# Patient Record
Sex: Female | Born: 1950 | Race: White | Hispanic: No | Smoking: Former smoker
Health system: Southern US, Community
[De-identification: ages and names within clinical notes are randomized; demographics above are authoritative.]

## PROBLEM LIST (undated history)

## (undated) DIAGNOSIS — C801 Malignant (primary) neoplasm, unspecified: Secondary | ICD-10-CM

## (undated) DIAGNOSIS — N2889 Other specified disorders of kidney and ureter: Secondary | ICD-10-CM

## (undated) DIAGNOSIS — I10 Essential (primary) hypertension: Secondary | ICD-10-CM

## (undated) DIAGNOSIS — E785 Hyperlipidemia, unspecified: Secondary | ICD-10-CM

## (undated) DIAGNOSIS — Z905 Acquired absence of kidney: Secondary | ICD-10-CM

## (undated) DIAGNOSIS — N289 Disorder of kidney and ureter, unspecified: Secondary | ICD-10-CM

## (undated) DIAGNOSIS — G6181 Chronic inflammatory demyelinating polyneuritis: Secondary | ICD-10-CM

## (undated) DIAGNOSIS — G610 Guillain-Barre syndrome: Secondary | ICD-10-CM

## (undated) HISTORY — DX: Malignant (primary) neoplasm, unspecified: C80.1

## (undated) HISTORY — DX: Hyperlipidemia, unspecified: E78.5

## (undated) HISTORY — DX: Essential (primary) hypertension: I10

## (undated) HISTORY — PX: NEPHRECTOMY: SHX65

## (undated) HISTORY — DX: Chronic inflammatory demyelinating polyneuritis: G61.81

## (undated) HISTORY — DX: Guillain-Barre syndrome: G61.0

## (undated) HISTORY — DX: Acquired absence of kidney: Z90.5

## (undated) HISTORY — DX: Other specified disorders of kidney and ureter: N28.89

## (undated) HISTORY — PX: CHOLECYSTECTOMY: SHX55

---

## 2002-10-20 ENCOUNTER — Encounter: Admission: RE | Admit: 2002-10-20 | Discharge: 2003-01-18 | Payer: Self-pay | Admitting: Emergency Medicine

## 2008-02-18 ENCOUNTER — Emergency Department (HOSPITAL_COMMUNITY): Admission: EM | Admit: 2008-02-18 | Discharge: 2008-02-18 | Payer: Self-pay | Admitting: Emergency Medicine

## 2008-08-14 ENCOUNTER — Emergency Department (HOSPITAL_BASED_OUTPATIENT_CLINIC_OR_DEPARTMENT_OTHER): Admission: EM | Admit: 2008-08-14 | Discharge: 2008-08-15 | Payer: Self-pay | Admitting: Emergency Medicine

## 2009-10-03 ENCOUNTER — Encounter: Admission: RE | Admit: 2009-10-03 | Discharge: 2009-10-03 | Payer: Self-pay | Admitting: Orthopedic Surgery

## 2009-10-07 ENCOUNTER — Encounter: Payer: Self-pay | Admitting: Family Medicine

## 2009-10-19 ENCOUNTER — Ambulatory Visit: Payer: Self-pay | Admitting: Family Medicine

## 2009-10-19 DIAGNOSIS — Z780 Asymptomatic menopausal state: Secondary | ICD-10-CM | POA: Insufficient documentation

## 2009-10-19 DIAGNOSIS — R011 Cardiac murmur, unspecified: Secondary | ICD-10-CM

## 2009-10-19 DIAGNOSIS — E785 Hyperlipidemia, unspecified: Secondary | ICD-10-CM | POA: Insufficient documentation

## 2009-10-19 DIAGNOSIS — I10 Essential (primary) hypertension: Secondary | ICD-10-CM | POA: Insufficient documentation

## 2009-10-20 ENCOUNTER — Telehealth: Payer: Self-pay | Admitting: Family Medicine

## 2009-10-21 ENCOUNTER — Telehealth (INDEPENDENT_AMBULATORY_CARE_PROVIDER_SITE_OTHER): Payer: Self-pay | Admitting: *Deleted

## 2009-10-24 ENCOUNTER — Ambulatory Visit: Payer: Self-pay | Admitting: Family Medicine

## 2009-10-24 ENCOUNTER — Telehealth (INDEPENDENT_AMBULATORY_CARE_PROVIDER_SITE_OTHER): Payer: Self-pay | Admitting: *Deleted

## 2009-10-27 ENCOUNTER — Telehealth (INDEPENDENT_AMBULATORY_CARE_PROVIDER_SITE_OTHER): Payer: Self-pay | Admitting: *Deleted

## 2009-10-28 LAB — CONVERTED CEMR LAB
AST: 19 units/L (ref 0–37)
Alkaline Phosphatase: 70 units/L (ref 39–117)
BUN: 20 mg/dL (ref 6–23)
Basophils Absolute: 0 10*3/uL (ref 0.0–0.1)
Basophils Relative: 0.6 % (ref 0.0–3.0)
Bilirubin, Direct: 0.2 mg/dL (ref 0.0–0.3)
CO2: 28 meq/L (ref 19–32)
Chloride: 96 meq/L (ref 96–112)
Cholesterol: 215 mg/dL — ABNORMAL HIGH (ref 0–200)
Creatinine, Ser: 0.6 mg/dL (ref 0.4–1.2)
Eosinophils Absolute: 0.1 10*3/uL (ref 0.0–0.7)
Glucose, Bld: 187 mg/dL — ABNORMAL HIGH (ref 70–99)
HCT: 38.8 % (ref 36.0–46.0)
Hemoglobin: 13.7 g/dL (ref 12.0–15.0)
Lymphs Abs: 2.4 10*3/uL (ref 0.7–4.0)
MCHC: 35.4 g/dL (ref 30.0–36.0)
MCV: 93.2 fL (ref 78.0–100.0)
Neutro Abs: 5.1 10*3/uL (ref 1.4–7.7)
RBC: 4.16 M/uL (ref 3.87–5.11)
RDW: 13.4 % (ref 11.5–14.6)
Total Bilirubin: 1 mg/dL (ref 0.3–1.2)
Total CHOL/HDL Ratio: 5

## 2009-11-07 ENCOUNTER — Encounter: Payer: Self-pay | Admitting: Family Medicine

## 2009-11-07 ENCOUNTER — Ambulatory Visit (HOSPITAL_COMMUNITY): Admission: RE | Admit: 2009-11-07 | Discharge: 2009-11-07 | Payer: Self-pay | Admitting: Family Medicine

## 2009-11-07 ENCOUNTER — Ambulatory Visit: Payer: Self-pay | Admitting: Internal Medicine

## 2009-11-07 ENCOUNTER — Ambulatory Visit: Payer: Self-pay

## 2009-11-16 ENCOUNTER — Encounter: Admission: RE | Admit: 2009-11-16 | Discharge: 2009-11-16 | Payer: Self-pay | Admitting: Urology

## 2009-11-16 ENCOUNTER — Encounter: Payer: Self-pay | Admitting: Family Medicine

## 2009-11-21 ENCOUNTER — Ambulatory Visit (HOSPITAL_COMMUNITY): Admission: RE | Admit: 2009-11-21 | Discharge: 2009-11-21 | Payer: Self-pay | Admitting: Interventional Radiology

## 2009-12-05 ENCOUNTER — Telehealth: Payer: Self-pay | Admitting: Family Medicine

## 2009-12-13 ENCOUNTER — Ambulatory Visit (HOSPITAL_COMMUNITY): Admission: RE | Admit: 2009-12-13 | Discharge: 2009-12-13 | Payer: Self-pay | Admitting: Urology

## 2009-12-13 ENCOUNTER — Encounter: Payer: Self-pay | Admitting: Family Medicine

## 2009-12-15 ENCOUNTER — Observation Stay (HOSPITAL_COMMUNITY): Admission: RE | Admit: 2009-12-15 | Discharge: 2009-12-16 | Payer: Self-pay | Admitting: Urology

## 2009-12-15 ENCOUNTER — Encounter (INDEPENDENT_AMBULATORY_CARE_PROVIDER_SITE_OTHER): Payer: Self-pay | Admitting: Urology

## 2010-01-02 ENCOUNTER — Encounter (INDEPENDENT_AMBULATORY_CARE_PROVIDER_SITE_OTHER): Payer: Self-pay | Admitting: Urology

## 2010-01-02 ENCOUNTER — Encounter: Payer: Self-pay | Admitting: Urology

## 2010-01-02 ENCOUNTER — Inpatient Hospital Stay (HOSPITAL_COMMUNITY): Admission: RE | Admit: 2010-01-02 | Discharge: 2010-01-14 | Payer: Self-pay | Admitting: Urology

## 2010-01-20 ENCOUNTER — Telehealth: Payer: Self-pay | Admitting: Family Medicine

## 2010-01-25 ENCOUNTER — Encounter: Payer: Self-pay | Admitting: Family Medicine

## 2010-01-25 ENCOUNTER — Telehealth: Payer: Self-pay | Admitting: Family Medicine

## 2010-01-26 ENCOUNTER — Ambulatory Visit: Payer: Self-pay | Admitting: Family Medicine

## 2010-01-26 DIAGNOSIS — R112 Nausea with vomiting, unspecified: Secondary | ICD-10-CM | POA: Insufficient documentation

## 2010-01-26 DIAGNOSIS — G589 Mononeuropathy, unspecified: Secondary | ICD-10-CM | POA: Insufficient documentation

## 2010-01-26 DIAGNOSIS — IMO0002 Reserved for concepts with insufficient information to code with codable children: Secondary | ICD-10-CM | POA: Insufficient documentation

## 2010-01-27 ENCOUNTER — Telehealth: Payer: Self-pay | Admitting: Family Medicine

## 2010-01-27 ENCOUNTER — Ambulatory Visit: Payer: Self-pay | Admitting: Family Medicine

## 2010-01-28 ENCOUNTER — Emergency Department (HOSPITAL_COMMUNITY)
Admission: EM | Admit: 2010-01-28 | Discharge: 2010-01-28 | Payer: Self-pay | Source: Home / Self Care | Admitting: Emergency Medicine

## 2010-01-28 ENCOUNTER — Inpatient Hospital Stay (HOSPITAL_COMMUNITY)
Admission: EM | Admit: 2010-01-28 | Discharge: 2010-02-09 | Payer: Self-pay | Attending: Pediatrics | Admitting: Pediatrics

## 2010-01-29 ENCOUNTER — Encounter (INDEPENDENT_AMBULATORY_CARE_PROVIDER_SITE_OTHER): Payer: Self-pay | Admitting: Neurology

## 2010-01-30 ENCOUNTER — Telehealth: Payer: Self-pay | Admitting: Family Medicine

## 2010-02-28 ENCOUNTER — Encounter: Payer: Self-pay | Admitting: Family Medicine

## 2010-03-05 ENCOUNTER — Encounter: Payer: Self-pay | Admitting: Family Medicine

## 2010-03-05 NOTE — Discharge Summary (Signed)
Nicole Strong, Nicole Strong                ACCOUNT NO.:  [id number]  MEDICAL RECORD NO.:  [id number]          PATIENT TYPE:  INP  LOCATION:  3001                         FACILITY:  [facility]  PHYSICIAN:  Princess Bruins. Hickling, M.D.DATE OF BIRTH:  08-11-1950  DATE OF ADMISSION:  01/28/2010 DATE OF DISCHARGE:  02/09/2010                              DISCHARGE SUMMARY   ADMITTING DIAGNOSES:  Bilateral upper and lower extremity weakness.  DISCHARGE DIAGNOSIS:  Guillain-Barre.  HISTORY OF PRESENT ILLNESS:  This is a 60 year old female who underwent a nephrectomy on January 02, 2010.  After the patient's nephrectomy, the patient reported that she was unable to keep down food and therefore was not eating.  After she returned having the ability to eat normally, the patient noted that she was experiencing numbness in the soles of her feet which over the next few days progressed up to her knees and bilateral hands.  At that time, there was no bowel or bladder dysfunction.  Initially, the patient was able to ambulate while holding on to objects at home, however, by the time the patient was admitted to the hospital, she was using a walker.  The patient was initially seen in the emergency department by Dr. Doy Mince; the on-call neurologist.  The patient was admitted to the hospital for further workup for possible Guillain-Barre.  PAST MEDICAL HISTORY:  Includes diabetes, renal cell carcinoma status post nephrectomy, resection was felt to be total, bladder lesion with the patient receiving chemotherapy within the bladder as a one-time treatment, anxiety, arthritis, depression, GERD, and hypertension.  ALLERGIES:  CODEINE, VICODIN, BIAXIN, REGLAN, and PREDNISONE.  HOSPITAL COURSE:  After the patient was admitted, the patient underwent MRI of brain which showed no acute infarct.  MRI of C-spine showing no focal course taking no abnormality or enhancement nor evidence of bony destructive lesions to  suggest presence of metastatic disease.  MRI of thoracic spine showed no focal cord signal abnormality or enhancement.  The patient then underwent a lumbar puncture to further diagnose the patient's Guillain-Barre.  Findings of lumbar puncture showed a glucose of 105, total protein of 106.  CSF culture was negative for any growth, negative for any fungal growth, and in addition a negative AFB with smear.  CSF color was clear with 4 white blood cells, 614 red blood cells in tube #1.  The patient at that time was started on plasma exchange for a total of 5 doses.  A plasmapheresis catheter was placed and the patient underwent 5 total doses of plasma exchange without any difficulty.  During hospital stay, the patient also noted that she was having some paresthesias and hypesthesias of her lower extremities including her ankle and soles of her feet and was started on Neurontin 100 mg t.i.d.  Today's date is February 09, 2010 and the patient has finished all 5 doses of plasma exchange.  The patient continues to feel as though she is weak in all four extremities and has decreased sensation from bilateral toes to bilateral knees, bilateral fingers up to bilateral wrists.  She also states she has decreased sensation from the T4 to approximately the  T10 region of her thoracic and abdomen region.  She feels as though this sensation has however improved.  PHYSICAL EXAMINATION:  At the time of discharge, the patient shows cranial nerves II through XII grossly intact.  Pupils are equal, round, and reactive to light.  Visual fields are grossly intact.  Speech is clear.  The patient shows 4/5 strength bilateral deltoid, bilateral biceps, and triceps.  Bilateral wrist extension and flexion, grip shows 4+ strength bilaterally.  Hip flexion bilaterally is at 3/5 and a 4/5 hip abduction and adduction.  Knee extension on the right is 4+/5, on the left it is approximately 4-/5.  Dorsiflexion and plantar  flexion on the right are 4/5, on the left 4-/5.  Finger-to-nose smooth, heel-to- shin was smooth, but was difficult to obtain due to the patient's weakness.  The patient states that she has paresthesias bilateral plantar surfaces of her feet, dorsum of her feet extending to her knees. She also states decreased sensation from the region of T4 to approximately T10.  In addition, decreased sensation in bilateral fingers to bilateral wrists.  The patient has 1+ reflexes at the bilateral biceps, triceps and brachioradialis.  She is areflexic at her patella tendon and Achilles.  DISCHARGE LABS:  Last CBC was done on December 25 showing white blood cell count of 9.2, hemoglobin 9.8, hematocrit 28.6, platelet of 252. Sodium 136, potassium 4.1, chloride of 104, CO2 of 25, glucose 146, BUN 21, creatinine 0.90.  IMAGING:  As stated above.  DISCHARGE MEDICATIONS: 1. Triamterene and hydrochlorothiazide 37.5/25 mg 1 tablet q.a.m. 2. Lexapro 10 mg 1 tablet p.o. q.a.m. 3. Evista 60 mg 1 tablet p.o. q.a.m. 4. Simvastatin 20 mg 1 tablet p.o. q.a.m. 5. Calcium over-the-counter 2 tablets p.o. q.a.m. 6. Neurontin 100 mg p.o. t.i.d. 7. Nu-Iron 150 mg daily.  FOLLOW UP:  The patient is to followup at [facility] in approximately 2-3 weeks for further evaluation.     Etta Quill, PA-C   ______________________________ Princess Bruins. Gaynell Face, M.D.    DS/MEDQ  D:  02/09/2010  T:  02/09/2010  Job:  [id number]  Electronically Signed by Etta Quill PA-C on 02/14/2010 02:55:01 PM Electronically Signed by Wyline Copas M.D. on 03/05/2010 11:47:31 PM

## 2010-03-13 DIAGNOSIS — G6181 Chronic inflammatory demyelinating polyneuritis: Secondary | ICD-10-CM | POA: Insufficient documentation

## 2010-03-14 NOTE — Letter (Signed)
Summary: [facility]  [facility]   Imported By: Edmonia James 10/18/2009 12:59:57  _____________________________________________________________________  External Attachment:    Type:   Image     Comment:   External Document

## 2010-03-14 NOTE — Progress Notes (Signed)
Summary: numbness in feet and hand  Phone Note Call from Patient Call back at Home Phone [phone]   Caller: Patient Summary of Call: Pt states that she was in [facility] for nephrectomy on left side. Pt now c/o constant numbness in feet and right index, 3rd, and 4th fingers. Pt states that she has only been home for a few days and has contacted surgeon about issue today. Pt is waiting on return call from them but was also advise to make PCP aware of the situation. Pt denies any other symptoms ..........................Marland KitchenFelecia Deloach CMA  January 20, 2010 8:43 AM   Follow-up for Phone Call        It could be from something that occurred during surgery---esp numbness in fingers but if surgeon says it is unrelated---she would need ov here. Follow-up by: Garnet Koyanagi DO,  January 20, 2010 8:53 AM  Additional Follow-up for Phone Call Additional follow up Details #1::        pt aware will call back when she hears from surgeon. Additional Follow-up by: Aron Baba CMA Deborra Medina),  January 20, 2010 8:57 AM

## 2010-03-14 NOTE — Assessment & Plan Note (Signed)
Summary: NEW TO ESTAB/CBS   Vital Signs:  Patient profile:   60 year old female Menstrual status:  postmenopausal Height:      61 inches Weight:      197.4 pounds BMI:     37.43 Temp:     98.9 degrees F oral BP sitting:   138 / 74  (left arm)  Vitals Entered By: Aron Baba CMA Deborra Medina) (October 19, 2009 10:11 AM) CC: CPX/Not Fasting, questions about DM management/ no pap needed     Menstrual Status postmenopausal   History of Present Illness: Pt here to establish care and have cpe. [facility] and is being worked up by urology for renal mass.    Type 1 diabetes mellitus follow-up      This is a 60 year old woman who presents with Type 2 diabetes mellitus follow-up.  The patient denies polyuria, polydipsia, blurred vision, self managed hypoglycemia, hypoglycemia requiring help, weight loss, weight gain, and numbness of extremities.  Other symptoms include neuropathic pain.  The patient denies the following symptoms: chest pain, vomiting, orthostatic symptoms, poor wound healing, intermittent claudication, vision loss, and foot ulcer.  Since the last visit the patient reports poor dietary compliance, noncompliance with medications, not exercising regularly, and not monitoring blood glucose.  Since the last visit, the patient reports having had eye care by an ophthalmologist and foot care by a podiatrist.  Complications from diabetes include peripheral neuropathy.    Preventive Screening-Counseling & Management  Alcohol-Tobacco     Alcohol drinks/day: 0     Smoking Status: quit     Packs/Day: 0.5     Year Started: 1968     Year Quit: 2006  Caffeine-Diet-Exercise     Caffeine use/day: 0     Does Patient Exercise: no  Hep-HIV-STD-Contraception     Dental Visit-last 6 months no     Dental Care Counseling: to seek dental care; no dental care within six months     SBE monthly: no      Sexual History:  divorced.        Drug Use:  never and no.    Current  Medications (verified): 1)  Lisinopril-Hydrochlorothiazide 20-12.5 Mg Tabs (Lisinopril-Hydrochlorothiazide) .Marland Kitchen.. 1 By Mouth At Night 2)  Diclofenac Sodium 50 Mg Tbec (Diclofenac Sodium) .Marland Kitchen.. 1 By Mouth Two Times A Day With Food 3)  Janumet 50-500 Mg Tabs (Sitagliptin-Metformin Hcl) .Marland Kitchen.. 1 By Mouth Two Times A Day 4)  Ambien 5 Mg Tabs (Zolpidem Tartrate) .Marland Kitchen.. 1 By Mouth At Bedtime As Needed  Allergies (verified): 1)  ! Codeine 2)  ! Vicodin  Past History:  Family History: Last updated: 10/19/2009 Sister had Breast CA Father had Prostate Ca Family History Breast cancer 1st degree relative <50 Family History of Prostate CA 1st degree relative <50  Social History: Last updated: 10/19/2009 Occupation:  time Estate agent service Divorced Former Smoker Alcohol use-no Drug use-no Regular exercise-no  Risk Factors: Alcohol Use: 0 (10/19/2009) Caffeine Use: 0 (10/19/2009) Exercise: no (10/19/2009)  Risk Factors: Smoking Status: quit (10/19/2009) Packs/Day: 0.5 (10/19/2009)  Past Medical History: Diabetes mellitus, type II Hyperlipidemia Hypertension renal mass--- being worked up by urology  Past Surgical History: 1983 C-section 1974 Gall bladder removal  Family History: Reviewed history and no changes required. Sister had Breast CA Father had Prostate Ca Family History Breast cancer 1st degree relative <50 Family History of Prostate CA 1st degree relative <50  Social History: Reviewed history and no changes required. Occupation:  time Estate agent service Divorced Former Smoker Alcohol use-no Drug use-no Regular exercise-no Occupation:  employed Smoking Status:  quit Drug Use:  never, no Does Patient Exercise:  no Packs/Day:  0.5 Caffeine use/day:  0 Dental Care w/in 6 mos.:  no Sexual History:  divorced  Review of Systems      See HPI General:  Denies chills, fatigue, fever, loss of appetite, malaise, sleep disorder, sweats, weakness, and weight  loss. Eyes:  Denies blurring, discharge, double vision, eye irritation, eye pain, halos, itching, light sensitivity, red eye, vision loss-1 eye, and vision loss-both eyes. ENT:  Denies decreased hearing, difficulty swallowing, ear discharge, earache, hoarseness, nasal congestion, nosebleeds, postnasal drainage, ringing in ears, sinus pressure, and sore throat. CV:  Denies bluish discoloration of lips or nails, chest pain or discomfort, difficulty breathing at night, difficulty breathing while lying down, fainting, fatigue, leg cramps with exertion, lightheadness, near fainting, palpitations, shortness of breath with exertion, swelling of feet, swelling of hands, and weight gain. Resp:  Denies chest discomfort, chest pain with inspiration, cough, coughing up blood, excessive snoring, hypersomnolence, morning headaches, pleuritic, shortness of breath, sputum productive, and wheezing. GI:  Denies abdominal pain, bloody stools, change in bowel habits, constipation, dark tarry stools, diarrhea, excessive appetite, gas, hemorrhoids, indigestion, loss of appetite, nausea, vomiting, vomiting blood, and yellowish skin color. GU:  Denies abnormal vaginal bleeding, decreased libido, discharge, dysuria, genital sores, hematuria, incontinence, nocturia, urinary frequency, and urinary hesitancy. MS:  Denies joint pain, joint redness, joint swelling, loss of strength, low back pain, mid back pain, muscle aches, muscle , cramps, muscle weakness, stiffness, and thoracic pain. Derm:  Denies changes in color of skin, changes in nail beds, dryness, excessive perspiration, flushing, hair loss, insect bite(s), itching, lesion(s), poor wound healing, and rash. Neuro:  Denies brief paralysis, difficulty with concentration, disturbances in coordination, falling down, headaches, inability to speak, memory loss, numbness, poor balance, seizures, sensation of room spinning, tingling, tremors, visual disturbances, and weakness. Psych:   Denies alternate hallucination ( auditory/visual), anxiety, depression, easily angered, easily tearful, irritability, mental problems, panic attacks, sense of great danger, suicidal thoughts/plans, thoughts of violence, unusual visions or sounds, and thoughts /plans of harming others. Endo:  Denies cold intolerance, excessive hunger, excessive thirst, excessive urination, heat intolerance, polyuria, and weight change. Heme:  Denies abnormal bruising, bleeding, enlarge lymph nodes, fevers, pallor, and skin discoloration. Allergy:  Denies hives or rash, itching eyes, persistent infections, seasonal allergies, and sneezing.  Physical Exam  General:  Well-developed,well-nourished,in no acute distress; alert,appropriate and cooperative throughout examinationoverweight-appearing.   Head:  Normocephalic and atraumatic without obvious abnormalities. No apparent alopecia or balding. Eyes:  vision grossly intact, pupils equal, pupils round, pupils reactive to light, and no injection.   Ears:  External ear exam shows no significant lesions or deformities.  Otoscopic examination reveals clear canals, tympanic membranes are intact bilaterally without bulging, retraction, inflammation or discharge. Hearing is grossly normal bilaterally. Nose:  External nasal examination shows no deformity or inflammation. Nasal mucosa are pink and moist without lesions or exudates. Mouth:  Oral mucosa and oropharynx without lesions or exudates.  Teeth in good repair. Neck:  No deformities, masses, or tenderness noted.no carotid bruits.   Chest Wall:  No deformities, masses, or tenderness noted. Breasts:  No mass, nodules, thickening, tenderness, bulging, retraction, inflamation, nipple discharge or skin changes noted.   Lungs:  Normal respiratory effort, chest expands symmetrically. Lungs are clear to auscultation, no crackles or wheezes. Heart:  normal rate and Grade  2 /6 systolic ejection murmur.   Abdomen:  Bowel sounds  positive,abdomen soft and non-tender without masses, organomegaly or hernias noted. Rectal:  gyn Genitalia:  gyn Msk:  normal ROM, no joint tenderness, no joint swelling, no joint warmth, no redness over joints, no joint deformities, no joint instability, and no crepitation.   Pulses:  R posterior tibial normal, R dorsalis pedis normal, R carotid normal, L posterior tibial normal, L dorsalis pedis normal, and L carotid normal.   Extremities:  No clubbing, cyanosis, edema, or deformity noted with normal full range of motion of all joints.   Neurologic:  No cranial nerve deficits noted. Station and gait are normal. Plantar reflexes are down-going bilaterally. DTRs are symmetrical throughout. Sensory, motor and coordinative functions appear intact. Skin:  Intact without suspicious lesions or rashes Cervical Nodes:  No lymphadenopathy noted Axillary Nodes:  No palpable lymphadenopathy Psych:  Cognition and judgment appear intact. Alert and cooperative with normal attention span and concentration. No apparent delusions, illusions, hallucinations  Diabetes Management Exam:    Foot Exam (with socks and/or shoes not present):       Sensory-Pinprick/Light touch:          Left medial foot (L-4): normal          Left dorsal foot (L-5): normal          Left lateral foot (S-1): normal          Right medial foot (L-4): normal          Right dorsal foot (L-5): normal          Right lateral foot (S-1): normal       Sensory-Monofilament:          Left foot: normal          Right foot: normal       Inspection:          Left foot: normal          Right foot: normal       Nails:          Left foot: normal          Right foot: normal    Eye Exam:       Eye Exam done elsewhere   Impression & Recommendations:  Problem # 1:  PREVENTIVE HEALTH CARE (ICD-V70.0)  Orders: Gastroenterology Referral (GI) Radiology Referral (Radiology) EKG w/ Interpretation (93000)  Problem # 2:  POSTMENOPAUSAL STATUS  (ICD-V49.81)  Orders: Radiology Referral (Radiology) EKG w/ Interpretation (93000)  Problem # 3:  CARDIAC MURMUR (ICD-785.2)  Orders: Echo Referral (Echo) EKG w/ Interpretation (93000)  Problem # 4:  HYPERTENSION (ICD-401.9)  Her updated medication list for this problem includes:    Lisinopril-hydrochlorothiazide 20-12.5 Mg Tabs (Lisinopril-hydrochlorothiazide) .Marland Kitchen... 1 by mouth at night  Orders: Echo Referral (Echo) EKG w/ Interpretation (93000)  Problem # 5:  HYPERLIPIDEMIA (ICD-272.4)  Orders: EKG w/ Interpretation (93000)  Problem # 6:  DIABETES MELLITUS, TYPE II (ICD-250.00)  Her updated medication list for this problem includes:    Lisinopril-hydrochlorothiazide 20-12.5 Mg Tabs (Lisinopril-hydrochlorothiazide) .Marland Kitchen... 1 by mouth at night    Janumet 50-500 Mg Tabs (Sitagliptin-metformin hcl) .Marland Kitchen... 1 by mouth two times a day  Orders: EKG w/ Interpretation (93000)  Problem # 7:  CARDIAC MURMUR (ICD-785.2)  Orders: Echo Referral (Echo) EKG w/ Interpretation (93000)  Complete Medication List: 1)  Lisinopril-hydrochlorothiazide 20-12.5 Mg Tabs (Lisinopril-hydrochlorothiazide) .Marland Kitchen.. 1 by mouth at night 2)  Diclofenac Sodium 50 Mg Tbec (Diclofenac sodium) .Marland KitchenMarland KitchenMarland Kitchen  1 by mouth two times a day with food 3)  Janumet 50-500 Mg Tabs (Sitagliptin-metformin hcl) .Marland Kitchen.. 1 by mouth two times a day 4)  Ambien 5 Mg Tabs (Zolpidem tartrate) .Marland Kitchen.. 1 by mouth at bedtime as needed 5)  Freestyle Lancets Misc (Lancets) 6)  Freestyle Lite Test Strp (Glucose blood) .... Accu check two times a day  Other Orders: Tdap => 46yrs IM [id number]) Admin 1st Vaccine [phone])  Patient Instructions: 1)  V70.0  250.00  bmp, lipid, hep , hgba1c ,   TSH, cbcd, microalbumin,  733.1  vita D,   UA Prescriptions: FREESTYLE LITE TEST  STRP (GLUCOSE BLOOD) accu check two times a day  #60 x 5   Entered and Authorized by:   Garnet Koyanagi DO   Signed by:   Garnet Koyanagi DO on 10/19/2009   Method used:   Historical    [id number] AMBIEN 5 MG TABS (ZOLPIDEM TARTRATE) 1 by mouth at bedtime as needed  #15 x 0   Entered and Authorized by:   Garnet Koyanagi DO   Signed by:   Garnet Koyanagi DO on 10/19/2009   Method used:   Print then Give to Patient   RxID:   [id number] JANUMET 50-500 MG TABS (SITAGLIPTIN-METFORMIN HCL) 1 by mouth two times a day  #60 x 2   Entered and Authorized by:   Garnet Koyanagi DO   Signed by:   Garnet Koyanagi DO on 10/19/2009   Method used:   Historical   [id number]    EKG  Procedure date:  10/19/2009  Findings:      Normal sinus rhythm with rate of:  93       Immunizations Administered:  Tetanus Vaccine:    Vaccine Type: Tdap    Site: left deltoid    Mfr: Merck    Dose: 0.5 ml    Route: IM    Given by: Aron Baba CMA ([place])    Exp. Date: 11/03/2011    Lot #: [id number]    VIS given: 12/31/06 version given October 19, 2009.

## 2010-03-14 NOTE — Letter (Signed)
Summary: [facility]  [facility]   Imported By: Edmonia James 12/29/2009 10:16:06  _____________________________________________________________________  External Attachment:    Type:   Image     Comment:   External Document

## 2010-03-14 NOTE — Letter (Signed)
Summary: [facility]   Imported By: Edmonia James 12/02/2009 11:46:03  _____________________________________________________________________  External Attachment:    Type:   Image     Comment:   External Document

## 2010-03-14 NOTE — Progress Notes (Signed)
Summary: Results (lmom 9/15, 9/16x2  Phone Note Outgoing Call Call back at [facility] Phone [phone]   Details for Reason: Results Summary of Call: ow vita D----  take vita D 50000 u weekly  and take otc vita D3 2000u daily recheck 3 months  Left message to call back Initial call taken by: Aron Baba CMA Deborra Medina),  October 27, 2009 11:32 AM  Follow-up for Phone Call        [email or web address] Breslow  October 28, 2009 10:35 AM  spoke w/ patient aware of labs and ask about other labs informed that overall cholesterol was elevated and that cholesterol med may be started but would have to wait until Dr. Etter Sjogren to review says that if she is started on cholesterol med that she would like to be started on Welchol to help w/ diarrhea caused by medication and having no gallbladder and since she prefers not to be on any statins would like to know something before the weekend .......Marland KitchenMalachi Bonds CMA  October 28, 2009 2:35 PM   Additional Follow-up for Phone Call Additional follow up Details #1::        see labs DM not controlled but pt just started on janumet. Ideally your LDL (bad cholesterol) should be <__70__, your HDL (good cholesterol) should be >__40_ and your triglycerides should be< 150.  Diet and exercise will increase HDL and decrease the LDL and triglycerides. Read Dr. Langston Masker book--Eat Drink and Be Healthy.  Start welchol tabs 3 by mouth two times a day.  #120  2 refills.    We will recheck labs in __3_ months.  272.4  250.00  hgba1c,  bmp, lipid, hep  Additional Follow-up by: Garnet Koyanagi DO,  October 28, 2009 3:43 PM    Additional Follow-up for Phone Call Additional follow up Details #2::    left message on machine ....Marland KitchenMarland KitchenMalachi Bonds [facility]  October 28, 2009 4:14 PM   Additional Follow-up for Phone Call Additional follow up Details #3:: Details for Additional Follow-up Action Taken: spk with pt adv of rx. called to [facility] on [address]. Aron Baba CMA Deborra Medina)  October 28, 2009 4:31 PM' Additional Follow-up by: Aron Baba CMA Deborra Medina),  October 28, 2009 4:32 PM  New/Updated Medications: VITAMIN D (ERGOCALCIFEROL) 50000 UNIT CAPS (ERGOCALCIFEROL) take one tablet weekly WELCHOL 625 MG TABS (COLESEVELAM HCL) 3 tabs by mouth twice a day Prescriptions: WELCHOL 625 MG TABS (COLESEVELAM HCL) 3 tabs by mouth twice a day  #120 x 2   Entered by:   Aron Baba CMA ([place])   Authorized by:   Garnet Koyanagi DO   Signed by:   Aron Baba CMA ([place]) on 10/28/2009   Method used:   Faxed to ...       [facility]. [phone]* (retail)       [address]       [place], [place]  [address]       Ph: [phone]       Fax: [phone]   RxID:   [id number] VITAMIN D (ERGOCALCIFEROL) 50000 UNIT CAPS (ERGOCALCIFEROL) take one tablet weekly  #12 x 0   Entered by:   Malachi Bonds CMA   Authorized by:   Garnet Koyanagi DO   Signed by:   Malachi Bonds CMA on 10/28/2009   Method used:   Electronically to        [facility]. [phone]* (retail)  [address]       [place], [place]  [address]       Ph: [phone]       Fax: [phone]   RxID:   [id number]

## 2010-03-14 NOTE — Progress Notes (Signed)
Summary: Ambien refill  Phone Note Refill Request Message from:  Fax from Pharmacy on December 05, 2009 9:35 AM  Refills Requested: Medication #1:  AMBIEN 5 MG TABS 1 by mouth at bedtime as needed [facility] [address] - pharmacy didnt receive refill   Initial call taken by: Arbie Co[place],  December 05, 2009 9:35 AM  Follow-up for Phone Call        last filled and seen 10/19/09, Please advise...Marland KitchenMarland KitchenMarland Kitchen Aron Baba CMA Deborra Medina)  December 05, 2009 5:02 PM   Additional Follow-up for Phone Call Additional follow up Details #1::        ok to send refill x1 Additional Follow-up by: Garnet Koyanagi DO,  December 06, 2009 10:50 AM    Prescriptions: AMBIEN 5 MG TABS (ZOLPIDEM TARTRATE) 1 by mouth at bedtime as needed  #15 x 0   Entered by:   Aron Baba CMA ([place])   Authorized by:   Garnet Koyanagi DO   Signed by:   Aron Baba CMA ([place]) on 12/06/2009   Method used:   Printed then faxed to ...       [facility]. [phone]* (retail)       [address]       [place], [place]  [address]       Ph: [phone]       Fax: [phone]   RxID:   [id number]

## 2010-03-14 NOTE — Progress Notes (Signed)
Summary: GASTRO REFERRAL  Phone Note Outgoing Call   Call placed by: Phylliss Bob [facility],  October 20, 2009 9:31 AM Call placed to: Patient Summary of Call: Dare, PER MY PHONE CALL TO PATIENT, SHE HAS DECLINED THIS REFERRAL AT THIS TIME, STATES SHE WILL LET us KNOW WHEN SHE IS READY TO SCHEDULE. Initial call taken by: Phylliss Bob [facility],  October 20, 2009 9:32 AM     Colonoscopy Result Date:  10/19/2009 Colonoscopy Result:  refused

## 2010-03-14 NOTE — Progress Notes (Signed)
Summary: freestyle strips and lancets  Phone Note Refill Request Message from:  Patient on October 24, 2009 8:55 AM  Refills Requested: Medication #1:  FREESTYLE LITE TEST  STRP accu check two times a day.  Medication #2:  FREESTYLE LANCETS  MISC [facility] [address] [facility] [address]   Initial call taken by: Arbie Co[place],  October 24, 2009 8:56 AM    New/Updated Medications: FREESTYLE LANCETS  MISC (LANCETS) accu check two times a day Prescriptions: FREESTYLE LITE TEST  STRP (GLUCOSE BLOOD) accu check two times a day  #60 x 5   Entered by:   Malachi Bonds CMA   Authorized by:   Garnet Koyanagi DO   Signed by:   Malachi Bonds CMA on 10/24/2009   Method used:   Electronically to        [facility]. [phone]* (retail)       [address]       [place], [place]  [address]       Ph: [phone]       Fax: [phone]   RxID:   [id number] FREESTYLE LANCETS  MISC (LANCETS) accu check two times a day  #60 x 5   Entered by:   Malachi Bonds CMA   Authorized by:   Garnet Koyanagi DO   Signed by:   Malachi Bonds CMA on 10/24/2009   Method used:   Electronically to        [facility]. [phone]* (retail)       [address]       [place], [place]  [address]       Ph: [phone]       Fax: [phone]   RxID:   [id number]

## 2010-03-14 NOTE — Progress Notes (Signed)
Summary: LISINOPRIL-HCTZ REFILL  Phone Note Refill Request Message from:  Fax from Pharmacy  Refills Requested: Medication #1:  LISINOPRIL-HYDROCHLOROTHIAZIDE 20-12.5 MG TABS 1 by mouth at night   Last Refilled: 09/12/2009 [facility] [address] [address]  [phone]    QTY = 30  Initial call taken by: Berneta Sages,  October 21, 2009 9:28 AM  Follow-up for Phone Call        Last OV 10/19/09 [place] pt, Rx never filled by you. Please advise Follow-up by: Aron Baba CMA Deborra Medina),  October 21, 2009 12:10 PM  Additional Follow-up for Phone Call Additional follow up Details #1::        refill for 3 months Additional Follow-up by: Garnet Koyanagi DO,  October 21, 2009 12:55 PM    Prescriptions: LISINOPRIL-HYDROCHLOROTHIAZIDE 20-12.5 MG TABS (LISINOPRIL-HYDROCHLOROTHIAZIDE) 1 by mouth at night  #30 x 3   Entered by:   Malachi Bonds CMA   Authorized by:   Garnet Koyanagi DO   Signed by:   Malachi Bonds CMA on 10/21/2009   Method used:   Electronically to        [facility]. [phone]* (retail)       [address]       [place], [place]  [address]       Ph: [phone]       Fax: [phone]   RxID:   [id number]

## 2010-03-15 ENCOUNTER — Encounter: Payer: Self-pay | Admitting: Family Medicine

## 2010-03-16 NOTE — Letter (Signed)
Summary: [facility]  [facility]   Imported By: Edmonia James 02/03/2010 12:46:23  _____________________________________________________________________  External Attachment:    Type:   Image     Comment:   External Document

## 2010-03-16 NOTE — Progress Notes (Signed)
Summary: [facility] Patient Discuss Patient care  Phone Note From Other Clinic Call back at [phone] ext [phone] Caren Griffins or Nira Conn)   Caller: Dr Alinda Money [facility]) Summary of Call: Would like a call to discuss care of mutual patient................Marland KitchenFelecia Deloach CMA  January 25, 2010 2:40 PM  heather ext [phone] or Caren Griffins ext [phone]  Left detail message on Dr Alinda Money nurse(Cynthia) VM that dr Etter Sjogren out of office this afternoon and will not return until AM, however if it is a urgent matter he can give [place] a call back and speak with one of her colleague, otherwise I will forward to Dr Etter Sjogren so that she can address it on tomorrow.......Marland KitchenFelecia Deloach CMA  January 25, 2010 3:00 PM   Follow-up for Phone Call        Dr Alinda Money call back stating that he saw Pt today and does not feel that her symptoms are related to surgery. He suggests that Pt needs to be seen this week for increase in neuropathy.  Advised Dr Alinda Money will forward info to Dr Etter Sjogren and will be in touch if any further info is needed from him. Pt has pending OV schedule for Friday with dr Etter Sjogren.Marland KitchenMarland KitchenMarland KitchenFelecia Deloach CMA  January 25, 2010 3:56 PM

## 2010-03-16 NOTE — Progress Notes (Signed)
Summary: referral  Phone Note Call from Patient Call back at Home Phone [phone]   Caller: Patient Summary of Call: Pt states that she was seen by Ortho on yesterday and they informed her for the issue she is having she would need to see neurology. Pt states that she has already contacted them and was advised that she needed a referral and they are scheduling for March. Pt states that she can not wait until March she needs to be seen sooner. Pls Advise.............Marland KitchenFelecia Deloach CMA  January 27, 2010 2:26 PM   Follow-up for Phone Call        we will work on it Follow-up by: Garnet Koyanagi DO,  January 27, 2010 2:39 PM  Additional Follow-up for Phone Call Additional follow up Details #1::        Patient referred to [facility], she will call me for an ASAP appt for pt as soon as she has a cancellation. Phylliss Bob [facility]  January 27, 2010 2:49 PM     Additional Follow-up for Phone Call Additional follow up Details #2::    Please let pt know Follow-up by: Garnet Koyanagi DO,  January 27, 2010 2:59 PM  Additional Follow-up for Phone Call Additional follow up Details #3:: Details for Additional Follow-up Action Taken: Pt aware awaiting appt info.......Marland KitchenFelecia Deloach CMA  January 27, 2010 3:57 PM

## 2010-03-16 NOTE — Assessment & Plan Note (Signed)
Summary: nausea/cbs   Vital Signs:  Patient profile:   60 year old female Menstrual status:  postmenopausal Weight:      188.0 pounds Temp:     98.0 degrees F oral Pulse rate:   76 / minute Pulse rhythm:   regular BP sitting:   120 / 72  (left arm) Cuff size:   regular  Vitals Entered By: Aron Baba CMA Deborra Medina) (January 26, 2010 11:43 AM) CC: c/o nausea and vomiting since this morning   History of Present Illness: Pt here c/o n/v since this am.   Last episode vomiting 9am but she is still nauseous.  Pt is doing well from nephrectomy and has no abd pain.   Pt fell secondary to increasing neuropathy in LE---she can't feel her feet  and she now has numbness in hands as well.  Pt was seeing Dr Eddie Dibbles for her back when they found tumor on kidney so she has not been back since.    Current Medications (verified): 1)  Lisinopril-Hydrochlorothiazide 20-12.5 Mg Tabs (Lisinopril-Hydrochlorothiazide) .Marland Kitchen.. 1 By Mouth At Night 2)  Diclofenac Sodium 50 Mg Tbec (Diclofenac Sodium) .Marland Kitchen.. 1 By Mouth Two Times A Day With Food 3)  Janumet 50-500 Mg Tabs (Sitagliptin-Metformin Hcl) .Marland Kitchen.. 1 By Mouth Two Times A Day 4)  Ambien 5 Mg Tabs (Zolpidem Tartrate) .Marland Kitchen.. 1 By Mouth At Bedtime As Needed 5)  Freestyle Lancets  Misc (Lancets) .... Accu Check Two Times A Day 6)  Freestyle Lite Test  Strp (Glucose Blood) .... Accu Check Two Times A Day 7)  Welchol 625 Mg Tabs (Colesevelam Hcl) .... 3 Tabs By Mouth Twice A Day 8)  Urogesic-Blue 81.6 Mg Tabs (Methen-Hyosc-Meth Blue-Na Phos) .Marland Kitchen.. 1 By Mouth Four Times A Day 9)  Promethazine Hcl 25 Mg Tabs (Promethazine Hcl) .Marland Kitchen.. 1 By Mouth Qid As Needed  Allergies (verified): 1)  ! Codeine 2)  ! Vicodin  Past History:  Past Medical History: Last updated: 10/19/2009 Diabetes mellitus, type II Hyperlipidemia Hypertension renal mass--- being worked up by urology  Family History: Last updated: 10/19/2009 Sister had Breast CA Father had Prostate Ca Family  History Breast cancer 1st degree relative <50 Family History of Prostate CA 1st degree relative <50  Social History: Last updated: 10/19/2009 Occupation:  time Estate agent service Divorced Former Smoker Alcohol use-no Drug use-no Regular exercise-no  Risk Factors: Alcohol Use: 0 (10/19/2009) Caffeine Use: 0 (10/19/2009) Exercise: no (10/19/2009)  Risk Factors: Smoking Status: quit (10/19/2009) Packs/Day: 0.5 (10/19/2009)  Past Surgical History: 1983 C-section 1974 Gall bladder removal Nephrectomy Left  Family History: Reviewed history from 10/19/2009 and no changes required. Sister had Breast CA Father had Prostate Ca Family History Breast cancer 1st degree relative <50 Family History of Prostate CA 1st degree relative <50  Social History: Reviewed history from 10/19/2009 and no changes required. Occupation:  time Management consultant Divorced Former Smoker Alcohol use-no Drug use-no Regular exercise-no  Review of Systems      See HPI  Physical Exam  General:  Well-developed,well-nourished,in no acute distress; alert,appropriate and cooperative throughout examination Mouth:  Oral mucosa and oropharynx without lesions or exudates.  Teeth in good repair. Neck:  No deformities, masses, or tenderness noted. Lungs:  Normal respiratory effort, chest expands symmetrically. Lungs are clear to auscultation, no crackles or wheezes. Abdomen:  wound healing well  softno guarding and no rebound tenderness.   Neurologic:  R foot --4/5 weakness with dorsiflexion L foot --significan weakness with dorsi and plantar flexion.  Psych:  Oriented X3 and normally interactive.     Impression & Recommendations:  Problem # 1:  BACK PAIN, LUMBAR, WITH RADICULOPATHY (ICD-724.4)  worsening neuropathy--pt to see ortho today for evaluation Her updated medication list for this problem includes:    Diclofenac Sodium 50 Mg Tbec (Diclofenac sodium) .Marland Kitchen... 1 by mouth two times a  day with food  Discussed use of moist heat or ice, modified activities, medications, and stretching/strengthening exercises. Back care instructions given. To be seen in 2 weeks if no improvement; sooner if worsening of symptoms.   Problem # 2:  NAUSEA WITH VOMITING (ICD-787.01) improving phenergan rx to pharmacy if symptoms worsen pt may need to go to ER  Problem # 3:  NEUROPATHY (ICD-355.9)  Orders: Neurology Referral (Neuro)  Complete Medication List: 1)  Lisinopril-hydrochlorothiazide 20-12.5 Mg Tabs (Lisinopril-hydrochlorothiazide) .Marland Kitchen.. 1 by mouth at night 2)  Diclofenac Sodium 50 Mg Tbec (Diclofenac sodium) .Marland Kitchen.. 1 by mouth two times a day with food 3)  Janumet 50-500 Mg Tabs (Sitagliptin-metformin hcl) .Marland Kitchen.. 1 by mouth two times a day 4)  Ambien 5 Mg Tabs (Zolpidem tartrate) .Marland Kitchen.. 1 by mouth at bedtime as needed 5)  Freestyle Lancets Misc (Lancets) .... Accu check two times a day 6)  Freestyle Lite Test Strp (Glucose blood) .... Accu check two times a day 7)  Welchol 625 Mg Tabs (Colesevelam hcl) .... 3 tabs by mouth twice a day 8)  Urogesic-blue 81.6 Mg Tabs (Methen-hyosc-meth blue-na phos) .Marland Kitchen.. 1 by mouth four times a day 9)  Promethazine Hcl 25 Mg Tabs (Promethazine hcl) .Marland Kitchen.. 1 by mouth qid as needed Prescriptions: PROMETHAZINE HCL 25 MG TABS (PROMETHAZINE HCL) 1 by mouth qid as needed  #30 x 0   Entered and Authorized by:   Garnet Koyanagi DO   Signed by:   Garnet Koyanagi DO on 01/26/2010   Method used:   Electronically to        [facility]. [phone]* (retail)       [address]       [place], [place]  [address]       Ph: [phone]       Fax: [phone]   RxID:   [id number]    Orders Added: 1)  Neurology Referral [Neuro] 2)  Est. Patient Level III [id number]

## 2010-03-16 NOTE — Progress Notes (Signed)
Summary: fell on saturday  Phone Note Call from Patient Call back at Work Phone [phone]   Caller: Patient Summary of Call: Pt states that she fell on saturday morning and had to go to ED. Pt states that she has since been transfer to [facility]. Pt would like to let you that she has seen the neuro so we can cancel referral that we were trying to get. Pt would also like for [place] to call the ortho that we referred her to, to inform them as well. 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 Aron Baba CMA Deborra Medina)  January 30, 2010 12:10 PM

## 2010-03-22 NOTE — Letter (Signed)
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

## 2010-03-30 NOTE — Letter (Signed)
Summary: [facility]  [facility]   Imported By: Jamelle Haring 03/23/2010 11:06:59  _____________________________________________________________________  External Attachment:    Type:   Image     Comment:   External Document

## 2010-04-24 LAB — COMPREHENSIVE METABOLIC PANEL
ALT: 18 U/L (ref 0–35)
BUN: 20 mg/dL (ref 6–23)
CO2: 23 mEq/L (ref 19–32)
Calcium: 9.5 mg/dL (ref 8.4–10.5)
GFR calc non Af Amer: 56 mL/min — ABNORMAL LOW (ref >60)
Glucose, Bld: 180 mg/dL — ABNORMAL HIGH (ref 70–99)
Sodium: 130 mEq/L — ABNORMAL LOW (ref 135–145)

## 2010-04-24 LAB — FUNGUS CULTURE W SMEAR: Fungal Smear: NONE SEEN

## 2010-04-24 LAB — URINALYSIS, ROUTINE W REFLEX MICROSCOPIC
Ketones, ur: NEGATIVE mg/dL
Leukocytes, UA: NEGATIVE
Nitrite: NEGATIVE
Protein, ur: NEGATIVE mg/dL
Urobilinogen, UA: 0.2 mg/dL (ref 0.0–1.0)

## 2010-04-24 LAB — CBC
HCT: 30 % — ABNORMAL LOW (ref 36.0–46.0)
HCT: 31.8 % — ABNORMAL LOW (ref 36.0–46.0)
HCT: 34 % — ABNORMAL LOW (ref 36.0–46.0)
Hemoglobin: 10.3 g/dL — ABNORMAL LOW (ref 12.0–15.0)
Hemoglobin: 10.8 g/dL — ABNORMAL LOW (ref 12.0–15.0)
MCH: 30.7 pg (ref 26.0–34.0)
MCHC: 33.8 g/dL (ref 30.0–36.0)
MCHC: 34 g/dL (ref 30.0–36.0)
MCV: 89.5 fL (ref 78.0–100.0)
MCV: 89.6 fL (ref 78.0–100.0)
MCV: 90.3 fL (ref 78.0–100.0)
MCV: 90.5 fL (ref 78.0–100.0)
Platelets: 252 10*3/uL (ref 150–400)
Platelets: 332 10*3/uL (ref 150–400)
RBC: 3.16 MIL/uL — ABNORMAL LOW (ref 3.87–5.11)
RDW: 13.7 % (ref 11.5–15.5)
RDW: 13.7 % (ref 11.5–15.5)
RDW: 13.9 % (ref 11.5–15.5)
RDW: 14 % (ref 11.5–15.5)
WBC: 6.9 10*3/uL (ref 4.0–10.5)
WBC: 7.6 10*3/uL (ref 4.0–10.5)
WBC: 9.2 10*3/uL (ref 4.0–10.5)

## 2010-04-24 LAB — GLUCOSE, CAPILLARY
Glucose-Capillary: 121 mg/dL — ABNORMAL HIGH (ref 70–99)
Glucose-Capillary: 124 mg/dL — ABNORMAL HIGH (ref 70–99)
Glucose-Capillary: 130 mg/dL — ABNORMAL HIGH (ref 70–99)
Glucose-Capillary: 133 mg/dL — ABNORMAL HIGH (ref 70–99)
Glucose-Capillary: 134 mg/dL — ABNORMAL HIGH (ref 70–99)
Glucose-Capillary: 138 mg/dL — ABNORMAL HIGH (ref 70–99)
Glucose-Capillary: 139 mg/dL — ABNORMAL HIGH (ref 70–99)
Glucose-Capillary: 141 mg/dL — ABNORMAL HIGH (ref 70–99)
Glucose-Capillary: 143 mg/dL — ABNORMAL HIGH (ref 70–99)
Glucose-Capillary: 152 mg/dL — ABNORMAL HIGH (ref 70–99)
Glucose-Capillary: 158 mg/dL — ABNORMAL HIGH (ref 70–99)
Glucose-Capillary: 160 mg/dL — ABNORMAL HIGH (ref 70–99)
Glucose-Capillary: 161 mg/dL — ABNORMAL HIGH (ref 70–99)
Glucose-Capillary: 161 mg/dL — ABNORMAL HIGH (ref 70–99)
Glucose-Capillary: 162 mg/dL — ABNORMAL HIGH (ref 70–99)
Glucose-Capillary: 168 mg/dL — ABNORMAL HIGH (ref 70–99)
Glucose-Capillary: 169 mg/dL — ABNORMAL HIGH (ref 70–99)
Glucose-Capillary: 171 mg/dL — ABNORMAL HIGH (ref 70–99)
Glucose-Capillary: 179 mg/dL — ABNORMAL HIGH (ref 70–99)
Glucose-Capillary: 183 mg/dL — ABNORMAL HIGH (ref 70–99)
Glucose-Capillary: 212 mg/dL — ABNORMAL HIGH (ref 70–99)
Glucose-Capillary: 215 mg/dL — ABNORMAL HIGH (ref 70–99)

## 2010-04-24 LAB — URINE MICROSCOPIC-ADD ON

## 2010-04-24 LAB — TYPE AND SCREEN
ABO/RH(D): AB POS
Antibody Screen: NEGATIVE

## 2010-04-24 LAB — BASIC METABOLIC PANEL
BUN: 15 mg/dL (ref 6–23)
BUN: 21 mg/dL (ref 6–23)
Chloride: 100 mEq/L (ref 96–112)
Chloride: 104 mEq/L (ref 96–112)
Creatinine, Ser: 0.9 mg/dL (ref 0.4–1.2)
Creatinine, Ser: 0.98 mg/dL (ref 0.4–1.2)
GFR calc Af Amer: >60 mL/min (ref >60)
GFR calc non Af Amer: 58 mL/min — ABNORMAL LOW (ref >60)
GFR calc non Af Amer: >60 mL/min (ref >60)
Glucose, Bld: 179 mg/dL — ABNORMAL HIGH (ref 70–99)
Potassium: 4.1 mEq/L (ref 3.5–5.1)

## 2010-04-24 LAB — TSH: TSH: 1.929 u[IU]/mL (ref 0.350–4.500)

## 2010-04-24 LAB — AFB CULTURE WITH SMEAR (NOT AT ARMC): Acid Fast Smear: NONE SEEN

## 2010-04-24 LAB — DIFFERENTIAL
Basophils Absolute: 0 10*3/uL (ref 0.0–0.1)
Basophils Relative: 0 % (ref 0–1)
Eosinophils Absolute: 0.3 10*3/uL (ref 0.0–0.7)
Eosinophils Relative: 4 % (ref 0–5)
Lymphocytes Relative: 19 % (ref 12–46)
Lymphs Abs: 1.4 10*3/uL (ref 0.7–4.0)
Lymphs Abs: 1.5 10*3/uL (ref 0.7–4.0)
Monocytes Absolute: 0.6 10*3/uL (ref 0.1–1.0)
Monocytes Relative: 8 % (ref 3–12)
Neutro Abs: 4.6 10*3/uL (ref 1.7–7.7)
Neutrophils Relative %: 69 % (ref 43–77)

## 2010-04-24 LAB — CSF CELL COUNT WITH DIFFERENTIAL
RBC Count, CSF: 614 /mm3 — ABNORMAL HIGH
Tube #: 1
WBC, CSF: 4 /mm3 (ref 0–5)

## 2010-04-24 LAB — GLUCOSE, RANDOM: Glucose, Bld: 246 mg/dL — ABNORMAL HIGH (ref 70–99)

## 2010-04-24 LAB — PROTIME-INR
INR: 1.03 (ref 0.00–1.49)
Prothrombin Time: 13.7 seconds (ref 11.6–15.2)

## 2010-04-24 LAB — CSF CULTURE W GRAM STAIN: Culture: NO GROWTH

## 2010-04-24 LAB — APTT: aPTT: 38 seconds — ABNORMAL HIGH (ref 24–37)

## 2010-04-24 LAB — RETICULOCYTES
RBC.: 3.18 MIL/uL — ABNORMAL LOW (ref 3.87–5.11)
RBC.: 3.34 MIL/uL — ABNORMAL LOW (ref 3.87–5.11)
Retic Ct Pct: 2.4 % (ref 0.4–3.1)

## 2010-04-24 LAB — VITAMIN B12: Vitamin B-12: 309 pg/mL (ref 211–911)

## 2010-04-24 LAB — SEDIMENTATION RATE: Sed Rate: 77 mm/hr — ABNORMAL HIGH (ref 0–22)

## 2010-04-24 LAB — URINE CULTURE
Colony Count: NO GROWTH
Culture: NO GROWTH

## 2010-04-24 LAB — HEMOGLOBIN A1C: Mean Plasma Glucose: 134 mg/dL — ABNORMAL HIGH (ref <117)

## 2010-04-25 LAB — CBC
HCT: 29.5 % — ABNORMAL LOW (ref 36.0–46.0)
HCT: 27.5 % — ABNORMAL LOW (ref 36.0–46.0)
HCT: 29.7 % — ABNORMAL LOW (ref 36.0–46.0)
HCT: 36.8 % (ref 36.0–46.0)
Hemoglobin: 10.6 g/dL — ABNORMAL LOW (ref 12.0–15.0)
Hemoglobin: 9.4 g/dL — ABNORMAL LOW (ref 12.0–15.0)
MCH: 30.3 pg (ref 26.0–34.0)
MCH: 32.7 pg (ref 26.0–34.0)
MCHC: 34.2 g/dL (ref 30.0–36.0)
MCHC: 35.5 g/dL (ref 30.0–36.0)
MCHC: 35.6 g/dL (ref 30.0–36.0)
MCV: 88.7 fL (ref 78.0–100.0)
MCV: 91.5 fL (ref 78.0–100.0)
MCV: 91.8 fL (ref 78.0–100.0)
Platelets: 356 10*3/uL (ref 150–400)
Platelets: 340 10*3/uL (ref 150–400)
Platelets: 352 10*3/uL (ref 150–400)
RBC: 3.3 MIL/uL — ABNORMAL LOW (ref 3.87–5.11)
RBC: 3.1 MIL/uL — ABNORMAL LOW (ref 3.87–5.11)
RBC: 3.24 MIL/uL — ABNORMAL LOW (ref 3.87–5.11)
RDW: 13 % (ref 11.5–15.5)
RDW: 13.2 % (ref 11.5–15.5)
RDW: 13.5 % (ref 11.5–15.5)
RDW: 13.6 % (ref 11.5–15.5)
RDW: 13.9 % (ref 11.5–15.5)
WBC: 8.5 10*3/uL (ref 4.0–10.5)
WBC: 8.4 10*3/uL (ref 4.0–10.5)
WBC: 8.5 10*3/uL (ref 4.0–10.5)
WBC: 9.3 10*3/uL (ref 4.0–10.5)

## 2010-04-25 LAB — COMPREHENSIVE METABOLIC PANEL
ALT: 14 U/L (ref 0–35)
ALT: 17 U/L (ref 0–35)
ALT: 36 U/L — ABNORMAL HIGH (ref 0–35)
AST: 13 U/L (ref 0–37)
AST: 17 U/L (ref 0–37)
AST: 28 U/L (ref 0–37)
Albumin: 2.4 g/dL — ABNORMAL LOW (ref 3.5–5.2)
Albumin: 2.5 g/dL — ABNORMAL LOW (ref 3.5–5.2)
Albumin: 3.9 g/dL (ref 3.5–5.2)
Alkaline Phosphatase: 72 U/L (ref 39–117)
Alkaline Phosphatase: 73 U/L (ref 39–117)
Alkaline Phosphatase: 77 U/L (ref 39–117)
BUN: 6 mg/dL (ref 6–23)
BUN: 9 mg/dL (ref 6–23)
CO2: 31 mEq/L (ref 19–32)
Calcium: 8.6 mg/dL (ref 8.4–10.5)
Calcium: 9.5 mg/dL (ref 8.4–10.5)
Chloride: 100 mEq/L (ref 96–112)
Chloride: 101 mEq/L (ref 96–112)
Chloride: 101 mEq/L (ref 96–112)
GFR calc Af Amer: >60 mL/min (ref >60)
GFR calc Af Amer: >60 mL/min (ref >60)
GFR calc non Af Amer: >60 mL/min (ref >60)
Glucose, Bld: 162 mg/dL — ABNORMAL HIGH (ref 70–99)
Potassium: 3.3 mEq/L — ABNORMAL LOW (ref 3.5–5.1)
Potassium: 3.9 mEq/L (ref 3.5–5.1)
Sodium: 140 mEq/L (ref 135–145)
Sodium: 138 mEq/L (ref 135–145)
Total Bilirubin: 0.3 mg/dL (ref 0.3–1.2)
Total Protein: 5 g/dL — ABNORMAL LOW (ref 6.0–8.3)
Total Protein: 5.3 g/dL — ABNORMAL LOW (ref 6.0–8.3)
Total Protein: 7.2 g/dL (ref 6.0–8.3)

## 2010-04-25 LAB — GLUCOSE, CAPILLARY
Glucose-Capillary: 127 mg/dL — ABNORMAL HIGH (ref 70–99)
Glucose-Capillary: 171 mg/dL — ABNORMAL HIGH (ref 70–99)
Glucose-Capillary: 231 mg/dL — ABNORMAL HIGH (ref 70–99)
Glucose-Capillary: 292 mg/dL — ABNORMAL HIGH (ref 70–99)
Glucose-Capillary: 104 mg/dL — ABNORMAL HIGH (ref 70–99)
Glucose-Capillary: 106 mg/dL — ABNORMAL HIGH (ref 70–99)
Glucose-Capillary: 112 mg/dL — ABNORMAL HIGH (ref 70–99)
Glucose-Capillary: 116 mg/dL — ABNORMAL HIGH (ref 70–99)
Glucose-Capillary: 118 mg/dL — ABNORMAL HIGH (ref 70–99)
Glucose-Capillary: 118 mg/dL — ABNORMAL HIGH (ref 70–99)
Glucose-Capillary: 122 mg/dL — ABNORMAL HIGH (ref 70–99)
Glucose-Capillary: 122 mg/dL — ABNORMAL HIGH (ref 70–99)
Glucose-Capillary: 123 mg/dL — ABNORMAL HIGH (ref 70–99)
Glucose-Capillary: 126 mg/dL — ABNORMAL HIGH (ref 70–99)
Glucose-Capillary: 126 mg/dL — ABNORMAL HIGH (ref 70–99)
Glucose-Capillary: 127 mg/dL — ABNORMAL HIGH (ref 70–99)
Glucose-Capillary: 131 mg/dL — ABNORMAL HIGH (ref 70–99)
Glucose-Capillary: 135 mg/dL — ABNORMAL HIGH (ref 70–99)
Glucose-Capillary: 138 mg/dL — ABNORMAL HIGH (ref 70–99)
Glucose-Capillary: 139 mg/dL — ABNORMAL HIGH (ref 70–99)
Glucose-Capillary: 142 mg/dL — ABNORMAL HIGH (ref 70–99)
Glucose-Capillary: 142 mg/dL — ABNORMAL HIGH (ref 70–99)
Glucose-Capillary: 148 mg/dL — ABNORMAL HIGH (ref 70–99)
Glucose-Capillary: 148 mg/dL — ABNORMAL HIGH (ref 70–99)
Glucose-Capillary: 152 mg/dL — ABNORMAL HIGH (ref 70–99)
Glucose-Capillary: 153 mg/dL — ABNORMAL HIGH (ref 70–99)
Glucose-Capillary: 157 mg/dL — ABNORMAL HIGH (ref 70–99)
Glucose-Capillary: 157 mg/dL — ABNORMAL HIGH (ref 70–99)
Glucose-Capillary: 161 mg/dL — ABNORMAL HIGH (ref 70–99)
Glucose-Capillary: 163 mg/dL — ABNORMAL HIGH (ref 70–99)
Glucose-Capillary: 166 mg/dL — ABNORMAL HIGH (ref 70–99)
Glucose-Capillary: 168 mg/dL — ABNORMAL HIGH (ref 70–99)
Glucose-Capillary: 171 mg/dL — ABNORMAL HIGH (ref 70–99)
Glucose-Capillary: 175 mg/dL — ABNORMAL HIGH (ref 70–99)
Glucose-Capillary: 178 mg/dL — ABNORMAL HIGH (ref 70–99)
Glucose-Capillary: 188 mg/dL — ABNORMAL HIGH (ref 70–99)
Glucose-Capillary: 206 mg/dL — ABNORMAL HIGH (ref 70–99)
Glucose-Capillary: 208 mg/dL — ABNORMAL HIGH (ref 70–99)
Glucose-Capillary: 218 mg/dL — ABNORMAL HIGH (ref 70–99)
Glucose-Capillary: 226 mg/dL — ABNORMAL HIGH (ref 70–99)
Glucose-Capillary: 93 mg/dL (ref 70–99)
Glucose-Capillary: 94 mg/dL (ref 70–99)

## 2010-04-25 LAB — ABO/RH: ABO/RH(D): AB POS

## 2010-04-25 LAB — BASIC METABOLIC PANEL WITH GFR
BUN: 12 mg/dL (ref 6–23)
BUN: 15 mg/dL (ref 6–23)
BUN: 9 mg/dL (ref 6–23)
CO2: 22 meq/L (ref 19–32)
CO2: 22 meq/L (ref 19–32)
CO2: 25 meq/L (ref 19–32)
Calcium: 7.9 mg/dL — ABNORMAL LOW (ref 8.4–10.5)
Calcium: 8 mg/dL — ABNORMAL LOW (ref 8.4–10.5)
Calcium: 8.1 mg/dL — ABNORMAL LOW (ref 8.4–10.5)
Chloride: 100 meq/L (ref 96–112)
Chloride: 100 meq/L (ref 96–112)
Chloride: 104 meq/L (ref 96–112)
Creatinine, Ser: 0.77 mg/dL (ref 0.4–1.2)
Creatinine, Ser: 0.94 mg/dL (ref 0.4–1.2)
Creatinine, Ser: 1.3 mg/dL — ABNORMAL HIGH (ref 0.4–1.2)
GFR calc non Af Amer: 42 mL/min — ABNORMAL LOW
GFR calc non Af Amer: >60 mL/min
GFR calc non Af Amer: >60 mL/min
Glucose, Bld: 123 mg/dL — ABNORMAL HIGH (ref 70–99)
Glucose, Bld: 164 mg/dL — ABNORMAL HIGH (ref 70–99)
Glucose, Bld: 177 mg/dL — ABNORMAL HIGH (ref 70–99)
Potassium: 3.6 meq/L (ref 3.5–5.1)
Potassium: 3.9 meq/L (ref 3.5–5.1)
Potassium: 4 meq/L (ref 3.5–5.1)
Sodium: 134 meq/L — ABNORMAL LOW (ref 135–145)
Sodium: 135 meq/L (ref 135–145)
Sodium: 137 meq/L (ref 135–145)

## 2010-04-25 LAB — BASIC METABOLIC PANEL
BUN: 12 mg/dL (ref 6–23)
BUN: 12 mg/dL (ref 6–23)
BUN: 18 mg/dL (ref 6–23)
CO2: 26 mEq/L (ref 19–32)
CO2: 27 mEq/L (ref 19–32)
CO2: 29 mEq/L (ref 19–32)
Calcium: 8.1 mg/dL — ABNORMAL LOW (ref 8.4–10.5)
Calcium: 8.1 mg/dL — ABNORMAL LOW (ref 8.4–10.5)
Chloride: 104 mEq/L (ref 96–112)
Chloride: 104 mEq/L (ref 96–112)
Chloride: 98 mEq/L (ref 96–112)
Creatinine, Ser: 0.83 mg/dL (ref 0.4–1.2)
Creatinine, Ser: 0.9 mg/dL (ref 0.4–1.2)
Creatinine, Ser: 1.13 mg/dL (ref 0.4–1.2)
GFR calc Af Amer: 45 mL/min — ABNORMAL LOW (ref >60)
GFR calc Af Amer: 59 mL/min — ABNORMAL LOW (ref >60)
GFR calc Af Amer: >60 mL/min (ref >60)
GFR calc Af Amer: >60 mL/min (ref >60)
Glucose, Bld: 183 mg/dL — ABNORMAL HIGH (ref 70–99)
Potassium: 3.3 mEq/L — ABNORMAL LOW (ref 3.5–5.1)
Potassium: 3.9 mEq/L (ref 3.5–5.1)
Potassium: 3.9 mEq/L (ref 3.5–5.1)
Sodium: 139 mEq/L (ref 135–145)
Sodium: 142 mEq/L (ref 135–145)

## 2010-04-25 LAB — DIFFERENTIAL
Basophils Absolute: 0 10*3/uL (ref 0.0–0.1)
Basophils Relative: 0 % (ref 0–1)
Basophils Relative: 0 % (ref 0–1)
Eosinophils Absolute: 0.1 10*3/uL (ref 0.0–0.7)
Eosinophils Absolute: 0.1 10*3/uL (ref 0.0–0.7)
Eosinophils Absolute: 0.2 10*3/uL (ref 0.0–0.7)
Eosinophils Relative: 2 % (ref 0–5)
Eosinophils Relative: 1 % (ref 0–5)
Eosinophils Relative: 2 % (ref 0–5)
Lymphocytes Relative: 14 % (ref 12–46)
Lymphocytes Relative: 21 % (ref 12–46)
Lymphs Abs: 1.3 10*3/uL (ref 0.7–4.0)
Lymphs Abs: 1.7 10*3/uL (ref 0.7–4.0)
Monocytes Absolute: 0.8 10*3/uL (ref 0.1–1.0)
Monocytes Absolute: 0.6 10*3/uL (ref 0.1–1.0)
Monocytes Absolute: 0.7 10*3/uL (ref 0.1–1.0)
Monocytes Relative: 9 % (ref 3–12)
Monocytes Relative: 7 % (ref 3–12)
Monocytes Relative: 7 % (ref 3–12)
Neutro Abs: 7.1 10*3/uL (ref 1.7–7.7)
Neutrophils Relative %: 77 % (ref 43–77)

## 2010-04-25 LAB — HEMOGLOBIN AND HEMATOCRIT, BLOOD
HCT: 31.5 % — ABNORMAL LOW (ref 36.0–46.0)
Hemoglobin: 11 g/dL — ABNORMAL LOW (ref 12.0–15.0)
Hemoglobin: 11.1 g/dL — ABNORMAL LOW (ref 12.0–15.0)

## 2010-04-25 LAB — SURGICAL PCR SCREEN: Staphylococcus aureus: NEGATIVE

## 2010-04-25 LAB — MAGNESIUM: Magnesium: 1.7 mg/dL (ref 1.5–2.5)

## 2010-04-25 LAB — PREALBUMIN
Prealbumin: 14.2 mg/dL — ABNORMAL LOW (ref 18.0–45.0)
Prealbumin: 15.5 mg/dL — ABNORMAL LOW (ref 18.0–45.0)
Prealbumin: 16.2 mg/dL — ABNORMAL LOW (ref 18.0–45.0)

## 2010-04-25 LAB — CREATININE, FLUID (PLEURAL, PERITONEAL, JP DRAINAGE): Creat, Fluid: 1.2 mg/dL

## 2010-04-25 LAB — TYPE AND SCREEN

## 2010-05-16 ENCOUNTER — Ambulatory Visit: Payer: BC Managed Care – PPO | Attending: Neurology | Admitting: Physical Therapy

## 2010-05-16 DIAGNOSIS — M6281 Muscle weakness (generalized): Secondary | ICD-10-CM | POA: Insufficient documentation

## 2010-05-16 DIAGNOSIS — R269 Unspecified abnormalities of gait and mobility: Secondary | ICD-10-CM | POA: Insufficient documentation

## 2010-05-16 DIAGNOSIS — IMO0001 Reserved for inherently not codable concepts without codable children: Secondary | ICD-10-CM | POA: Insufficient documentation

## 2010-05-19 ENCOUNTER — Emergency Department (HOSPITAL_COMMUNITY): Payer: BC Managed Care – PPO

## 2010-05-19 ENCOUNTER — Inpatient Hospital Stay (HOSPITAL_COMMUNITY)
Admission: EM | Admit: 2010-05-19 | Discharge: 2010-05-24 | DRG: 569 | Disposition: A | Discharge status: Skilled Nursing Facility | Payer: BC Managed Care – PPO | Attending: Internal Medicine | Admitting: Internal Medicine

## 2010-05-19 DIAGNOSIS — A498 Other bacterial infections of unspecified site: Secondary | ICD-10-CM | POA: Diagnosis present

## 2010-05-19 DIAGNOSIS — S93429A Sprain of deltoid ligament of unspecified ankle, initial encounter: Secondary | ICD-10-CM | POA: Diagnosis present

## 2010-05-19 DIAGNOSIS — S8360XA Sprain of the superior tibiofibular joint and ligament, unspecified knee, initial encounter: Secondary | ICD-10-CM | POA: Diagnosis present

## 2010-05-19 DIAGNOSIS — Y92009 Unspecified place in unspecified non-institutional (private) residence as the place of occurrence of the external cause: Secondary | ICD-10-CM

## 2010-05-19 DIAGNOSIS — I10 Essential (primary) hypertension: Secondary | ICD-10-CM | POA: Diagnosis present

## 2010-05-19 DIAGNOSIS — Z85528 Personal history of other malignant neoplasm of kidney: Secondary | ICD-10-CM

## 2010-05-19 DIAGNOSIS — K5900 Constipation, unspecified: Secondary | ICD-10-CM | POA: Diagnosis not present

## 2010-05-19 DIAGNOSIS — N390 Urinary tract infection, site not specified: Principal | ICD-10-CM | POA: Diagnosis present

## 2010-05-19 DIAGNOSIS — G610 Guillain-Barre syndrome: Secondary | ICD-10-CM | POA: Diagnosis present

## 2010-05-19 DIAGNOSIS — Z79899 Other long term (current) drug therapy: Secondary | ICD-10-CM

## 2010-05-19 DIAGNOSIS — E119 Type 2 diabetes mellitus without complications: Secondary | ICD-10-CM | POA: Diagnosis present

## 2010-05-19 DIAGNOSIS — X500XXA Overexertion from strenuous movement or load, initial encounter: Secondary | ICD-10-CM | POA: Diagnosis present

## 2010-05-19 DIAGNOSIS — W19XXXA Unspecified fall, initial encounter: Secondary | ICD-10-CM | POA: Diagnosis present

## 2010-05-19 DIAGNOSIS — M1990 Unspecified osteoarthritis, unspecified site: Secondary | ICD-10-CM | POA: Diagnosis present

## 2010-05-19 DIAGNOSIS — Z87891 Personal history of nicotine dependence: Secondary | ICD-10-CM

## 2010-05-19 LAB — DIFFERENTIAL
Basophils Absolute: 0 K/uL (ref 0.0–0.1)
Basophils Relative: 0 % (ref 0–1)
Eosinophils Absolute: 0.2 10*3/uL (ref 0.0–0.7)
Eosinophils Relative: 2 % (ref 0–5)
Lymphocytes Relative: 26 % (ref 12–46)
Lymphs Abs: 2.7 10*3/uL (ref 0.7–4.0)
Monocytes Absolute: 0.7 K/uL (ref 0.1–1.0)
Monocytes Relative: 7 % (ref 3–12)
Neutro Abs: 6.6 K/uL (ref 1.7–7.7)
Neutrophils Relative %: 65 % (ref 43–77)

## 2010-05-19 LAB — TROPONIN I: Troponin I: 0.01 ng/mL (ref 0.00–0.06)

## 2010-05-19 LAB — BASIC METABOLIC PANEL WITH GFR
CO2: 29 meq/L (ref 19–32)
Calcium: 9.7 mg/dL (ref 8.4–10.5)
Chloride: 103 meq/L (ref 96–112)
Glucose, Bld: 135 mg/dL — ABNORMAL HIGH (ref 70–99)
Sodium: 139 meq/L (ref 135–145)

## 2010-05-19 LAB — BASIC METABOLIC PANEL
BUN: 13 mg/dL (ref 6–23)
Creatinine, Ser: 0.79 mg/dL (ref 0.4–1.2)
GFR calc Af Amer: >60 mL/min (ref >60)
GFR calc non Af Amer: >60 mL/min (ref >60)
Potassium: 4 mEq/L (ref 3.5–5.1)

## 2010-05-19 LAB — CBC
HCT: 36.5 % (ref 36.0–46.0)
Hemoglobin: 12.7 g/dL (ref 12.0–15.0)
MCH: 29.7 pg (ref 26.0–34.0)
MCHC: 34.8 g/dL (ref 30.0–36.0)
MCV: 85.5 fL (ref 78.0–100.0)
Platelets: 288 10*3/uL (ref 150–400)
RBC: 4.27 MIL/uL (ref 3.87–5.11)
RDW: 13.5 % (ref 11.5–15.5)
WBC: 10.2 10*3/uL (ref 4.0–10.5)

## 2010-05-19 LAB — BRAIN NATRIURETIC PEPTIDE: Pro B Natriuretic peptide (BNP): <30 pg/mL (ref 0.0–100.0)

## 2010-05-19 LAB — CK TOTAL AND CKMB (NOT AT ARMC)
CK, MB: 1 ng/mL (ref 0.3–4.0)
Relative Index: INVALID (ref 0.0–2.5)
Total CK: 45 U/L (ref 7–177)

## 2010-05-19 LAB — D-DIMER, QUANTITATIVE: D-Dimer, Quant: 0.57 ug/mL-FEU — ABNORMAL HIGH (ref 0.00–0.48)

## 2010-05-19 LAB — LIPID PANEL: Cholesterol: 187 mg/dL (ref 0–200)

## 2010-05-20 ENCOUNTER — Inpatient Hospital Stay (HOSPITAL_COMMUNITY): Payer: BC Managed Care – PPO

## 2010-05-20 LAB — CARDIAC PANEL(CRET KIN+CKTOT+MB+TROPI)
CK, MB: 0.9 ng/mL (ref 0.3–4.0)
CK, MB: 0.9 ng/mL (ref 0.3–4.0)
Relative Index: INVALID (ref 0.0–2.5)
Troponin I: 0.01 ng/mL (ref 0.00–0.06)
Troponin I: 0.02 ng/mL (ref 0.00–0.06)

## 2010-05-20 LAB — MAGNESIUM: Magnesium: 1.7 mg/dL (ref 1.5–2.5)

## 2010-05-20 LAB — COMPREHENSIVE METABOLIC PANEL
ALT: 16 U/L (ref 0–35)
AST: 20 U/L (ref 0–37)
Albumin: 3.6 g/dL (ref 3.5–5.2)
Calcium: 9.1 mg/dL (ref 8.4–10.5)
GFR calc Af Amer: >60 mL/min (ref >60)
Glucose, Bld: 221 mg/dL — ABNORMAL HIGH (ref 70–99)
Sodium: 138 mEq/L (ref 135–145)
Total Protein: 6.2 g/dL (ref 6.0–8.3)

## 2010-05-20 LAB — CBC
MCHC: 34.7 g/dL (ref 30.0–36.0)
Platelets: 266 10*3/uL (ref 150–400)
RDW: 13.4 % (ref 11.5–15.5)
WBC: 10.8 10*3/uL — ABNORMAL HIGH (ref 4.0–10.5)

## 2010-05-20 LAB — URINALYSIS, ROUTINE W REFLEX MICROSCOPIC
Bilirubin Urine: NEGATIVE
Glucose, UA: NEGATIVE mg/dL
Hgb urine dipstick: NEGATIVE
Specific Gravity, Urine: 1.019 (ref 1.005–1.030)

## 2010-05-20 LAB — GLUCOSE, CAPILLARY
Glucose-Capillary: 203 mg/dL — ABNORMAL HIGH (ref 70–99)
Glucose-Capillary: 177 mg/dL — ABNORMAL HIGH (ref 70–99)

## 2010-05-20 LAB — TSH: TSH: 1.663 u[IU]/mL (ref 0.350–4.500)

## 2010-05-20 LAB — URINE MICROSCOPIC-ADD ON

## 2010-05-20 LAB — HEMOGLOBIN A1C: Hgb A1c MFr Bld: 5.6 % (ref <5.7)

## 2010-05-21 LAB — CBC
HCT: 44.3 % (ref 36.0–46.0)
Hemoglobin: 15.2 g/dL — ABNORMAL HIGH (ref 12.0–15.0)
MCHC: 33.7 g/dL (ref 30.0–36.0)
MCHC: 34.4 g/dL (ref 30.0–36.0)
Platelets: 252 10*3/uL (ref 150–400)
RBC: 4.84 MIL/uL (ref 3.87–5.11)
RDW: 13.5 % (ref 11.5–15.5)
WBC: 8.7 10*3/uL (ref 4.0–10.5)

## 2010-05-21 LAB — URINALYSIS, ROUTINE W REFLEX MICROSCOPIC
Bilirubin Urine: NEGATIVE
Glucose, UA: >1000 mg/dL — AB
Protein, ur: NEGATIVE mg/dL

## 2010-05-21 LAB — GLUCOSE, CAPILLARY
Glucose-Capillary: 134 mg/dL — ABNORMAL HIGH (ref 70–99)
Glucose-Capillary: 163 mg/dL — ABNORMAL HIGH (ref 70–99)

## 2010-05-21 LAB — URINE MICROSCOPIC-ADD ON

## 2010-05-21 LAB — BASIC METABOLIC PANEL
Calcium: 8.8 mg/dL (ref 8.4–10.5)
GFR calc Af Amer: >60 mL/min (ref >60)
GFR calc non Af Amer: >60 mL/min (ref >60)
Glucose, Bld: 150 mg/dL — ABNORMAL HIGH (ref 70–99)
Potassium: 3.8 mEq/L (ref 3.5–5.1)
Sodium: 138 mEq/L (ref 135–145)

## 2010-05-21 LAB — MAGNESIUM: Magnesium: 1.8 mg/dL (ref 1.5–2.5)

## 2010-05-21 LAB — URINE CULTURE: Colony Count: 9000

## 2010-05-22 LAB — URINE CULTURE
Colony Count: >=100000
Culture  Setup Time: 201204072109

## 2010-05-22 LAB — GLUCOSE, CAPILLARY
Glucose-Capillary: 129 mg/dL — ABNORMAL HIGH (ref 70–99)
Glucose-Capillary: 230 mg/dL — ABNORMAL HIGH (ref 70–99)

## 2010-05-23 LAB — GLUCOSE, CAPILLARY
Glucose-Capillary: 138 mg/dL — ABNORMAL HIGH (ref 70–99)
Glucose-Capillary: 193 mg/dL — ABNORMAL HIGH (ref 70–99)

## 2010-05-23 LAB — IMMUNOFIXATION ELECTROPHORESIS: IgM, Serum: 149 mg/dL (ref 60–263)

## 2010-05-23 NOTE — Discharge Summary (Signed)
NAME:  Nicole Strong, KECKLER NO.:  [id number]  MEDICAL RECORD NO.:  [id number]           PATIENT TYPE:  I  LOCATION:  [id number]                         FACILITY:  [place]  PHYSICIAN:  Lottie Dawson, MD       DATE OF BIRTH:  07/22/1950  DATE OF ADMISSION:  05/19/2010 DATE OF DISCHARGE:                        DISCHARGE SUMMARY - REFERRING   PRIMARY CARE PHYSICIAN:  Rosalita Chessman, DO.  UROLOGIST:  Raynelle Bring, MD.  NEUROLOGIST:  [facility].  DISCHARGE DIAGNOSES: 1. Mechanical Fall. 2. Generalized weakness. 3. E-coli urinary tract infection. 4. Left ankle pain with partial tear of deltoid ligament and anterior     talofibular ligament with large ankle effusion from the fall. 5. Hypertension. 6. Type 2 diabetes. 7. History of Guillain-Barre syndrome. 8. History of renal cell carcinoma, status post nephrectomy and     urethral removal with bladder chemotherapy. 9. Right hip sprain from the fall. 10.History of cigarette smoking, has quit smoking. 11.Tenosynovitis of the flexor digitorum longus on the left. 12.Osteoarthritis.  DISCHARGE MEDICATIONS: 1. Acetaminophen 650 mg every 6 hours as needed. 2. Cefuroxime 500 mg p.o. twice daily until May 26, 2010. 3. Gabapentin 300 mg p.o. q.8 h. 4. Naproxen 500 mg p.o. 3 times a day as needed. 5. Polyethylene glycol 17 grams p.o. daily. 6. Ambien 5 mg daily at bedtime as needed for sleep. 7. Cyclobenzaprine 10 mg p.o. 3 times a day as needed. 8. Lisinopril 10 mg p.o. q.a.m. 9. Metformin 500 mg p.o. twice daily. 10.Oxycodone/acetaminophen 5/325 1-2 tablets every 6 hours as needed     for pain. 11.Phenergan 25 mg p.o. q.6 h. as needed for nausea. 12.Tramadol 50 mg 1-2 tablets every 4 hours as needed for pain.  BRIEF ADMITTING HISTORY AND PHYSICAL:  [name] is a 60 year old Caucasian female with history of Guillain-Barre diagnoses in December 2012, underwent plasmapheresis.  She has been at a rehab facility  and was just recently discharged home 1 week prior to admission, who presented with a fall and left ankle pain.  RADIOLOGY/IMAGING: 1. The patient had an x-ray of the left ankle, which shows no acute     bony findings. 2. The patient had a portable chest x-ray, which showed mild     hyperexpanded lungs but clear.  Calcification overlying the left     rotator cuff likely reflects calcific tendonitis. 3. The patient had a MRI of the left ankle, which showed large ankle     effusion with partial tear of the deltoid ligament and the anterior     talofibular ligament.  No fracture or acute bony osseous injury.     The patient also has posttraumatic tenosynovitis of the flexor     digitorum longus.  Tarsometatarsal and first MTP joint     osteoarthritis.  LABORATORY DATA:  CBC shows white count of 8.7, hemoglobin 11.0, hematocrit 32.6, platelet count 252.  Electrolytes normal with a creatinine of 0.80.  Troponins negative x3.  UA was positive for nitrates and small leukocytes.  Urine culture was sensitive to ceftriaxone, but resistant to ciprofloxacin.  LDL is 101.  TSH is 1.663.  D-dimer was 0.57.  HOSPITAL COURSE BY PROBLEM: 1. Fall, most likely secondary to generalized weakness and urinary     tract infection.  The patient was evaluated by physical therapy,     and possibly the patient would benefit from skilled nursing     facility.  D-dimer elevation is likely due to urinary tract     infection and fall.  The patient declined V/Q scan.  Given the     patient is not complaining of any shortness of breath or any     changes on telemetry, I believe that the patient has low likelihood     of PE. 2. E-coli urinary tract infection.  Initially, the patient was started     on ciprofloxacin, however, urine culture sensitivities showed the E-     coli was resistant to ciprofloxacin as a result antibiotics were     transitioned to cefuroxime, which she will continue for 4 more days     to  complete a 5-day course of antibiotics. 3. Left ankle pain.  An MRI showed partial tear of deltoid ligament     and anterior talofibular ligament with large ankle effusion, and     the patient also has tenosynovitis.  I spoke with ortho physician     on call, who recommended ortho boot, which has been ordered for     her, and the patient would benefit from pain management and NSAIDs.     If her pain does not improve in the next week, consider referral to     her orthopedic physician to perhaps drain the large ankle effusion. 4. Hypertension, stable.  Continue the patient all home medications. 5. Type 2 diabetes.  The patient was continued on home medication.     Initially, metformin was held, which will be resumed at the time of     discharge. 6. History of Guillain-Barre syndrome.  The patient is evaluated by     Neurology, and they did not think that her fall was Guillain-Barre     related.  Continue outpatient management as per Neurology.  The     patient's gabapentin dose was increased to 300 mg p.o. q.8 h. per     Neurology. 7. History of renal cell carcinoma, status post nephrectomy on the     left and urethral removal with bladder chemotherapy.  The patient     to follow with Dr. Alinda Money, her urologist and management as per     outpatient.  Given the patient has solitary kidney, we would     limit NSAIDs as much as possible. 8. Constipation.  The patient is on MiraLax.  Time spent on discharge, talking to the patient and coordinating care, was 25 minutes.   Lottie Dawson, MD   SR/MEDQ  D:  05/23/2010  T:  05/23/2010  Job:  [id number]  cc:   Rosalita Chessman, DO Raynelle Bring, MD [facility]  Electronically Signed by Alveta Heimlich Kriste Broman  on 05/23/2010 09:17:26 PM

## 2010-05-24 LAB — GLUCOSE, CAPILLARY
Glucose-Capillary: 187 mg/dL — ABNORMAL HIGH (ref 70–99)
Glucose-Capillary: 159 mg/dL — ABNORMAL HIGH (ref 70–99)
Glucose-Capillary: 158 mg/dL — ABNORMAL HIGH (ref 70–99)
Glucose-Capillary: 174 mg/dL — ABNORMAL HIGH (ref 70–99)

## 2010-05-30 ENCOUNTER — Ambulatory Visit: Payer: BC Managed Care – PPO | Admitting: Physical Therapy

## 2010-06-01 ENCOUNTER — Ambulatory Visit: Payer: BC Managed Care – PPO | Admitting: Physical Therapy

## 2010-06-06 ENCOUNTER — Ambulatory Visit: Payer: BC Managed Care – PPO | Admitting: Physical Therapy

## 2010-06-08 ENCOUNTER — Ambulatory Visit: Payer: BC Managed Care – PPO | Admitting: Physical Therapy

## 2010-06-13 ENCOUNTER — Ambulatory Visit: Payer: BC Managed Care – PPO | Admitting: Physical Therapy

## 2010-06-15 ENCOUNTER — Ambulatory Visit: Payer: BC Managed Care – PPO | Admitting: Physical Therapy

## 2010-06-20 ENCOUNTER — Ambulatory Visit: Payer: BC Managed Care – PPO | Admitting: Physical Therapy

## 2010-06-22 ENCOUNTER — Ambulatory Visit: Payer: BC Managed Care – PPO | Admitting: Physical Therapy

## 2010-06-25 NOTE — H&P (Signed)
NAME:  Nicole Strong, Nicole Strong                ACCOUNT NO.:  [id number]  MEDICAL RECORD NO.:  [id number]           PATIENT TYPE:  E  LOCATION:  [facility]                         FACILITY:  [facility]  PHYSICIAN:  Rise Patience, MDDATE OF BIRTH:  [birth year]  DATE OF ADMISSION:  05/19/2010 DATE OF DISCHARGE:                             HISTORY & PHYSICAL   PRIMARY CARE PHYSICIAN:  Rosalita Chessman, DO  CHIEF COMPLAINT:  Weakness and fall.  HISTORY OF PRESENT ILLNESS:  A 60 year old female who was diagnosed with Ezequiel Ganser 3 months ago in December 2011, had plasmapheresis at that time and was discharged to rehab.  She was discharged from rehab last week and was doing fine and she was trying to walk today and she fell, she states her left leg gave way and fell and since then she has had left ankle swelling and she came to the ER.  In the ER, the patient's x- rays are negative for any fracture.  The patient has been admitted for further workup as the patient is unable to walk because of intense pain in the left ankle.  The patient denies any new weakness.  The patient does have weakness in the lower extremities.  She also has tingling and numbness and also has some mild weakness in the upper extremities which the patient states is not new from what she had before.  The patient denies any chest pain, shortness of breath, nausea, vomiting, abdominal pain, dysuria, discharge, diarrhea.  Denies any loss of consciousness, headache, visual symptoms.  PAST MEDICAL HISTORY: 1. Recently diagnosed with Ezequiel Ganser syndrome was in rehab for 3     months, had undergone plasmapheresis. 2. History of cancer of the kidneys wherein she had undergone     nephrectomy and also ureter removal with bladder chemotherapy. 3. Diabetes mellitus type 2. 4. Hypertension. 5. Previous history of cigarette smoking, quit for 5 years now.  PAST SURGICAL HISTORY:  Cesarean section, cholecystectomy and nephrectomy for  cancer of the kidney.  MEDICATIONS DURING ADMISSION:  Lisinopril 10 mg p.o. daily, metformin and Neurontin extended-release.  ALLERGIES:  CODEINE AND VICODIN.  FAMILY HISTORY:  Father had prostate cancer.  Sister was diagnosed physically with breast cancer.  Brother had multiple sclerosis.  REVIEW OF SYSTEMS:  As per the history of present illness nothing else significant.  PHYSICAL EXAMINATION:  HEENT:  The patient examined at bedside not in acute distress. VITAL SIGNS:  Blood pressure is 146/88, pulse 87, temperature 97.7, respirations 20 per minute, O2 95%. HEENT: Anicteric.  No pallor.  No facial asymmetry.  Tongue is midline. No neck rigidity. CHEST:  Bilateral air entry present.  No rhonchi, no crepitation. HEART:  S1, S2 heard. ABDOMEN:  Soft, nontender.  Bowel sounds heard. CNS:  The patient is alert, awake, and oriented to time, place and person, is able to move both upper extremities 3/5.  Lower extremities the patient has only strength in left lower extremity 1/5 which the patient says is not new and also has strength in right lower extremity 2/5, reflexes are absent which also the patient says new. EXTREMITIES: There is  intense tenderness in the left ankle even with minimal movement, mild swelling, no erythema.  Pulses felt.  No acute ischemic changes as of following.  LABORATORY DATA:  EKG shows normal sinus rhythm with some T-wave changes, I have old EKG to compare.  X-ray of the left ankle shows no acute bony findings.  CBC WBC is 10.2, hemoglobin is 12.7, hematocrit is 36.5, platelets 288.  Basic metabolic panel sodium [id number], potassium 4, chloride 103, carbon dioxide 29, glucose 135, BUN 13, creatinine 0.7, calcium 9.7.  ASSESSMENT: 1. Near syncope status post fall with recent history of Gillian Barre     syndrome. 2. Left ankle pain. 3. Hypertension. 4. Diabetes mellitus type 2. 5. Previous history of cigarette smoking. 6. History of cancer of the kidneys  status post nephrectomy.  PLAN: 1. At this time, admit the patient to telemetry to observe any     arrhythmias. 2. For her generalized weakness, fall and near syncope, I have     consulted neurologist, Dr. Jannifer Franklin to see if any exacerbation of     Ezequiel Ganser will follow the recommendation.  At this time, the     patient will be gently hydrated. We will get PT/OT consult.  At     this time, her Bevelyn Buckles' sign is limited because of the intense left     ankle pain. 3. For her left ankle pain, we did MRI to make sure there is no     ligament tear or fractures.  If her pain persists, then we need to     get __________ negative. 4. EKG shows some T-wave changes.  The patient has no chest pain at     this time and we will cycle cardiac markers, get a 2-D echo.  We     will also get D-dimer and BNP levels. 5. For diabetes at this time, we will place the patient on CBG checks     with sliding scale coverage. 6. Further recommendation as condition evolves.     Rise Patience, MD     ANK/MEDQ  D:  05/19/2010  T:  05/19/2010  Job:  [id number]  cc:   [name], M.D. Rosalita Chessman, DO  Electronically Signed by Gean Birchwood MD on 06/25/2010 08:01:16 AM

## 2010-06-27 NOTE — Consult Note (Signed)
NAME:  Nicole, Strong                ACCOUNT NO.:  [id number]  MEDICAL RECORD NO.:  [id number]           PATIENT TYPE:  I  LOCATION:  [id number]                         FACILITY:  [facility]  PHYSICIAN:  Jill Alexanders, M.D.  DATE OF BIRTH:  Feb 06, 1951  DATE OF CONSULTATION:  05/19/2010 DATE OF DISCHARGE:                                CONSULTATION   HISTORY OF PRESENT ILLNESS:  Nicole Strong is a 60 year old right-handed white female born on May 17, 1950, with a history Guilain-Barre syndrome.  This patient had a nephrectomy for renal cell carcinoma that occurred around January 03, 2010.  During that hospitalization, the patient reported some numbness beginning in her feet.  This persisted following discharge and the patient returned on January 29, 2010.  The patient was felt to have Guilain-Barre syndrome with significant sensory alteration and weakness in the legs.  The patient required plasmapheresis treatments and received a total of 5 treatments.  Lumbar puncture did show an elevation in protein without inflammatory cells. Hepatitis B and C evaluations were negative.  B12 was unremarkable. Thyroid was unremarkable.  The patient seemed to stabilize with plasmapheresis therapy and was discharged to [facility].  The patient has been almost 3 months in Rehab and just recently returned to home.  The patient made very slow gains with rehab and has gotten back to the ability to walk very slowly with a walker.  The patient claims she cannot feel her feet and has significant weakness of the legs, left greater than right.  The patient fell today at home while alone.  The patient twisted her left ankle, but did not fracture with significant pain in the ankle.  The patient comes into the hospital for an evaluation.  The patient does not believe that there have been any changes in her strength or sensation recently, but she has not got back to normal.  PAST MEDICAL HISTORY:  Significant  for: 1. Guilain-Barre syndrome, status post treatment. 2. Diabetes. 3. Renal cell carcinoma, status post nephrectomy on the left. 4. Gastroesophageal reflux disease. 5. Anxiety and depression. 6. Hypertension. 7. Degenerative arthritis. 8. C-section. 9. Fracture of left ankle in the past.  ALLERGIES:  The patient states an allergy to CODEINE which causes nausea and confusion and BIAXIN causes her face to become red.  MEDICATIONS:  At this time include: 1. Lisinopril 10 mg daily. 2. Metformin 500 mg twice daily. 3. Horizant 600 mg 1 daily. 4. Ambien 10 mg at night if needed. 5. Flexeril 10 mg 3 times daily if needed.  SOCIAL HISTORY:  The patient does not currently smoke or drink.  This patient is divorced, lives in the [place] area, lives alone.  The patient has 1 son who goes to the [employer]  The patient is currently on disability, was working prior to her nephrectomy.  The patient will be applying for long-term disability.  FAMILY MEDICAL HISTORY:  Notable that mother is still living, otherwise in fairly good health.  Father died with prostate cancer.  The patient has 1 brother and 1 sister.  Brother has multiple sclerosis.  The sister has had breast  cancer.  REVIEW OF SYSTEMS:  Notable for some episodes of significant chills. The patient notes that her legs may turn bright red at times with a burning sensation in the lower part of her body and legs.  The patient does note some abdominal discomfort.  She has had CT workup that has been unremarkable.  The patient has some occasional problems controlling the bladder.  The patient does note some blurred vision.  Denies shortness of breath or difficulty swallowing.  Denies headache or neck pain.  The patient has some trouble sleeping secondary to neuropathic pain in the legs.  PHYSICAL EXAMINATION:  VITAL SIGNS:  Blood pressure is 146/88, heart rate 82, respiratory rate 20, and temperature afebrile. GENERAL:  The patient is  a minimally obese white female who is alert and cooperative at the time of examination. HEENT:  Head is atraumatic.  Eyes, pupils are equal, round, and react to light. NECK:  Supple.  No carotid bruits noted. RESPIRATORY:  Clear. CARDIOVASCULAR:  Regular rate and rhythm.  No obvious murmurs or rubs noted. EXTREMITIES:  Notable for trace edema bilaterally. NEUROLOGIC:  Cranial nerves as above.  Facial symmetry is present.  The patient has good sensation of face to pinprick and soft touch bilaterally.  She has good strength of facial muscle and muscles of head turning and shoulder shrug bilaterally.  Motor testing reveals some slight weakness in the intrinsic muscles of the hands bilaterally, otherwise good strength with the arms is seen bilaterally.  With lower extremities, the patient is not able lift either leg up off the bed independently.  With support of the knee, the patient can extend the knee and lift the leg up with fairly good strength bilaterally.  The patient has good distal strength with dorsi and plantarflexion of the right foot.  Did not test the left secondary to pain.  The patient has some alteration with pinprick sensation on both feet.  Vibration sensation is depressed on the right foot as compared to the left. Position sense is surprisingly normal on all fours.  The patient has fairly good pinprick and vibratory sensation in the arms.  The patient has good finger-nose-finger bilaterally.  Cannot perform toe-finger with either lower extremity due to weakness.  Deep tendon reflexes are depressed in the legs.  Toes are neutral bilaterally.  The patient has fairly good reflex in the left biceps, trace in the right biceps, triceps are depressed bilaterally.  The patient was not ambulated.  LABORATORY VALUES:  Notable for white count of 10.2, hemoglobin of 12.7, hematocrit of 36.5, MCV of 85.5, and platelets of 288.  Sodium 139, potassium 4.0, chloride of 103, CO2 of  29, glucose of 135, BUN of 13, creatinine of 0.79, and calcium of 9.7.  Chest x-ray of the left ankle shows no fracture.  IMPRESSION: 1. Guilian-Barre syndrome, residual weakness and numbness. 2. Gait disorder secondary to Guilian-Barre syndrome, residual     weakness and numbness. 3. Diabetes.  This patient has fallen and has injured her left ankle.  The patient has a very tenuous existence at home.  The patient does have some aide that comes in for several hours a week, but otherwise is by herself.  The patient has enough gait instability that she is at risk for further falls.  The patient does have neuropathic discomfort in the legs.  PLAN: 1. We will increase gabapentin to 300 mg 3 times daily using short-     acting drug. 2. Physical and occupational therapy. 3.  We will check a serum immunoelectrophoresis during this admission. 4. We will follow the patient's clinical course while in-house. In the     last admission, the patient did have MRI studies of the head,     cervical spine, and thoracic spine that were unremarkable.     Jill Alexanders, M.D.     CKW/MEDQ  D:  05/19/2010  T:  05/20/2010  Job:  [id number]  cc:   [facility] Rosalita Chessman, DO  Electronically Signed by Roxy Cedar M.D. on 06/27/2010 08:12:18 AM

## 2010-07-20 ENCOUNTER — Other Ambulatory Visit (HOSPITAL_COMMUNITY): Payer: Self-pay | Admitting: Urology

## 2010-07-20 ENCOUNTER — Ambulatory Visit (HOSPITAL_COMMUNITY)
Admission: RE | Admit: 2010-07-20 | Discharge: 2010-07-20 | Disposition: A | Discharge status: Home / Self Care | Payer: BC Managed Care – PPO | Source: Ambulatory Visit | Attending: Urology | Admitting: Urology

## 2010-07-20 DIAGNOSIS — I700 Atherosclerosis of aorta: Secondary | ICD-10-CM | POA: Insufficient documentation

## 2010-07-20 DIAGNOSIS — Z87891 Personal history of nicotine dependence: Secondary | ICD-10-CM | POA: Insufficient documentation

## 2010-07-20 DIAGNOSIS — C649 Malignant neoplasm of unspecified kidney, except renal pelvis: Secondary | ICD-10-CM | POA: Insufficient documentation

## 2010-08-10 ENCOUNTER — Ambulatory Visit: Payer: BC Managed Care – PPO | Attending: Neurology | Admitting: Physical Therapy

## 2010-08-10 DIAGNOSIS — IMO0001 Reserved for inherently not codable concepts without codable children: Secondary | ICD-10-CM | POA: Insufficient documentation

## 2010-08-10 DIAGNOSIS — R269 Unspecified abnormalities of gait and mobility: Secondary | ICD-10-CM | POA: Insufficient documentation

## 2010-08-10 DIAGNOSIS — M6281 Muscle weakness (generalized): Secondary | ICD-10-CM | POA: Insufficient documentation

## 2010-08-21 ENCOUNTER — Ambulatory Visit: Payer: BC Managed Care – PPO | Attending: Neurology | Admitting: Physical Therapy

## 2010-08-21 DIAGNOSIS — IMO0001 Reserved for inherently not codable concepts without codable children: Secondary | ICD-10-CM | POA: Insufficient documentation

## 2010-08-21 DIAGNOSIS — M6281 Muscle weakness (generalized): Secondary | ICD-10-CM | POA: Insufficient documentation

## 2010-08-21 DIAGNOSIS — R269 Unspecified abnormalities of gait and mobility: Secondary | ICD-10-CM | POA: Insufficient documentation

## 2010-08-28 ENCOUNTER — Ambulatory Visit: Payer: BC Managed Care – PPO | Admitting: Physical Therapy

## 2010-08-28 ENCOUNTER — Ambulatory Visit: Payer: BC Managed Care – PPO | Attending: Family Medicine | Admitting: Physical Therapy

## 2010-08-28 DIAGNOSIS — M25676 Stiffness of unspecified foot, not elsewhere classified: Secondary | ICD-10-CM | POA: Insufficient documentation

## 2010-08-28 DIAGNOSIS — M25673 Stiffness of unspecified ankle, not elsewhere classified: Secondary | ICD-10-CM | POA: Insufficient documentation

## 2010-08-28 DIAGNOSIS — M6281 Muscle weakness (generalized): Secondary | ICD-10-CM | POA: Insufficient documentation

## 2010-08-28 DIAGNOSIS — IMO0001 Reserved for inherently not codable concepts without codable children: Secondary | ICD-10-CM | POA: Insufficient documentation

## 2010-08-28 DIAGNOSIS — M25579 Pain in unspecified ankle and joints of unspecified foot: Secondary | ICD-10-CM | POA: Insufficient documentation

## 2010-08-31 ENCOUNTER — Ambulatory Visit: Payer: BC Managed Care – PPO | Admitting: Physical Therapy

## 2010-09-04 ENCOUNTER — Ambulatory Visit: Payer: BC Managed Care – PPO | Admitting: Physical Therapy

## 2010-09-07 ENCOUNTER — Ambulatory Visit: Payer: BC Managed Care – PPO | Admitting: Physical Therapy

## 2010-09-08 ENCOUNTER — Ambulatory Visit: Payer: BC Managed Care – PPO | Admitting: Physical Therapy

## 2010-09-11 ENCOUNTER — Ambulatory Visit: Payer: BC Managed Care – PPO | Admitting: Physical Therapy

## 2010-09-14 ENCOUNTER — Ambulatory Visit: Payer: BC Managed Care – PPO | Attending: Family Medicine | Admitting: Physical Therapy

## 2010-09-14 DIAGNOSIS — M25676 Stiffness of unspecified foot, not elsewhere classified: Secondary | ICD-10-CM | POA: Insufficient documentation

## 2010-09-14 DIAGNOSIS — M6281 Muscle weakness (generalized): Secondary | ICD-10-CM | POA: Insufficient documentation

## 2010-09-14 DIAGNOSIS — M25673 Stiffness of unspecified ankle, not elsewhere classified: Secondary | ICD-10-CM | POA: Insufficient documentation

## 2010-09-14 DIAGNOSIS — M25579 Pain in unspecified ankle and joints of unspecified foot: Secondary | ICD-10-CM | POA: Insufficient documentation

## 2010-09-14 DIAGNOSIS — IMO0001 Reserved for inherently not codable concepts without codable children: Secondary | ICD-10-CM | POA: Insufficient documentation

## 2010-09-18 ENCOUNTER — Ambulatory Visit: Payer: BC Managed Care – PPO | Admitting: Physical Therapy

## 2010-09-21 ENCOUNTER — Ambulatory Visit: Payer: BC Managed Care – PPO | Admitting: *Deleted

## 2010-09-21 ENCOUNTER — Ambulatory Visit: Payer: BC Managed Care – PPO | Admitting: Physical Therapy

## 2010-10-02 ENCOUNTER — Ambulatory Visit: Payer: BC Managed Care – PPO | Admitting: Physical Therapy

## 2010-10-04 ENCOUNTER — Ambulatory Visit: Payer: BC Managed Care – PPO | Admitting: Physical Therapy

## 2010-10-09 ENCOUNTER — Ambulatory Visit: Payer: BC Managed Care – PPO | Admitting: Physical Therapy

## 2010-10-11 ENCOUNTER — Ambulatory Visit: Payer: BC Managed Care – PPO | Admitting: Physical Therapy

## 2010-10-12 ENCOUNTER — Ambulatory Visit: Payer: BC Managed Care – PPO | Admitting: Physical Therapy

## 2010-10-18 ENCOUNTER — Other Ambulatory Visit: Payer: Self-pay | Admitting: Family Medicine

## 2010-10-18 ENCOUNTER — Ambulatory Visit: Payer: BC Managed Care – PPO | Admitting: Physical Therapy

## 2010-10-19 ENCOUNTER — Ambulatory Visit: Payer: BC Managed Care – PPO | Admitting: Physical Therapy

## 2010-10-20 ENCOUNTER — Ambulatory Visit: Payer: BC Managed Care – PPO | Admitting: Physical Therapy

## 2010-10-24 ENCOUNTER — Ambulatory Visit: Payer: BC Managed Care – PPO | Attending: Neurology | Admitting: Physical Therapy

## 2010-10-24 DIAGNOSIS — M6281 Muscle weakness (generalized): Secondary | ICD-10-CM | POA: Insufficient documentation

## 2010-10-24 DIAGNOSIS — IMO0001 Reserved for inherently not codable concepts without codable children: Secondary | ICD-10-CM | POA: Insufficient documentation

## 2010-10-24 DIAGNOSIS — R269 Unspecified abnormalities of gait and mobility: Secondary | ICD-10-CM | POA: Insufficient documentation

## 2010-10-26 ENCOUNTER — Ambulatory Visit: Payer: BC Managed Care – PPO | Admitting: Physical Therapy

## 2010-10-31 ENCOUNTER — Ambulatory Visit: Payer: BC Managed Care – PPO | Admitting: Physical Therapy

## 2010-11-02 ENCOUNTER — Ambulatory Visit: Payer: BC Managed Care – PPO | Admitting: Physical Therapy

## 2010-11-07 ENCOUNTER — Ambulatory Visit: Payer: BC Managed Care – PPO | Admitting: Physical Therapy

## 2010-11-09 ENCOUNTER — Ambulatory Visit: Payer: BC Managed Care – PPO | Admitting: Physical Therapy

## 2010-11-16 ENCOUNTER — Other Ambulatory Visit (INDEPENDENT_AMBULATORY_CARE_PROVIDER_SITE_OTHER): Payer: BC Managed Care – PPO | Admitting: Family Medicine

## 2010-11-16 ENCOUNTER — Ambulatory Visit: Payer: BC Managed Care – PPO | Attending: Neurology | Admitting: Physical Therapy

## 2010-11-16 DIAGNOSIS — IMO0001 Reserved for inherently not codable concepts without codable children: Secondary | ICD-10-CM | POA: Insufficient documentation

## 2010-11-16 DIAGNOSIS — R269 Unspecified abnormalities of gait and mobility: Secondary | ICD-10-CM | POA: Insufficient documentation

## 2010-11-16 DIAGNOSIS — M6281 Muscle weakness (generalized): Secondary | ICD-10-CM | POA: Insufficient documentation

## 2010-11-16 DIAGNOSIS — R109 Unspecified abdominal pain: Secondary | ICD-10-CM

## 2010-11-16 LAB — POCT URINALYSIS DIPSTICK
Ketones, UA: NEGATIVE
Leukocytes, UA: NEGATIVE
pH, UA: 5

## 2010-11-16 MED ORDER — METFORMIN HCL 500 MG PO TABS: 500.0000 mg | ORAL_TABLET | Freq: Two times a day (BID) | ORAL | Status: DC

## 2010-11-20 ENCOUNTER — Other Ambulatory Visit: Payer: Self-pay | Admitting: Family Medicine

## 2010-11-20 ENCOUNTER — Ambulatory Visit: Payer: BC Managed Care – PPO | Admitting: Physical Therapy

## 2010-11-21 ENCOUNTER — Ambulatory Visit: Payer: BC Managed Care – PPO | Admitting: Physical Therapy

## 2010-11-27 ENCOUNTER — Telehealth: Payer: Self-pay | Admitting: *Deleted

## 2010-11-27 ENCOUNTER — Ambulatory Visit: Payer: BC Managed Care – PPO | Admitting: Physical Therapy

## 2010-11-27 MED ORDER — CARISOPRODOL 350 MG PO TABS: 350.0000 mg | ORAL_TABLET | Freq: Four times a day (QID) | ORAL | Status: AC | PRN

## 2010-11-27 MED ORDER — LISINOPRIL 10 MG PO TABS: 10.0000 mg | ORAL_TABLET | Freq: Every day | ORAL | Status: DC

## 2010-11-27 MED ORDER — OMEPRAZOLE 10 MG PO CPDR: 10.0000 mg | DELAYED_RELEASE_CAPSULE | Freq: Every day | ORAL | Status: DC

## 2010-11-27 NOTE — Telephone Encounter (Signed)
Ok to refill all for 90 days only

## 2010-11-27 NOTE — Telephone Encounter (Signed)
Rfs sent

## 2010-11-27 NOTE — Telephone Encounter (Signed)
rec Rf req from [facility] for Carisoprodol 350mg , Omeprazole 20mg  and lisinopril 10mg . Pt's last OV was 01/2010 and has existing appt scheduled for 12-11-2010. Please advise. Ok to Rf?

## 2010-11-27 NOTE — Telephone Encounter (Signed)
Soma 350  #40  1 po qid prn  Omeprazole 20 mg  #30  1 po qd  11 refills

## 2010-11-27 NOTE — Telephone Encounter (Signed)
Please advise on sig for Carisoprodol and Omeprazole. Meds are not active in [place] or Epic.

## 2010-11-29 ENCOUNTER — Ambulatory Visit: Payer: BC Managed Care – PPO | Admitting: Physical Therapy

## 2010-12-05 ENCOUNTER — Ambulatory Visit: Payer: BC Managed Care – PPO | Admitting: Physical Therapy

## 2010-12-07 ENCOUNTER — Ambulatory Visit: Payer: BC Managed Care – PPO | Admitting: Physical Therapy

## 2010-12-11 ENCOUNTER — Ambulatory Visit: Payer: BC Managed Care – PPO | Admitting: Family Medicine

## 2010-12-11 ENCOUNTER — Other Ambulatory Visit (HOSPITAL_COMMUNITY): Payer: BC Managed Care – PPO

## 2010-12-12 ENCOUNTER — Other Ambulatory Visit: Payer: Self-pay | Admitting: Urology

## 2010-12-12 ENCOUNTER — Encounter (HOSPITAL_COMMUNITY): Payer: BC Managed Care – PPO

## 2010-12-12 ENCOUNTER — Ambulatory Visit: Payer: BC Managed Care – PPO | Admitting: Physical Therapy

## 2010-12-12 LAB — BASIC METABOLIC PANEL
BUN: 21 mg/dL (ref 6–23)
Creatinine, Ser: 0.83 mg/dL (ref 0.50–1.10)
GFR calc Af Amer: 86 mL/min — ABNORMAL LOW (ref >90)
GFR calc non Af Amer: 74 mL/min — ABNORMAL LOW (ref >90)
Potassium: 4.3 mEq/L (ref 3.5–5.1)

## 2010-12-12 LAB — SURGICAL PCR SCREEN
MRSA, PCR: NEGATIVE
Staphylococcus aureus: NEGATIVE

## 2010-12-12 LAB — CBC
HCT: 34.6 % — ABNORMAL LOW (ref 36.0–46.0)
MCHC: 33.8 g/dL (ref 30.0–36.0)
RDW: 13.5 % (ref 11.5–15.5)

## 2010-12-14 ENCOUNTER — Ambulatory Visit (HOSPITAL_COMMUNITY)
Admission: RE | Admit: 2010-12-14 | Discharge: 2010-12-14 | Disposition: A | Discharge status: Home / Self Care | Payer: BC Managed Care – PPO | Source: Ambulatory Visit | Attending: Urology | Admitting: Urology

## 2010-12-14 ENCOUNTER — Other Ambulatory Visit: Payer: Self-pay | Admitting: Urology

## 2010-12-14 ENCOUNTER — Ambulatory Visit: Admit: 2010-12-14 | Payer: Self-pay | Admitting: Urology

## 2010-12-14 DIAGNOSIS — M129 Arthropathy, unspecified: Secondary | ICD-10-CM | POA: Insufficient documentation

## 2010-12-14 DIAGNOSIS — K219 Gastro-esophageal reflux disease without esophagitis: Secondary | ICD-10-CM | POA: Insufficient documentation

## 2010-12-14 DIAGNOSIS — R82998 Other abnormal findings in urine: Secondary | ICD-10-CM | POA: Insufficient documentation

## 2010-12-14 DIAGNOSIS — F329 Major depressive disorder, single episode, unspecified: Secondary | ICD-10-CM | POA: Insufficient documentation

## 2010-12-14 DIAGNOSIS — E119 Type 2 diabetes mellitus without complications: Secondary | ICD-10-CM | POA: Insufficient documentation

## 2010-12-14 DIAGNOSIS — Z8551 Personal history of malignant neoplasm of bladder: Secondary | ICD-10-CM | POA: Insufficient documentation

## 2010-12-14 DIAGNOSIS — F3289 Other specified depressive episodes: Secondary | ICD-10-CM | POA: Insufficient documentation

## 2010-12-14 DIAGNOSIS — I10 Essential (primary) hypertension: Secondary | ICD-10-CM | POA: Insufficient documentation

## 2010-12-14 DIAGNOSIS — N3020 Other chronic cystitis without hematuria: Secondary | ICD-10-CM | POA: Insufficient documentation

## 2010-12-14 LAB — GLUCOSE, CAPILLARY: Glucose-Capillary: 143 mg/dL — ABNORMAL HIGH (ref 70–99)

## 2010-12-14 SURGERY — CYSTOSCOPY, WITH RETROGRADE PYELOGRAM
Anesthesia: Choice

## 2010-12-15 NOTE — Op Note (Signed)
NAME:  Nicole Strong, Nicole Strong NO.:  [id number]  MEDICAL RECORD NO.:  [id number]  LOCATION:  [facility]                         FACILITY:  [facility]  PHYSICIAN:  Raynelle Bring, MD      DATE OF BIRTH:  12/23/1950  DATE OF PROCEDURE:  12/14/2010 DATE OF DISCHARGE:  12/14/2010                              OPERATIVE REPORT   PREOPERATIVE DIAGNOSES: 1. History of urothelial carcinoma of the bladder. 2. Positive urine tumor markers.  POSTOPERATIVE DIAGNOSES: 1. History of urothelial carcinoma of the bladder. 2. Positive urine tumor markers.  PROCEDURES: 1. Cystoscopy. 2. Right retrograde pyelography. 3. Site selected bladder biopsies.  SURGEON:  Raynelle Bring, MD  ANESTHESIA:  General.  COMPLICATIONS:  None.  INTRAOPERATIVE FINDINGS:  Right retrograde pyelography revealed a normal caliber ureter without filling defects.  The renal collecting system also appeared to be without filling defects or abnormalities.  SPECIMENS: 1. Posterior bladder biopsy. 2. Right bladder biopsy. 3. Left bladder biopsy.  DISPOSITION OF SPECIMENS:  To Pathology.  INDICATION:  Nicole Strong is a 60 year old female who was noted to have an incidentally detected 4.8 cm renal mass on the left side, which was treated with a left laparoscopic nephrectomy approximately 1 year ago. Due to the fact that she did have a history of bladder cancer, her left ureter was removed.  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  It was therefore recommended that she proceed with the above procedures.  The potential risks, complications, and alternative treatment options as well as the expected postoperative course was discussed with the patient and informed  consent obtained.  DESCRIPTION OF PROCEDURE:  The patient was taken to the operating room and a general anesthetic was administered.  She was given preoperative antibiotics, placed in the dorsal lithotomy position, and prepped and draped in the usual sterile fashion.  Next, a preoperative time-out was performed.  Cystourethroscopy was then performed and the bladder was systematically examined with a 12 and 70-degree lens.  This revealed no evidence of any obvious bladder tumors.  There was some erythematous changes located near the right ureteral orifice towards the right hemitrigone.  There was also some very mild and  nonsuspicious erythema located towards the posterior bladder.  In addition, her previous resection site was identified and appeared without abnormalities.  A 6- [place] ureteral catheter was then used to intubate the right ureteral orifice and Omnipaque contrast was injected.  Findings are as dictated above.  There were no abnormalities to suggest tumor recurrence within the right upper urinary tract.  Attention then returned to the bladder and a cold cup biopsy forceps was used to biopsy the posterior erythematous area as well as the erythematous area near the right hemitrigone.  Another biopsy was also performed near the previous resection site, which was located towards the left hemitrigone. Hemostasis was then achieved with electrocautery and a Bugbee electrode. The bladder was emptied and reinspected, and hemostasis  appeared excellent.  She tolerated the procedure well without complications.  She was able to be awakened and transferred to the recovery unit in satisfactory condition.     Raynelle Bring, MD     LB/MEDQ  D:  12/14/2010  T:  12/14/2010  Job:  [id number]  Electronically Signed by Raynelle Bring MD on 12/15/2010 09:20:29 PM

## 2011-01-01 ENCOUNTER — Encounter: Payer: Self-pay | Admitting: Family Medicine

## 2011-01-02 ENCOUNTER — Ambulatory Visit: Payer: BC Managed Care – PPO | Admitting: Family Medicine

## 2011-01-03 ENCOUNTER — Ambulatory Visit: Payer: BC Managed Care – PPO | Admitting: Physical Therapy

## 2011-01-08 ENCOUNTER — Ambulatory Visit: Payer: BC Managed Care – PPO | Admitting: Family Medicine

## 2011-01-18 ENCOUNTER — Ambulatory Visit (INDEPENDENT_AMBULATORY_CARE_PROVIDER_SITE_OTHER): Payer: BC Managed Care – PPO | Admitting: Family Medicine

## 2011-01-18 ENCOUNTER — Encounter: Payer: Self-pay | Admitting: Family Medicine

## 2011-01-18 DIAGNOSIS — E119 Type 2 diabetes mellitus without complications: Secondary | ICD-10-CM

## 2011-01-18 DIAGNOSIS — I10 Essential (primary) hypertension: Secondary | ICD-10-CM

## 2011-01-18 DIAGNOSIS — E785 Hyperlipidemia, unspecified: Secondary | ICD-10-CM

## 2011-01-18 DIAGNOSIS — G610 Guillain-Barre syndrome: Secondary | ICD-10-CM

## 2011-01-18 NOTE — Assessment & Plan Note (Signed)
Per neuro 

## 2011-01-18 NOTE — Patient Instructions (Signed)
Guillain-Barre Syndrome Guillain-Barr syndrome is a disorder in which the body's protection (immune) system attacks part of the nervous system. This syndrome is rare. Sharlyn Bologna is called a syndrome rather than a disease because it is not clear that a specific disease-causing agent is involved. CAUSES   Usually Guillain-Barr occurs a few days or weeks after the patient has had symptoms of a viral infection:   Breathing (respiratory).   Stomach (gastrointestinal).   Sometimes, surgery or vaccinations will set off the syndrome.   The disorder can develop over the course of hours or days. Or it may take up to 3 to 4 weeks.   No one yet knows why Guillain-Barr strikes some people and not others. Nor do they know what sets the disease in motion.   What scientists do know is that the body's immune system begins to attack the body itself. This causes what is known as an autoimmune disease.  SYMPTOMS  The first problems (symptoms) of this disorder include varying degrees of weakness or tingling sensations (feeling) in the legs. In many cases, the weakness and abnormal sensations spread to the arms and upper body. These symptoms may worsen until the muscles cannot be used at all. Then the patient is almost totally paralyzed. In these cases, the disorder is life-threatening. It is considered a medical emergency. The patient is often put on a respirator to assist with breathing. Most patients recover from even the most severe cases of this syndrome. But some continue to have some degree of weakness. DIAGNOSIS  Sharlyn Bologna is called a syndrome rather than a disease because it is not clear that a specific disease-causing agent is involved. Reflexes such as knee jerks are usually lost. Signals traveling along the nerve are slower. So a nerve conduction velocity (NCV) test can give caregivers clues to aid the diagnosis. This means they try to learn what is wrong. The cerebrospinal fluid (CSF) that  bathes the spinal cord and brain contains more protein than usual. So a caregiver may decide to perform a spinal tap. TREATMENT  There is no known cure for this syndrome. But treatments can give some relief and speed up the recovery in most patients. There are also a number of ways to treat the complications of the syndrome. Currently, plasmapheresis and high-dose immunoglobulin therapy are used. Plasmapheresis seems to reduce the severity and length of the Guillain-Barr episode. In high-dose immunoglobulin therapy, caregivers give intravenous injections of the proteins that, in small quantities, the immune system uses naturally to attack invading organisms. Researchers have found that giving high doses of these immunoglobulins to patients can lessen the immune system attack on the nervous system. The most important part of the treatment for this syndrome is keeping the patient's body functioning during recovery of the nervous system. This can sometimes require placing the patient on:  A respirator.   A heart monitor.   Other machines that assist body function.  This syndrome can be devastating due to its sudden and unexpected beginning. Most people reach the stage of greatest weakness within the first 2 weeks after symptoms appear. By the third week of the illness, 90 percent of all patients are at their weakest. The recovery period may be as little as a few weeks. Or it can be as long as a few years. About 30 percent of patients still have a remaining weakness after 3 years. About 3 percent may suffer a return of muscle weakness and tingling sensations many years after the initial attack. RESEARCH BEING  DONE Scientists are concentrating on finding new treatments and refining existing ones. They are also looking at the workings of the immune system. They want to find which cells are responsible for beginning and carrying out the attack on the nervous system. The fact that so many cases of this syndrome  begin after a viral or germ (bacterial ) infection suggests that certain characteristics of some viruses and bacteria may activate the immune system improperly. Investigators are searching for those characteristics. Neurological scientists, immunologists, virologists, and pharmacologists are all working together:  To learn how to prevent this disorder.   To make better therapies available when it strikes.  FOR MORE INFORMATION Guillain-Barre Syndrome [employer] www.gbs-cipd.org Document Released: 01/19/2002 Document Revised: 10/11/2010 Document Reviewed: 01/29/2005 [facility] Patient Information 2012 [place].

## 2011-01-18 NOTE — Assessment & Plan Note (Signed)
Check labs con't meds 

## 2011-01-18 NOTE — Assessment & Plan Note (Signed)
Check labs 

## 2011-01-18 NOTE — Assessment & Plan Note (Signed)
Off meds con't to monitor

## 2011-01-18 NOTE — Progress Notes (Signed)
  Subjective:    Patient ID: Nicole Strong, female    DOB: Oct 01, 1950, 60 y.o.   MRN: [id number]  HPI Pt here to check in with [place] since being dx with GBS.  Pt is still in wheelchair--she can stand but struggles to walk.  See hospital and neuro records.   Review of Systems As above    Objective:   Physical Exam  Constitutional: She is oriented to person, place, and time. She appears well-developed and well-nourished.  Cardiovascular: Normal rate and regular rhythm.   No murmur heard. Pulmonary/Chest: Effort normal and breath sounds normal. No respiratory distress. She has no wheezes. She has no rales. She exhibits no tenderness.  Neurological: She is alert and oriented to person, place, and time.  Psychiatric: She has a normal mood and affect. Her behavior is normal. Judgment normal.          Assessment & Plan:

## 2011-02-08 ENCOUNTER — Ambulatory Visit (HOSPITAL_COMMUNITY)
Admission: RE | Admit: 2011-02-08 | Discharge: 2011-02-08 | Disposition: A | Discharge status: Home / Self Care | Payer: BC Managed Care – PPO | Source: Ambulatory Visit | Attending: Urology | Admitting: Urology

## 2011-02-08 ENCOUNTER — Other Ambulatory Visit (HOSPITAL_COMMUNITY): Payer: Self-pay | Admitting: Urology

## 2011-02-08 DIAGNOSIS — C649 Malignant neoplasm of unspecified kidney, except renal pelvis: Secondary | ICD-10-CM

## 2011-02-13 ENCOUNTER — Other Ambulatory Visit: Payer: Self-pay | Admitting: Family Medicine

## 2011-02-14 ENCOUNTER — Telehealth: Payer: Self-pay | Admitting: *Deleted

## 2011-02-14 NOTE — Telephone Encounter (Signed)
Called patient to let her know that her refill request has been  Sent to her pharmacy. Also noted that she had not had her lab work drawn as requested by Dr. Etter Sjogren, patient agreed to schedule a lab appointment.

## 2011-02-19 ENCOUNTER — Other Ambulatory Visit (INDEPENDENT_AMBULATORY_CARE_PROVIDER_SITE_OTHER): Payer: BC Managed Care – PPO

## 2011-02-19 ENCOUNTER — Other Ambulatory Visit: Payer: Self-pay | Admitting: Family Medicine

## 2011-02-19 DIAGNOSIS — E785 Hyperlipidemia, unspecified: Secondary | ICD-10-CM

## 2011-02-19 DIAGNOSIS — I10 Essential (primary) hypertension: Secondary | ICD-10-CM

## 2011-02-19 DIAGNOSIS — E119 Type 2 diabetes mellitus without complications: Secondary | ICD-10-CM

## 2011-02-19 LAB — HEPATIC FUNCTION PANEL
Alkaline Phosphatase: 85 U/L (ref 39–117)
Bilirubin, Direct: 0 mg/dL (ref 0.0–0.3)
Total Bilirubin: 0.6 mg/dL (ref 0.3–1.2)
Total Protein: 7 g/dL (ref 6.0–8.3)

## 2011-02-19 LAB — BASIC METABOLIC PANEL
BUN: 20 mg/dL (ref 6–23)
CO2: 29 mEq/L (ref 19–32)
Chloride: 105 mEq/L (ref 96–112)
Glucose, Bld: 172 mg/dL — ABNORMAL HIGH (ref 70–99)
Potassium: 5 mEq/L (ref 3.5–5.1)

## 2011-02-19 LAB — LIPID PANEL
Total CHOL/HDL Ratio: 4
VLDL: 39.6 mg/dL (ref 0.0–40.0)

## 2011-02-19 LAB — HEMOGLOBIN A1C: Hgb A1c MFr Bld: 7.3 % — ABNORMAL HIGH (ref 4.6–6.5)

## 2011-03-07 ENCOUNTER — Other Ambulatory Visit: Payer: Self-pay

## 2011-03-07 MED ORDER — METFORMIN HCL 1000 MG PO TABS: 1000.0000 mg | ORAL_TABLET | Freq: Two times a day (BID) | ORAL | Status: DC

## 2011-03-07 MED ORDER — EZETIMIBE 10 MG PO TABS: 10.0000 mg | ORAL_TABLET | Freq: Every day | ORAL | Status: DC

## 2011-05-07 ENCOUNTER — Telehealth: Payer: Self-pay | Admitting: Family Medicine

## 2011-05-07 NOTE — Telephone Encounter (Signed)
Refill for  Welchol Tabs 625MG  Qty 90 Refills 4 I do not see in chart

## 2011-07-24 ENCOUNTER — Other Ambulatory Visit: Payer: Self-pay | Admitting: Family Medicine

## 2011-08-03 ENCOUNTER — Other Ambulatory Visit: Payer: Self-pay | Admitting: Family Medicine

## 2011-08-20 ENCOUNTER — Other Ambulatory Visit (HOSPITAL_COMMUNITY): Payer: Self-pay | Admitting: Urology

## 2011-08-20 ENCOUNTER — Ambulatory Visit (HOSPITAL_COMMUNITY)
Admission: RE | Admit: 2011-08-20 | Discharge: 2011-08-20 | Disposition: A | Discharge status: Home / Self Care | Payer: BC Managed Care – PPO | Source: Ambulatory Visit | Attending: Urology | Admitting: Urology

## 2011-08-20 DIAGNOSIS — C649 Malignant neoplasm of unspecified kidney, except renal pelvis: Secondary | ICD-10-CM

## 2011-09-21 ENCOUNTER — Other Ambulatory Visit: Payer: Self-pay | Admitting: Family Medicine

## 2011-09-24 NOTE — Telephone Encounter (Signed)
patient is not taking the welchol she is on Zetia and labs are due Rx declined      KP

## 2011-09-24 NOTE — Telephone Encounter (Signed)
Left message to call office to verify that Pt is not taking med, per last labs Pt refuse this med and opt to take zetia instead.

## 2011-09-25 NOTE — Telephone Encounter (Signed)
Pt returned call, advised she needed labs before rx could be filled pt stated she has to arrange transportation, gave her lab hours and she stated she would call her ride and see what works best for her and call me back to schedule. Once she does I will add to note Thanks

## 2011-09-25 NOTE — Telephone Encounter (Signed)
Discussed with patient and made her aware the Welchol was requested but since she was on the Zetia we denied the Rx. She said she had enough Zetia to last her until her next office visit  which she will call back to scheduled when she finds a ride.  I made the patient aware not to run out of medication and she voice understanding. She will call back to schedule an OV with fasting labs.     KP

## 2011-10-11 ENCOUNTER — Other Ambulatory Visit: Payer: Self-pay | Admitting: Family Medicine

## 2011-10-11 NOTE — Telephone Encounter (Signed)
Please offer this patient an apt.      KP 

## 2011-10-12 NOTE — Telephone Encounter (Signed)
Patient scheduled CPE for November

## 2011-11-01 ENCOUNTER — Other Ambulatory Visit: Payer: Self-pay | Admitting: Family Medicine

## 2011-11-01 NOTE — Telephone Encounter (Signed)
Patient has a physical scheduled for November does she need to come before then

## 2011-11-01 NOTE — Telephone Encounter (Signed)
Please offer this patient an apt with Fasting labs.   Thank You

## 2011-12-03 ENCOUNTER — Encounter (HOSPITAL_COMMUNITY): Payer: Self-pay | Admitting: *Deleted

## 2011-12-03 ENCOUNTER — Emergency Department (HOSPITAL_COMMUNITY): Payer: BC Managed Care – PPO

## 2011-12-03 ENCOUNTER — Emergency Department (HOSPITAL_COMMUNITY)
Admission: EM | Admit: 2011-12-03 | Discharge: 2011-12-03 | Disposition: A | Discharge status: Home / Self Care | Payer: BC Managed Care – PPO | Attending: Emergency Medicine | Admitting: Emergency Medicine

## 2011-12-03 DIAGNOSIS — N200 Calculus of kidney: Secondary | ICD-10-CM | POA: Insufficient documentation

## 2011-12-03 DIAGNOSIS — R197 Diarrhea, unspecified: Secondary | ICD-10-CM | POA: Insufficient documentation

## 2011-12-03 DIAGNOSIS — Z79899 Other long term (current) drug therapy: Secondary | ICD-10-CM | POA: Insufficient documentation

## 2011-12-03 DIAGNOSIS — C9200 Acute myeloblastic leukemia, not having achieved remission: Secondary | ICD-10-CM | POA: Insufficient documentation

## 2011-12-03 DIAGNOSIS — E785 Hyperlipidemia, unspecified: Secondary | ICD-10-CM | POA: Insufficient documentation

## 2011-12-03 DIAGNOSIS — Z87891 Personal history of nicotine dependence: Secondary | ICD-10-CM | POA: Insufficient documentation

## 2011-12-03 DIAGNOSIS — I10 Essential (primary) hypertension: Secondary | ICD-10-CM | POA: Insufficient documentation

## 2011-12-03 LAB — URINALYSIS, ROUTINE W REFLEX MICROSCOPIC
Bilirubin Urine: NEGATIVE
Hgb urine dipstick: NEGATIVE
Protein, ur: NEGATIVE mg/dL
Urobilinogen, UA: 0.2 mg/dL (ref 0.0–1.0)

## 2011-12-03 LAB — CBC WITH DIFFERENTIAL/PLATELET
Basophils Absolute: 0 10*3/uL (ref 0.0–0.1)
HCT: 35.1 % — ABNORMAL LOW (ref 36.0–46.0)
Lymphocytes Relative: 20 % (ref 12–46)
Monocytes Absolute: 0.4 10*3/uL (ref 0.1–1.0)
Neutro Abs: 4.9 10*3/uL (ref 1.7–7.7)
RDW: 13.1 % (ref 11.5–15.5)
WBC: 6.8 10*3/uL (ref 4.0–10.5)

## 2011-12-03 LAB — COMPREHENSIVE METABOLIC PANEL
ALT: 30 U/L (ref 0–35)
AST: 20 U/L (ref 0–37)
Alkaline Phosphatase: 106 U/L (ref 39–117)
CO2: 26 mEq/L (ref 19–32)
Chloride: 95 mEq/L — ABNORMAL LOW (ref 96–112)
Creatinine, Ser: 0.83 mg/dL (ref 0.50–1.10)
GFR calc non Af Amer: 73 mL/min — ABNORMAL LOW (ref >90)
Sodium: 131 mEq/L — ABNORMAL LOW (ref 135–145)
Total Bilirubin: 0.5 mg/dL (ref 0.3–1.2)

## 2011-12-03 MED ORDER — PROMETHAZINE HCL 25 MG/ML IJ SOLN
12.5000 mg | Freq: Once | INTRAMUSCULAR | Status: AC
  Administered 2011-12-03: 12.5 mg via INTRAVENOUS

## 2011-12-03 MED ORDER — PROMETHAZINE HCL 25 MG/ML IJ SOLN
12.5000 mg | Freq: Once | INTRAMUSCULAR | Status: DC
  Filled 2011-12-03: qty 1

## 2011-12-03 MED ORDER — SODIUM CHLORIDE 0.9 % IV BOLUS (SEPSIS)
1000.0000 mL | Freq: Once | INTRAVENOUS | Status: AC
  Administered 2011-12-03: 1000 mL via INTRAVENOUS

## 2011-12-03 MED ORDER — METRONIDAZOLE 500 MG PO TABS: 500.0000 mg | ORAL_TABLET | Freq: Three times a day (TID) | ORAL | Status: DC

## 2011-12-03 MED ORDER — PROMETHAZINE HCL 25 MG PO TABS: 25.0000 mg | ORAL_TABLET | Freq: Four times a day (QID) | ORAL | Status: DC | PRN

## 2011-12-03 MED ORDER — IOHEXOL 300 MG/ML  SOLN
100.0000 mL | Freq: Once | INTRAMUSCULAR | Status: AC | PRN
  Administered 2011-12-03: 100 mL via INTRAVENOUS

## 2011-12-03 NOTE — ED Notes (Signed)
Stool sample was not obtained due to mixture of urine with stool

## 2011-12-03 NOTE — ED Provider Notes (Signed)
History     CSN: [id number]  Arrival date & time 12/03/11  0105   First MD Initiated Contact with Patient 12/03/11 0124      Chief Complaint  Patient presents with  . Diarrhea    (Consider location/radiation/quality/duration/timing/severity/associated sxs/prior treatment) HPI History provided by pt.   Pt complains of 7 days of frequent diarrhea.  Having BMs every 15-20 minutes this evening.  Associated w/ nausea and diffuse, constant, crampy lower abdominal pain and rectal soreness.  Denies fever, GU sx and hematochezia/melena.  Denies recent travel and abx use.  Remote h/o colitis that presented similarly.   Has been feeling fatigued and generally weak.   Past Medical History  Diagnosis Date  . Diabetes mellitus     2  . Hyperlipidemia   . Hypertension   . Renal mass     BEING WORKED UP BY UROLOGY    Past Surgical History  Procedure Date  . Cesarean section   . Cholecystectomy   . Nephrectomy     Family History  Problem Relation Age of Onset  . Cancer Father     PROSTATE  . Cancer Sister     BREAST    History  Substance Use Topics  . Smoking status: Former Research scientist (life sciences)  . Smokeless tobacco: Not on file  . Alcohol Use: No    OB History    Grav Para Term Preterm Abortions TAB SAB Ect Mult Living                  Review of Systems  All other systems reviewed and are negative.    Allergies  Codeine and Hydrocodone-acetaminophen  Home Medications   Current Outpatient Rx  Name Route Sig Dispense Refill  . AMITRIPTYLINE HCL 10 MG PO TABS Oral Take 20 mg by mouth at bedtime.      Marland Kitchen CALCIUM CARBONATE 600 MG PO TABS Oral Take 600 mg by mouth 2 (two) times daily with a meal.      . VITAMIN D 1000 UNITS PO TABS Oral Take 1,000 Units by mouth daily.      Marland Kitchen DICLOFENAC POTASSIUM 50 MG PO TABS Oral Take 50 mg by mouth 2 (two) times daily.      Marland Kitchen GABAPENTIN 300 MG PO CAPS Oral Take 300 mg by mouth 3 (three) times daily.      Marland Kitchen GLUCOSE BLOOD VI STRP Other 1 each by  Other route as needed. Use as instructed     . FREESTYLE LANCETS MISC Other 1 each by Other route as needed. Use as instructed..     . LISINOPRIL 10 MG PO TABS  1 tab by mouth daily-- Office visit due now 90 tablet 0  . METFORMIN HCL 1000 MG PO TABS  TAKE 1 TABLET TWICE A DAY WITH MEALS (LABS ARE DUE NOW) 180 tablet 0  . UROGESIC-BLUE 81.6 MG PO TABS Oral Take by mouth 4 (four) times daily.      Marland Kitchen PROMETHAZINE HCL 25 MG PO TABS Oral Take 25 mg by mouth every 6 (six) hours as needed.      Marland Kitchen TRAMADOL HCL 50 MG PO TABS Oral Take 50 mg by mouth every 6 (six) hours as needed. Maximum dose= 8 tablets per day     . ZETIA 10 MG PO TABS  TAKE 1 TABLET DAILY (LABS ARE DUE NOW) 90 each 0    BP 159/98  Pulse 87  Temp 98.2 F (36.8 C) (Oral)  Resp 20  SpO2 100%  Physical Exam  Nursing note and vitals reviewed. Constitutional: She is oriented to person, place, and time. She appears well-developed and well-nourished. No distress.  HENT:  Head: Normocephalic and atraumatic.  Mouth/Throat: Oropharynx is clear and moist.  Eyes:       Normal appearance  Neck: Normal range of motion.  Cardiovascular: Normal rate and regular rhythm.   Pulmonary/Chest: Effort normal and breath sounds normal. No respiratory distress.  Abdominal: Soft. Bowel sounds are normal. She exhibits no distension and no mass. There is no tenderness. There is no rebound and no guarding.       Obese, diffuse lower abdominal tenderness  Genitourinary:       No CVA tenderness.  Perirectal irritation.  Nml rectal tone and stool color.  No gross blood.     Musculoskeletal: Normal range of motion.  Neurological: She is alert and oriented to person, place, and time.  Skin: Skin is warm and dry. No rash noted.  Psychiatric: She has a normal mood and affect. Her behavior is normal.    ED Course  Procedures (including critical care time)  Labs Reviewed  CBC WITH DIFFERENTIAL - Abnormal; Notable for the following:    HCT 35.1 (*)      All other components within normal limits  COMPREHENSIVE METABOLIC PANEL - Abnormal; Notable for the following:    Sodium 131 (*)     Chloride 95 (*)     Glucose, Bld 320 (*)     GFR calc non Af Amer 73 (*)     GFR calc Af Amer 85 (*)     All other components within normal limits  URINALYSIS, ROUTINE W REFLEX MICROSCOPIC - Abnormal; Notable for the following:    Glucose, UA 500 (*)     All other components within normal limits  GLUCOSE, CAPILLARY - Abnormal; Notable for the following:    Glucose-Capillary 258 (*)     All other components within normal limits  OCCULT BLOOD, POC DEVICE  CLOSTRIDIUM DIFFICILE BY PCR   Ct Abdomen Pelvis W Contrast  12/03/2011  *RADIOLOGY REPORT*  Clinical Data: Abdominal pain, diarrhea.  CT ABDOMEN AND PELVIS WITH CONTRAST  Technique:  Multidetector CT imaging of the abdomen and pelvis was performed following the standard protocol during bolus administration of intravenous contrast.  Contrast: 148mL OMNIPAQUE IOHEXOL 300 MG/ML  SOLN  Comparison: 02/09/2011, 11/21/2009 MRI  Findings: Limited images through the lung bases demonstrate no significant appreciable abnormality. The heart size is within normal limits. No pleural or pericardial effusion.  Diffuse low attenuation of the liver.  Absent gallbladder.  No biliary ductal dilatation.  Unremarkable spleen and pancreas. There are bilateral adrenal nodules, similar to prior.  Surgically absent left kidney.  Lobular right renal contour. Homogeneous enhancement.  No hydronephrosis or hydroureter.  Decompressed colon.  Despite this, a segment of sigmoid colon demonstrates wall thickening and mucosal hyperenhancement.  Mild pericolonic fat stranding and mesenteric vessel engorgement. Normal appendix.  No free intraperitoneal air or fluid.  No lymphadenopathy.  There is scattered atherosclerotic calcification of the aorta and its branches. No aneurysmal dilatation.  Partially decompressed bladder with nondependent air.   Unremarkable CT appearance to the uterus and adnexa.  Elevation of the right hemidiaphragm is nonspecific.  Multilevel degenerative changes and mild leftward curvature.  Disc osteophyte complex at L2-3 results in moderate left paracentral canal narrowing. L1-2 disc osteophyte complex results in moderate central canal narrowing.  IMPRESSION: Thickened segment of sigmoid colon is nonspecific with most common differential including  infection, inflammation, or ischemia. Colonoscopy follow-up recommended to exclude an underlying mass.  There is air non dependently within the bladder.  Correlate with recent instrumentation.  Alternatively, fistulous communication with bowel is not excluded.  Hepatic steatosis.  Incompletely characterized adrenal nodules are slightly larger from 2011.  Consider MRI follow-up.   Original Report Authenticated By: Suanne Marker, M.D.      1. Diarrhea       MDM  [phone] F presents w/ diarrhea and diffuse lower abd pain x 1 week.  Specs of blood in stool today.  Remote h/o colitis. On exam, afebrile, no signs of dehydration, diffuse lower abdominal tenderness, no gross red blood in rectum.  Labs sig for positive hemoccult and hyperglycemia.  U/A as well as CT abd/pelvis pending.  Pt declines pain medication at this time but phenergan ordered for nausea.  Receiving IV fluids as well.  2:51 AM     CT shows thickened sigmoid colon.  Results discussed w/ pt.  Will treat w/ flagyl because she could possibly have c.diff based on severity of symptoms.  Will not treat w/ cipro both because she has had blood in stool and she is concerned that it could exacerbate her guillan barre.  Recommended drinking lots of fluids and following up with her PCP as well as GI.  Return precautions discussed.  5:31 AM         Remer Macho, PA 12/03/11 [phone]

## 2011-12-03 NOTE — ED Notes (Signed)
Pt c/o diarrhea since Sunday; watery

## 2011-12-03 NOTE — ED Provider Notes (Signed)
Medical screening examination/treatment/procedure(s) were performed by non-physician practitioner and as supervising physician I was immediately available for consultation/collaboration.   Hoy Morn, MD 12/03/11 0900

## 2011-12-20 ENCOUNTER — Other Ambulatory Visit: Payer: Self-pay | Admitting: Family Medicine

## 2011-12-30 ENCOUNTER — Other Ambulatory Visit: Payer: Self-pay | Admitting: Family Medicine

## 2012-01-08 ENCOUNTER — Encounter: Payer: BC Managed Care – PPO | Admitting: Family Medicine

## 2012-02-20 ENCOUNTER — Ambulatory Visit (HOSPITAL_COMMUNITY)
Admission: RE | Admit: 2012-02-20 | Discharge: 2012-02-20 | Disposition: A | Discharge status: Home / Self Care | Payer: BC Managed Care – PPO | Source: Ambulatory Visit | Attending: Urology | Admitting: Urology

## 2012-02-20 ENCOUNTER — Other Ambulatory Visit (HOSPITAL_COMMUNITY): Payer: Self-pay | Admitting: Urology

## 2012-02-20 DIAGNOSIS — C649 Malignant neoplasm of unspecified kidney, except renal pelvis: Secondary | ICD-10-CM | POA: Insufficient documentation

## 2012-03-05 ENCOUNTER — Encounter: Payer: BC Managed Care – PPO | Admitting: Family Medicine

## 2012-05-10 ENCOUNTER — Other Ambulatory Visit: Payer: Self-pay | Admitting: Neurology

## 2012-05-19 ENCOUNTER — Ambulatory Visit (INDEPENDENT_AMBULATORY_CARE_PROVIDER_SITE_OTHER): Payer: BC Managed Care – PPO | Admitting: Nurse Practitioner

## 2012-05-19 ENCOUNTER — Encounter: Payer: Self-pay | Admitting: Nurse Practitioner

## 2012-05-19 VITALS — BP 156/72 | HR 85

## 2012-05-19 DIAGNOSIS — G589 Mononeuropathy, unspecified: Secondary | ICD-10-CM

## 2012-05-19 DIAGNOSIS — G610 Guillain-Barre syndrome: Secondary | ICD-10-CM

## 2012-05-19 DIAGNOSIS — R269 Unspecified abnormalities of gait and mobility: Secondary | ICD-10-CM

## 2012-05-19 MED ORDER — PROMETHAZINE HCL 25 MG PO TABS: 25.0000 mg | ORAL_TABLET | Freq: Four times a day (QID) | ORAL | Status: DC | PRN

## 2012-05-19 NOTE — Progress Notes (Signed)
HPI: Returns followup her last visit with Dr. Brett Fairy 11/22/2011. She has history of Guillain Barre syndrome, associated with severe residual symptoms. She had 5 plasmapheresis treatments but felt no improvement however her symptoms did not ascend any further. She remains in a wheelchair unable to walk however that is her goal. She has some in-home care. She claims she can do her ADLs she is just slow. She has a burning sensation with flushing in all extremities gabapentin has helped with this.  She has had multiple medical complications in the past. 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 She is wanting to have more plasmapheresis.   ROS:  blurred vision, SOB, ringing in ears, memory loss, insomnia, numbness, weakness, sleepiness, not enough sleep, decreased energy  Physical Exam General: well developed, well nourished obese female , seated in the wheelchair  in no evident distress Head: head normocephalic and atraumatic. Oropharynx benign Neck: supple with no carotid or supraclavicular bruits Cardiovascular: regular rate, no murmurs or rubs Mental fully alert. Oriented to place and time. Recent and remote memory intact. Attention span, concentration and fund of knowledge appropriate. Mood and affect appropriate.  Cranial Nerves: Pupils equal, briskly reactive to light. Extraocular movements full without nystagmus. Visual fields full to confrontation. Hearing intact and symmetric to finger snap. Facial sensation intact. Face, tongue, palate move normally and symmetrically. Neck flexion and extension normal.  Motor: Diffuse weakness in all extremities,4/5 ;   poor grip strength bil.   Sensory.: decreased to   touch and pinprick and vibratory in all extremities.  Coordination: Rapid alternating movements normal. Gait and Station: In wheelchair  Reflexes: absent throughout   ASSESSMENT: Nicole Strong in 2011 with 5 plasmapheresis treatments. No progression but no  alleviation of symptoms. Patient is wanting to repeat the cycle. 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 She can stand but cannot walk unassisted.     PLAN: Discussed with Dr. Brett Fairy who says that patient can have plasmapheresis again. Order placed. Please increase gabapentin to 4 times daily total dose, call us for refills when needed to express scripts. Phenergan to be called in to [facility] Plasmapheresis may be set up  with one of the kidney physicians, Dr. Marval Regal, Dr Justin Mend Followup visit with Dr. Brett Fairy in 3 to 6 months  Dennie Bible, [facility] APRN

## 2012-05-19 NOTE — Patient Instructions (Addendum)
Please increase gabapentin to 4 times daily total dose, call us for refills when needed to [facility]. Phenergan to be called in to [facility] Plasmapheresis may be set up  with one of the kidney physicians, Dr. Marval Regal Dr. Justin Mend Followup visit with Dr. Brett Fairy

## 2012-05-27 ENCOUNTER — Ambulatory Visit: Payer: Self-pay | Admitting: Nurse Practitioner

## 2012-05-30 ENCOUNTER — Observation Stay (HOSPITAL_COMMUNITY)
Admission: EM | Admit: 2012-05-30 | Discharge: 2012-06-01 | Disposition: A | Discharge status: Home / Self Care | Payer: BC Managed Care – PPO | Attending: Internal Medicine | Admitting: Internal Medicine

## 2012-05-30 ENCOUNTER — Encounter (HOSPITAL_COMMUNITY): Payer: Self-pay | Admitting: Emergency Medicine

## 2012-05-30 DIAGNOSIS — R531 Weakness: Secondary | ICD-10-CM | POA: Diagnosis present

## 2012-05-30 DIAGNOSIS — Z79899 Other long term (current) drug therapy: Secondary | ICD-10-CM | POA: Insufficient documentation

## 2012-05-30 DIAGNOSIS — R269 Unspecified abnormalities of gait and mobility: Secondary | ICD-10-CM | POA: Diagnosis present

## 2012-05-30 DIAGNOSIS — R109 Unspecified abdominal pain: Secondary | ICD-10-CM | POA: Insufficient documentation

## 2012-05-30 DIAGNOSIS — R110 Nausea: Secondary | ICD-10-CM | POA: Insufficient documentation

## 2012-05-30 DIAGNOSIS — IMO0002 Reserved for concepts with insufficient information to code with codable children: Secondary | ICD-10-CM

## 2012-05-30 DIAGNOSIS — R197 Diarrhea, unspecified: Principal | ICD-10-CM | POA: Insufficient documentation

## 2012-05-30 DIAGNOSIS — G589 Mononeuropathy, unspecified: Secondary | ICD-10-CM

## 2012-05-30 DIAGNOSIS — G610 Guillain-Barre syndrome: Secondary | ICD-10-CM

## 2012-05-30 DIAGNOSIS — R5381 Other malaise: Secondary | ICD-10-CM | POA: Insufficient documentation

## 2012-05-30 DIAGNOSIS — I10 Essential (primary) hypertension: Secondary | ICD-10-CM

## 2012-05-30 DIAGNOSIS — R011 Cardiac murmur, unspecified: Secondary | ICD-10-CM

## 2012-05-30 DIAGNOSIS — R51 Headache: Secondary | ICD-10-CM | POA: Insufficient documentation

## 2012-05-30 DIAGNOSIS — E785 Hyperlipidemia, unspecified: Secondary | ICD-10-CM

## 2012-05-30 DIAGNOSIS — E119 Type 2 diabetes mellitus without complications: Secondary | ICD-10-CM | POA: Insufficient documentation

## 2012-05-30 DIAGNOSIS — E860 Dehydration: Secondary | ICD-10-CM | POA: Insufficient documentation

## 2012-05-30 DIAGNOSIS — Z780 Asymptomatic menopausal state: Secondary | ICD-10-CM

## 2012-05-30 DIAGNOSIS — E871 Hypo-osmolality and hyponatremia: Secondary | ICD-10-CM | POA: Diagnosis present

## 2012-05-30 DIAGNOSIS — R112 Nausea with vomiting, unspecified: Secondary | ICD-10-CM

## 2012-05-30 HISTORY — DX: Disorder of kidney and ureter, unspecified: N28.9

## 2012-05-30 MED ORDER — SODIUM CHLORIDE 0.9 % IV BOLUS (SEPSIS)
1000.0000 mL | Freq: Once | INTRAVENOUS | Status: AC
  Administered 2012-05-31: 1000 mL via INTRAVENOUS

## 2012-05-30 MED ORDER — ONDANSETRON HCL 4 MG/2ML IJ SOLN
4.0000 mg | Freq: Once | INTRAMUSCULAR | Status: AC
  Administered 2012-05-31: 4 mg via INTRAVENOUS
  Filled 2012-05-30: qty 2

## 2012-05-30 NOTE — ED Notes (Signed)
Pt c/o nausea, diarrhea x 3 days. Pt c/o HA and generalized weakness. PWD, A & O.

## 2012-05-31 DIAGNOSIS — E871 Hypo-osmolality and hyponatremia: Secondary | ICD-10-CM

## 2012-05-31 DIAGNOSIS — R5381 Other malaise: Secondary | ICD-10-CM

## 2012-05-31 DIAGNOSIS — R197 Diarrhea, unspecified: Secondary | ICD-10-CM | POA: Diagnosis present

## 2012-05-31 DIAGNOSIS — R531 Weakness: Secondary | ICD-10-CM | POA: Diagnosis present

## 2012-05-31 DIAGNOSIS — E860 Dehydration: Secondary | ICD-10-CM

## 2012-05-31 LAB — CBC
MCH: 30.6 pg (ref 26.0–34.0)
MCHC: 35.6 g/dL (ref 30.0–36.0)
MCV: 86 fL (ref 78.0–100.0)
Platelets: 214 10*3/uL (ref 150–400)
RDW: 13.7 % (ref 11.5–15.5)
WBC: 8.7 10*3/uL (ref 4.0–10.5)

## 2012-05-31 LAB — BASIC METABOLIC PANEL
BUN: 22 mg/dL (ref 6–23)
CO2: 21 mEq/L (ref 19–32)
CO2: 23 mEq/L (ref 19–32)
Calcium: 8.9 mg/dL (ref 8.4–10.5)
Chloride: 98 mEq/L (ref 96–112)
Creatinine, Ser: 0.99 mg/dL (ref 0.50–1.10)
Creatinine, Ser: 1.08 mg/dL (ref 0.50–1.10)

## 2012-05-31 LAB — CBC WITH DIFFERENTIAL/PLATELET
Basophils Absolute: 0 10*3/uL (ref 0.0–0.1)
Eosinophils Relative: 1 % (ref 0–5)
HCT: 38.3 % (ref 36.0–46.0)
Hemoglobin: 13.6 g/dL (ref 12.0–15.0)
Lymphocytes Relative: 20 % (ref 12–46)
MCHC: 35.5 g/dL (ref 30.0–36.0)
MCV: 85.5 fL (ref 78.0–100.0)
Monocytes Absolute: 0.5 10*3/uL (ref 0.1–1.0)
Monocytes Relative: 5 % (ref 3–12)
RDW: 13.4 % (ref 11.5–15.5)
WBC: 10.4 10*3/uL (ref 4.0–10.5)

## 2012-05-31 LAB — GLUCOSE, CAPILLARY
Glucose-Capillary: 163 mg/dL — ABNORMAL HIGH (ref 70–99)
Glucose-Capillary: 188 mg/dL — ABNORMAL HIGH (ref 70–99)

## 2012-05-31 MED ORDER — TRAMADOL HCL 50 MG PO TABS
100.0000 mg | ORAL_TABLET | Freq: Two times a day (BID) | ORAL | Status: DC | PRN
  Administered 2012-05-31 – 2012-06-01 (×2): 100 mg via ORAL
  Filled 2012-05-31 (×2): qty 2

## 2012-05-31 MED ORDER — SODIUM CHLORIDE 0.9 % IV SOLN
INTRAVENOUS | Status: DC
  Administered 2012-05-31 (×2): via INTRAVENOUS

## 2012-05-31 MED ORDER — ACETAMINOPHEN 650 MG RE SUPP: 650.0000 mg | Freq: Four times a day (QID) | RECTAL | Status: DC | PRN

## 2012-05-31 MED ORDER — ONDANSETRON HCL 4 MG PO TABS: 4.0000 mg | ORAL_TABLET | Freq: Four times a day (QID) | ORAL | Status: DC | PRN

## 2012-05-31 MED ORDER — VITAMIN D3 25 MCG (1000 UNIT) PO TABS
5000.0000 [IU] | ORAL_TABLET | Freq: Every morning | ORAL | Status: DC
  Administered 2012-05-31 – 2012-06-01 (×2): 5000 [IU] via ORAL
  Filled 2012-05-31 (×2): qty 5

## 2012-05-31 MED ORDER — ACETAMINOPHEN 325 MG PO TABS: 650.0000 mg | ORAL_TABLET | Freq: Four times a day (QID) | ORAL | Status: DC | PRN

## 2012-05-31 MED ORDER — INSULIN ASPART 100 UNIT/ML ~~LOC~~ SOLN
0.0000 [IU] | SUBCUTANEOUS | Status: DC
  Administered 2012-05-31 (×2): 2 [IU] via SUBCUTANEOUS
  Administered 2012-05-31: 3 [IU] via SUBCUTANEOUS
  Administered 2012-05-31 (×2): 2 [IU] via SUBCUTANEOUS
  Administered 2012-06-01: 3 [IU] via SUBCUTANEOUS
  Administered 2012-06-01: 1 [IU] via SUBCUTANEOUS
  Administered 2012-06-01: 2 [IU] via SUBCUTANEOUS
  Administered 2012-06-01: 1 [IU] via SUBCUTANEOUS

## 2012-05-31 MED ORDER — HYDROMORPHONE HCL PF 1 MG/ML IJ SOLN
0.5000 mg | INTRAMUSCULAR | Status: DC | PRN
  Administered 2012-05-31: 1 mg via INTRAVENOUS
  Filled 2012-05-31: qty 1

## 2012-05-31 MED ORDER — CALCIUM CARBONATE 600 MG PO TABS: 600.0000 mg | ORAL_TABLET | Freq: Every morning | ORAL | Status: DC

## 2012-05-31 MED ORDER — EZETIMIBE 10 MG PO TABS
10.0000 mg | ORAL_TABLET | Freq: Every day | ORAL | Status: DC
  Administered 2012-05-31: 10 mg via ORAL
  Filled 2012-05-31 (×2): qty 1

## 2012-05-31 MED ORDER — CALCIUM CARBONATE-VITAMIN D 500-200 MG-UNIT PO TABS
1.0000 | ORAL_TABLET | Freq: Every day | ORAL | Status: DC
  Administered 2012-05-31 – 2012-06-01 (×2): 1 via ORAL
  Filled 2012-05-31 (×2): qty 1

## 2012-05-31 MED ORDER — METFORMIN HCL 500 MG PO TABS
1000.0000 mg | ORAL_TABLET | Freq: Two times a day (BID) | ORAL | Status: DC
  Filled 2012-05-31 (×3): qty 2

## 2012-05-31 MED ORDER — ENOXAPARIN SODIUM 40 MG/0.4ML ~~LOC~~ SOLN
40.0000 mg | SUBCUTANEOUS | Status: DC
  Administered 2012-05-31 – 2012-06-01 (×2): 40 mg via SUBCUTANEOUS
  Filled 2012-05-31 (×2): qty 0.4

## 2012-05-31 MED ORDER — ONDANSETRON HCL 4 MG/2ML IJ SOLN
4.0000 mg | Freq: Four times a day (QID) | INTRAMUSCULAR | Status: DC | PRN
  Administered 2012-05-31: 4 mg via INTRAVENOUS
  Filled 2012-05-31: qty 2

## 2012-05-31 MED ORDER — AMITRIPTYLINE HCL 10 MG PO TABS
30.0000 mg | ORAL_TABLET | Freq: Every day | ORAL | Status: DC
  Administered 2012-05-31: 30 mg via ORAL
  Filled 2012-05-31 (×2): qty 3

## 2012-05-31 MED ORDER — DIPHENOXYLATE-ATROPINE 2.5-0.025 MG PO TABS
1.0000 | ORAL_TABLET | Freq: Four times a day (QID) | ORAL | Status: DC | PRN
  Administered 2012-06-01: 2 via ORAL
  Administered 2012-06-01: 1 via ORAL
  Filled 2012-05-31: qty 2
  Filled 2012-05-31: qty 1

## 2012-05-31 MED ORDER — METFORMIN HCL 500 MG PO TABS
1000.0000 mg | ORAL_TABLET | Freq: Two times a day (BID) | ORAL | Status: DC
  Filled 2012-05-31 (×2): qty 2

## 2012-05-31 MED ORDER — GABAPENTIN 300 MG PO CAPS
300.0000 mg | ORAL_CAPSULE | Freq: Four times a day (QID) | ORAL | Status: DC
  Administered 2012-05-31 – 2012-06-01 (×6): 300 mg via ORAL
  Filled 2012-05-31 (×8): qty 1

## 2012-05-31 MED ORDER — LISINOPRIL 10 MG PO TABS
10.0000 mg | ORAL_TABLET | Freq: Every evening | ORAL | Status: DC
  Filled 2012-05-31 (×2): qty 1

## 2012-05-31 NOTE — ED Provider Notes (Signed)
History     CSN: [id number]  Arrival date & time 05/30/12  2301   First MD Initiated Contact with Patient 05/30/12 2319      Chief Complaint  Patient presents with  . Diarrhea    (Consider location/radiation/quality/duration/timing/severity/associated sxs/prior treatment) HPI... multiple episodes of diarrhea for the past 2 days described as green and malodorous.  Patient states 15-20 episodes per day.  status post diagnosis Guillain-Barre in December 2011 resulting in long hospitalization. Patient is able to walk a very short distance without help.  She feels weak and dehydrated.  Nothing makes symptoms better or worse. Severity is moderate.  Past Medical History  Diagnosis Date  . Diabetes mellitus     2  . Hyperlipidemia   . Hypertension   . Renal mass     BEING WORKED UP BY UROLOGY  . Chronic inflammatory demyelinating polyneuritis   . Guillain-Barre syndrome   . Renal disorder     L kidney removed    Past Surgical History  Procedure Laterality Date  . Cesarean section    . Cholecystectomy    . Nephrectomy      Family History  Problem Relation Age of Onset  . Cancer Father     PROSTATE  . Cancer Sister     BREAST  . Healthy Mother     History  Substance Use Topics  . Smoking status: Former Research scientist (life sciences)  . Smokeless tobacco: Never Used  . Alcohol Use: No    OB History   Grav Para Term Preterm Abortions TAB SAB Ect Mult Living                  Review of Systems  All other systems reviewed and are negative.    Allergies  Biaxin; Codeine; and Hydrocodone-acetaminophen  Home Medications   Current Outpatient Rx  Name  Route  Sig  Dispense  Refill  . amitriptyline (ELAVIL) 10 MG tablet   Oral   Take 30 mg by mouth at bedtime.         . calcium carbonate (OS-CAL) 600 MG TABS   Oral   Take 600 mg by mouth every morning.          . cholecalciferol (VITAMIN D) 1000 UNITS tablet   Oral   Take 5,000 Units by mouth every morning.          .  ezetimibe (ZETIA) 10 MG tablet   Oral   Take 10 mg by mouth at bedtime.         . gabapentin (NEURONTIN) 300 MG capsule   Oral   Take 300 mg by mouth 4 (four) times daily.          Marland Kitchen lisinopril (PRINIVIL,ZESTRIL) 10 MG tablet   Oral   Take 10 mg by mouth every evening.          . metFORMIN (GLUCOPHAGE) 1000 MG tablet   Oral   Take 1,000 mg by mouth 2 (two) times daily with a meal.         . promethazine (PHENERGAN) 25 MG tablet   Oral   Take 25 mg by mouth every 6 (six) hours as needed for nausea.         . traMADol (ULTRAM) 50 MG tablet   Oral   Take 100 mg by mouth 2 (two) times daily as needed for pain.            BP 155/62  Pulse 81  Temp(Src) 98.9 F (37.2 C) (Oral)  Resp 20  Wt 200 lb (90.719 kg)  BMI 37.81 kg/m2  SpO2 99%  Physical Exam  Nursing note and vitals reviewed. Constitutional: She is oriented to person, place, and time.  Pale, dehydrated  HENT:  Head: Normocephalic and atraumatic.  Eyes: Conjunctivae and EOM are normal. Pupils are equal, round, and reactive to light.  Neck: Normal range of motion. Neck supple.  Cardiovascular: Normal rate, regular rhythm and normal heart sounds.   Pulmonary/Chest: Effort normal and breath sounds normal.  Abdominal: Soft. Bowel sounds are normal.  Musculoskeletal: Normal range of motion.  Neurological: She is alert and oriented to person, place, and time.  Skin: Skin is warm and dry.  Psychiatric: She has a normal mood and affect.    ED Course  Procedures (including critical care time)  Labs Reviewed  BASIC METABOLIC PANEL - Abnormal; Notable for the following:    Sodium 133 (*)    Glucose, Bld 293 (*)    GFR calc non Af Amer 53 (*)    GFR calc Af Amer 62 (*)    All other components within normal limits  CLOSTRIDIUM DIFFICILE BY PCR  CBC WITH DIFFERENTIAL   No results found.   1. Diarrhea       MDM  Admit for IV hydration. Will need stool sample for C. Difficile.  Glucose moderately  elevated.        Nat Christen, MD 05/31/12 [phone]

## 2012-05-31 NOTE — Progress Notes (Signed)
[facility] progress note I have seen and examined patient admitted with diarrhea and weakness by Dr. Arnoldo Morale this a.m. She states the diarrhea is still present the slowing down this a.m. she admits to nausea but denies vomiting. Her C. difficile is back- negative this a.m. and I have started her on an antidiarrheal medication. She also resumed her outpatient medications for chronic medical conditions which remained stable at this time. Will otherwise continue current management plan as per Dr. Arnoldo Morale and follow.   Minette Head[facility] [id number]

## 2012-05-31 NOTE — H&P (Addendum)
[facility] History and Physical  Nicole Strong [id number] DOB: 02-27-1950 DOA: 05/30/2012  Referring physician: EDP PCP: Garnet Koyanagi, DO  Specialists:   Chief Complaint: Diarrhea and Weakness  HPI: Nicole Strong is a 62 y.o. 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  She reports having nausea and weakness sine last night.   She denies having fever or chills, or vomiting.   She has had crampy lower ABD pain.       Review of Systems: The patient denies anorexia, fever, chills, headaches, weight loss, vision loss, decreased hearing, hoarseness, chest pain, syncope, dyspnea on exertion, peripheral edema, balance deficits, hemoptysis, abdominal pain, vomiting, hematemesis, melena, hematochezia, severe indigestion/heartburn, hematuria, incontinence, suspicious skin lesions, transient blindness, difficulty walking, depression, unusual weight change, abnormal bleeding, enlarged lymph nodes, angioedema, and breast masses.    Past Medical History  Diagnosis Date  . Diabetes mellitus     2  . Hyperlipidemia   . Hypertension   . Renal mass     BEING WORKED UP BY UROLOGY  . Chronic inflammatory demyelinating polyneuritis   . Guillain-Barre syndrome   . Renal disorder     L kidney removed     Past Surgical History  Procedure Laterality Date  . Cesarean section    . Cholecystectomy    . Nephrectomy       Medications:  HOME MEDS: Prior to Admission medications   Medication Sig Start Date End Date Taking? Authorizing Provider  amitriptyline (ELAVIL) 10 MG tablet Take 30 mg by mouth at bedtime.   Yes Historical Provider, MD  calcium carbonate (OS-CAL) 600 MG TABS Take 600 mg by mouth every morning.    Yes Historical Provider, MD  cholecalciferol (VITAMIN D) 1000 UNITS tablet Take 5,000 Units by mouth every morning.    Yes Historical Provider, MD  ezetimibe  (ZETIA) 10 MG tablet Take 10 mg by mouth at bedtime.   Yes Historical Provider, MD  gabapentin (NEURONTIN) 300 MG capsule Take 300 mg by mouth 4 (four) times daily.  05/19/12  Yes Historical Provider, MD  lisinopril (PRINIVIL,ZESTRIL) 10 MG tablet Take 10 mg by mouth every evening.    Yes Historical Provider, MD  metFORMIN (GLUCOPHAGE) 1000 MG tablet Take 1,000 mg by mouth 2 (two) times daily with a meal.   Yes Historical Provider, MD  promethazine (PHENERGAN) 25 MG tablet Take 25 mg by mouth every 6 (six) hours as needed for nausea. 05/19/12  Yes Dennie Bible, NP  traMADol (ULTRAM) 50 MG tablet Take 100 mg by mouth 2 (two) times daily as needed for pain.    Yes Historical Provider, MD     Allergies:  Allergies  Allergen Reactions  . Biaxin (Clarithromycin) Other (See Comments)    "face turns red"  . Codeine Nausea And Vomiting    Hallucinations  . Hydrocodone-Acetaminophen Other (See Comments)    "head feels funny"    Social History:   reports that she has quit smoking 7 years ago.  She has never used smokeless tobacco. She reports that she does not drink alcohol or use illicit drugs.   Family History: Family History  Problem Relation Age of Onset  . Cancer Father     PROSTATE  . Cancer Sister     BREAST  . Healthy Mother      Physical Exam:  GEN:  Pleasant  Morbidly Obese 62 year old Caucasian Female examined  and in no acute distress; cooperative with exam Filed Vitals:   05/30/12 2306 05/31/12 0144 05/31/12 0315  BP: 135/77 155/62 168/68  Pulse: 93 81 84  Temp: 98.9 F (37.2 C)  99.1 F (37.3 C)  TempSrc: Oral  Oral  Resp: 20  20  Height:   5\' 1"  (1.549 m)  Weight: 90.719 kg (200 lb)  95.437 kg (210 lb 6.4 oz)  SpO2: 100% 99% 99%   Blood pressure 168/68, pulse 84, temperature 99.1 F (37.3 C), temperature source Oral, resp. rate 20, height 5\' 1"  (1.549 m), weight 95.437 kg (210 lb 6.4 oz), SpO2 99.00%. PSYCH: She is alert and oriented x4; does not appear  anxious does not appear depressed; affect is normal HEENT: Normocephalic and Atraumatic, Mucous membranes pink; PERRLA; EOM intact; Fundi:  Benign;  No scleral icterus, Nares: Patent, Oropharynx: Clear, Fair Dentition, Neck:  FROM, no cervical lymphadenopathy nor thyromegaly or carotid bruit; no JVD; Breasts:: Not examined CHEST WALL: No tenderness CHEST: Normal respiration, clear to auscultation bilaterally HEART: Regular rate and rhythm; no murmurs rubs or gallops BACK: No kyphosis or scoliosis; no CVA tenderness ABDOMEN: Positive Bowel Sounds, Obese, soft non-tender; no masses, no organomegaly, no pannus; no intertriginous candida. Rectal Exam: Not done EXTREMITIES: No cyanosis, clubbing or edema; no ulcerations. Genitalia: not examined PULSES: 2+ and symmetric SKIN: Normal hydration no rash or ulceration CNS: Cranial nerves 2-12 grossly intact no focal neurologic deficit   Labs & Imaging Results for orders placed during the hospital encounter of 05/30/12 (from the past 48 hour(s))  BASIC METABOLIC PANEL     Status: Abnormal   Collection Time    05/31/12 12:20 AM      Result Value Range   Sodium 133 (*) 135 - 145 mEq/L   Potassium 4.3  3.5 - 5.1 mEq/L   Chloride 98  96 - 112 mEq/L   CO2 21  19 - 32 mEq/L   Glucose, Bld 293 (*) 70 - 99 mg/dL   BUN 22  6 - 23 mg/dL   Creatinine, Ser 1.08  0.50 - 1.10 mg/dL   Calcium 9.9  8.4 - 10.5 mg/dL   GFR calc non Af Amer 53 (*) >90 mL/min   GFR calc Af Amer 62 (*) >90 mL/min   Comment:            The eGFR has been calculated     using the CKD EPI equation.     This calculation has not been     validated in all clinical     situations.     eGFR's persistently     <90 mL/min signify     possible Chronic Kidney Disease.  CBC WITH DIFFERENTIAL     Status: None   Collection Time    05/31/12 12:20 AM      Result Value Range   WBC 10.4  4.0 - 10.5 K/uL   RBC 4.48  3.87 - 5.11 MIL/uL   Hemoglobin 13.6  12.0 - 15.0 g/dL   HCT 38.3  36.0 -  46.0 %   MCV 85.5  78.0 - 100.0 fL   MCH 30.4  26.0 - 34.0 pg   MCHC 35.5  30.0 - 36.0 g/dL   RDW 13.4  11.5 - 15.5 %   Platelets 241  150 - 400 K/uL   Neutrophils Relative 74  43 - 77 %   Neutro Abs 7.7  1.7 - 7.7  K/uL   Lymphocytes Relative 20  12 - 46 %   Lymphs Abs 2.1  0.7 - 4.0 K/uL   Monocytes Relative 5  3 - 12 %   Monocytes Absolute 0.5  0.1 - 1.0 K/uL   Eosinophils Relative 1  0 - 5 %   Eosinophils Absolute 0.1  0.0 - 0.7 K/uL   Basophils Relative 0  0 - 1 %   Basophils Absolute 0.0  0.0 - 0.1 K/uL     Assessment/Plan Principal Problem:   Diarrhea Active Problems:   Dehydration   Weakness generalized   Hyponatremia   DIABETES MELLITUS, TYPE II   HYPERTENSION   Gait disorder   1.   Diarrhea - evaluate for possible C.diff, however may be late Norovirus.   Placed in Enteric Isolation.   IVFs for rehydration, and Monitor Electrolytes.     2.   Dehydration-  IVFs for rehydration.    3.   Weakness-  Multifactorial, due to late effects of GBS, an exacerbated by illness.    4.   DM2-  SSI coverage PRN.    5.  HTN- Monitor Blood pressures.    6.  Hyponatremia-  IVFs with NSS, monitor Na+ levels.     7.  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          Code Status:  FULL CODE    Family Communication:    No Family Present  Disposition Plan:    Return to Home  Time spent: [facility] Pager [phone]   If 7PM-7AM, please contact night-coverage [email or web address] Password [facility] 05/31/2012, 3:45 AM

## 2012-06-01 DIAGNOSIS — R112 Nausea with vomiting, unspecified: Secondary | ICD-10-CM

## 2012-06-01 DIAGNOSIS — I10 Essential (primary) hypertension: Secondary | ICD-10-CM

## 2012-06-01 DIAGNOSIS — E119 Type 2 diabetes mellitus without complications: Secondary | ICD-10-CM

## 2012-06-01 DIAGNOSIS — R197 Diarrhea, unspecified: Principal | ICD-10-CM

## 2012-06-01 LAB — GLUCOSE, CAPILLARY
Glucose-Capillary: 137 mg/dL — ABNORMAL HIGH (ref 70–99)
Glucose-Capillary: 138 mg/dL — ABNORMAL HIGH (ref 70–99)
Glucose-Capillary: 230 mg/dL — ABNORMAL HIGH (ref 70–99)

## 2012-06-01 LAB — BASIC METABOLIC PANEL
BUN: 11 mg/dL (ref 6–23)
Chloride: 105 mEq/L (ref 96–112)
GFR calc Af Amer: 72 mL/min — ABNORMAL LOW (ref >90)
Glucose, Bld: 142 mg/dL — ABNORMAL HIGH (ref 70–99)
Potassium: 3.7 mEq/L (ref 3.5–5.1)

## 2012-06-01 MED ORDER — PROMETHAZINE HCL 25 MG PO TABS: 25.0000 mg | ORAL_TABLET | Freq: Four times a day (QID) | ORAL | Status: DC | PRN

## 2012-06-01 MED ORDER — DIPHENOXYLATE-ATROPINE 2.5-0.025 MG PO TABS: 1.0000 | ORAL_TABLET | Freq: Four times a day (QID) | ORAL | Status: DC | PRN

## 2012-06-01 NOTE — Progress Notes (Signed)
DC instructions gone over with patient, verbalized understanding.  Prescriptions for Phenergan and Lomotil given.  Transported to front of hospital to be taken home by friend.

## 2012-06-01 NOTE — Discharge Summary (Signed)
Physician Discharge Summary  Nicole Strong [id number] DOB: 1950-05-09 DOA: 05/30/2012  PCP: Nicole Koyanagi, DO  Admit date: 05/30/2012 Discharge date: 06/01/2012  Time spent: <30 minutes  Recommendations for Outpatient Follow-up:      Follow-up Information   Follow up with Nicole Koyanagi, DO. (PCP in 1-2weeks, call for appt)    Contact information:   [address] [place] [address] [phone]       Discharge Diagnoses:  Principal Problem:   Diarrhea Active Problems:   DIABETES MELLITUS, TYPE II   HYPERTENSION   Gait disorder   Dehydration   Weakness generalized   Hyponatremia   Discharge Condition: Improved/stable  Diet recommendation: Modified carbohydrate  Filed Weights   05/30/12 2306 05/31/12 0315  Weight: 90.719 kg (200 lb) 95.437 kg (210 lb 6.4 oz)    History of present illness:  Nicole Strong is a 62 y.o. 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 She reports having nausea and weakness sine last night. She denies having fever or chills, or vomiting. She has had crampy lower ABD pain.    Hospital Course:  1. Diarrhea, likely viral gastroenteritis -As discussed above, upon admission stool studies were obtained, and patient was managed supportively with IV fluids, anti-emetic agents. The C. difficile came back negative. Patient was started on antidiarrheal agents. The stool culture still pending at this time. Her diarrhea is clinically improved. She has remained afebrile and hemodynamically stable. She'll be discharged for outpatient followup at this time. 2. Dehydration- resolved with IVFs.  3. Weakness- Multifactorial, due to late effects of GBS, an exacerbated by illness. She is to follow up outpatient with Nicole Strong for her GBS. 4. DM2- she was covered with sliding scale insulin while in the hospital,  she is to continue her metformin upon discharge..  5. HTN-she is to continue her outpatient medications upon discharge.  6. Hyponatremia- resolved with IV fluids. 7. 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      Procedures:  none  Consultations:  none  Discharge Exam: Filed Vitals:   05/31/12 2015 06/01/12 0249 06/01/12 0500 06/01/12 1010  BP: 154/77 154/74 135/69 122/62  Pulse: 70 70 71 65  Temp: 98.2 F (36.8 C) 98.3 F (36.8 C) 97.8 F (36.6 C) 98.6 F (37 C)  TempSrc: Oral Oral Oral Oral  Resp: 20 20 20 20   Height:      Weight:      SpO2: 96% 96% 95% 97%    General: Alert and oriented x3, in no apparent distress Cardiovascular: Regular rate and rhythm, normal S1-S2 Respiratory: Clear to auscultation bilaterally no crackles or wheezes Abdomen: Soft, bowel sounds present nontender nondistended no organomegaly and no masses palpable Extremities: No cyanosis and no edema  Discharge Instructions  Discharge Orders   Future Appointments Provider Department Dept Phone   11/19/2012 11:30 AM [name], MD [facility] [phone]   Future Orders Complete By Expires     Diet Carb Modified  As directed     Increase activity slowly  As directed         Medication List    TAKE these medications       amitriptyline 10 MG tablet  Commonly known as:  ELAVIL  Take 30 mg by mouth at bedtime.     calcium carbonate 600 MG  Tabs  Commonly known as:  OS-CAL  Take 600 mg by mouth every morning.     cholecalciferol 1000 UNITS tablet  Commonly known as:  VITAMIN D  Take 5,000 Units by mouth every morning.     diphenoxylate-atropine 2.5-0.025 MG per tablet  Commonly known as:  LOMOTIL  Take 1-2 tablets by mouth 4 (four) times daily as needed for diarrhea or loose stools.     ezetimibe 10 MG tablet  Commonly known as:  ZETIA  Take 10 mg by mouth at bedtime.     gabapentin 300 MG capsule  Commonly known as:  NEURONTIN  Take 300 mg by mouth 4 (four)  times daily.     lisinopril 10 MG tablet  Commonly known as:  PRINIVIL,ZESTRIL  Take 10 mg by mouth every evening.     metFORMIN 1000 MG tablet  Commonly known as:  GLUCOPHAGE  Take 1,000 mg by mouth 2 (two) times daily with a meal.     promethazine 25 MG tablet  Commonly known as:  PHENERGAN  Take 1 tablet (25 mg total) by mouth every 6 (six) hours as needed for nausea.     traMADol 50 MG tablet  Commonly known as:  ULTRAM  Take 100 mg by mouth 2 (two) times daily as needed for pain.           Follow-up Information   Follow up with Nicole Koyanagi, DO. (PCP in 1-2weeks, call for appt)    Contact information:   [address] [place] [address] [phone]        The results of significant diagnostics from this hospitalization (including imaging, microbiology, ancillary and laboratory) are listed below for reference.    Significant Diagnostic Studies: No results found.  Microbiology: Recent Results (from the past 240 hour(s))  CLOSTRIDIUM DIFFICILE BY PCR     Status: None   Collection Time    05/31/12  1:42 AM      Result Value Range Status   C difficile by pcr NEGATIVE  NEGATIVE Final     Labs: Basic Metabolic Panel:  Recent Labs Lab 05/31/12 0020 05/31/12 0529 06/01/12 0511  NA 133* 135 137  K 4.3 3.5 3.7  CL 98 103 105  CO2 21 23 26   GLUCOSE 293* 181* 142*  BUN 22 19 11   CREATININE 1.08 0.99 0.95  CALCIUM 9.9 8.9 8.7   Liver Function Tests: No results found for this basename: AST, ALT, ALKPHOS, BILITOT, PROT, ALBUMIN,  in the last 168 hours No results found for this basename: LIPASE, AMYLASE,  in the last 168 hours No results found for this basename: AMMONIA,  in the last 168 hours CBC:  Recent Labs Lab 05/31/12 0020 05/31/12 0529  WBC 10.4 8.7  NEUTROABS 7.7  --   HGB 13.6 12.0  HCT 38.3 33.7*  MCV 85.5 86.0  PLT 241 214   Cardiac Enzymes: No results found for this basename: CKTOTAL, CKMB,  CKMBINDEX, TROPONINI,  in the last 168 hours BNP: BNP (last 3 results) No results found for this basename: PROBNP,  in the last 8760 hours CBG:  Recent Labs Lab 05/31/12 2013 05/31/12 2355 06/01/12 0458 06/01/12 0729 06/01/12 1142  GLUCAP 189* 188* 137* 138* 167*       Signed:  Viliami Strong C  [facility] 06/01/2012, 2:27 PM

## 2012-06-03 LAB — STOOL CULTURE

## 2012-06-04 ENCOUNTER — Telehealth: Payer: Self-pay | Admitting: Nurse Practitioner

## 2012-06-04 NOTE — Telephone Encounter (Signed)
I received a call from Dr. Regenia Skeeter, pager [phone] about plasma phoresis for this patient.The referral was written on 05/19/12.  She says that the orders must be written by Dr. Brett Fairy that the   dialysis MDs do not write these orders. I then discussed with Dr. Krista Blue in our clinic who was the work in MD and she stated this could wait until Dr. Beacher May is back in the office next week.  I then called the patient to let her know of the delay and that Dr. Brett Fairy or assistant would be contacting her sometime next week.The patient was fine with this. NCMartin, GNP

## 2012-06-11 ENCOUNTER — Other Ambulatory Visit: Payer: Self-pay | Admitting: Neurology

## 2012-06-12 ENCOUNTER — Other Ambulatory Visit: Payer: Self-pay | Admitting: Family Medicine

## 2012-06-12 NOTE — Telephone Encounter (Signed)
See below

## 2012-06-13 ENCOUNTER — Other Ambulatory Visit: Payer: Self-pay | Admitting: Neurology

## 2012-06-13 DIAGNOSIS — G6181 Chronic inflammatory demyelinating polyneuritis: Secondary | ICD-10-CM

## 2012-06-13 NOTE — Telephone Encounter (Signed)
Rf request. Patient has not been seen since 01/18/11 No pending apt's. I called the patient and scheduled an apt for 5/12 at 11.    KP

## 2012-06-13 NOTE — Telephone Encounter (Signed)
Please call patient with plan for plasma echange.

## 2012-06-16 ENCOUNTER — Inpatient Hospital Stay (HOSPITAL_COMMUNITY): Admission: RE | Admit: 2012-06-16 | Payer: BC Managed Care – PPO | Source: Ambulatory Visit

## 2012-06-16 ENCOUNTER — Other Ambulatory Visit: Payer: Self-pay | Admitting: Radiology

## 2012-06-16 ENCOUNTER — Telehealth: Payer: Self-pay | Admitting: Neurology

## 2012-06-16 NOTE — Telephone Encounter (Signed)
Please call the patient about plasma exchange, thank you

## 2012-06-16 NOTE — Telephone Encounter (Signed)
I spoke with patient and gave her all appointments for her plasma exchange.  Interventional radiology on 06-18-12 for catheter placement and 06-23-12 for plasma exchange in hemodialysis unit at [facility].   She understood and IR was going to call patient with instructions prior to catheter placement.

## 2012-06-16 NOTE — Telephone Encounter (Signed)
Patient has been called with appointments.

## 2012-06-17 ENCOUNTER — Encounter (HOSPITAL_COMMUNITY): Payer: Self-pay | Admitting: Pharmacy Technician

## 2012-06-18 ENCOUNTER — Other Ambulatory Visit: Payer: Self-pay | Admitting: Neurology

## 2012-06-18 ENCOUNTER — Encounter (HOSPITAL_COMMUNITY): Payer: Self-pay

## 2012-06-18 ENCOUNTER — Ambulatory Visit (HOSPITAL_COMMUNITY)
Admission: RE | Admit: 2012-06-18 | Discharge: 2012-06-18 | Disposition: A | Discharge status: Home / Self Care | Payer: Medicare Other | Source: Ambulatory Visit | Attending: Neurology | Admitting: Neurology

## 2012-06-18 DIAGNOSIS — Z905 Acquired absence of kidney: Secondary | ICD-10-CM | POA: Insufficient documentation

## 2012-06-18 DIAGNOSIS — E119 Type 2 diabetes mellitus without complications: Secondary | ICD-10-CM | POA: Diagnosis not present

## 2012-06-18 DIAGNOSIS — I10 Essential (primary) hypertension: Secondary | ICD-10-CM | POA: Insufficient documentation

## 2012-06-18 DIAGNOSIS — G6181 Chronic inflammatory demyelinating polyneuritis: Secondary | ICD-10-CM | POA: Insufficient documentation

## 2012-06-18 DIAGNOSIS — Z85528 Personal history of other malignant neoplasm of kidney: Secondary | ICD-10-CM | POA: Insufficient documentation

## 2012-06-18 DIAGNOSIS — Z885 Allergy status to narcotic agent status: Secondary | ICD-10-CM | POA: Insufficient documentation

## 2012-06-18 DIAGNOSIS — E785 Hyperlipidemia, unspecified: Secondary | ICD-10-CM | POA: Diagnosis not present

## 2012-06-18 DIAGNOSIS — Z881 Allergy status to other antibiotic agents status: Secondary | ICD-10-CM | POA: Insufficient documentation

## 2012-06-18 DIAGNOSIS — G610 Guillain-Barre syndrome: Secondary | ICD-10-CM | POA: Diagnosis not present

## 2012-06-18 DIAGNOSIS — Z87891 Personal history of nicotine dependence: Secondary | ICD-10-CM | POA: Diagnosis not present

## 2012-06-18 LAB — CBC WITH DIFFERENTIAL/PLATELET
Basophils Absolute: 0 10*3/uL (ref 0.0–0.1)
Eosinophils Relative: 2 % (ref 0–5)
Lymphocytes Relative: 23 % (ref 12–46)
Lymphs Abs: 1.6 10*3/uL (ref 0.7–4.0)
MCV: 82.8 fL (ref 78.0–100.0)
Neutrophils Relative %: 70 % (ref 43–77)
Platelets: 227 10*3/uL (ref 150–400)
RBC: 4.43 MIL/uL (ref 3.87–5.11)
RDW: 13.6 % (ref 11.5–15.5)
WBC: 7 10*3/uL (ref 4.0–10.5)

## 2012-06-18 LAB — PROTIME-INR
INR: 0.96 (ref 0.00–1.49)
Prothrombin Time: 12.7 seconds (ref 11.6–15.2)

## 2012-06-18 LAB — GLUCOSE, CAPILLARY: Glucose-Capillary: 324 mg/dL — ABNORMAL HIGH (ref 70–99)

## 2012-06-18 MED ORDER — FENTANYL CITRATE 0.05 MG/ML IJ SOLN
INTRAMUSCULAR | Status: AC
  Filled 2012-06-18: qty 4

## 2012-06-18 MED ORDER — GELATIN ABSORBABLE 12-7 MM EX MISC
CUTANEOUS | Status: AC
  Filled 2012-06-18: qty 1

## 2012-06-18 MED ORDER — CEFAZOLIN SODIUM-DEXTROSE 2-3 GM-% IV SOLR
INTRAVENOUS | Status: AC
  Administered 2012-06-18: 2 g via INTRAVENOUS
  Filled 2012-06-18: qty 50

## 2012-06-18 MED ORDER — SODIUM CHLORIDE 0.9 % IV SOLN: INTRAVENOUS | Status: DC

## 2012-06-18 MED ORDER — MIDAZOLAM HCL 2 MG/2ML IJ SOLN
INTRAMUSCULAR | Status: AC
  Filled 2012-06-18: qty 4

## 2012-06-18 MED ORDER — MIDAZOLAM HCL 2 MG/2ML IJ SOLN
INTRAMUSCULAR | Status: DC | PRN
  Administered 2012-06-18 (×2): 1 mg via INTRAVENOUS

## 2012-06-18 MED ORDER — FENTANYL CITRATE 0.05 MG/ML IJ SOLN
INTRAMUSCULAR | Status: DC | PRN
  Administered 2012-06-18: 50 ug via INTRAVENOUS

## 2012-06-18 MED ORDER — HEPARIN SODIUM (PORCINE) 1000 UNIT/ML IJ SOLN
INTRAMUSCULAR | Status: AC
  Filled 2012-06-18: qty 1

## 2012-06-18 MED ORDER — CEFAZOLIN SODIUM-DEXTROSE 2-3 GM-% IV SOLR: 2.0000 g | Freq: Once | INTRAVENOUS | Status: AC

## 2012-06-18 MED ORDER — INSULIN ASPART 100 UNIT/ML ~~LOC~~ SOLN
15.0000 [IU] | Freq: Once | SUBCUTANEOUS | Status: AC
  Administered 2012-06-18: 15 [IU] via SUBCUTANEOUS

## 2012-06-18 NOTE — Procedures (Signed)
SUCCESSSFUL LT IJ HD CATH FOR PLASMAPHERESIS TIP SVC/RA NO COMP STABLE

## 2012-06-18 NOTE — H&P (Signed)
Nicole Strong is an 62 y.o. female.   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 Diabetes mellitus     2  . Hyperlipidemia   . Hypertension   . Renal mass     BEING WORKED UP BY UROLOGY  . Chronic inflammatory demyelinating polyneuritis   . Guillain-Barre syndrome   . Renal disorder     L kidney removed    Past Surgical History  Procedure Laterality Date  . Cesarean section    . Cholecystectomy    . Nephrectomy      Family History  Problem Relation Age of Onset  . Cancer Father     PROSTATE  . Cancer Sister     BREAST  . Healthy Mother    Social History:  reports that she has quit smoking. She has never used smokeless tobacco. She reports that she does not drink alcohol or use illicit drugs.  Allergies:  Allergies  Allergen Reactions  . Biaxin (Clarithromycin) Other (See Comments)    "face turns red"  . Codeine Nausea And Vomiting    Hallucinations  . Erythromycin Nausea And Vomiting  . Hydrocodone-Acetaminophen Other (See Comments)    "head feels funny"     (Not in a hospital admission)  No results found for this or any previous visit (from the past 48 hour(s)). No results found.  Review of Systems  Constitutional: Negative for fever.  HENT: Negative for neck pain.   Respiratory: Negative for shortness of breath.   Cardiovascular: Negative for chest pain.  Gastrointestinal: Negative for abdominal pain.  Musculoskeletal: Negative for back pain and joint pain.  Neurological: Positive for dizziness and weakness. Negative for headaches.    Blood pressure 164/83, pulse 80, temperature 99 F (37.2 C), temperature source Oral, resp. rate 18, height 5\' 1"  (1.549 m), weight 200 lb  (90.719 kg), SpO2 95.00%. Physical Exam  Constitutional: She is oriented to person, place, and time. She appears well-developed and well-nourished.  Cardiovascular: Normal rate, regular rhythm and normal heart sounds.   No murmur heard. Respiratory: Effort normal and breath sounds normal. She has no wheezes.  GI: Soft. Bowel sounds are normal. There is no tenderness.  Musculoskeletal: Normal range of motion.  B leg weakness  Neurological: She is alert and oriented to person, place, and time. Coordination abnormal.  Psychiatric: She has a normal mood and affect. Her behavior is normal. Judgment and thought content normal.     Assessment/Plan Plasma pheresis catheter placement for GBS/CIDS treatment Pt aware of procedure benefits and risks and agreeable to proceed Consent signed and in chart  Hanahan A 06/18/2012, 9:21 AM

## 2012-06-23 ENCOUNTER — Ambulatory Visit: Payer: BC Managed Care – PPO | Admitting: Family Medicine

## 2012-06-23 ENCOUNTER — Telehealth: Payer: Self-pay | Admitting: Neurology

## 2012-06-23 ENCOUNTER — Non-Acute Institutional Stay (HOSPITAL_COMMUNITY)
Admission: AD | Admit: 2012-06-23 | Discharge: 2012-06-23 | Disposition: A | Discharge status: Home / Self Care | Payer: Medicare Other | Source: Ambulatory Visit | Attending: Neurology | Admitting: Neurology

## 2012-06-23 DIAGNOSIS — G6181 Chronic inflammatory demyelinating polyneuritis: Secondary | ICD-10-CM | POA: Diagnosis not present

## 2012-06-23 MED ORDER — SODIUM CHLORIDE 0.9 % IV SOLN
INTRAVENOUS | Status: AC
  Administered 2012-06-23 (×2): via INTRAVENOUS_CENTRAL
  Filled 2012-06-23 (×4): qty 200

## 2012-06-23 MED ORDER — ACD FORMULA A 0.73-2.45-2.2 GM/100ML VI SOLN
500.0000 mL | Status: DC
  Administered 2012-06-23: 500 mL via INTRAVENOUS

## 2012-06-23 MED ORDER — SODIUM CHLORIDE 0.9 % IV SOLN: Freq: Once | INTRAVENOUS | Status: DC

## 2012-06-23 MED ORDER — CALCIUM GLUCONATE 10 % IV SOLN: 2.0000 g | Freq: Once | INTRAVENOUS | Status: DC

## 2012-06-23 MED ORDER — SODIUM CHLORIDE 0.9 % IV SOLN
4.0000 g | Freq: Once | INTRAVENOUS | Status: AC
  Administered 2012-06-23: 4 g via INTRAVENOUS
  Filled 2012-06-23 (×2): qty 40

## 2012-06-23 MED ORDER — DIPHENHYDRAMINE HCL 25 MG PO CAPS: 25.0000 mg | ORAL_CAPSULE | Freq: Four times a day (QID) | ORAL | Status: DC | PRN

## 2012-06-23 MED ORDER — SODIUM CHLORIDE 0.9 % IV SOLN
INTRAVENOUS | Status: AC
  Administered 2012-06-23 (×3): via INTRAVENOUS_CENTRAL
  Filled 2012-06-23 (×3): qty 200

## 2012-06-23 MED ORDER — ACETAMINOPHEN 325 MG PO TABS: 650.0000 mg | ORAL_TABLET | ORAL | Status: DC | PRN

## 2012-06-23 MED ORDER — ANTICOAGULANT SODIUM CITRATE 4% (200MG/5ML) IV SOLN
5.0000 mL | Freq: Once | Status: AC
  Administered 2012-06-23: 5 mL
  Filled 2012-06-23: qty 250

## 2012-06-23 NOTE — Telephone Encounter (Signed)
Pt getting her PLE today.  Order states qod for 5 doses.  They are only open M-Sat, Sun for Emergent cases only.  Asking if pt can get doses next M and Wed.  Also catheter today sluggish, want order for TPA 4mg  total for dwell end of treatment today and then remove when in on Wednesday this week.  Please advise.

## 2012-06-23 NOTE — Progress Notes (Signed)
Pt tolerated 1st plasmapheresis tx well.  No s/s of reaction or hypocalcemia noted. Pt dc'd to home with son, they are both aware of her next tx scheduled for Wednesday.

## 2012-06-23 NOTE — Telephone Encounter (Signed)
Please call patient / plasmapherisis, its OK to use Mo wed Friday and  Again Mo Wednesday ...   if the double lumen cath is  blocked, it's permissible to use TPA .

## 2012-06-23 NOTE — Telephone Encounter (Signed)
I consulted Dr. Brett Fairy and she relayed that MWF this week and MW next week is ok.   The TPA dwell 4mg  after PLE treatment is ok to do on Wednesday.

## 2012-06-24 LAB — POCT I-STAT, CHEM 8
BUN: 20 mg/dL (ref 6–23)
Calcium, Ion: 1.27 mmol/L (ref 1.13–1.30)
Chloride: 97 mEq/L (ref 96–112)
Creatinine, Ser: 0.8 mg/dL (ref 0.50–1.10)
TCO2: 28 mmol/L (ref 0–100)

## 2012-06-25 ENCOUNTER — Non-Acute Institutional Stay (HOSPITAL_COMMUNITY)
Admission: AD | Admit: 2012-06-25 | Discharge: 2012-06-25 | Disposition: A | Discharge status: Home / Self Care | Payer: Medicare Other | Source: Ambulatory Visit | Attending: Neurology | Admitting: Neurology

## 2012-06-25 DIAGNOSIS — Z79899 Other long term (current) drug therapy: Secondary | ICD-10-CM | POA: Diagnosis not present

## 2012-06-25 DIAGNOSIS — Z885 Allergy status to narcotic agent status: Secondary | ICD-10-CM | POA: Insufficient documentation

## 2012-06-25 DIAGNOSIS — E785 Hyperlipidemia, unspecified: Secondary | ICD-10-CM | POA: Insufficient documentation

## 2012-06-25 DIAGNOSIS — Z87891 Personal history of nicotine dependence: Secondary | ICD-10-CM | POA: Insufficient documentation

## 2012-06-25 DIAGNOSIS — Z905 Acquired absence of kidney: Secondary | ICD-10-CM | POA: Diagnosis not present

## 2012-06-25 DIAGNOSIS — G6181 Chronic inflammatory demyelinating polyneuritis: Secondary | ICD-10-CM | POA: Insufficient documentation

## 2012-06-25 DIAGNOSIS — I10 Essential (primary) hypertension: Secondary | ICD-10-CM | POA: Diagnosis not present

## 2012-06-25 DIAGNOSIS — Z881 Allergy status to other antibiotic agents status: Secondary | ICD-10-CM | POA: Insufficient documentation

## 2012-06-25 DIAGNOSIS — G610 Guillain-Barre syndrome: Secondary | ICD-10-CM | POA: Diagnosis not present

## 2012-06-25 DIAGNOSIS — Z85528 Personal history of other malignant neoplasm of kidney: Secondary | ICD-10-CM | POA: Insufficient documentation

## 2012-06-25 DIAGNOSIS — E119 Type 2 diabetes mellitus without complications: Secondary | ICD-10-CM | POA: Insufficient documentation

## 2012-06-25 LAB — CBC
HCT: 33.9 % — ABNORMAL LOW (ref 36.0–46.0)
MCH: 30.1 pg (ref 26.0–34.0)
MCV: 85.8 fL (ref 78.0–100.0)
Platelets: 187 10*3/uL (ref 150–400)
RBC: 3.95 MIL/uL (ref 3.87–5.11)
RDW: 13.7 % (ref 11.5–15.5)
WBC: 7.7 10*3/uL (ref 4.0–10.5)

## 2012-06-25 LAB — COMPREHENSIVE METABOLIC PANEL
AST: 30 U/L (ref 0–37)
BUN: 12 mg/dL (ref 6–23)
CO2: 25 mEq/L (ref 19–32)
Calcium: 9.7 mg/dL (ref 8.4–10.5)
Chloride: 99 mEq/L (ref 96–112)
Creatinine, Ser: 0.86 mg/dL (ref 0.50–1.10)
GFR calc non Af Amer: 70 mL/min — ABNORMAL LOW (ref >90)
Total Bilirubin: 0.4 mg/dL (ref 0.3–1.2)

## 2012-06-25 MED ORDER — SODIUM CHLORIDE 0.9 % IV SOLN
INTRAVENOUS | Status: DC
  Administered 2012-06-25 (×4): via INTRAVENOUS_CENTRAL
  Filled 2012-06-25 (×6): qty 200

## 2012-06-25 MED ORDER — ACETAMINOPHEN 325 MG PO TABS: 650.0000 mg | ORAL_TABLET | ORAL | Status: DC | PRN

## 2012-06-25 MED ORDER — ACD FORMULA A 0.73-2.45-2.2 GM/100ML VI SOLN
500.0000 mL | Status: DC
  Administered 2012-06-25: 500 mL via INTRAVENOUS

## 2012-06-25 MED ORDER — HEPARIN SODIUM (PORCINE) 1000 UNIT/ML IJ SOLN: 1000.0000 [IU] | Freq: Once | INTRAMUSCULAR | Status: DC

## 2012-06-25 MED ORDER — ANTICOAGULANT SODIUM CITRATE 4% (200MG/5ML) IV SOLN
5.0000 mL | Freq: Once | Status: AC
  Administered 2012-06-25: 5 mL via INTRAVENOUS

## 2012-06-25 MED ORDER — DIPHENHYDRAMINE HCL 25 MG PO CAPS: 25.0000 mg | ORAL_CAPSULE | Freq: Four times a day (QID) | ORAL | Status: DC | PRN

## 2012-06-25 MED ORDER — CALCIUM CARBONATE ANTACID 500 MG PO CHEW
2.0000 | CHEWABLE_TABLET | ORAL | Status: AC
  Administered 2012-06-25 (×2): 400 mg via ORAL

## 2012-06-25 MED ORDER — SODIUM CHLORIDE 0.9 % IV SOLN
4.0000 g | Freq: Once | INTRAVENOUS | Status: DC
  Administered 2012-06-25: 4 g via INTRAVENOUS
  Filled 2012-06-25: qty 40

## 2012-06-25 NOTE — Progress Notes (Signed)
TPE-Plasma exchange completed without issue. Pt tolerated very well. Vitals stable at reinfusion. Pt aware to be back on Friday 16th at 0800am for another exchange. MD will need to put in orders please.

## 2012-06-25 NOTE — H&P (Signed)
Nicole Strong is an 62 y.o. female.   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 Diabetes mellitus     2  . Hyperlipidemia   . Hypertension   . Renal mass     BEING WORKED UP BY UROLOGY  . Chronic inflammatory demyelinating polyneuritis   . Guillain-Barre syndrome   . Renal disorder     L kidney removed    Past Surgical History  Procedure Laterality Date  . Cesarean section    . Cholecystectomy    . Nephrectomy      Family History  Problem Relation Age of Onset  . Cancer Father     PROSTATE  . Cancer Sister     BREAST  . Healthy Mother    Social History:  reports that she has quit smoking. She has never used smokeless tobacco. She reports that she does not drink alcohol or use illicit drugs.  Allergies:  Allergies  Allergen Reactions  . Biaxin (Clarithromycin) Other (See Comments)    "face turns red"  . Codeine Nausea And Vomiting    Hallucinations  . Erythromycin Nausea And Vomiting  . Hydrocodone-Acetaminophen Other (See Comments)    "head feels funny"    Medications Prior to Admission  Medication Sig Dispense Refill  . amitriptyline (ELAVIL) 10 MG tablet Take 30 mg by mouth at bedtime.      . calcium carbonate (OS-CAL) 600 MG TABS Take 600 mg by mouth every morning.       . Cholecalciferol (VITAMIN D-3) 1000 UNITS CAPS Take 5,000 Units by mouth daily.      . COD LIVER OIL PO Take 1 capsule by mouth daily.      . diphenoxylate-atropine (LOMOTIL) 2.5-0.025 MG per tablet Take 1-2 tablets by mouth 4 (four) times daily as needed for diarrhea or loose stools.  30 tablet  0  . ezetimibe (ZETIA) 10 MG tablet Take 10 mg by mouth at bedtime.      . gabapentin (NEURONTIN) 300 MG capsule Take 300  mg by mouth 4 (four) times daily.      Marland Kitchen lisinopril (PRINIVIL,ZESTRIL) 10 MG tablet Take 10 mg by mouth every evening.       . metFORMIN (GLUCOPHAGE) 1000 MG tablet Take 1,000 mg by mouth 2 (two) times daily with a meal.      . oxyCODONE-acetaminophen (PERCOCET/ROXICET) 5-325 MG per tablet Take 1 tablet by mouth every 4 (four) hours as needed for pain.      . promethazine (PHENERGAN) 25 MG tablet Take 1 tablet (25 mg total) by mouth every 6 (six) hours as needed for nausea.  30 tablet  0  . traMADol (ULTRAM) 50 MG tablet Take 100 mg by mouth 2 (two) times daily as needed for pain.         Results for orders placed during the hospital encounter of 06/25/12 (from the past 48 hour(s))  CBC     Status: Abnormal   Collection Time    06/25/12  3:23 PM      Result Value Range   WBC 7.7  4.0 - 10.5 K/uL   RBC 3.95  3.87 - 5.11 MIL/uL   Hemoglobin 11.9 (*) 12.0 - 15.0 g/dL   HCT 33.9 (*) 36.0 - 46.0 %  MCV 85.8  78.0 - 100.0 fL   MCH 30.1  26.0 - 34.0 pg   MCHC 35.1  30.0 - 36.0 g/dL   RDW 13.7  11.5 - 15.5 %   Platelets 187  150 - 400 K/uL  COMPREHENSIVE METABOLIC PANEL     Status: Abnormal   Collection Time    06/25/12  3:23 PM      Result Value Range   Sodium 136  135 - 145 mEq/L   Potassium 3.9  3.5 - 5.1 mEq/L   Chloride 99  96 - 112 mEq/L   CO2 25  19 - 32 mEq/L   Glucose, Bld 319 (*) 70 - 99 mg/dL   BUN 12  6 - 23 mg/dL   Creatinine, Ser 0.86  0.50 - 1.10 mg/dL   Calcium 9.7  8.4 - 10.5 mg/dL   Total Protein 5.8 (*) 6.0 - 8.3 g/dL   Albumin 3.6  3.5 - 5.2 g/dL   AST 30  0 - 37 U/L   ALT 19  0 - 35 U/L   Alkaline Phosphatase 61  39 - 117 U/L   Total Bilirubin 0.4  0.3 - 1.2 mg/dL   GFR calc non Af Amer 70 (*) >90 mL/min   GFR calc Af Amer 82 (*) >90 mL/min   Comment:            The eGFR has been calculated     using the CKD EPI equation.     This calculation has not been     validated in all clinical     situations.     eGFR's persistently     <90 mL/min signify      possible Chronic Kidney Disease.   No results found.  Review of Systems  Constitutional: Negative for fever.  HENT: Negative for neck pain.   Respiratory: Negative for shortness of breath.   Cardiovascular: Negative for chest pain.  Gastrointestinal: Negative for abdominal pain.  Musculoskeletal: Negative for back pain and joint pain.  Neurological: Positive for dizziness and weakness. Negative for headaches.    Blood pressure 127/42, pulse 77, temperature 98.4 F (36.9 C), temperature source Oral, resp. rate 15, SpO2 98.00%. Physical Exam  Constitutional: She is oriented to person, place, and time. She appears well-developed and well-nourished.  Cardiovascular: Normal rate, regular rhythm and normal heart sounds.   No murmur heard. Respiratory: Effort normal and breath sounds normal. She has no wheezes.  GI: Soft. Bowel sounds are normal. There is no tenderness.  Musculoskeletal: Normal range of motion.  B leg weakness  Neurological: She is alert and oriented to person, place, and time. Coordination abnormal.  Psychiatric: She has a normal mood and affect. Her behavior is normal. Judgment and thought content normal.     Assessment/Plan Plasma pheresis catheter placement for GBS/CIDS treatment Pt aware of procedure benefits and risks and agreeable to proceed Consent signed and in chart  [name] 06/25/2012, 4:21 PM

## 2012-06-25 NOTE — Telephone Encounter (Signed)
Christine calling from Hemodialysis. [id number], needing orders each time pt to have her treatment. Please place orders.  Marland Kitchen

## 2012-06-26 LAB — POCT I-STAT, CHEM 8
Chloride: 100 mEq/L (ref 96–112)
HCT: 35 % — ABNORMAL LOW (ref 36.0–46.0)
Hemoglobin: 11.9 g/dL — ABNORMAL LOW (ref 12.0–15.0)
Potassium: 3.8 mEq/L (ref 3.5–5.1)
Sodium: 137 mEq/L (ref 135–145)

## 2012-06-27 ENCOUNTER — Non-Acute Institutional Stay (HOSPITAL_COMMUNITY)
Admission: AD | Admit: 2012-06-27 | Discharge: 2012-06-27 | Disposition: A | Discharge status: Home / Self Care | Payer: Medicare Other | Source: Ambulatory Visit | Attending: Neurology | Admitting: Neurology

## 2012-06-27 DIAGNOSIS — G6181 Chronic inflammatory demyelinating polyneuritis: Secondary | ICD-10-CM | POA: Insufficient documentation

## 2012-06-27 LAB — COMPREHENSIVE METABOLIC PANEL
ALT: 12 U/L (ref 0–35)
AST: 11 U/L (ref 0–37)
Alkaline Phosphatase: 46 U/L (ref 39–117)
CO2: 22 mEq/L (ref 19–32)
Chloride: 98 mEq/L (ref 96–112)
GFR calc Af Amer: 85 mL/min — ABNORMAL LOW (ref >90)
GFR calc non Af Amer: 73 mL/min — ABNORMAL LOW (ref >90)
Glucose, Bld: 414 mg/dL — ABNORMAL HIGH (ref 70–99)
Sodium: 135 mEq/L (ref 135–145)
Total Bilirubin: 0.3 mg/dL (ref 0.3–1.2)

## 2012-06-27 LAB — CBC
Hemoglobin: 10.7 g/dL — ABNORMAL LOW (ref 12.0–15.0)
MCH: 30 pg (ref 26.0–34.0)
MCV: 86.8 fL (ref 78.0–100.0)
Platelets: 177 10*3/uL (ref 150–400)
RBC: 3.57 MIL/uL — ABNORMAL LOW (ref 3.87–5.11)
WBC: 7.4 10*3/uL (ref 4.0–10.5)

## 2012-06-27 MED ORDER — CALCIUM CARBONATE ANTACID 500 MG PO CHEW: 2.0000 | CHEWABLE_TABLET | ORAL | Status: AC

## 2012-06-27 MED ORDER — SODIUM CHLORIDE 0.9 % IV SOLN
4.0000 g | Freq: Once | INTRAVENOUS | Status: AC
  Administered 2012-06-27: 4 g via INTRAVENOUS
  Filled 2012-06-27: qty 40

## 2012-06-27 MED ORDER — DIPHENHYDRAMINE HCL 25 MG PO CAPS: 25.0000 mg | ORAL_CAPSULE | Freq: Four times a day (QID) | ORAL | Status: DC | PRN

## 2012-06-27 MED ORDER — ACD FORMULA A 0.73-2.45-2.2 GM/100ML VI SOLN
500.0000 mL | Status: DC
  Administered 2012-06-27: 500 mL via INTRAVENOUS

## 2012-06-27 MED ORDER — ANTICOAGULANT SODIUM CITRATE 4% (200MG/5ML) IV SOLN
5.0000 mL | Freq: Once | Status: AC
  Administered 2012-06-27: 5 mL
  Filled 2012-06-27: qty 250

## 2012-06-27 MED ORDER — SODIUM CHLORIDE 0.9 % IV SOLN
INTRAVENOUS | Status: AC
  Administered 2012-06-27 (×3): via INTRAVENOUS_CENTRAL
  Filled 2012-06-27 (×3): qty 200

## 2012-06-27 MED ORDER — ACETAMINOPHEN 325 MG PO TABS: 650.0000 mg | ORAL_TABLET | ORAL | Status: DC | PRN

## 2012-06-27 MED ORDER — SODIUM CHLORIDE 0.9 % IV SOLN
INTRAVENOUS | Status: DC
  Filled 2012-06-27 (×5): qty 200

## 2012-06-27 MED ORDER — CALCIUM CARBONATE ANTACID 500 MG PO CHEW
2.0000 | CHEWABLE_TABLET | ORAL | Status: AC
  Administered 2012-06-27 (×2): 400 mg via ORAL

## 2012-06-27 NOTE — H&P (Cosign Needed)
Nicole Strong is an 62 y.o. female.   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 An initial treatment with plasma exchange in 2011 helped an adult the patient to resume some ambulation and transfer from a seated position. Since 2012 and 3013 the patient has worsened slowly again and resides now in a extended care facility but is preparing for her discharge home. Scheduled for Plasma pheresis catheter placement for treatment. The day prior to her beginning her second cycle of plasma exchange. The first dorsal this patient was given on 06/22/2012. Per [name], MD at [facility].   Dx: 2011:  Plasma Pheresis x 5- did help hold progression.  Exchange scheduled for 3 days a week.   HPI: DM; HTN; HLD; renal ca- left kidney removal  Past Medical History  Diagnosis Date  . Diabetes mellitus     2  . Hyperlipidemia   . Hypertension   . Renal mass     BEING WORKED UP BY UROLOGY  . Chronic inflammatory demyelinating polyneuritis   . Guillain-Barre syndrome   . Renal disorder     L kidney removed    Past Surgical History  Procedure Laterality Date  . Cesarean section    . Cholecystectomy    . Nephrectomy      Family History  Problem Relation Age of Onset  . Cancer Father     PROSTATE  . Cancer Sister     BREAST  . Healthy Mother    Social History:  reports that she has quit smoking. She has never used smokeless tobacco. She reports that she does not drink alcohol or use illicit drugs.  Allergies:  Allergies  Allergen Reactions  . Biaxin (Clarithromycin) Other (See Comments)    "face turns red"  . Codeine Nausea And Vomiting    Hallucinations  . Erythromycin Nausea And Vomiting  . Hydrocodone-Acetaminophen Other (See Comments)    "head feels funny"    No prescriptions prior to admission    Results for orders placed during the  hospital encounter of 06/27/12 (from the past 48 hour(s))  CBC     Status: Abnormal   Collection Time    06/27/12 10:00 AM      Result Value Range   WBC 7.4  4.0 - 10.5 K/uL   RBC 3.57 (*) 3.87 - 5.11 MIL/uL   Hemoglobin 10.7 (*) 12.0 - 15.0 g/dL   HCT 31.0 (*) 36.0 - 46.0 %   MCV 86.8  78.0 - 100.0 fL   MCH 30.0  26.0 - 34.0 pg   MCHC 34.5  30.0 - 36.0 g/dL   RDW 13.6  11.5 - 15.5 %   Platelets 177  150 - 400 K/uL  COMPREHENSIVE METABOLIC PANEL     Status: Abnormal   Collection Time    06/27/12 10:00 AM      Result Value Range   Sodium 135  135 - 145 mEq/L   Potassium 3.8  3.5 - 5.1 mEq/L   Chloride 98  96 - 112 mEq/L   CO2 22  19 - 32 mEq/L   Glucose, Bld 414 (*) 70 - 99 mg/dL   BUN 11  6 - 23 mg/dL   Creatinine, Ser 0.83  0.50 - 1.10 mg/dL   Calcium 9.3  8.4 - 10.5 mg/dL   Total Protein 5.5 (*) 6.0 - 8.3 g/dL   Albumin 3.9  3.5 - 5.2 g/dL  AST 11  0 - 37 U/L   ALT 12  0 - 35 U/L   Alkaline Phosphatase 46  39 - 117 U/L   Total Bilirubin 0.3  0.3 - 1.2 mg/dL   GFR calc non Af Amer 73 (*) >90 mL/min   GFR calc Af Amer 85 (*) >90 mL/min   Comment:            The eGFR has been calculated     using the CKD EPI equation.     This calculation has not been     validated in all clinical     situations.     eGFR's persistently     <90 mL/min signify     possible Chronic Kidney Disease.   No results found.  Review of Systems  Constitutional: Negative for fever.  HENT: Negative for neck pain.   Respiratory: Negative for shortness of breath.   Cardiovascular: Negative for chest pain.  Gastrointestinal: Negative for abdominal pain.  Musculoskeletal: Negative for back pain and joint pain.  Neurological: Positive for dizziness and weakness. Negative for headaches.    Blood pressure 138/54, pulse 75, temperature 98.1 F (36.7 C), temperature source Oral, resp. rate 18, SpO2 95.00%. Physical Exam  Constitutional: She is oriented to person, place, and time. She appears  well-developed and well-nourished.  Cardiovascular: Normal rate, regular rhythm and normal heart sounds.   No murmur heard. Respiratory: Effort normal and breath sounds normal. She has no wheezes.  GI: Soft. Bowel sounds are normal. There is no tenderness.  Musculoskeletal: Normal range of motion.  B leg weakness  Neurological: She is alert and oriented to person, place, and time. Coordination abnormal.  Psychiatric: She has a normal mood and affect. Her behavior is normal. Judgment and thought content normal.     Assessment/Plan Plasma pheresis catheter placement for GBS/CIDS treatment Pt aware of procedure benefits and risks and agreeable to proceed. Patient will need Chem 7 prior to next treatment on Monday 26.th May 2014 ( [name], Nicole Strong)  Consent signed and in chart  [facility] 06/27/2012, 12:08 PM

## 2012-06-27 NOTE — Progress Notes (Signed)
Tpe completed without issue. Pt discharged to home in stable condition. Discussed appointment time for Monday 5/19 at 1pm.

## 2012-06-27 NOTE — Progress Notes (Signed)
  Subjective:    Patient ID: Nicole Strong, female    DOB: 1950/10/12, 62 y.o.   MRN: [id number]  HPI    Review of Systems     Objective:   Physical Exam        Assessment & Plan:

## 2012-06-27 NOTE — H&P (Signed)
Nicole Strong is an 62 y.o. female.   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 An initial treatment with plasma exchange in 2011 helped an adult the patient to resume some ambulation and transfer from a seated position. Since 2012 and 3013 the patient has worsened slowly again and resides now in a extended care facility but is preparing for her discharge home. Scheduled for Plasma pheresis catheter placement for treatment. The day prior to her beginning her second cycle of plasma exchange. The first dorsal this patient was given on 06/22/2012. Per [name], MD at [facility].   Dx: 2011:  Plasma Pheresis x 5- did help hold progression.  Exchange scheduled for 3 days a week.   HPI: DM; HTN; HLD; renal ca- left kidney removal  Past Medical History  Diagnosis Date  . Diabetes mellitus     2  . Hyperlipidemia   . Hypertension   . Renal mass     BEING WORKED UP BY UROLOGY  . Chronic inflammatory demyelinating polyneuritis   . Guillain-Barre syndrome   . Renal disorder     L kidney removed    Past Surgical History  Procedure Laterality Date  . Cesarean section    . Cholecystectomy    . Nephrectomy      Family History  Problem Relation Age of Onset  . Cancer Father     PROSTATE  . Cancer Sister     BREAST  . Healthy Mother    Social History:  reports that she has quit smoking. She has never used smokeless tobacco. She reports that she does not drink alcohol or use illicit drugs.  Allergies:  Allergies  Allergen Reactions  . Biaxin (Clarithromycin) Other (See Comments)    "face turns red"  . Codeine Nausea And Vomiting    Hallucinations  . Erythromycin Nausea And Vomiting  . Hydrocodone-Acetaminophen Other (See Comments)    "head feels funny"    No prescriptions prior to admission    No results found for this or any previous  visit (from the past 48 hour(s)). No results found.  Review of Systems  Constitutional: Negative for fever.  HENT: Negative for neck pain.   Respiratory: Negative for shortness of breath.   Cardiovascular: Negative for chest pain.  Gastrointestinal: Negative for abdominal pain.  Musculoskeletal: Negative for back pain and joint pain.  Neurological: Positive for dizziness and weakness. Negative for headaches.    Blood pressure 118/60, pulse 79, temperature 98.1 F (36.7 C), temperature source Axillary, resp. rate 17, SpO2 99.00%. Physical Exam  Constitutional: She is oriented to person, place, and time. She appears well-developed and well-nourished.  Cardiovascular: Normal rate, regular rhythm and normal heart sounds.   No murmur heard. Respiratory: Effort normal and breath sounds normal. She has no wheezes.  GI: Soft. Bowel sounds are normal. There is no tenderness.  Musculoskeletal: Normal range of motion.  B leg weakness  Neurological: She is alert and oriented to person, place, and time. Coordination abnormal.  Psychiatric: She has a normal mood and affect. Her behavior is normal. Judgment and thought content normal.     Assessment/Plan Plasma pheresis catheter placement for GBS/CIDS treatment Pt aware of procedure benefits and risks and agreeable to proceed. Patient will need Chem 7 prior to next treatment on Monday 26.th May 2014 ( [name], [name])  Consent signed and in chart  [facility] 06/27/2012, 11:59 AM

## 2012-06-30 ENCOUNTER — Non-Acute Institutional Stay (HOSPITAL_COMMUNITY)
Admission: AD | Admit: 2012-06-30 | Discharge: 2012-06-30 | Disposition: A | Discharge status: Home / Self Care | Payer: Medicare Other | Source: Ambulatory Visit | Attending: Neurology | Admitting: Neurology

## 2012-06-30 DIAGNOSIS — G6181 Chronic inflammatory demyelinating polyneuritis: Secondary | ICD-10-CM | POA: Insufficient documentation

## 2012-06-30 LAB — CBC
HCT: 31.3 % — ABNORMAL LOW (ref 36.0–46.0)
Hemoglobin: 10.7 g/dL — ABNORMAL LOW (ref 12.0–15.0)
MCV: 87.7 fL (ref 78.0–100.0)
Platelets: 250 10*3/uL (ref 150–400)
RBC: 3.57 MIL/uL — ABNORMAL LOW (ref 3.87–5.11)
WBC: 9.6 10*3/uL (ref 4.0–10.5)

## 2012-06-30 LAB — COMPREHENSIVE METABOLIC PANEL
AST: 18 U/L (ref 0–37)
Albumin: 4.1 g/dL (ref 3.5–5.2)
BUN: 14 mg/dL (ref 6–23)
Calcium: 9.6 mg/dL (ref 8.4–10.5)
Creatinine, Ser: 0.88 mg/dL (ref 0.50–1.10)
Total Bilirubin: 0.4 mg/dL (ref 0.3–1.2)
Total Protein: 6.5 g/dL (ref 6.0–8.3)

## 2012-06-30 MED ORDER — ACD FORMULA A 0.73-2.45-2.2 GM/100ML VI SOLN
500.0000 mL | Status: DC
  Administered 2012-06-30: 500 mL via INTRAVENOUS

## 2012-06-30 MED ORDER — ACETAMINOPHEN 325 MG PO TABS: 650.0000 mg | ORAL_TABLET | ORAL | Status: DC | PRN

## 2012-06-30 MED ORDER — CALCIUM CARBONATE ANTACID 500 MG PO CHEW
2.0000 | CHEWABLE_TABLET | ORAL | Status: DC
  Administered 2012-06-30: 400 mg via ORAL

## 2012-06-30 MED ORDER — SODIUM CHLORIDE 0.9 % IV SOLN
INTRAVENOUS | Status: AC
  Administered 2012-06-30 (×3): via INTRAVENOUS_CENTRAL
  Filled 2012-06-30 (×3): qty 200

## 2012-06-30 MED ORDER — ANTICOAGULANT SODIUM CITRATE 4% (200MG/5ML) IV SOLN
5.0000 mL | Freq: Once | Status: AC
  Administered 2012-06-30: 5 mL
  Filled 2012-06-30: qty 250

## 2012-06-30 MED ORDER — SODIUM CHLORIDE 0.9 % IV SOLN
4.0000 g | Freq: Once | INTRAVENOUS | Status: AC
  Administered 2012-06-30: 4 g via INTRAVENOUS
  Filled 2012-06-30 (×2): qty 40

## 2012-06-30 MED ORDER — ALTEPLASE 100 MG IV SOLR
4.0000 mg | Freq: Once | INTRAVENOUS | Status: AC
  Administered 2012-06-30: 4 mg
  Filled 2012-06-30: qty 4

## 2012-06-30 MED ORDER — DIPHENHYDRAMINE HCL 25 MG PO CAPS: 25.0000 mg | ORAL_CAPSULE | Freq: Four times a day (QID) | ORAL | Status: DC | PRN

## 2012-06-30 NOTE — H&P (Signed)
Nicole Strong is an 62 y.o. female.   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 An initial treatment with plasma exchange in 2011 helped an adult the patient to resume some ambulation and transfer from a seated position. Since 2012 and 3013 the patient has worsened slowly again and resides now in a extended care facility but is preparing for her discharge home. Scheduled for Plasma pheresis catheter placement for treatment. The day prior to her beginning her second cycle of plasma exchange. The first dorsal this patient was given on 06/22/2012. Per [name], MD at [facility].   Dx: 2011:  Plasma Pheresis x 5- did help hold progression.  Exchange scheduled for 3 days a week.   HPI: DM; HTN; HLD; renal ca- left kidney removal  Past Medical History  Diagnosis Date  . Diabetes mellitus     2  . Hyperlipidemia   . Hypertension   . Renal mass     BEING WORKED UP BY UROLOGY  . Chronic inflammatory demyelinating polyneuritis   . Guillain-Barre syndrome   . Renal disorder     L kidney removed    Past Surgical History  Procedure Laterality Date  . Cesarean section    . Cholecystectomy    . Nephrectomy      Family History  Problem Relation Age of Onset  . Cancer Father     PROSTATE  . Cancer Sister     BREAST  . Healthy Mother    Social History:  reports that she has quit smoking. She has never used smokeless tobacco. She reports that she does not drink alcohol or use illicit drugs.  Allergies:  Allergies  Allergen Reactions  . Biaxin (Clarithromycin) Other (See Comments)    "face turns red"  . Codeine Nausea And Vomiting    Hallucinations  . Erythromycin Nausea And Vomiting  . Hydrocodone-Acetaminophen Other (See Comments)    "head feels funny"    Medications Prior to Admission  Medication Sig Dispense Refill  . amitriptyline  (ELAVIL) 10 MG tablet Take 30 mg by mouth at bedtime.      . calcium carbonate (OS-CAL) 600 MG TABS Take 600 mg by mouth every morning.       . Cholecalciferol (VITAMIN D-3) 1000 UNITS CAPS Take 5,000 Units by mouth daily.      . COD LIVER OIL PO Take 1 capsule by mouth daily.      . diphenoxylate-atropine (LOMOTIL) 2.5-0.025 MG per tablet Take 1-2 tablets by mouth 4 (four) times daily as needed for diarrhea or loose stools.  30 tablet  0  . ezetimibe (ZETIA) 10 MG tablet Take 10 mg by mouth at bedtime.      . gabapentin (NEURONTIN) 300 MG capsule Take 300 mg by mouth 4 (four) times daily.      Marland Kitchen lisinopril (PRINIVIL,ZESTRIL) 10 MG tablet Take 10 mg by mouth every evening.       . metFORMIN (GLUCOPHAGE) 1000 MG tablet Take 1,000 mg by mouth 2 (two) times daily with a meal.      . oxyCODONE-acetaminophen (PERCOCET/ROXICET) 5-325 MG per tablet Take 1 tablet by mouth every 4 (four) hours as needed for pain.      . promethazine (PHENERGAN) 25 MG tablet Take 1 tablet (25 mg total) by mouth every 6 (six) hours as needed for nausea.  30 tablet  0  . traMADol (ULTRAM) 50 MG tablet Take 100  mg by mouth 2 (two) times daily as needed for pain.         Results for orders placed during the hospital encounter of 06/30/12 (from the past 48 hour(s))  CBC     Status: Abnormal   Collection Time    06/30/12 12:54 PM      Result Value Range   WBC 9.6  4.0 - 10.5 K/uL   RBC 3.57 (*) 3.87 - 5.11 MIL/uL   Hemoglobin 10.7 (*) 12.0 - 15.0 g/dL   HCT 31.3 (*) 36.0 - 46.0 %   MCV 87.7  78.0 - 100.0 fL   MCH 30.0  26.0 - 34.0 pg   MCHC 34.2  30.0 - 36.0 g/dL   RDW 13.5  11.5 - 15.5 %   Platelets 250  150 - 400 K/uL   No results found.  Review of Systems  Constitutional: Negative for fever.  HENT: Negative for neck pain.   Respiratory: Negative for shortness of breath.   Cardiovascular: Negative for chest pain.  Gastrointestinal: Negative for abdominal pain.  Musculoskeletal: Negative for back pain and joint  pain.  Neurological: Positive for dizziness and weakness. Negative for headaches.    Blood pressure 147/63, pulse 85, temperature 98.4 F (36.9 C), temperature source Oral, resp. rate 25, SpO2 97.00%. Physical Exam  Constitutional: She is oriented to person, place, and time. She appears well-developed and well-nourished.  Cardiovascular: Normal rate, regular rhythm and normal heart sounds.   No murmur heard. Respiratory: Effort normal and breath sounds normal. She has no wheezes.  GI: Soft. Bowel sounds are normal. There is no tenderness.  Musculoskeletal: Normal range of motion.  B leg weakness  Neurological: She is alert and oriented to person, place, and time. Coordination abnormal.  Psychiatric: She has a normal mood and affect. Her behavior is normal. Judgment and thought content normal.     Assessment/Plan Plasma pheresis catheter placement for GBS/CIDS treatment Pt aware of procedure benefits and risks and agreeable to proceed. Patient will need Chem 7 prior to next treatment on Monday 26.th May 2014 ( [name], [name])  Consent signed and in chart  [facility] 06/30/2012, 1:52 PM

## 2012-06-30 NOTE — Progress Notes (Signed)
Albumin exchange complete without adverse events.  Vital signs are stable and pt is without complaint.  Pt is aware of her next appointment on Wednesday, Jul 02, 2012.  Pt is being discharged to home.

## 2012-07-02 ENCOUNTER — Non-Acute Institutional Stay (HOSPITAL_COMMUNITY)
Admission: AD | Admit: 2012-07-02 | Discharge: 2012-07-02 | Disposition: A | Discharge status: Home / Self Care | Payer: Medicare Other | Source: Ambulatory Visit | Attending: Neurology | Admitting: Neurology

## 2012-07-02 DIAGNOSIS — G6181 Chronic inflammatory demyelinating polyneuritis: Secondary | ICD-10-CM | POA: Insufficient documentation

## 2012-07-02 DIAGNOSIS — Z885 Allergy status to narcotic agent status: Secondary | ICD-10-CM | POA: Diagnosis not present

## 2012-07-02 DIAGNOSIS — Z79899 Other long term (current) drug therapy: Secondary | ICD-10-CM | POA: Insufficient documentation

## 2012-07-02 DIAGNOSIS — E785 Hyperlipidemia, unspecified: Secondary | ICD-10-CM | POA: Insufficient documentation

## 2012-07-02 DIAGNOSIS — E119 Type 2 diabetes mellitus without complications: Secondary | ICD-10-CM | POA: Diagnosis not present

## 2012-07-02 DIAGNOSIS — Z85528 Personal history of other malignant neoplasm of kidney: Secondary | ICD-10-CM | POA: Diagnosis not present

## 2012-07-02 DIAGNOSIS — Z905 Acquired absence of kidney: Secondary | ICD-10-CM | POA: Diagnosis not present

## 2012-07-02 DIAGNOSIS — Z881 Allergy status to other antibiotic agents status: Secondary | ICD-10-CM | POA: Diagnosis not present

## 2012-07-02 LAB — CBC
HCT: 28.5 % — ABNORMAL LOW (ref 36.0–46.0)
Hemoglobin: 9.6 g/dL — ABNORMAL LOW (ref 12.0–15.0)
MCH: 29.6 pg (ref 26.0–34.0)
MCHC: 33.7 g/dL (ref 30.0–36.0)
MCV: 88 fL (ref 78.0–100.0)
Platelets: 248 K/uL (ref 150–400)
RBC: 3.24 MIL/uL — ABNORMAL LOW (ref 3.87–5.11)
RDW: 13.3 % (ref 11.5–15.5)
WBC: 8 K/uL (ref 4.0–10.5)

## 2012-07-02 LAB — COMPREHENSIVE METABOLIC PANEL
BUN: 12 mg/dL (ref 6–23)
CO2: 27 mEq/L (ref 19–32)
Calcium: 9.1 mg/dL (ref 8.4–10.5)
Creatinine, Ser: 0.78 mg/dL (ref 0.50–1.10)
GFR calc Af Amer: >90 mL/min (ref >90)
GFR calc non Af Amer: 87 mL/min — ABNORMAL LOW (ref >90)
Glucose, Bld: 389 mg/dL — ABNORMAL HIGH (ref 70–99)

## 2012-07-02 LAB — POCT I-STAT, CHEM 8
BUN: 15 mg/dL (ref 6–23)
Calcium, Ion: 1.13 mmol/L (ref 1.13–1.30)
Chloride: 96 mEq/L (ref 96–112)
HCT: 29 % — ABNORMAL LOW (ref 36.0–46.0)
Potassium: 3.9 mEq/L (ref 3.5–5.1)
Sodium: 134 mEq/L — ABNORMAL LOW (ref 135–145)
TCO2: 29 mmol/L (ref 0–100)

## 2012-07-02 MED ORDER — ANTICOAGULANT SODIUM CITRATE 4% (200MG/5ML) IV SOLN
5.0000 mL | Freq: Once | Status: AC
  Administered 2012-07-02: 5 mL
  Filled 2012-07-02: qty 250

## 2012-07-02 MED ORDER — CALCIUM CARBONATE ANTACID 500 MG PO CHEW
800.0000 mg | CHEWABLE_TABLET | Freq: Two times a day (BID) | ORAL | Status: DC
Start: 2012-07-02 — End: 2012-07-02

## 2012-07-02 MED ORDER — SODIUM CHLORIDE 0.9 % IV SOLN
Freq: Once | INTRAVENOUS | Status: AC
  Administered 2012-07-02: 15:00:00 via INTRAVENOUS_CENTRAL
  Filled 2012-07-02: qty 200

## 2012-07-02 MED ORDER — ACETAMINOPHEN 325 MG PO TABS: 650.0000 mg | ORAL_TABLET | ORAL | Status: DC | PRN

## 2012-07-02 MED ORDER — SODIUM CHLORIDE 0.9 % IV SOLN
4.0000 g | Freq: Once | INTRAVENOUS | Status: AC
  Administered 2012-07-02: 4 g via INTRAVENOUS
  Filled 2012-07-02 (×2): qty 40

## 2012-07-02 MED ORDER — SODIUM CHLORIDE 0.9 % IV SOLN
Freq: Once | INTRAVENOUS | Status: AC
  Administered 2012-07-02: 16:00:00 via INTRAVENOUS_CENTRAL
  Filled 2012-07-02 (×4): qty 200

## 2012-07-02 MED ORDER — ACD FORMULA A 0.73-2.45-2.2 GM/100ML VI SOLN
500.0000 mL | Status: DC
  Administered 2012-07-02: 500 mL via INTRAVENOUS

## 2012-07-02 MED ORDER — DIPHENHYDRAMINE HCL 25 MG PO CAPS: 25.0000 mg | ORAL_CAPSULE | Freq: Four times a day (QID) | ORAL | Status: DC | PRN

## 2012-07-02 MED ORDER — CALCIUM CARBONATE ANTACID 500 MG PO CHEW
2.0000 | CHEWABLE_TABLET | ORAL | Status: DC
  Administered 2012-07-02: 400 mg via ORAL

## 2012-07-02 MED ORDER — SODIUM CHLORIDE 0.9 % IV SOLN
Freq: Once | INTRAVENOUS | Status: AC
  Administered 2012-07-02: 16:00:00 via INTRAVENOUS_CENTRAL
  Filled 2012-07-02: qty 200

## 2012-07-02 MED ORDER — ANTICOAGULANT SODIUM CITRATE 4% (200MG/5ML) IV SOLN
5.0000 mL | Freq: Once | Status: DC
  Filled 2012-07-02: qty 250

## 2012-07-02 MED ORDER — ALTEPLASE 100 MG IV SOLR
5.0000 mg | Freq: Once | INTRAVENOUS | Status: AC
  Administered 2012-07-02: 5 mg
  Filled 2012-07-02: qty 5

## 2012-07-02 NOTE — Progress Notes (Signed)
Pt catheter not working well; TPA for 45 mins  was able complete the tx. The arterial port still not working well. Tx completed without complications, pt denies SOB, dizziness and pain; no s/s of hypocalcemia noted. Pt escorted to a family friend in the hall way to go home.

## 2012-07-04 ENCOUNTER — Telehealth: Payer: Self-pay | Admitting: Neurology

## 2012-07-04 DIAGNOSIS — G6181 Chronic inflammatory demyelinating polyneuritis: Secondary | ICD-10-CM

## 2012-07-04 NOTE — Telephone Encounter (Signed)
patient has finished last plasma treatment and needs her catherer removed,causing her problems.

## 2012-07-04 NOTE — Telephone Encounter (Signed)
Please  Have RN send her to cath removal at the interventional radiology. CD

## 2012-07-08 NOTE — Telephone Encounter (Signed)
Spoke with Dr. Brett Fairy and called patient. We will make arrangements for patient to have catheter removed. I also gave patient phone number to Interventional radoilogy and called and left them a voice message. Jannifer Rodney Schwalbach BS RN/ SY

## 2012-07-11 ENCOUNTER — Ambulatory Visit (HOSPITAL_COMMUNITY)
Admission: RE | Admit: 2012-07-11 | Discharge: 2012-07-11 | Disposition: A | Discharge status: Home / Self Care | Payer: Medicare Other | Source: Ambulatory Visit | Attending: Neurology | Admitting: Neurology

## 2012-07-11 VITALS — BP 194/77 | Resp 14

## 2012-07-11 DIAGNOSIS — Z452 Encounter for adjustment and management of vascular access device: Secondary | ICD-10-CM | POA: Diagnosis not present

## 2012-07-11 DIAGNOSIS — G610 Guillain-Barre syndrome: Secondary | ICD-10-CM | POA: Diagnosis not present

## 2012-07-11 DIAGNOSIS — G6181 Chronic inflammatory demyelinating polyneuritis: Secondary | ICD-10-CM

## 2012-07-11 MED ORDER — CHLORHEXIDINE GLUCONATE 4 % EX LIQD
CUTANEOUS | Status: AC
  Filled 2012-07-11: qty 30

## 2012-07-11 NOTE — Procedures (Signed)
Removal tunneled pheresis catheter No complication No blood loss. See complete dictation in [facility].

## 2012-07-12 NOTE — Telephone Encounter (Signed)
Patient was scheduled for 07-11-12 at 1100 to have catheter removed by IR.

## 2012-07-24 ENCOUNTER — Ambulatory Visit (INDEPENDENT_AMBULATORY_CARE_PROVIDER_SITE_OTHER): Payer: BC Managed Care – PPO | Admitting: Family Medicine

## 2012-07-24 ENCOUNTER — Encounter: Payer: Self-pay | Admitting: Family Medicine

## 2012-07-24 ENCOUNTER — Other Ambulatory Visit: Payer: Self-pay | Admitting: Family Medicine

## 2012-07-24 VITALS — BP 144/82 | HR 87 | Temp 98.4°F

## 2012-07-24 DIAGNOSIS — E1165 Type 2 diabetes mellitus with hyperglycemia: Secondary | ICD-10-CM

## 2012-07-24 DIAGNOSIS — E785 Hyperlipidemia, unspecified: Secondary | ICD-10-CM | POA: Diagnosis not present

## 2012-07-24 DIAGNOSIS — E118 Type 2 diabetes mellitus with unspecified complications: Secondary | ICD-10-CM

## 2012-07-24 DIAGNOSIS — L089 Local infection of the skin and subcutaneous tissue, unspecified: Secondary | ICD-10-CM

## 2012-07-24 DIAGNOSIS — E119 Type 2 diabetes mellitus without complications: Secondary | ICD-10-CM

## 2012-07-24 DIAGNOSIS — R809 Proteinuria, unspecified: Secondary | ICD-10-CM

## 2012-07-24 DIAGNOSIS — T798XXA Other early complications of trauma, initial encounter: Secondary | ICD-10-CM

## 2012-07-24 DIAGNOSIS — IMO0002 Reserved for concepts with insufficient information to code with codable children: Secondary | ICD-10-CM

## 2012-07-24 DIAGNOSIS — I10 Essential (primary) hypertension: Secondary | ICD-10-CM

## 2012-07-24 DIAGNOSIS — T148XXA Other injury of unspecified body region, initial encounter: Secondary | ICD-10-CM

## 2012-07-24 LAB — BASIC METABOLIC PANEL
GFR: 59.32 mL/min — ABNORMAL LOW (ref >60.00)
Glucose, Bld: 204 mg/dL — ABNORMAL HIGH (ref 70–99)
Potassium: 4.2 mEq/L (ref 3.5–5.1)
Sodium: 138 mEq/L (ref 135–145)

## 2012-07-24 LAB — HEPATIC FUNCTION PANEL
Alkaline Phosphatase: 75 U/L (ref 39–117)
Bilirubin, Direct: 0 mg/dL (ref 0.0–0.3)
Total Protein: 6.9 g/dL (ref 6.0–8.3)

## 2012-07-24 LAB — MICROALBUMIN / CREATININE URINE RATIO
Creatinine,U: 194 mg/dL
Microalb, Ur: 175.8 mg/dL — ABNORMAL HIGH (ref 0.0–1.9)

## 2012-07-24 LAB — LIPID PANEL
HDL: 57 mg/dL (ref >39.00)
Total CHOL/HDL Ratio: 4
VLDL: 45.2 mg/dL — ABNORMAL HIGH (ref 0.0–40.0)

## 2012-07-24 LAB — HEMOGLOBIN A1C: Hgb A1c MFr Bld: 9.6 % — ABNORMAL HIGH (ref 4.6–6.5)

## 2012-07-24 MED ORDER — CEPHALEXIN 500 MG PO CAPS: 500.0000 mg | ORAL_CAPSULE | Freq: Two times a day (BID) | ORAL | Status: DC

## 2012-07-24 MED ORDER — LISINOPRIL 10 MG PO TABS: 10.0000 mg | ORAL_TABLET | Freq: Every evening | ORAL | Status: DC

## 2012-07-24 MED ORDER — EZETIMIBE 10 MG PO TABS: 10.0000 mg | ORAL_TABLET | Freq: Every day | ORAL | Status: DC

## 2012-07-24 NOTE — Addendum Note (Signed)
Addended by: Rosalita Chessman on: 07/24/2012 02:08 PM   Modules accepted: Orders

## 2012-07-24 NOTE — Progress Notes (Signed)
  Subjective:    Patient ID: Nicole Strong, female    DOB: August 22, 1950, 62 y.o.   MRN: [id number]  HPI HYPERTENSION Disease Monitoring Blood pressure range-good most of the time Chest pain- no      Dyspnea- no Medications Compliance- good Lightheadedness- no Edem--no   DIABETES Disease Monitoring Blood Sugar ranges- not checking Polyuria- no New Visual problems- no Medications Compliance- poor Hypoglycemic symptoms- no   HYPERLIPIDEMIA Disease Monitoring See symptoms for Hypertension Medications Compliance- poor RUQ pain- no  Muscle aches- no  ROS See HPI above   PMH Smoking Status noted     Review of Systems As above    Objective:   Physical Exam BP 144/82  Pulse 87  Temp(Src) 98.4 F (36.9 C) (Oral)  SpO2 97% General appearance: alert, cooperative, appears stated age and no distress Lungs: clear to auscultation bilaterally Heart: S1, S2 normal Extremities: extremities normal, atraumatic, no cyanosis or edema Sensory exam of the foot is normal, tested with the monofilament. Good pulses, no lesions or ulcers, good peripheral pulses.         Assessment & Plan:

## 2012-07-24 NOTE — Addendum Note (Signed)
Addended by: Ewing Schlein on: 07/24/2012 02:42 PM   Modules accepted: Orders

## 2012-07-24 NOTE — Patient Instructions (Addendum)

## 2012-07-24 NOTE — Assessment & Plan Note (Signed)
Uncontrolled Check labs con't med D/w with pt imp of taking meds New glucometer given to pt

## 2012-07-24 NOTE — Assessment & Plan Note (Signed)
con't meds  Check labs 

## 2012-07-24 NOTE — Assessment & Plan Note (Signed)
Stable con't meds 

## 2012-07-25 LAB — POCT URINALYSIS DIPSTICK
Blood, UA: NEGATIVE
Protein, UA: NEGATIVE
Spec Grav, UA: >=1.03
Urobilinogen, UA: 0.2
pH, UA: 6

## 2012-07-29 ENCOUNTER — Other Ambulatory Visit: Payer: Self-pay

## 2012-07-29 DIAGNOSIS — R809 Proteinuria, unspecified: Secondary | ICD-10-CM

## 2012-07-29 DIAGNOSIS — E119 Type 2 diabetes mellitus without complications: Secondary | ICD-10-CM

## 2012-07-29 MED ORDER — COLESEVELAM HCL 625 MG PO TABS: 1875.0000 mg | ORAL_TABLET | Freq: Two times a day (BID) | ORAL | Status: DC

## 2012-07-31 ENCOUNTER — Encounter: Payer: Self-pay | Admitting: Family Medicine

## 2012-07-31 MED ORDER — MOXIFLOXACIN HCL 0.5 % OP SOLN: 1.0000 [drp] | Freq: Three times a day (TID) | OPHTHALMIC | Status: DC

## 2012-07-31 NOTE — Telephone Encounter (Signed)
-----   Message from Rosalita Chessman, DO sent at 07/31/2012 5:14 PM ----- vigamox 1 gtt tid for 7 days

## 2012-08-01 ENCOUNTER — Encounter: Payer: Self-pay | Admitting: Endocrinology

## 2012-08-01 ENCOUNTER — Ambulatory Visit (INDEPENDENT_AMBULATORY_CARE_PROVIDER_SITE_OTHER): Payer: BC Managed Care – PPO | Admitting: Endocrinology

## 2012-08-01 VITALS — BP 134/80 | Ht 61.0 in | Wt 204.0 lb

## 2012-08-01 DIAGNOSIS — E119 Type 2 diabetes mellitus without complications: Secondary | ICD-10-CM

## 2012-08-01 MED ORDER — NATEGLINIDE 120 MG PO TABS: 120.0000 mg | ORAL_TABLET | Freq: Three times a day (TID) | ORAL | Status: DC

## 2012-08-01 NOTE — Patient Instructions (Addendum)
good diet and exercise habits significanly improve the control of your diabetes.  please let me know if you wish to be referred to a dietician.  high blood sugar is very risky to your health.  you should see an eye doctor every year.  You are at higher than average risk for pneumonia and hepatitis-B.  You should be vaccinated against both.   controlling your blood pressure and cholesterol drastically reduces the damage diabetes does to your body.  this also applies to quitting smoking.  please discuss these with your doctor.  check your blood sugar once a day.  vary the time of day when you check, between before the 3 meals, and at bedtime.  also check if you have symptoms of your blood sugar being too high or too low.  please keep a record of the readings and bring it to your next appointment here.  please call us sooner if your blood sugar goes below 70, or if you have a lot of readings over 200. Please continue the welchol and metformin. i have sent a prescription to your pharmacy, to add "nateglinide." Please come back for a follow-up appointment in 3 months.

## 2012-08-01 NOTE — Progress Notes (Signed)
Subjective:    Patient ID: Nicole Strong, female    DOB: [birth year], 62 y.o.   MRN: [id number]  HPI pt states 5 years h/o dm, complicated by nephropathy.  she has never been on insulin.  pt says his diet is "fair," and exercise is limited by health problems.  Pt reports few years of slight swelling of the legs, but no assoc sob.  She did not tolerate januvia (diarrhea).  Past Medical History  Diagnosis Date  . Diabetes mellitus     2  . Hyperlipidemia   . Hypertension   . Renal mass     BEING WORKED UP BY UROLOGY  . Chronic inflammatory demyelinating polyneuritis   . Guillain-Barre syndrome   . Renal disorder     L kidney removed    Past Surgical History  Procedure Laterality Date  . Cesarean section    . Cholecystectomy    . Nephrectomy      History   Social History  . Marital Status: Divorced    Spouse Name: N/A    Number of Children: N/A  . Years of Education: N/A   Occupational History  . Not on file.   Social History Main Topics  . Smoking status: Former Research scientist (life sciences)  . Smokeless tobacco: Never Used  . Alcohol Use: No  . Drug Use: No  . Sexually Active: Not on file   Other Topics Concern  . Not on file   Social History Narrative  . No narrative on file    Current Outpatient Prescriptions on File Prior to Visit  Medication Sig Dispense Refill  . amitriptyline (ELAVIL) 10 MG tablet Take 30 mg by mouth at bedtime.      . calcium carbonate (OS-CAL) 600 MG TABS Take 600 mg by mouth every morning.       . cephALEXin (KEFLEX) 500 MG capsule Take 1 capsule (500 mg total) by mouth 2 (two) times daily.  20 capsule  0  . Cholecalciferol (VITAMIN D-3) 1000 UNITS CAPS Take 5,000 Units by mouth daily.      . COD LIVER OIL PO Take 1 capsule by mouth daily.      . colesevelam (WELCHOL) 625 MG tablet Take 3 tablets (1,875 mg total) by mouth 2 (two) times daily with a meal.  180 tablet  2  . diphenoxylate-atropine (LOMOTIL) 2.5-0.025 MG per tablet Take 1-2 tablets by mouth 4  (four) times daily as needed for diarrhea or loose stools.  30 tablet  0  . ezetimibe (ZETIA) 10 MG tablet Take 1 tablet (10 mg total) by mouth at bedtime.  90 tablet  3  . gabapentin (NEURONTIN) 300 MG capsule Take 300 mg by mouth 4 (four) times daily.      Marland Kitchen lisinopril (PRINIVIL,ZESTRIL) 10 MG tablet Take 1 tablet (10 mg total) by mouth every evening.  90 tablet  3  . metFORMIN (GLUCOPHAGE) 1000 MG tablet Take 1,000 mg by mouth 2 (two) times daily with a meal.      . moxifloxacin (VIGAMOX) 0.5 % ophthalmic solution Place 1 drop into both eyes 3 (three) times daily.  3 mL  0  . promethazine (PHENERGAN) 25 MG tablet Take 1 tablet (25 mg total) by mouth every 6 (six) hours as needed for nausea.  30 tablet  0  . traMADol (ULTRAM) 50 MG tablet Take 100 mg by mouth 2 (two) times daily as needed for pain.        No current facility-administered medications on file prior to  visit.    Allergies  Allergen Reactions  . Biaxin (Clarithromycin) Other (See Comments)    "face turns red"  . Codeine Nausea And Vomiting    Hallucinations  . Erythromycin Nausea And Vomiting  . Hydrocodone-Acetaminophen Other (See Comments)    "head feels funny"    Family History  Problem Relation Age of Onset  . Cancer Father     PROSTATE  . Cancer Sister     BREAST  . Healthy Mother     BP 134/80  Ht 5\' 1"  (1.549 m)  Wt 204 lb (92.534 kg)  BMI 38.57 kg/m2  SpO2 98%  Review of Systems denies blurry vision, headache, chest pain, sob, n/v, urinary frequency, excessive diaphoresis, memory loss, depression, menopausal sxs, rhinorrhea, and easy bruising.  She has lost weight, due to her efforts.  She has leg and foot cramps.      Objective:   Physical Exam VS: see vs page GEN: no distress.  In wheelchair. HEAD: head: no deformity eyes: no periorbital swelling, no proptosis external nose and ears are normal mouth: no lesion seen NECK: supple, thyroid is not enlarged CHEST WALL: no deformity LUNGS:  Clear  to auscultation CV: reg rate and rhythm, no murmur ABD: abdomen is soft, nontender.  no hepatosplenomegaly.  not distended.  no hernia.   MUSCULOSKELETAL: muscle bulk and strength are grossly normal.  no obvious joint swelling.  Gait is not tested EXTEMITIES: no deformity.  no ulcer on the feet.  feet are of normal color and temp.  no edema PULSES: dorsalis pedis intact bilat.  no carotid bruit NEURO:  cn 2-12 grossly intact.   readily moves all 4's.  sensation is intact to touch on the feet SKIN:  Normal texture and temperature.  No rash or suspicious lesion is visible.   NODES:  None palpable at the neck PSYCH: alert, oriented x3.  Does not appear anxious nor depressed.  Lab Results  Component Value Date   HGBA1C 9.6* 07/24/2012      Assessment & Plan:  DM: she needs increased rx Edema: this limits oral options Weight loss.  This helps the control of DM.

## 2012-08-05 ENCOUNTER — Other Ambulatory Visit: Payer: Self-pay | Admitting: Neurology

## 2012-08-11 ENCOUNTER — Telehealth: Payer: Self-pay | Admitting: Family Medicine

## 2012-08-11 DIAGNOSIS — Z1389 Encounter for screening for other disorder: Secondary | ICD-10-CM

## 2012-08-11 NOTE — Telephone Encounter (Signed)
Msg left to call the office     KP 

## 2012-08-11 NOTE — Telephone Encounter (Signed)
Patient is asking that Dr. Etter Sjogren refer her to a Dermatologist. States that Dr. Etter Sjogren advised her to call and request her to do so if the medication she was taking for her issue did not work.

## 2012-08-11 NOTE — Telephone Encounter (Signed)
Is she talking about eye issue?--- refer to [facility] to refer to derm for skin check

## 2012-08-11 NOTE — Telephone Encounter (Signed)
Please advise 

## 2012-08-18 ENCOUNTER — Other Ambulatory Visit: Payer: Self-pay | Admitting: *Deleted

## 2012-08-18 MED ORDER — FREESTYLE LANCETS MISC: Status: DC

## 2012-08-18 MED ORDER — GLUCOSE BLOOD VI STRP: ORAL_STRIP | Status: DC

## 2012-08-18 NOTE — Telephone Encounter (Signed)
Rx sent 

## 2012-08-22 ENCOUNTER — Telehealth: Payer: Self-pay | Admitting: Family Medicine

## 2012-08-22 ENCOUNTER — Telehealth: Payer: Self-pay | Admitting: Nurse Practitioner

## 2012-08-22 NOTE — Telephone Encounter (Signed)
Patient states that she is calling to see about why she was referred to a kidney doctor.

## 2012-08-24 ENCOUNTER — Encounter: Payer: Self-pay | Admitting: Family Medicine

## 2012-08-25 NOTE — Telephone Encounter (Signed)
My-chart message sent      KP

## 2012-08-25 NOTE — Telephone Encounter (Signed)
I called and spoke to pt and her pcp, Dr. Etter Sjogren / Bernestine Amass PA made this referral.

## 2012-08-26 ENCOUNTER — Encounter: Payer: Self-pay | Admitting: Family Medicine

## 2012-08-26 ENCOUNTER — Other Ambulatory Visit: Payer: Self-pay

## 2012-08-26 MED ORDER — ATORVASTATIN CALCIUM 10 MG PO TABS: 10.0000 mg | ORAL_TABLET | Freq: Every day | ORAL | Status: DC

## 2012-08-28 ENCOUNTER — Telehealth: Payer: Self-pay | Admitting: Family Medicine

## 2012-08-28 ENCOUNTER — Encounter: Payer: Self-pay | Admitting: Family Medicine

## 2012-08-28 ENCOUNTER — Encounter: Payer: Self-pay | Admitting: Neurology

## 2012-08-28 DIAGNOSIS — I10 Essential (primary) hypertension: Secondary | ICD-10-CM

## 2012-08-28 DIAGNOSIS — E785 Hyperlipidemia, unspecified: Secondary | ICD-10-CM

## 2012-08-28 NOTE — Telephone Encounter (Signed)
Pt made aware no samples available.

## 2012-08-28 NOTE — Telephone Encounter (Signed)
Pt called to see if she could get samples of Lancets(freestyle)dx250.00 and glucose strips because pharmacy did not have them. thanks

## 2012-08-29 ENCOUNTER — Encounter: Payer: Self-pay | Admitting: Family Medicine

## 2012-08-29 MED ORDER — LISINOPRIL 10 MG PO TABS: 10.0000 mg | ORAL_TABLET | Freq: Every evening | ORAL | Status: DC

## 2012-08-29 MED ORDER — EZETIMIBE 10 MG PO TABS: 10.0000 mg | ORAL_TABLET | Freq: Every day | ORAL | Status: DC

## 2012-08-29 MED ORDER — METFORMIN HCL 1000 MG PO TABS: 1000.0000 mg | ORAL_TABLET | Freq: Two times a day (BID) | ORAL | Status: DC

## 2012-08-29 MED ORDER — COLESEVELAM HCL 625 MG PO TABS: 1875.0000 mg | ORAL_TABLET | Freq: Two times a day (BID) | ORAL | Status: DC

## 2012-09-03 DIAGNOSIS — D0460 Carcinoma in situ of skin of unspecified upper limb, including shoulder: Secondary | ICD-10-CM | POA: Diagnosis not present

## 2012-09-03 DIAGNOSIS — L570 Actinic keratosis: Secondary | ICD-10-CM | POA: Diagnosis not present

## 2012-09-03 DIAGNOSIS — I781 Nevus, non-neoplastic: Secondary | ICD-10-CM | POA: Diagnosis not present

## 2012-09-03 DIAGNOSIS — D235 Other benign neoplasm of skin of trunk: Secondary | ICD-10-CM | POA: Diagnosis not present

## 2012-09-05 ENCOUNTER — Telehealth: Payer: Self-pay | Admitting: Neurology

## 2012-09-05 DIAGNOSIS — G6181 Chronic inflammatory demyelinating polyneuritis: Secondary | ICD-10-CM

## 2012-09-05 MED ORDER — GABAPENTIN 300 MG PO CAPS: 300.0000 mg | ORAL_CAPSULE | Freq: Four times a day (QID) | ORAL | Status: DC

## 2012-09-05 MED ORDER — PROMETHAZINE HCL 25 MG PO TABS: 25.0000 mg | ORAL_TABLET | ORAL | Status: DC | PRN

## 2012-09-05 MED ORDER — TRAMADOL HCL 50 MG PO TABS: 50.0000 mg | ORAL_TABLET | Freq: Two times a day (BID) | ORAL | Status: DC

## 2012-09-05 MED ORDER — AMITRIPTYLINE HCL 10 MG PO TABS: 30.0000 mg | ORAL_TABLET | Freq: Every day | ORAL | Status: DC

## 2012-09-05 NOTE — Telephone Encounter (Signed)
Refills

## 2012-09-08 ENCOUNTER — Telehealth: Payer: Self-pay | Admitting: Family Medicine

## 2012-09-08 NOTE — Telephone Encounter (Addendum)
Patient states her medications are very expensive, she is inquiring if there may be a drug assistance program to help with the cost.  If no assistance, she is inquiring about samples.

## 2012-09-09 DIAGNOSIS — R809 Proteinuria, unspecified: Secondary | ICD-10-CM | POA: Diagnosis not present

## 2012-09-09 NOTE — Addendum Note (Signed)
Addended by: Modena Morrow D on: 09/09/2012 04:10 PM   Modules accepted: Orders

## 2012-09-10 ENCOUNTER — Encounter: Payer: Self-pay | Admitting: Family Medicine

## 2012-09-10 NOTE — Telephone Encounter (Signed)
Left message on pts voice mail stating that application for Patient Assistance will be placed up front.

## 2012-09-11 NOTE — Telephone Encounter (Signed)
Please advise on any recommendations thanks.

## 2012-09-12 DIAGNOSIS — R809 Proteinuria, unspecified: Secondary | ICD-10-CM | POA: Diagnosis not present

## 2012-09-22 ENCOUNTER — Telehealth: Payer: Self-pay | Admitting: Neurology

## 2012-09-24 ENCOUNTER — Telehealth: Payer: Self-pay | Admitting: *Deleted

## 2012-09-24 ENCOUNTER — Encounter: Payer: Self-pay | Admitting: Family Medicine

## 2012-09-24 MED ORDER — METFORMIN HCL 1000 MG PO TABS: 1000.0000 mg | ORAL_TABLET | Freq: Two times a day (BID) | ORAL | Status: DC

## 2012-09-24 NOTE — Telephone Encounter (Signed)
Patient left message that her new prescription for Tramadol is now take 50mg  two times a day at 10am and 5pm.  The previous RX said to take 100mg  twice a day as needed.  She was taking (4) 50mg  tabs a day, two in the morning and 2 at bedtime.  She wants clarification on how she is suppose to take Tramadol.

## 2012-09-24 NOTE — Telephone Encounter (Signed)
Pt called and stated she switched pharmacy. Would like her metformin to go to [facility]. Please advise.  SW//CMA

## 2012-09-25 NOTE — Telephone Encounter (Signed)
This entire this patient is able to tolerate 50 mg twice a day for pain control or if this is insufficient. Is insufficient I would like to refill 50 either 3 times a day or return to 100 mg twice a day.

## 2012-09-29 ENCOUNTER — Encounter: Payer: Self-pay | Admitting: Family Medicine

## 2012-09-30 NOTE — Telephone Encounter (Signed)
Left message for patient to see if the Tramadol 50 mg was sufficient to take care of her pain and told her the doctor would increase dose if needed.   I have not heard back from patient.

## 2012-10-24 DIAGNOSIS — H524 Presbyopia: Secondary | ICD-10-CM | POA: Diagnosis not present

## 2012-10-24 DIAGNOSIS — H2510 Age-related nuclear cataract, unspecified eye: Secondary | ICD-10-CM | POA: Diagnosis not present

## 2012-10-24 DIAGNOSIS — E119 Type 2 diabetes mellitus without complications: Secondary | ICD-10-CM | POA: Diagnosis not present

## 2012-10-24 DIAGNOSIS — H538 Other visual disturbances: Secondary | ICD-10-CM | POA: Diagnosis not present

## 2012-10-31 ENCOUNTER — Ambulatory Visit: Payer: BC Managed Care – PPO | Admitting: Endocrinology

## 2012-11-10 ENCOUNTER — Encounter: Payer: Self-pay | Admitting: Family Medicine

## 2012-11-16 ENCOUNTER — Other Ambulatory Visit: Payer: Self-pay | Admitting: Neurology

## 2012-11-19 ENCOUNTER — Encounter: Payer: Self-pay | Admitting: Neurology

## 2012-11-19 ENCOUNTER — Encounter: Payer: Self-pay | Admitting: Family Medicine

## 2012-11-19 ENCOUNTER — Other Ambulatory Visit: Payer: Self-pay | Admitting: Family Medicine

## 2012-11-19 ENCOUNTER — Telehealth: Payer: Self-pay | Admitting: Neurology

## 2012-11-19 ENCOUNTER — Ambulatory Visit (INDEPENDENT_AMBULATORY_CARE_PROVIDER_SITE_OTHER): Payer: Medicare Other | Admitting: Neurology

## 2012-11-19 VITALS — BP 132/80 | HR 78 | Resp 18 | Wt 200.0 lb

## 2012-11-19 DIAGNOSIS — G6181 Chronic inflammatory demyelinating polyneuritis: Secondary | ICD-10-CM

## 2012-11-19 DIAGNOSIS — Z5181 Encounter for therapeutic drug level monitoring: Secondary | ICD-10-CM | POA: Diagnosis not present

## 2012-11-19 DIAGNOSIS — N289 Disorder of kidney and ureter, unspecified: Secondary | ICD-10-CM

## 2012-11-19 DIAGNOSIS — E669 Obesity, unspecified: Secondary | ICD-10-CM | POA: Insufficient documentation

## 2012-11-19 DIAGNOSIS — E1149 Type 2 diabetes mellitus with other diabetic neurological complication: Secondary | ICD-10-CM

## 2012-11-19 DIAGNOSIS — Z905 Acquired absence of kidney: Secondary | ICD-10-CM | POA: Insufficient documentation

## 2012-11-19 LAB — BASIC METABOLIC PANEL
GFR calc Af Amer: 76 mL/min/{1.73_m2} (ref >59)
GFR calc non Af Amer: 66 mL/min/{1.73_m2} (ref >59)
Glucose: 285 mg/dL — ABNORMAL HIGH (ref 65–99)
Potassium: 4.4 mmol/L (ref 3.5–5.2)
Sodium: 137 mmol/L (ref 134–144)

## 2012-11-19 MED ORDER — TRAMADOL HCL 50 MG PO TABS: 50.0000 mg | ORAL_TABLET | Freq: Two times a day (BID) | ORAL | Status: DC

## 2012-11-19 MED ORDER — PROMETHAZINE HCL 25 MG PO TABS: 25.0000 mg | ORAL_TABLET | ORAL | Status: DC | PRN

## 2012-11-19 NOTE — Addendum Note (Signed)
Addended by: [name] on: 11/19/2012 12:12 PM   Modules accepted: Orders

## 2012-11-19 NOTE — Progress Notes (Signed)
Quick Note:  Please call . Patient has poorly controlled diabetes, but her creatinine is currnetly in normal range. I will await Dr.Powells response to my question about iGA subcutaneous treatment.for CIDP >CD ______

## 2012-11-19 NOTE — Progress Notes (Signed)
[facility]  Provider:  [name], M D  Referring Provider: Rosalita Chessman, DO Primary Care Physician:  Garnet Koyanagi, DO  No chief complaint on file.   HPI:  Nicole Strong is a 62 y.o. 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 Patient ID: Nicole Strong, female DOB: 11/23/1950, 62 y.o. 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 Monokidney, lost one kidney to  renal cancer in 2011. Prednisone caused mania , euphoria, severe. IV IG was not considered an option IV due to the burden on the kidney.  The patient has been seen last by me and November 2013 and in April of this year was seen by Windle Guard. She has rather severe residual symptoms and had 5 plasmapheresis treatments but felt no improvement for her symptoms after about. (Had ascended to the upper extremities , milder breathing difficulties she had some little shortness of breath at the time ) but  Milder upper extremity weakness. She had lost deep tendon reflexes throughout her 4 extremities. She have to recover in a rehabilitation  Home  and now is back since 07-2010  in her private home without in-home care.  The course from acute IDP to CIDP was 8 month in duration , 2011.  A total of 5 plasmapheresis treatments was applied, 3 times . Last in March 2014 .  The patient reports that she can take a couple of steps which is an improvement over April when she could stand but could not walk unassisted at all. While she is spending time in her wheelchair she is able to transfer to the toilet, bad and dining chair. She also can shower now results of systems sitting on a shower bench.  Based on this I think that there were some plasmapheresis results that are beneficial the problem will be to place the catheter and undergone the treatment again. I have offered her today and alternatives that might maybe  kidney friendly in comparison to IVIG this a subcutaneous immunoglobulin it is more global in a and a 20% liquid concentration injected subcutaneously.  Patient's wears CIDP can use it weekly and he can follow her kidney function with the help of Dr. Harvie Bridge (urologist)  and Erling Cruz, her new nephrologist.     She has never been on insulin. pt says his diet is "fair," and exercise is limited by health problems. Pt reports few years of slight swelling of the legs, but no assoc sob. She did not tolerate januvia (diarrhea).   Review of Systems: Out of a complete 14 system review, the patient complains of only the following symptoms, and all other reviewed systems are negative. CIDP, generalized weakness , lower extremity more than upper. Attenuated reflexes.  Weight gain,  Skin rash and lesion.     History   Social History  . Marital Status: Divorced    Spouse Name: N/A    Number of Children: N/A  . Years of Education: N/A   Occupational History  . Not on file.   Social History Main Topics  . Smoking status: Former Research scientist (life sciences)  . Smokeless tobacco: Never Used  . Alcohol Use: No  . Drug Use: No  . Sexual Activity: Not on file   Other Topics Concern  . Not on file   Social History Narrative  . No narrative on file  Family History  Problem Relation Age of Onset  . Cancer Father     PROSTATE  . Cancer Sister     BREAST  . Healthy Mother     Past Medical History  Diagnosis Date  . Diabetes mellitus     2  . Hyperlipidemia   . Hypertension   . Renal mass     BEING WORKED UP BY UROLOGY  . Chronic inflammatory demyelinating polyneuritis   . Guillain-Barre syndrome   . Renal disorder     L kidney removed  . Cancer     Past Surgical History  Procedure Laterality Date  . Cesarean section    . Cholecystectomy    . Nephrectomy      Current Outpatient Prescriptions  Medication Sig Dispense Refill  . amitriptyline (ELAVIL) 10 MG tablet Take 3 tablets (30 mg  total) by mouth at bedtime.  90 tablet  5  . atorvastatin (LIPITOR) 10 MG tablet Take 1 tablet (10 mg total) by mouth daily.  30 tablet  2  . calcium carbonate (OS-CAL) 600 MG TABS Take 600 mg by mouth every morning.       . cephALEXin (KEFLEX) 500 MG capsule Take 1 capsule (500 mg total) by mouth 2 (two) times daily.  20 capsule  0  . Cholecalciferol (VITAMIN D-3) 1000 UNITS CAPS Take 5,000 Units by mouth daily.      . COD LIVER OIL PO Take 1 capsule by mouth daily.      . colesevelam (WELCHOL) 625 MG tablet Take 3 tablets (1,875 mg total) by mouth 2 (two) times daily with a meal.  180 tablet  1  . diphenoxylate-atropine (LOMOTIL) 2.5-0.025 MG per tablet Take 1-2 tablets by mouth 4 (four) times daily as needed for diarrhea or loose stools.  30 tablet  0  . ezetimibe (ZETIA) 10 MG tablet Take 1 tablet (10 mg total) by mouth at bedtime.  90 tablet  1  . gabapentin (NEURONTIN) 300 MG capsule Take 1 capsule (300 mg total) by mouth 4 (four) times daily.  120 capsule  5  . glucose blood test strip Freestyle lite test strip Test once a day Dx 250.00  100 each  5  . Lancets (FREESTYLE) lancets Test once a day Dx 250.00  100 each  5  . lisinopril (PRINIVIL,ZESTRIL) 10 MG tablet Take 1 tablet (10 mg total) by mouth every evening.  90 tablet  1  . metFORMIN (GLUCOPHAGE) 1000 MG tablet Take 1 tablet (1,000 mg total) by mouth 2 (two) times daily with a meal.  180 tablet  1  . moxifloxacin (VIGAMOX) 0.5 % ophthalmic solution Place 1 drop into both eyes 3 (three) times daily.  3 mL  0  . nateglinide (STARLIX) 120 MG tablet Take 1 tablet (120 mg total) by mouth 3 (three) times daily before meals.  90 tablet  11  . promethazine (PHENERGAN) 25 MG tablet Take 1 tablet (25 mg total) by mouth as needed for nausea.  30 tablet  0  . traMADol (ULTRAM) 50 MG tablet Take 1 tablet (50 mg total) by mouth 2 (two) times daily at 10 AM and 5 PM.  60 tablet  1   No current facility-administered medications for this visit.     Allergies as of 11/19/2012 - Review Complete 08/01/2012  Allergen Reaction Noted  . Biaxin [clarithromycin] Other (See Comments) 05/31/2012  . Codeine Nausea And Vomiting 10/19/2009  . Erythromycin Nausea And Vomiting 06/17/2012  . Hydrocodone-acetaminophen Other (See Comments)  10/19/2009    Vitals: There were no vitals taken for this visit. Last Weight:  Wt Readings from Last 1 Encounters:  08/01/12 204 lb (92.534 kg)   Last Height:   Ht Readings from Last 1 Encounters:  08/01/12 5\' 1"  (1.549 m)    Physical exam:  General: The patient is awake, alert and appears not in acute distress. The patient is well groomed. Head: Normocephalic, atraumatic. Neck is supple. Mallampati 3, neck circumference: 16 inches . Cardiovascular:  Regular rate and rhythm , without  murmurs or carotid bruit, and without distended neck veins. Respiratory: Lungs are clear to auscultation. Skin:  Without evidence of edema, or rash Trunk: BMI remains elevated. This  patient  has  A hunched abnormal posture.  Neurologic exam : The patient is awake and alert, oriented to place and time.  Memory subjective  described as intact. There is a normal attention span & concentration ability.  Speech is fluent without dysarthria, dysphonia or aphasia. Mood and affect are depressed. Cranial nerves: Pupils are equal and briskly reactive to light. Funduscopic exam with  evidence of beginning cataract, not  pallor, not  Edema -  diplopia reported, both eyes have nystagmus - this is a righting reflex , not a CNS steering defect.  Oculomotor weakness when looking in the horizontal plane,  abducen Paresis. Out ward bound vision created horizontal diplopia. .   Extraocular movements  in vertical and horizontal planes intact and without nystagmus.  Visual fields by finger perimetry are intact. Hearing to finger rub intact.  Facial sensation intact to fine touch. Facial motor strength is symmetric and tongue and uvula move  midline.  Motor exam:    Normal tone and normal muscle bulk,symmetric in upper extremities.  Weakness of grip right over left, no atrophy of the thenar eminence or proximal muscle.  She is unable to provide resistance against hip flexion, adduction and abduction, knee flexion.   Sensory:  Fine touch, pinprick and vibration were tested in all extremities. These are absent in lower extremities to mid-calf level.   Proprioception is normal in both hands. It is absent in both feet and impairs her gait further.   Coordination: Rapid alternating movements in the fingers/hands is tested and normal.  Finger-to-nose maneuver tested and normal without evidence of ataxia, dysmetria or tremor.  Gait and station: Patient walks with  assistive device and is able to push her wheelchair . Strength very  limited.  Stance is instable and she leans on objects to stabilize- Tandem gait is deferred.  Deep tendon reflexes: in the  upper and lower extremities are attenuated .Babinski maneuver response is equivocal , downgoing.   Assessment:  After physical and neurologic examination, review of laboratory studies, imaging, neurophysiology testing and pre-existing records, assessment is  A patient with history of guillain Barre syndrome and incomplete recover of sensor and motor function, remaining  Incompletely teatraplegic and  Lacking  Propioception. Remaining gait impaired , but has had little step by step  recovery along the way.  Repeat plasmapheresis.  Order   IG GLOBULIN A  In form of HIZENTRA subcutaneusly , if  Dr Florene Glen finds her renal function stable enough.  [facility] will await his response.       Plan:  Treatment plan and additional workup :

## 2012-11-19 NOTE — Patient Instructions (Signed)
Chronic Inflammatory Demyelinating Polyneuropathy Chronic inflammatory demyelinating polyneuropathy (CIDP) is a condition in which swelling and damage to nerves result in impaired sensation and/or movement. CAUSES   CIDP is caused when the covering of nerves (myelin sheath) becomes damaged.  In the most common form of the disorder, the damage is believed to occur because the immune system (cells that protect the body against foreign invaders, like bacteria and viruses) accidentally attacks the myelin.  CIDP may accompany conditions such as blood disorders (Waldenstrom's macroglobulinemia, multiple myeloma, or osteosclerotic myeloma), diabetes, HIV, or hepatitis B or C infection. SYMPTOMS  Symptoms may include:  Numbness or tingling of the hands and feet.  Weakness.  Problems walking.  Weak or absent reflexes. DIAGNOSIS   Tests are done that show how well the nerves are working. These tests include:  Nerve conduction studies.  Electromyography.  A nerve biopsy may be done to remove a small piece of a nerve for examination. TREATMENT  Treatment may include:  Steroids or other immunosuppressant drugs. These can decrease swelling and dampen down the immune system.  IVIG therapy. This decreases the immune system's activity.  Blood plasma exchange. This can be done to remove harmful immune system elements from the blood. Behavior/lifestyle changes can include:  Physical therapy. This may help improve symptoms.  Braces or orthotics. These may help support weakened limbs. HOME CARE INSTRUCTIONS   Take all your medicines as directed by your caregiver.  Follow through on physical therapy recommendations. SEEK IMMEDIATE MEDICAL CARE IF:  You notice new symptoms.  You notice your symptoms worsening.  You develop severe pain. Document Released: 10/21/2001 Document Revised: 04/23/2011 Document Reviewed: 05/10/2008 [facility] Patient Information 2014 [place].

## 2012-11-20 NOTE — Telephone Encounter (Signed)
Refill request

## 2012-11-21 ENCOUNTER — Telehealth: Payer: Self-pay

## 2012-11-21 NOTE — Telephone Encounter (Signed)
I called [facility] and spoke with her about Dr. Allie Bossier' findings that were, patient has poorly controlled diabetes, but her creatinine is currnetly in normal range. Dr. Brett Fairy is waiting for a response from Dr. Ulice Bold to her question about iGA subcutaneous treatment.for CIDP >CD.  Patient states that she did not know she was having fasting blood work and had just eaten prior to her last office visit. We reviewed her BG level over time and it has improved. Also, patient has recently changed medications for her diabetes and will see how that does for her.  Patient thanked [place] for the information.

## 2012-11-21 NOTE — Telephone Encounter (Signed)
Message copied by [facility] on Fri Nov 21, 2012  3:34 PM ------      Message from: Brett Fairy, CARMEN      Created: Wed Nov 19, 2012  6:24 PM       Please call . Patient has poorly controlled diabetes, but her creatinine is currnetly in normal range. I will await Dr.Powells response to my question about  iGA subcutaneous treatment.for CIDP >CD ------

## 2012-11-21 NOTE — Telephone Encounter (Signed)
Done

## 2012-11-24 ENCOUNTER — Encounter: Payer: Self-pay | Admitting: Endocrinology

## 2012-11-25 ENCOUNTER — Telehealth: Payer: Self-pay | Admitting: Neurology

## 2012-11-25 NOTE — Telephone Encounter (Signed)
Betsy from [facility] left message that there are no contraindications for the Hizentra with patient having one kidney.  The dose and frequency are standard.  Let me know when you want to go forward.

## 2012-11-26 ENCOUNTER — Ambulatory Visit: Payer: Medicare Other | Admitting: Endocrinology

## 2012-12-03 ENCOUNTER — Ambulatory Visit (HOSPITAL_COMMUNITY)
Admission: RE | Admit: 2012-12-03 | Discharge: 2012-12-03 | Disposition: A | Discharge status: Home / Self Care | Payer: Medicare Other | Source: Ambulatory Visit | Attending: Urology | Admitting: Urology

## 2012-12-03 ENCOUNTER — Other Ambulatory Visit (HOSPITAL_COMMUNITY): Payer: Self-pay | Admitting: Urology

## 2012-12-03 DIAGNOSIS — C649 Malignant neoplasm of unspecified kidney, except renal pelvis: Secondary | ICD-10-CM | POA: Insufficient documentation

## 2012-12-17 ENCOUNTER — Other Ambulatory Visit: Payer: Self-pay | Admitting: Neurology

## 2012-12-18 ENCOUNTER — Other Ambulatory Visit: Payer: Self-pay

## 2012-12-18 DIAGNOSIS — C679 Malignant neoplasm of bladder, unspecified: Secondary | ICD-10-CM | POA: Diagnosis not present

## 2012-12-18 DIAGNOSIS — Z8559 Personal history of malignant neoplasm of other urinary tract organ: Secondary | ICD-10-CM | POA: Diagnosis not present

## 2013-01-01 ENCOUNTER — Encounter: Payer: Self-pay | Admitting: Neurology

## 2013-01-01 ENCOUNTER — Other Ambulatory Visit: Payer: Self-pay | Admitting: Family Medicine

## 2013-01-02 ENCOUNTER — Telehealth: Payer: Self-pay | Admitting: Neurology

## 2013-01-02 DIAGNOSIS — G6181 Chronic inflammatory demyelinating polyneuritis: Secondary | ICD-10-CM

## 2013-01-02 DIAGNOSIS — E1149 Type 2 diabetes mellitus with other diabetic neurological complication: Secondary | ICD-10-CM

## 2013-01-02 DIAGNOSIS — Z5181 Encounter for therapeutic drug level monitoring: Secondary | ICD-10-CM

## 2013-01-02 MED ORDER — TRAMADOL HCL 50 MG PO TABS: 50.0000 mg | ORAL_TABLET | Freq: Three times a day (TID) | ORAL | Status: DC | PRN

## 2013-01-02 NOTE — Telephone Encounter (Signed)
This is a patient followed by Dr. Brett Fairy, I have not seen the patient yet. I'll refer this decision to Dr. Brett Fairy.

## 2013-01-02 NOTE — Telephone Encounter (Signed)
It appears this Rx was written on 10/8 with 5 refills.  I called the patient.  She said the directions say to take Tramadol one BID, but she would like that increased to one TID.  Okay to update Rx ?  Please advise.  Thank you.

## 2013-01-02 NOTE — Telephone Encounter (Signed)
Increased to prn tid use. Fax prescription, please. CD

## 2013-01-02 NOTE — Telephone Encounter (Signed)
I had a long conversation with the patient about starting Hizentra, she wanted to be sure the doctor had heard from Dr. Florene Glen that it was OK to proceed.  I could not find documentation in the chart where Dr. Florene Glen gave the confirmation that she could receive the medication.  There is an office note dated 09-12-12 from Dr. Florene Glen which is before the Hizentra was talked about.   Patient also wants Dr. Florene Glen to be aware that she has never used IVIG before.  She is hesitant to move forward without speaking to doctor.    Patient had also sent an e-mail saying that Dr. Alinda Money was leaving decision with Dr. Florene Glen.

## 2013-01-07 NOTE — Telephone Encounter (Signed)
Left message for patient relaying that Dr. Brett Fairy has tried to reach Dr. Florene Glen and has not heard back.  I told her the doctor would like her to proceed, and its up to her to contact Dr. Florene Glen for a follow up but to let us know what her decision is.

## 2013-01-07 NOTE — Telephone Encounter (Signed)
I have left 2 epic messages and 2 phone calls with dr Florene Glen- no answer so far. I would like to go ahead.

## 2013-01-07 NOTE — Telephone Encounter (Signed)
Hallo Nicole Strong,  I have not gotten an answer on Epic form Dr Florene Glen . I feel  Good about initiating it , after talking to another nephrologist about this medication in general when used in patients with one kidney- again  in general - not strictly about you. Lets start.   Jamie Belger, MD  .

## 2013-01-19 ENCOUNTER — Telehealth: Payer: Self-pay | Admitting: Neurology

## 2013-01-19 ENCOUNTER — Other Ambulatory Visit: Payer: Self-pay | Admitting: Neurology

## 2013-01-19 DIAGNOSIS — R809 Proteinuria, unspecified: Secondary | ICD-10-CM | POA: Diagnosis not present

## 2013-01-19 DIAGNOSIS — I10 Essential (primary) hypertension: Secondary | ICD-10-CM | POA: Diagnosis not present

## 2013-01-19 DIAGNOSIS — E669 Obesity, unspecified: Secondary | ICD-10-CM | POA: Diagnosis not present

## 2013-01-19 DIAGNOSIS — E119 Type 2 diabetes mellitus without complications: Secondary | ICD-10-CM | POA: Diagnosis not present

## 2013-01-19 DIAGNOSIS — Z0289 Encounter for other administrative examinations: Secondary | ICD-10-CM

## 2013-01-19 NOTE — Telephone Encounter (Signed)
[facility] left message that the patient's insurance has denied coverage for Hizentra order based on "no indications for being used for FDA for neurology."  They will try again in January because she is suppose to have a new insurance.  The patient wanted doctor to be aware.

## 2013-01-20 NOTE — Telephone Encounter (Signed)
Rx was previously sent to [facility]

## 2013-01-24 ENCOUNTER — Encounter: Payer: Self-pay | Admitting: Neurology

## 2013-01-27 NOTE — Telephone Encounter (Signed)
Left pt VM, forms have been returned with signature. I do not have a form for her parking placard. Please let me know if that was returned to you. Otherwise, I will see if I can print one off the internet in the morning.

## 2013-01-28 ENCOUNTER — Telehealth: Payer: Self-pay

## 2013-01-28 NOTE — Telephone Encounter (Signed)
Forms faxed

## 2013-02-24 ENCOUNTER — Telehealth: Payer: Self-pay | Admitting: Neurology

## 2013-02-24 NOTE — Telephone Encounter (Addendum)
Spoke with Nicole Strong and she said that the patient's question is whether she should return at all , called the patient , no answer, left VM message for more clarification

## 2013-02-24 NOTE — Telephone Encounter (Signed)
Patient calling to ask whether appointment tomorrow 02/25/13 with Cecille Rubin is necessary. Please call the patient back.

## 2013-02-25 ENCOUNTER — Ambulatory Visit: Payer: Medicare Other | Admitting: Nurse Practitioner

## 2013-02-25 NOTE — Telephone Encounter (Signed)
Spoke with patient and she said that she is find with returning for an appt, has scheduled w/ Ms Hassell Done in April, could not make today's appt because of the weather

## 2013-03-30 DIAGNOSIS — M76899 Other specified enthesopathies of unspecified lower limb, excluding foot: Secondary | ICD-10-CM | POA: Diagnosis not present

## 2013-03-30 DIAGNOSIS — M25559 Pain in unspecified hip: Secondary | ICD-10-CM | POA: Diagnosis not present

## 2013-04-14 DIAGNOSIS — M25559 Pain in unspecified hip: Secondary | ICD-10-CM | POA: Diagnosis not present

## 2013-04-14 DIAGNOSIS — M76899 Other specified enthesopathies of unspecified lower limb, excluding foot: Secondary | ICD-10-CM | POA: Diagnosis not present

## 2013-04-17 ENCOUNTER — Other Ambulatory Visit: Payer: Self-pay | Admitting: Neurology

## 2013-04-21 ENCOUNTER — Other Ambulatory Visit: Payer: Self-pay | Admitting: Neurology

## 2013-04-27 ENCOUNTER — Other Ambulatory Visit: Payer: Self-pay | Admitting: Neurology

## 2013-04-29 ENCOUNTER — Other Ambulatory Visit: Payer: Self-pay | Admitting: Neurology

## 2013-05-05 ENCOUNTER — Encounter: Payer: Self-pay | Admitting: Neurology

## 2013-05-05 ENCOUNTER — Ambulatory Visit: Payer: Medicare Other | Admitting: Neurology

## 2013-05-19 ENCOUNTER — Telehealth: Payer: Self-pay | Admitting: Neurology

## 2013-05-19 DIAGNOSIS — G6181 Chronic inflammatory demyelinating polyneuritis: Secondary | ICD-10-CM

## 2013-05-19 NOTE — Telephone Encounter (Signed)
Dr. Brett Fairy this is what I found out online. The SENSUS Pain Management System is a transcutaneous electrical nerve stimulator (TENS unit) designed for relief of chronic pain in the lower legs and feet. It is worn on the leg, just below the knee, and is activated by simply pressing a button.  Most TENS units are done by PT and I believe she will have to be referred to PT.  I will call [facility] and ask.

## 2013-05-19 NOTE — Telephone Encounter (Signed)
This patient asked for a sensus unit- i am not sure if she meant a TENSE unit for pain? Can triage or RN  find out where and how she could get this. Crecencio Kwiatek.

## 2013-05-20 NOTE — Telephone Encounter (Signed)
Spoke to a PT from [facility] and they use a specific company for ordering the TENS units.  The patient would need a referral to Physical Therapy for a TENS unit.

## 2013-05-21 NOTE — Telephone Encounter (Signed)
Spoke to patient and relayed the process of getting a TENS unit.  I explained that [facility] will call her for an appointment with a PT and they will fit her for the unit, but they use a specific vendor for ordering the units.

## 2013-05-29 ENCOUNTER — Ambulatory Visit: Payer: Medicare Other | Attending: Neurology | Admitting: Physical Therapy

## 2013-06-10 DIAGNOSIS — C679 Malignant neoplasm of bladder, unspecified: Secondary | ICD-10-CM | POA: Diagnosis not present

## 2013-06-11 ENCOUNTER — Encounter: Payer: Self-pay | Admitting: Nurse Practitioner

## 2013-06-11 ENCOUNTER — Ambulatory Visit (INDEPENDENT_AMBULATORY_CARE_PROVIDER_SITE_OTHER): Payer: Medicare Other | Admitting: Nurse Practitioner

## 2013-06-11 VITALS — BP 158/84 | HR 82

## 2013-06-11 DIAGNOSIS — G589 Mononeuropathy, unspecified: Secondary | ICD-10-CM | POA: Diagnosis not present

## 2013-06-11 DIAGNOSIS — G6181 Chronic inflammatory demyelinating polyneuritis: Secondary | ICD-10-CM

## 2013-06-11 MED ORDER — AMITRIPTYLINE HCL 10 MG PO TABS: ORAL_TABLET | ORAL | Status: DC

## 2013-06-11 NOTE — Progress Notes (Signed)
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 She was last seen in this office by Dr. Dohmeier10/8/14. At that time she was told about a subcutaneous injection Hizentra but Medicare denied this. She complains of leg pain and has ordered a TENS unit from [employer]. She is not sure how to use the unit. She had previously been asked to go to physical therapy. She takes Neurontin for pain as well as amitriptyline at night. She is asking for an increase in her amitriptyline. She is seated in a rolling walker today. She also has a regular walker that she uses in her home. She is living in a prone able to transfer to the toilet bed and in the dining room chair she also shower bench for showering. She can ambulate short distances. 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 Monokidney, lost one kidney to renal cancer in 2011. Prednisone caused mania , euphoria, severe. IV IG was not considered an option IV due to the burden on the kidney.  The patient has been seen last by me and November 2013 and in April of this year was seen by Windle Guard. She has rather severe residual symptoms and had 5 plasmapheresis treatments but felt no improvement for her symptoms after about. (Had ascended to the upper extremities , milder breathing difficulties she had some little shortness of breath at the time but Milder upper extremity weakness. She had lost deep tendon reflexes throughout her 4 extremities. She have to recover in a rehabilitation Home and now is back since 07-2010 in her private home without in-home care.  The course from acute IDP  to CIDP was 8 month in duration , 2011.  A total of 5 plasmapheresis treatments was applied, 3 times . Last in March 2014 .  The patient reports that she can take a couple of steps which is an improvement over April when she could stand but could not walk unassisted at all. While she is spending time in her wheelchair she is able to transfer to the toilet, bad and dining chair. She also can shower now results of systems sitting on a shower bench.  Based on this I think that there were some plasmapheresis results that are beneficial the problem will be to place the catheter and undergone the treatment again. I have offered her today and alternatives that might maybe kidney friendly in comparison to IVIG this a subcutaneous immunoglobulin it is more global in a and a 20% liquid concentration injected subcutaneously.  Patient's wears CIDP can use it weekly and he can follow her kidney function with the help of Dr. Harvie Bridge (urologist) and Erling Cruz, her new nephrologist.  She has never been on insulin. pt says his diet is "fair," and exercise is limited by health problems. Pt reports few years of slight swelling of the legs, but no assoc sob. She did not tolerate januvia (diarrhea).      REVIEW OF SYSTEMS: Full 14 system review of systems performed and notable only for those listed, all others are neg:  Constitutional: Fatigue  Cardiovascular: N/A  Ear/Nose/Throat: N/A  Skin: N/A  Eyes:  N/A  Respiratory: N/A  Gastroitestinal: N/A  Hematology/Lymphatic: N/A  Endocrine: N/A Musculoskeletal: Back pain aching muscles  Allergy/Immunology: N/A  Neurological: Weakness Psychiatric: N/A Sleep insomnia   ALLERGIES: Allergies  Allergen Reactions  . Biaxin [Clarithromycin] Other (See Comments)    "face turns red"  . Codeine Nausea And Vomiting    Hallucinations  . Erythromycin Nausea And Vomiting  . Hydrocodone-Acetaminophen Other (See Comments)    "head feels funny"    HOME  MEDICATIONS: Outpatient Prescriptions Prior to Visit  Medication Sig Dispense Refill  . amitriptyline (ELAVIL) 10 MG tablet TAKE 3 TABLETS (30 MG TOTAL) BY MOUTH AT BEDTIME.  90 tablet  6  . calcium carbonate (OS-CAL) 600 MG TABS Take 600 mg by mouth every morning.       . Cholecalciferol (VITAMIN D-3) 1000 UNITS CAPS Take 5,000 Units by mouth daily.      . COD LIVER OIL PO Take 1 capsule by mouth daily.      . colesevelam (WELCHOL) 625 MG tablet Take 3 tablets (1,875 mg total) by mouth 2 (two) times daily with a meal.  180 tablet  1  . diphenoxylate-atropine (LOMOTIL) 2.5-0.025 MG per tablet Take 1-2 tablets by mouth 4 (four) times daily as needed for diarrhea or loose stools.  30 tablet  0  . ezetimibe (ZETIA) 10 MG tablet Take 1 tablet (10 mg total) by mouth at bedtime.  90 tablet  1  . gabapentin (NEURONTIN) 300 MG capsule TAKE 1 CAPSULE (300 MG TOTAL) BY MOUTH 4 (FOUR) TIMES DAILY.  120 capsule  6  . glucose blood test strip Freestyle lite test strip Test once a day Dx 250.00  100 each  5  . Lancets (FREESTYLE) lancets Test once a day Dx 250.00  100 each  5  . lisinopril (PRINIVIL,ZESTRIL) 10 MG tablet 1 tab by mouth daily--  Office visit due now  90 tablet  0  . metFORMIN (GLUCOPHAGE) 1000 MG tablet Take 1 tablet (1,000 mg total) by mouth 2 (two) times daily with a meal.  180 tablet  1  . moxifloxacin (VIGAMOX) 0.5 % ophthalmic solution Place 1 drop into both eyes 3 (three) times daily.  3 mL  0  . nateglinide (STARLIX) 120 MG tablet Take 1 tablet (120 mg total) by mouth 3 (three) times daily before meals.  90 tablet  11  . promethazine (PHENERGAN) 25 MG tablet Take 1 tablet (25 mg total) by mouth as needed for nausea.  90 tablet  3  . promethazine (PHENERGAN) 25 MG tablet take 1 tablet by mouth if needed for nausea  90 tablet  3  . traMADol (ULTRAM) 50 MG tablet Take 1 tablet (50 mg total) by mouth 3 (three) times daily as needed.  90 tablet  5  . atorvastatin (LIPITOR) 10 MG tablet Take 1  tablet (10 mg total) by mouth daily.  30 tablet  2   No facility-administered medications prior to visit.    PAST MEDICAL HISTORY: Past Medical History  Diagnosis Date  . Diabetes mellitus     2  . Hyperlipidemia   . Hypertension   . Renal mass     BEING WORKED UP BY UROLOGY  . Chronic inflammatory demyelinating polyneuritis   . Guillain-Barre syndrome   . Renal disorder     L kidney removed  . Cancer   . Single kidney     lost left kidney to cancer,     PAST SURGICAL HISTORY: Past Surgical History  Procedure  Laterality Date  . Cesarean section    . Cholecystectomy    . Nephrectomy      FAMILY HISTORY: Family History  Problem Relation Age of Onset  . Cancer Father     PROSTATE  . Cancer Sister     BREAST  . Healthy Mother     SOCIAL HISTORY: History   Social History  . Marital Status: Divorced    Spouse Name: N/A    Number of Children: 1  . Years of Education: 14   Occupational History  .      disabled   Social History Main Topics  . Smoking status: Former Research scientist (life sciences)  . Smokeless tobacco: Never Used  . Alcohol Use: No  . Drug Use: No  . Sexual Activity: Not on file   Other Topics Concern  . Not on file   Social History Narrative   Patient lives at home alone.    Disabled.   Education college education.   Right handed.   Caffeine. Rare one cup.     PHYSICAL EXAM  Filed Vitals:   06/11/13 1457  BP: 158/84  Pulse: 82   Cannot calculate BMI with a height equal to zero.  Generalized: Well developed, in no acute distress  Head: normocephalic and atraumatic,. Oropharynx benign  Neck: Supple, no carotid bruits  Cardiac: Regular rate rhythm, no murmur  Musculoskeletal: hunched posture Neurological examination   Mentation: Alert oriented to time, place, history taking. Follows all commands speech and language fluent  Cranial nerve II-XII: Pupils were equal round reactive to light extraocular movements were full, visual field were full on  confrontational test. Facial sensation and strength were normal. hearing was intact to finger rubbing bilaterally. Uvula tongue midline. head turning and shoulder shrug were normal and symmetric.Tongue protrusion into cheek strength was normal. Motor: normal bulk and tone, full strength in the BUE, 4/5 in both lower extremities  Sensory: Absent pinprick and vibratory to midcalf in the lower extremities, proprioception  abnormal in both feet  Coordination: finger-nose-finger,  no dysmetria Reflexes: Absent  Gait and Station: Rising up from seated position without assistance, wide based stance,  ambulated 70 feet behind rolling walker, slow steppage   DIAGNOSTIC DATA (LABS, IMAGING, TESTING) - I reviewed patient records, labs, notes, testing and imaging myself where available.      Component Value Date/Time   NA 137 11/19/2012 1312   NA 138 07/24/2012 1150   K 4.4 11/19/2012 1312   CL 100 11/19/2012 1312   CO2 27 11/19/2012 1312   GLUCOSE 285* 11/19/2012 1312   GLUCOSE 204* 07/24/2012 1150   BUN 19 11/19/2012 1312   BUN 19 07/24/2012 1150   CREATININE 0.92 11/19/2012 1312   CALCIUM 9.8 11/19/2012 1312   PROT 6.9 07/24/2012 1150   ALBUMIN 4.1 07/24/2012 1150   AST 18 07/24/2012 1150   ALT 22 07/24/2012 1150   ALKPHOS 75 07/24/2012 1150   BILITOT 0.5 07/24/2012 1150   GFRNONAA 66 11/19/2012 1312   GFRAA 76 11/19/2012 1312    Lab Results  Component Value Date   HGBA1C 9.6* 07/24/2012       ASSESSMENT AND PLAN  63 y.o. year old female  has a past medical history of Diabetes mellitus; Hyperlipidemia; Hypertension; Renal mass; Chronic inflammatory demyelinating polyneuritis; Guillain-Barre syndrome; Renal disorder; Cancer; and Single kidney. here to followup. Hizentra was denied by Medicare. Due to her kidney function she has limited options for treatment ie  IVIG not considered.  Increase nortriptyline to 4  tabs at HS for 1week then 5 at hs if necessary Continue Gabapentin at current dose Take  the TENS unit to neuro rehab for assistance F/U 8 months and prn Next appt with Dr. De Nurse, [facility], [facility], [facility] [address], [place], [place] [address] [phone]

## 2013-06-11 NOTE — Progress Notes (Signed)
I agree with the assessment and plan as directed by NP .The patient is known to me .   Arrian Manson, MD  

## 2013-06-11 NOTE — Patient Instructions (Signed)
Hizentra was denied by Medicare Increase nortriptyline to 4 tabs at HS for 1week then 5 at hs if necessary F/U 8 months and prn

## 2013-06-12 ENCOUNTER — Encounter: Payer: Self-pay | Admitting: Nurse Practitioner

## 2013-07-02 DIAGNOSIS — M79609 Pain in unspecified limb: Secondary | ICD-10-CM | POA: Diagnosis not present

## 2013-07-03 ENCOUNTER — Ambulatory Visit (HOSPITAL_COMMUNITY)
Admission: RE | Admit: 2013-07-03 | Discharge: 2013-07-03 | Disposition: A | Discharge status: Home / Self Care | Payer: Medicare Other | Source: Ambulatory Visit | Attending: Orthopedic Surgery | Admitting: Orthopedic Surgery

## 2013-07-03 ENCOUNTER — Other Ambulatory Visit (HOSPITAL_COMMUNITY): Payer: Self-pay | Admitting: Orthopedic Surgery

## 2013-07-03 ENCOUNTER — Other Ambulatory Visit: Payer: Self-pay | Admitting: Orthopedic Surgery

## 2013-07-03 DIAGNOSIS — E669 Obesity, unspecified: Secondary | ICD-10-CM

## 2013-07-03 DIAGNOSIS — R609 Edema, unspecified: Secondary | ICD-10-CM

## 2013-07-03 DIAGNOSIS — M25569 Pain in unspecified knee: Secondary | ICD-10-CM

## 2013-07-03 DIAGNOSIS — M79609 Pain in unspecified limb: Secondary | ICD-10-CM | POA: Diagnosis not present

## 2013-07-03 DIAGNOSIS — M7989 Other specified soft tissue disorders: Secondary | ICD-10-CM

## 2013-07-03 NOTE — Progress Notes (Signed)
Right lower extremity venous duplex completed.  Right:  No evidence of DVT, superficial thrombosis, or Baker's cyst.  Left:  Negative for DVT in the common femoral vein.  

## 2013-07-20 ENCOUNTER — Other Ambulatory Visit: Payer: Self-pay | Admitting: Neurology

## 2013-07-20 DIAGNOSIS — G6181 Chronic inflammatory demyelinating polyneuritis: Secondary | ICD-10-CM

## 2013-07-20 DIAGNOSIS — Z5181 Encounter for therapeutic drug level monitoring: Secondary | ICD-10-CM

## 2013-07-20 DIAGNOSIS — E1149 Type 2 diabetes mellitus with other diabetic neurological complication: Secondary | ICD-10-CM

## 2013-07-20 MED ORDER — TRAMADOL HCL 50 MG PO TABS: 50.0000 mg | ORAL_TABLET | Freq: Three times a day (TID) | ORAL | Status: DC | PRN

## 2013-07-22 DIAGNOSIS — M79609 Pain in unspecified limb: Secondary | ICD-10-CM | POA: Diagnosis not present

## 2013-08-13 ENCOUNTER — Inpatient Hospital Stay (HOSPITAL_COMMUNITY)
Admission: EM | Admit: 2013-08-13 | Discharge: 2013-08-15 | DRG: 372 | Disposition: A | Discharge status: Home / Self Care | Payer: Medicare Other | Attending: Internal Medicine | Admitting: Internal Medicine

## 2013-08-13 ENCOUNTER — Encounter (HOSPITAL_COMMUNITY): Payer: Self-pay | Admitting: Emergency Medicine

## 2013-08-13 DIAGNOSIS — Z905 Acquired absence of kidney: Secondary | ICD-10-CM | POA: Diagnosis not present

## 2013-08-13 DIAGNOSIS — R748 Abnormal levels of other serum enzymes: Secondary | ICD-10-CM | POA: Diagnosis not present

## 2013-08-13 DIAGNOSIS — A0472 Enterocolitis due to Clostridium difficile, not specified as recurrent: Secondary | ICD-10-CM | POA: Diagnosis present

## 2013-08-13 DIAGNOSIS — Z87891 Personal history of nicotine dependence: Secondary | ICD-10-CM | POA: Diagnosis not present

## 2013-08-13 DIAGNOSIS — Z85528 Personal history of other malignant neoplasm of kidney: Secondary | ICD-10-CM

## 2013-08-13 DIAGNOSIS — R112 Nausea with vomiting, unspecified: Secondary | ICD-10-CM | POA: Diagnosis not present

## 2013-08-13 DIAGNOSIS — Z9049 Acquired absence of other specified parts of digestive tract: Secondary | ICD-10-CM | POA: Diagnosis not present

## 2013-08-13 DIAGNOSIS — E119 Type 2 diabetes mellitus without complications: Secondary | ICD-10-CM | POA: Diagnosis present

## 2013-08-13 DIAGNOSIS — R197 Diarrhea, unspecified: Secondary | ICD-10-CM | POA: Diagnosis present

## 2013-08-13 DIAGNOSIS — N179 Acute kidney failure, unspecified: Secondary | ICD-10-CM | POA: Diagnosis present

## 2013-08-13 DIAGNOSIS — R7989 Other specified abnormal findings of blood chemistry: Secondary | ICD-10-CM

## 2013-08-13 DIAGNOSIS — G6181 Chronic inflammatory demyelinating polyneuritis: Secondary | ICD-10-CM | POA: Diagnosis present

## 2013-08-13 DIAGNOSIS — I10 Essential (primary) hypertension: Secondary | ICD-10-CM | POA: Diagnosis present

## 2013-08-13 DIAGNOSIS — R531 Weakness: Secondary | ICD-10-CM

## 2013-08-13 DIAGNOSIS — N178 Other acute kidney failure: Secondary | ICD-10-CM | POA: Diagnosis not present

## 2013-08-13 DIAGNOSIS — R109 Unspecified abdominal pain: Secondary | ICD-10-CM | POA: Diagnosis not present

## 2013-08-13 DIAGNOSIS — E860 Dehydration: Secondary | ICD-10-CM | POA: Diagnosis present

## 2013-08-13 DIAGNOSIS — E785 Hyperlipidemia, unspecified: Secondary | ICD-10-CM | POA: Diagnosis present

## 2013-08-13 NOTE — ED Notes (Signed)
Pt complains of stomach cramps on Monday, fatigued on Tuesday and diarrhea that started yesterday

## 2013-08-13 NOTE — ED Provider Notes (Signed)
CSN: [id number]     Arrival date & time 08/13/13  2245 History   First MD Initiated Contact with Patient 08/13/13 2335     Chief Complaint  Patient presents with  . Diarrhea     (Consider location/radiation/quality/duration/timing/severity/associated sxs/prior Treatment) The history is provided by the patient. No language interpreter was used.  Nicole Strong is a 63 y/o F with PMHX of diabetes, hyperlipidemia, hypertension, nephrectomy of left kidney in 2011, cholecystectomy, GBS presenting to the ED with abdominal pain, nausea, diarrhea. Patient reported that she did not start to feel like herself starting on 08/10/2013. Patient reported that she started to experience right upper quadrant pain described as a sharp pain that is intermittent, but stated that she started to experience bilateral lower quadrant pain the other day described as a cramping sensation worse with diarrhea. Stated that she noticed diarrhea yesterday - described the diarrhea being mucous with a greenish discoloration. Stated that between yesterday and today she had at least 20 episodes of diarrhea. Stated that when she wiped she started to notice some bright red blood, secondary to raw irritated skin. Stated that she has been feeling nauseous. Denied melena, emesis, chest pain, shortness of breath, difficulty breathing, fever, neck pain, back pain, blurred vision, sudden loss of vision, dizziness, weakness. PCP Dr. Etter Sjogren Neurology Dr. Brett Fairy  Past Medical History  Diagnosis Date  . Diabetes mellitus     2  . Hyperlipidemia   . Hypertension   . Renal mass     BEING WORKED UP BY UROLOGY  . Chronic inflammatory demyelinating polyneuritis   . Guillain-Barre syndrome   . Renal disorder     L kidney removed  . Cancer   . 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 Cesarean section    . Cholecystectomy    . Nephrectomy     Family History  Problem Relation  Age of Onset  . Cancer Father     PROSTATE  . Cancer Sister     BREAST  . Healthy Mother    History  Substance Use Topics  . Smoking status: Former Research scientist (life sciences)  . Smokeless tobacco: Never Used  . Alcohol Use: No   OB History   Grav Para Term Preterm Abortions TAB SAB Ect Mult Living                 Review of Systems  Constitutional: Positive for fatigue. Negative for fever and chills.  Eyes: Negative for visual disturbance.  Respiratory: Negative for chest tightness and shortness of breath.   Cardiovascular: Negative for chest pain.  Gastrointestinal: Positive for nausea, abdominal pain, diarrhea and blood in stool. Negative for vomiting, constipation and anal bleeding.  Genitourinary: Positive for decreased urine volume. Negative for dysuria and pelvic pain.  Musculoskeletal: Negative for back pain and neck pain.  Neurological: Negative for weakness.      Allergies  Biaxin; Codeine; and Erythromycin  Home Medications   Prior to Admission medications   Medication Sig Start Date End Date Taking? Authorizing Provider  amitriptyline (ELAVIL) 10 MG tablet Take 40 mg by mouth at bedtime.   Yes Historical Provider, MD  aspirin EC 81 MG tablet Take 81 mg by mouth daily.   Yes Historical Provider, MD  calcium citrate (CALCITRATE - DOSED IN MG ELEMENTAL CALCIUM) 950 MG tablet Take 200 mg of elemental calcium by mouth daily.   Yes Historical Provider, MD  cholecalciferol (VITAMIN D) 1000 UNITS tablet Take 5,000 Units by mouth daily.   Yes Historical Provider, MD  COD LIVER OIL PO Take 1 capsule by mouth daily.   Yes Historical Provider, MD  gabapentin (NEURONTIN) 300 MG capsule Take 300 mg by mouth 4 (four) times daily.   Yes Historical Provider, MD  lisinopril (PRINIVIL,ZESTRIL) 20 MG tablet Take 20 mg by mouth daily.   Yes Historical Provider, MD  metFORMIN (GLUCOPHAGE) 1000 MG tablet Take 1,000 mg by mouth 2 (two) times daily with a meal.   Yes Historical Provider, MD  promethazine  (PHENERGAN) 25 MG tablet Take 1 tablet (25 mg total) by mouth as needed for nausea. 11/19/12  Yes Asencion Partridge Dohmeier, MD  traMADol (ULTRAM) 50 MG tablet Take 50-100 mg by mouth every 6 (six) hours as needed for moderate pain. Take 1 tablet in the morning and 2 tablets at night   Yes Historical Provider, MD   BP 101/67  Pulse 93  Temp(Src) 98.1 F (36.7 C) (Oral)  Resp 16  SpO2 97% Physical Exam  Nursing note and vitals reviewed. Constitutional: She is oriented to person, place, and time. She appears well-developed and well-nourished. No distress.  HENT:  Head: Normocephalic and atraumatic.  Mouth/Throat: No oropharyngeal exudate.  Dry mucus membranes  Eyes: Conjunctivae and EOM are normal. Pupils are equal, round, and reactive to light. Right eye exhibits no discharge. Left eye exhibits no discharge.  Neck: Normal range of motion. Neck supple. No tracheal deviation present.  Cardiovascular: Normal rate, regular rhythm and normal heart sounds.  Exam reveals no friction rub.   No murmur heard. Pulses:      Radial pulses are 2+ on the right side, and 2+ on the left side.       Dorsalis pedis pulses are 2+ on the right side, and 2+ on the left side.  Cap refill < 3 seconds Negative swelling or pitting edema noted to the lower extremities bilaterally   Pulmonary/Chest: Effort normal and breath sounds normal. No respiratory distress. She has no wheezes. She has no rales.  Abdominal: Soft. Bowel sounds are normal. She exhibits no distension. There is tenderness in the right upper quadrant, right lower quadrant, epigastric area, suprapubic area and left lower quadrant. There is no rebound and no guarding.    Obese Negative abdominal distension noted BS normoactive in all 4 quadrants Negative peritoneal signs Negative rigidity or guarding noted Abdominal discomfort upon palpation to the epigastric and right upper quadrant, as well as lower quadrants of the abdomen   Genitourinary:  Rectal  Exam: Route negative swelling, erythema, inflammation, lesions, sores, external hemorrhoids identified. Strong sphincter tone. Negative bright red blood per rectum. Raw, irritated skin note around the rectum. Negative blood on glove. Light brown stool noted on glove.    Musculoskeletal: Normal range of motion.  Full ROM to upper and lower extremities without difficulty noted, negative ataxia noted.  Lymphadenopathy:    She has no cervical adenopathy.  Neurological: She is alert and oriented to person, place, and time. No cranial nerve deficit. She exhibits normal muscle tone. Coordination normal.  Cranial nerves III-XII grossly intact Strength 5+/5+ to upper and lower extremities bilaterally with resistance applied, equal distribution noted Equal grip strength  Negative facial drooping Negative slurred speech Negative aphasia GCS 15  Skin: Skin is warm and dry. No rash noted. She is not diaphoretic. No erythema.  Psychiatric: She has a normal mood and affect. Her behavior is normal. Thought content normal.  ED Course  Procedures (including critical care time)  12:40 PM This provider spoke with attending physician, Dr. Dollene Cleveland - at this time, attending did not recommend imaging to be performed at this time. Reported that labs and hydration to be performed.   1:49 PM This provider spoke with Dr. Roswell Nickel, [facility] - discussed case, labs, vitals in great detail. Patient to be admitted for elevated creatinine and dehydration.   Results for orders placed during the hospital encounter of 08/13/13  CBC WITH DIFFERENTIAL      Result Value Ref Range   WBC 7.6  4.0 - 10.5 K/uL   RBC 4.31  3.87 - 5.11 MIL/uL   Hemoglobin 13.3  12.0 - 15.0 g/dL   HCT 37.7  36.0 - 46.0 %   MCV 87.5  78.0 - 100.0 fL   MCH 30.9  26.0 - 34.0 pg   MCHC 35.3  30.0 - 36.0 g/dL   RDW 13.0  11.5 - 15.5 %   Platelets 247  150 - 400 K/uL   Neutrophils Relative % 72  43 - 77 %   Neutro Abs 5.5  1.7 -  7.7 K/uL   Lymphocytes Relative 20  12 - 46 %   Lymphs Abs 1.6  0.7 - 4.0 K/uL   Monocytes Relative 6  3 - 12 %   Monocytes Absolute 0.5  0.1 - 1.0 K/uL   Eosinophils Relative 2  0 - 5 %   Eosinophils Absolute 0.1  0.0 - 0.7 K/uL   Basophils Relative 0  0 - 1 %   Basophils Absolute 0.0  0.0 - 0.1 K/uL  COMPREHENSIVE METABOLIC PANEL      Result Value Ref Range   Sodium 135 (*) 137 - 147 mEq/L   Potassium 3.9  3.7 - 5.3 mEq/L   Chloride 95 (*) 96 - 112 mEq/L   CO2 25  19 - 32 mEq/L   Glucose, Bld 358 (*) 70 - 99 mg/dL   BUN 23  6 - 23 mg/dL   Creatinine, Ser 1.45 (*) 0.50 - 1.10 mg/dL   Calcium 9.2  8.4 - 10.5 mg/dL   Total Protein 6.8  6.0 - 8.3 g/dL   Albumin 3.6  3.5 - 5.2 g/dL   AST 19  0 - 37 U/L   ALT 22  0 - 35 U/L   Alkaline Phosphatase 90  39 - 117 U/L   Total Bilirubin 0.7  0.3 - 1.2 mg/dL   GFR calc non Af Amer 37 (*) >90 mL/min   GFR calc Af Amer 43 (*) >90 mL/min   Anion gap 15  5 - 15  LIPASE, BLOOD      Result Value Ref Range   Lipase 28  11 - 59 U/L  MAGNESIUM      Result Value Ref Range   Magnesium 1.8  1.5 - 2.5 mg/dL  POC OCCULT BLOOD, ED      Result Value Ref Range   Fecal Occult Bld NEGATIVE  NEGATIVE    Labs Review Labs Reviewed  COMPREHENSIVE METABOLIC PANEL - Abnormal; Notable for the following:    Sodium 135 (*)    Chloride 95 (*)    Glucose, Bld 358 (*)    Creatinine, Ser 1.45 (*)    GFR calc non Af Amer 37 (*)    GFR calc Af Amer 43 (*)    All other components within normal limits  CLOSTRIDIUM DIFFICILE BY PCR  CBC WITH DIFFERENTIAL  LIPASE,  BLOOD  MAGNESIUM  URINALYSIS, ROUTINE W REFLEX MICROSCOPIC  OCCULT BLOOD X 1 CARD TO LAB, STOOL  SODIUM, URINE, RANDOM  CREATININE, URINE, RANDOM  POC OCCULT BLOOD, ED    Imaging Review No results found.   EKG Interpretation None      MDM   Final diagnoses:  Dehydration  Elevated serum creatinine    Medications  0.9 %  sodium chloride infusion (125 mL/hr Intravenous New Bag/Given  08/14/13 0104)   Filed Vitals:   08/13/13 2256  BP: 101/67  Pulse: 93  Temp: 98.1 F (36.7 C)  TempSrc: Oral  Resp: 16  SpO2: 97%   This provider reviewed patient's chart. Patient had similar episode back in April 2014 where patient was admitted secondary to diarrhea and dehydration. CBC negative elevated white blood cell count-negative left shift or leukocytosis noted. CMP noted hyponatremia 135, low chloride of 95. Glucose 358. Anion gap of 15.0 mEq per liter. Creatinine elevated when compared to October 2014-creatinine elevated from 0.92 to 1.45. Lipase negative elevation. Fecal occult negative. Doubt acute abdominal processes - nonsurgical abdomen noted. Doubt colitis or diverticulitis secondary to no fever, no elevated WBC. Patient appears to be dehydrated. Creatine elevated from 0.92 to 1.45 - patient has only one kidney, left was removed in 2012. Discussed case with attending physician, Dr. Dollene Cleveland - patient to be admitted for dehydration and acute renal failure. Discussed case with Dr. Roswell Nickel - patient to be admitted to hospital for dehydration and for elevated creatinine.   Jamse Mead, PA-C 08/14/13 [phone]

## 2013-08-14 ENCOUNTER — Encounter (HOSPITAL_COMMUNITY): Payer: Self-pay | Admitting: Internal Medicine

## 2013-08-14 DIAGNOSIS — R112 Nausea with vomiting, unspecified: Secondary | ICD-10-CM | POA: Diagnosis not present

## 2013-08-14 DIAGNOSIS — N178 Other acute kidney failure: Secondary | ICD-10-CM | POA: Diagnosis not present

## 2013-08-14 DIAGNOSIS — N179 Acute kidney failure, unspecified: Secondary | ICD-10-CM | POA: Diagnosis present

## 2013-08-14 DIAGNOSIS — E119 Type 2 diabetes mellitus without complications: Secondary | ICD-10-CM

## 2013-08-14 DIAGNOSIS — E860 Dehydration: Secondary | ICD-10-CM | POA: Diagnosis not present

## 2013-08-14 DIAGNOSIS — R197 Diarrhea, unspecified: Secondary | ICD-10-CM | POA: Diagnosis not present

## 2013-08-14 DIAGNOSIS — I10 Essential (primary) hypertension: Secondary | ICD-10-CM | POA: Diagnosis present

## 2013-08-14 DIAGNOSIS — E785 Hyperlipidemia, unspecified: Secondary | ICD-10-CM | POA: Diagnosis present

## 2013-08-14 DIAGNOSIS — A0472 Enterocolitis due to Clostridium difficile, not specified as recurrent: Secondary | ICD-10-CM | POA: Diagnosis present

## 2013-08-14 DIAGNOSIS — R109 Unspecified abdominal pain: Secondary | ICD-10-CM | POA: Diagnosis not present

## 2013-08-14 DIAGNOSIS — Z9049 Acquired absence of other specified parts of digestive tract: Secondary | ICD-10-CM | POA: Diagnosis not present

## 2013-08-14 DIAGNOSIS — G6181 Chronic inflammatory demyelinating polyneuritis: Secondary | ICD-10-CM | POA: Diagnosis present

## 2013-08-14 DIAGNOSIS — Z87891 Personal history of nicotine dependence: Secondary | ICD-10-CM | POA: Diagnosis not present

## 2013-08-14 DIAGNOSIS — Z905 Acquired absence of kidney: Secondary | ICD-10-CM | POA: Diagnosis not present

## 2013-08-14 DIAGNOSIS — Z85528 Personal history of other malignant neoplasm of kidney: Secondary | ICD-10-CM | POA: Diagnosis not present

## 2013-08-14 LAB — CBC
HCT: 35.4 % — ABNORMAL LOW (ref 36.0–46.0)
HEMOGLOBIN: 12.2 g/dL (ref 12.0–15.0)
MCH: 30.2 pg (ref 26.0–34.0)
MCHC: 34.5 g/dL (ref 30.0–36.0)
MCV: 87.6 fL (ref 78.0–100.0)
Platelets: 227 10*3/uL (ref 150–400)
RBC: 4.04 MIL/uL (ref 3.87–5.11)
RDW: 13.1 % (ref 11.5–15.5)
WBC: 6.5 10*3/uL (ref 4.0–10.5)

## 2013-08-14 LAB — URINALYSIS, ROUTINE W REFLEX MICROSCOPIC
Glucose, UA: >1000 mg/dL — AB
HGB URINE DIPSTICK: NEGATIVE
Ketones, ur: NEGATIVE mg/dL
Leukocytes, UA: NEGATIVE
Nitrite: NEGATIVE
Protein, ur: >300 mg/dL — AB
SPECIFIC GRAVITY, URINE: 1.024 (ref 1.005–1.030)
Urobilinogen, UA: 0.2 mg/dL (ref 0.0–1.0)
pH: 5 (ref 5.0–8.0)

## 2013-08-14 LAB — COMPREHENSIVE METABOLIC PANEL
ALK PHOS: 90 U/L (ref 39–117)
ALT: 18 U/L (ref 0–35)
ALT: 22 U/L (ref 0–35)
AST: 16 U/L (ref 0–37)
AST: 19 U/L (ref 0–37)
Albumin: 3.3 g/dL — ABNORMAL LOW (ref 3.5–5.2)
Albumin: 3.6 g/dL (ref 3.5–5.2)
Alkaline Phosphatase: 83 U/L (ref 39–117)
Anion gap: 12 (ref 5–15)
Anion gap: 15 (ref 5–15)
BILIRUBIN TOTAL: 0.7 mg/dL (ref 0.3–1.2)
BUN: 23 mg/dL (ref 6–23)
BUN: 24 mg/dL — ABNORMAL HIGH (ref 6–23)
CALCIUM: 9.2 mg/dL (ref 8.4–10.5)
CHLORIDE: 95 meq/L — AB (ref 96–112)
CO2: 25 meq/L (ref 19–32)
CO2: 28 mEq/L (ref 19–32)
Calcium: 9.2 mg/dL (ref 8.4–10.5)
Chloride: 97 mEq/L (ref 96–112)
Creatinine, Ser: 1.35 mg/dL — ABNORMAL HIGH (ref 0.50–1.10)
Creatinine, Ser: 1.45 mg/dL — ABNORMAL HIGH (ref 0.50–1.10)
GFR calc Af Amer: 43 mL/min — ABNORMAL LOW (ref >90)
GFR calc non Af Amer: 40 mL/min — ABNORMAL LOW (ref >90)
GFR, EST AFRICAN AMERICAN: 47 mL/min — AB (ref >90)
GFR, EST NON AFRICAN AMERICAN: 37 mL/min — AB (ref >90)
GLUCOSE: 337 mg/dL — AB (ref 70–99)
Glucose, Bld: 358 mg/dL — ABNORMAL HIGH (ref 70–99)
POTASSIUM: 3.9 meq/L (ref 3.7–5.3)
Potassium: 4.2 mEq/L (ref 3.7–5.3)
SODIUM: 135 meq/L — AB (ref 137–147)
SODIUM: 137 meq/L (ref 137–147)
Total Bilirubin: 0.5 mg/dL (ref 0.3–1.2)
Total Protein: 6.3 g/dL (ref 6.0–8.3)
Total Protein: 6.8 g/dL (ref 6.0–8.3)

## 2013-08-14 LAB — CBC WITH DIFFERENTIAL/PLATELET
BASOS ABS: 0 10*3/uL (ref 0.0–0.1)
Basophils Relative: 0 % (ref 0–1)
Eosinophils Absolute: 0.1 10*3/uL (ref 0.0–0.7)
Eosinophils Relative: 2 % (ref 0–5)
HEMATOCRIT: 37.7 % (ref 36.0–46.0)
Hemoglobin: 13.3 g/dL (ref 12.0–15.0)
LYMPHS PCT: 20 % (ref 12–46)
Lymphs Abs: 1.6 10*3/uL (ref 0.7–4.0)
MCH: 30.9 pg (ref 26.0–34.0)
MCHC: 35.3 g/dL (ref 30.0–36.0)
MCV: 87.5 fL (ref 78.0–100.0)
Monocytes Absolute: 0.5 10*3/uL (ref 0.1–1.0)
Monocytes Relative: 6 % (ref 3–12)
NEUTROS ABS: 5.5 10*3/uL (ref 1.7–7.7)
NEUTROS PCT: 72 % (ref 43–77)
Platelets: 247 10*3/uL (ref 150–400)
RBC: 4.31 MIL/uL (ref 3.87–5.11)
RDW: 13 % (ref 11.5–15.5)
WBC: 7.6 10*3/uL (ref 4.0–10.5)

## 2013-08-14 LAB — URINE MICROSCOPIC-ADD ON

## 2013-08-14 LAB — SODIUM, URINE, RANDOM: Sodium, Ur: 19 mEq/L

## 2013-08-14 LAB — PHOSPHORUS: Phosphorus: 3.9 mg/dL (ref 2.3–4.6)

## 2013-08-14 LAB — HEMOGLOBIN A1C
Hgb A1c MFr Bld: 11.3 % — ABNORMAL HIGH (ref <5.7)
MEAN PLASMA GLUCOSE: 278 mg/dL — AB (ref <117)

## 2013-08-14 LAB — TSH: TSH: 1.86 u[IU]/mL (ref 0.350–4.500)

## 2013-08-14 LAB — GLUCOSE, CAPILLARY
Glucose-Capillary: 325 mg/dL — ABNORMAL HIGH (ref 70–99)
Glucose-Capillary: 195 mg/dL — ABNORMAL HIGH (ref 70–99)
Glucose-Capillary: 221 mg/dL — ABNORMAL HIGH (ref 70–99)
Glucose-Capillary: 298 mg/dL — ABNORMAL HIGH (ref 70–99)
Glucose-Capillary: 292 mg/dL — ABNORMAL HIGH (ref 70–99)

## 2013-08-14 LAB — MAGNESIUM
MAGNESIUM: 1.8 mg/dL (ref 1.5–2.5)
MAGNESIUM: 1.9 mg/dL (ref 1.5–2.5)

## 2013-08-14 LAB — LIPASE, BLOOD: Lipase: 28 U/L (ref 11–59)

## 2013-08-14 LAB — CREATININE, URINE, RANDOM: Creatinine, Urine: 253.1 mg/dL

## 2013-08-14 LAB — POC OCCULT BLOOD, ED: Fecal Occult Bld: NEGATIVE

## 2013-08-14 MED ORDER — ACETAMINOPHEN 325 MG PO TABS: 650.0000 mg | ORAL_TABLET | Freq: Four times a day (QID) | ORAL | Status: DC | PRN

## 2013-08-14 MED ORDER — LISINOPRIL 10 MG PO TABS
10.0000 mg | ORAL_TABLET | Freq: Every day | ORAL | Status: DC
  Administered 2013-08-14 – 2013-08-15 (×2): 10 mg via ORAL
  Filled 2013-08-14 (×3): qty 1

## 2013-08-14 MED ORDER — ONDANSETRON HCL 4 MG PO TABS: 4.0000 mg | ORAL_TABLET | Freq: Four times a day (QID) | ORAL | Status: DC | PRN

## 2013-08-14 MED ORDER — AMITRIPTYLINE HCL 10 MG PO TABS
40.0000 mg | ORAL_TABLET | Freq: Every day | ORAL | Status: DC
  Administered 2013-08-14: 40 mg via ORAL
  Filled 2013-08-14 (×2): qty 4

## 2013-08-14 MED ORDER — METFORMIN HCL 500 MG PO TABS
1000.0000 mg | ORAL_TABLET | Freq: Two times a day (BID) | ORAL | Status: DC
  Administered 2013-08-14 – 2013-08-15 (×2): 1000 mg via ORAL
  Filled 2013-08-14 (×4): qty 2

## 2013-08-14 MED ORDER — ENOXAPARIN SODIUM 40 MG/0.4ML ~~LOC~~ SOLN
40.0000 mg | SUBCUTANEOUS | Status: DC
  Administered 2013-08-14 – 2013-08-15 (×2): 40 mg via SUBCUTANEOUS
  Filled 2013-08-14 (×2): qty 0.4

## 2013-08-14 MED ORDER — INSULIN ASPART 100 UNIT/ML ~~LOC~~ SOLN
0.0000 [IU] | SUBCUTANEOUS | Status: DC
  Administered 2013-08-14: 2 [IU] via SUBCUTANEOUS
  Administered 2013-08-14: 7 [IU] via SUBCUTANEOUS
  Administered 2013-08-14: 5 [IU] via SUBCUTANEOUS
  Administered 2013-08-14: 3 [IU] via SUBCUTANEOUS

## 2013-08-14 MED ORDER — TRAMADOL HCL 50 MG PO TABS
50.0000 mg | ORAL_TABLET | Freq: Four times a day (QID) | ORAL | Status: DC | PRN
  Administered 2013-08-14: 50 mg via ORAL
  Administered 2013-08-15: 100 mg via ORAL
  Filled 2013-08-14: qty 1
  Filled 2013-08-14: qty 2

## 2013-08-14 MED ORDER — SODIUM CHLORIDE 0.9 % IV SOLN
INTRAVENOUS | Status: DC
  Administered 2013-08-14: 10:00:00 via INTRAVENOUS
  Administered 2013-08-14: 125 mL/h via INTRAVENOUS

## 2013-08-14 MED ORDER — ASPIRIN EC 81 MG PO TBEC
81.0000 mg | DELAYED_RELEASE_TABLET | Freq: Every day | ORAL | Status: DC
  Administered 2013-08-14 – 2013-08-15 (×2): 81 mg via ORAL
  Filled 2013-08-14 (×2): qty 1

## 2013-08-14 MED ORDER — HYDROCODONE-ACETAMINOPHEN 5-325 MG PO TABS: 1.0000 | ORAL_TABLET | ORAL | Status: DC | PRN

## 2013-08-14 MED ORDER — INSULIN ASPART 100 UNIT/ML ~~LOC~~ SOLN
0.0000 [IU] | Freq: Three times a day (TID) | SUBCUTANEOUS | Status: DC
  Administered 2013-08-15: 3 [IU] via SUBCUTANEOUS

## 2013-08-14 MED ORDER — ONDANSETRON HCL 4 MG/2ML IJ SOLN: 4.0000 mg | Freq: Four times a day (QID) | INTRAMUSCULAR | Status: DC | PRN

## 2013-08-14 MED ORDER — INSULIN ASPART 100 UNIT/ML ~~LOC~~ SOLN
0.0000 [IU] | Freq: Every day | SUBCUTANEOUS | Status: DC
  Administered 2013-08-14: 3 [IU] via SUBCUTANEOUS

## 2013-08-14 MED ORDER — ACETAMINOPHEN 650 MG RE SUPP: 650.0000 mg | Freq: Four times a day (QID) | RECTAL | Status: DC | PRN

## 2013-08-14 MED ORDER — GABAPENTIN 300 MG PO CAPS
300.0000 mg | ORAL_CAPSULE | Freq: Four times a day (QID) | ORAL | Status: DC
  Administered 2013-08-14 – 2013-08-15 (×5): 300 mg via ORAL
  Filled 2013-08-14 (×8): qty 1

## 2013-08-14 NOTE — Progress Notes (Signed)
INITIAL NUTRITION ASSESSMENT  DOCUMENTATION CODES Per approved criteria  -Obesity Unspecified   INTERVENTION: - Diet advancement per MD - Ensure Complete BID - RD to continue to monitor   NUTRITION DIAGNOSIS: Altered GI function related to diarrhea, nausea as evidenced by H&P.    Goal: 1. Resolution of nausea and diarrhea 2. Pt to consume >90% of meals/supplements  Monitor:  Weights, labs, diet advancement, nausea/diarrhea  Reason for Assessment: Malnutrition screening tool   63 y.o. female  Admitting Dx: Nausea and diarrhea   ASSESSMENT: Hx of DM, HLD, HTN, renal CA s/p nephrectomy, Guillain-Barre syndrome, admitted with 4 days of mild abdominal cramping, severe diarrhea and nausea no vomiting. Reports lack of appetite for few days. She has decreased her PO liquid intake because she felt that drinking fluids made her diarrhea worse.   - Attempted multiple times to speak to pt however she was sound asleep - Per weight trend, pt has had stable weight - RN unavailable to speak to RD, unsure how pt has been feeling today    Height: Ht Readings from Last 1 Encounters:  08/14/13 5\' 1"  (1.549 m)    Weight: Wt Readings from Last 1 Encounters:  08/14/13 200 lb (90.719 kg)    Ideal Body Weight: 105 lbs   % Ideal Body Weight: 190%  Wt Readings from Last 10 Encounters:  08/14/13 200 lb (90.719 kg)  11/19/12 200 lb (90.719 kg)  08/01/12 204 lb (92.534 kg)  06/30/12 209 lb 3.5 oz (94.9 kg)  06/25/12 206 lb 12.7 oz (93.8 kg)  06/18/12 200 lb (90.719 kg)  05/31/12 210 lb 6.4 oz (95.437 kg)  01/26/10 188 lb (85.276 kg)  10/19/09 197 lb 6.4 oz (89.54 kg)    Usual Body Weight: 200 lbs   % Usual Body Weight: 100%   BMI:  Body mass index is 37.81 kg/(m^2). Class II obesity   Estimated Nutritional Needs: Kcal: 1400-1600 Protein: 60-75g Fluid: 1.4-1.6L/day  Skin: Intact   Diet Order: Full Liquid  EDUCATION NEEDS: -No education needs identified at this  time   Intake/Output Summary (Last 24 hours) at 08/14/13 1609 Last data filed at 08/14/13 1308  Gross per 24 hour  Intake 1341.67 ml  Output    175 ml  Net 1166.67 ml    Last BM: 7/2  Labs:   Recent Labs Lab 08/14/13 0008 08/14/13 0134 08/14/13 0450  NA 135*  --  137  K 3.9  --  4.2  CL 95*  --  97  CO2 25  --  28  BUN 23  --  24*  CREATININE 1.45*  --  1.35*  CALCIUM 9.2  --  9.2  MG  --  1.8 1.9  PHOS  --   --  3.9  GLUCOSE 358*  --  337*    CBG (last 3)   Recent Labs  08/14/13 0740 08/14/13 1208 08/14/13 1601  GLUCAP 195* 221* 298*    Scheduled Meds: . amitriptyline  40 mg Oral QHS  . aspirin EC  81 mg Oral Daily  . enoxaparin (LOVENOX) injection  40 mg Subcutaneous Q24H  . gabapentin  300 mg Oral QID  . insulin aspart  0-9 Units Subcutaneous 6 times per day  . lisinopril  10 mg Oral Daily  . metFORMIN  1,000 mg Oral BID WC    Continuous Infusions: . sodium chloride 125 mL/hr at 08/14/13 [id number]    Past Medical History  Diagnosis Date  . Diabetes mellitus  2  . Hyperlipidemia   . Hypertension   . Renal mass     BEING WORKED UP BY UROLOGY  . Chronic inflammatory demyelinating polyneuritis   . Guillain-Barre syndrome   . Renal disorder     L kidney removed  . Cancer   . 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 Cesarean section    . Cholecystectomy    . Nephrectomy      Carlis Stable MS, RD, LDN [phone] Pager [phone] Weekend/After Hours Pager

## 2013-08-14 NOTE — Progress Notes (Signed)
Inpatient Diabetes Program Recommendations  AACE/ADA: New Consensus Statement on Inpatient Glycemic Control (2013)  Target Ranges:  Prepandial:   less than 140 mg/dL      Peak postprandial:   less than 180 mg/dL (1-2 hours)      Critically ill patients:  140 - 180 mg/dL   Reason for Visit: Hyperglycemia  Diabetes history: DM2 Outpatient Diabetes medications: metformin 1000 mg bid Current orders for Inpatient glycemic control: Novolog sensitive Q4H, metformin 1000 mg bid  63 y/o F with PMHX of diabetes, hyperlipidemia, hypertension, nephrectomy of left kidney in 2011, cholecystectomy, GBS presenting to the ED with abdominal pain, nausea, diarrhea.  Results for Nicole, Strong (MRN [id number]) as of 08/14/2013 15:54  Ref. Range 08/14/2013 04:33 08/14/2013 07:40 08/14/2013 12:08 08/14/2013 16:01  Glucose-Capillary Latest Range: 70-99 mg/dL 325 (H) 195 (H) 221 (H) 298 (H)  Results for Nicole, Strong (MRN [id number]) as of 08/14/2013 15:54  Ref. Range 08/14/2013 04:50  Hemoglobin A1C Latest Range: <5.7 % 11.3 (H)   Uncontrolled blood sugars. Needs basal insulin.  Inpatient Diabetes Program Recommendations Insulin - Basal: Consider addition of Lantus 10 units QHS Correction (SSI): Novolog sensitive Q4H. When diet advanced, tidwc and hs HgbA1C: 11.3% - uncontrolled Outpatient Referral: OP Diabetes Education consult for uncontrolled blood sugars. Diet: When advanced, CHO mod med  Note: Will continue to follow. Thank you. Lorenda Peck, RD, LDN, CDE Inpatient Diabetes Coordinator [phone]

## 2013-08-14 NOTE — H&P (Signed)
PCP: Garnet Koyanagi, DO    Chief Complaint:  Nausea and diarrhea  HPI: Nicole Strong is a 63 y.o. female   has a past medical history of Diabetes mellitus; Hyperlipidemia; Hypertension; Renal mass; Chronic inflammatory demyelinating polyneuritis; Guillain-Barre syndrome; Renal disorder; Cancer; and Single kidney.   Presented with  4 day history of mild abdominal cramping, severe diarrhea and nausea no vomiting. Reports lack of appetite for few days. Last time she ate at [employer] and since then started to have the symptoms. She has had decreased urine output. She have decreased her PO liquid intake because she felt that drinking fluids made her diarrhea worse.  She ave not had any recent antibiotics. She is sp nephrectomy due to hx of renal cancer. She is recovering from Guillain-Barre  syndrome and still have residual generalized weakness  Hospitalist was called for admission for dehydration, ARF in the setting of diarrhea.   Review of Systems:    Pertinent positives include:   abdominal pain, nausea,  diarrhea,   Constitutional:  No weight loss, night sweats, Fevers, chills, fatigue, weight loss  HEENT:  No headaches, Difficulty swallowing,Tooth/dental problems,Sore throat,  No sneezing, itching, ear ache, nasal congestion, post nasal drip,  Cardio-vascular:  No chest pain, Orthopnea, PND, anasarca, dizziness, palpitations.no Bilateral lower extremity swelling  GI:  No heartburn, indigestion,vomitingchange in bowel habits, loss of appetite, melena, blood in stool, hematemesis Resp:  no shortness of breath at rest. No dyspnea on exertion, No excess mucus, no productive cough, No non-productive cough, No coughing up of blood.No change in color of mucus.No wheezing. Skin:  no rash or lesions. No jaundice GU:  no dysuria, change in color of urine, no urgency or frequency. No straining to urinate.  No flank pain.  Musculoskeletal:  No joint pain or no joint swelling. No  decreased range of motion. No back pain.  Psych:  No change in mood or affect. No depression or anxiety. No memory loss.  Neuro: no localizing neurological complaints, no tingling, no weakness, no double vision, no gait abnormality, no slurred speech, no confusion  Otherwise ROS are negative except for above, 10 systems were reviewed  Past Medical History: Past Medical History  Diagnosis Date  . Diabetes mellitus     2  . Hyperlipidemia   . Hypertension   . Renal mass     BEING WORKED UP BY UROLOGY  . Chronic inflammatory demyelinating polyneuritis   . Guillain-Barre syndrome   . Renal disorder     L kidney removed  . Cancer   . 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 Cesarean section    . Cholecystectomy    . Nephrectomy       Medications: Prior to Admission medications   Medication Sig Start Date End Date Taking? Authorizing Provider  amitriptyline (ELAVIL) 10 MG tablet Take 40 mg by mouth at bedtime.   Yes Historical Provider, MD  aspirin EC 81 MG tablet Take 81 mg by mouth daily.   Yes Historical Provider, MD  calcium citrate (CALCITRATE - DOSED IN MG ELEMENTAL CALCIUM) 950 MG tablet Take 200 mg of elemental calcium by mouth daily.   Yes Historical Provider, MD  cholecalciferol (VITAMIN D) 1000 UNITS tablet Take 5,000 Units by mouth daily.   Yes Historical Provider, MD  COD LIVER OIL PO Take 1 capsule by mouth daily.   Yes Historical Provider, MD  gabapentin (  NEURONTIN) 300 MG capsule Take 300 mg by mouth 4 (four) times daily.   Yes Historical Provider, MD  lisinopril (PRINIVIL,ZESTRIL) 20 MG tablet Take 20 mg by mouth daily.   Yes Historical Provider, MD  metFORMIN (GLUCOPHAGE) 1000 MG tablet Take 1,000 mg by mouth 2 (two) times daily with a meal.   Yes Historical Provider, MD  promethazine (PHENERGAN) 25 MG tablet Take 1 tablet (25 mg total) by mouth as needed for nausea. 11/19/12  Yes Asencion Partridge Dohmeier, MD    traMADol (ULTRAM) 50 MG tablet Take 50-100 mg by mouth every 6 (six) hours as needed for moderate pain. Take 1 tablet in the morning and 2 tablets at night   Yes Historical Provider, MD    Allergies:   Allergies  Allergen Reactions  . Biaxin [Clarithromycin] Other (See Comments)    "face turns red"  . Codeine Nausea And Vomiting and Other (See Comments)    Hallucinations  . Erythromycin Nausea And Vomiting    Social History:  Ambulatory  independently   Lives at home alone,        reports that she has quit smoking. She has never used smokeless tobacco. She reports that she does not drink alcohol or use illicit drugs.    Family History: family history includes Cancer in her father and sister; Healthy in her mother.    Physical Exam: Patient Vitals for the past 24 hrs:  BP Temp Temp src Pulse Resp SpO2  08/13/13 2256 101/67 mmHg 98.1 F (36.7 C) Oral 93 16 97 %    1. General:  in No Acute distress 2. Psychological: Alert and  Oriented 3. Head/ENT:    Dry Mucous Membranes                          Head Non traumatic, neck supple                          Normal   Dentition 4. SKIN:   decreased Skin turgor,  Skin clean Dry and intact no rash 5. Heart: Regular rate and rhythm no Murmur, Rub or gallop 6. Lungs:no wheezes some crackles  at the bases 7. Abdomen: Soft, non-tender, Non distended obese 8. Lower extremities: no clubbing, cyanosis, or edema 9. Neurologically Grossly intact, moving all 4 extremities equally 10. MSK: Normal range of motion  body mass index is unknown because there is no weight on file.   Labs on Admission:   Recent Labs  08/14/13 0008 08/14/13 0134  NA 135*  --   K 3.9  --   CL 95*  --   CO2 25  --   GLUCOSE 358*  --   BUN 23  --   CREATININE 1.45*  --   CALCIUM 9.2  --   MG  --  1.8    Recent Labs  08/14/13 0008  AST 19  ALT 22  ALKPHOS 90  BILITOT 0.7  PROT 6.8  ALBUMIN 3.6    Recent Labs  08/14/13 0008  LIPASE 28     Recent Labs  08/14/13 0008  WBC 7.6  NEUTROABS 5.5  HGB 13.3  HCT 37.7  MCV 87.5  PLT 247   No results found for this basename: CKTOTAL, CKMB, CKMBINDEX, TROPONINI,  in the last 72 hours No results found for this basename: TSH, T4TOTAL, FREET3, T3FREE, THYROIDAB,  in the last 72 hours No results found for this basename: VITAMINB12,  FOLATE, FERRITIN, TIBC, IRON, RETICCTPCT,  in the last 72 hours Lab Results  Component Value Date   HGBA1C 9.6* 07/24/2012    The CrCl is unknown because both a height and weight (above a minimum accepted value) are required for this calculation. ABG    Component Value Date/Time   TCO2 29 07/02/2012 1405     Lab Results  Component Value Date   DDIMER  Value: 0.57        AT THE INHOUSE ESTABLISHED CUTOFF VALUE OF 0.48 ug/mL FEU, THIS ASSAY HAS BEEN DOCUMENTED IN THE LITERATURE TO HAVE A SENSITIVITY AND NEGATIVE PREDICTIVE VALUE OF AT LEAST 98 TO 99%.  THE TEST RESULT SHOULD BE CORRELATED WITH AN ASSESSMENT OF THE CLINICAL PROBABILITY OF DVT / VTE.* 05/19/2010     BNP (last 3 results) No results found for this basename: PROBNP,  in the last 8760 hours  There were no vitals filed for this visit.   Cultures:    Component Value Date/Time   SDES STOOL 05/31/2012 0642   SPECREQUEST Normal 05/31/2012 0642   CULT NO SALMONELLA, SHIGELLA, CAMPYLOBACTER, YERSINIA, OR E.COLI 0157:H7 ISOLATED 05/31/2012 [id number]   REPTSTATUS 06/03/2012 FINAL 05/31/2012 [id number]         Radiological Exams on Admission: No results found.  Chart has been reviewed  Assessment/Plan  63 year old female history of colectomy secondary to renal cell carcinoma here with diarrhea resulting in acute renal failure and dehydration  Present on Admission:  . Acute renal failure most likely secondary to dehydration. We'll give IV fluids check urine electrolytes hold ACE inhibitor  . Dehydration give IV fluids and monitor patient  . DIABETES MELLITUS, TYPE II continue sliding scale  hold metformin  . Diarrhea obtain stool cultures   Prophylaxis:   Lovenox,  CODE STATUS:  FULL CODE   Other plan as per orders.  I have spent a total of 55 min on this admission  Nicole Strong 08/14/2013, 2:37 AM  [facility]  Pager [phone]   If 7AM-7PM, please contact the day team taking care of the patient  [email or web address]  Password [email or web address]

## 2013-08-14 NOTE — Progress Notes (Signed)
[facility] PROGRESS NOTE  Nicole Strong [id number] DOB: 10/05/1950 DOA: 08/13/2013 PCP: Garnet Koyanagi, DO  Assessment/Plan: 1. Suspected Acute Infectious Gastroenteritis -Continue supportive care, IV fluids -Will advance her diet to Full Liquid Diet  2. Acute Kidney Injury -Likely secondary to prerenal azotemia.  -Creatinine trending down to 1.35 from 1.45 on admission  3. Type 2 Diabetes Mellitus -Blood sugars elevated, will restart her metformin given improvement to kidney funciton  4. HTN -Will restart 20 mg PO q daily  Code Status: Full Family Communication:  Disposition Plan: Continue supportive care, anticipate discharge in the next 24 hours    HPI/Subjective: Patient is a 63 y/o female with a past medical history of diabetes, HTN,Guillain-Barre syndrome who was admitted to the medicine service on 08/14/2013 presenting with 4 day history of mild abdominal cramping, severe diarrhea and nausea no vomiting. Labs showed AKI. Was placed in overnight observation, given IV fluids, this morning things she is feeling better   Objective: Filed Vitals:   08/14/13 1308  BP: 145/97  Pulse: 73  Temp: 97.8 F (36.6 C)  Resp: 18    Intake/Output Summary (Last 24 hours) at 08/14/13 1428 Last data filed at 08/14/13 1308  Gross per 24 hour  Intake 1341.67 ml  Output    175 ml  Net 1166.67 ml   Filed Weights   08/14/13 0403  Weight: 90.719 kg (200 lb)    Exam:   General:  No acute distress, awake and alert  Cardiovascular: Regular rate and rhythm normal S1S2  Respiratory: Clear to auscultation  Abdomen: Mild generalized tenderness to palpation  Musculoskeletal: No edema   Data Reviewed: Basic Metabolic Panel:  Recent Labs Lab 08/14/13 0008 08/14/13 0134 08/14/13 0450  NA 135*  --  137  K 3.9  --  4.2  CL 95*  --  97  CO2 25  --  28  GLUCOSE 358*  --  337*  BUN 23  --  24*  CREATININE 1.45*  --  1.35*  CALCIUM 9.2  --  9.2  MG  --  1.8 1.9   PHOS  --   --  3.9   Liver Function Tests:  Recent Labs Lab 08/14/13 0008 08/14/13 0450  AST 19 16  ALT 22 18  ALKPHOS 90 83  BILITOT 0.7 0.5  PROT 6.8 6.3  ALBUMIN 3.6 3.3*    Recent Labs Lab 08/14/13 0008  LIPASE 28   No results found for this basename: AMMONIA,  in the last 168 hours CBC:  Recent Labs Lab 08/14/13 0008 08/14/13 0450  WBC 7.6 6.5  NEUTROABS 5.5  --   HGB 13.3 12.2  HCT 37.7 35.4*  MCV 87.5 87.6  PLT 247 227   Cardiac Enzymes: No results found for this basename: CKTOTAL, CKMB, CKMBINDEX, TROPONINI,  in the last 168 hours BNP (last 3 results) No results found for this basename: PROBNP,  in the last 8760 hours CBG:  Recent Labs Lab 08/14/13 0433 08/14/13 0740 08/14/13 1208  GLUCAP 325* 195* 221*    No results found for this or any previous visit (from the past 240 hour(s)).   Studies: No results found.  Scheduled Meds: . amitriptyline  40 mg Oral QHS  . aspirin EC  81 mg Oral Daily  . enoxaparin (LOVENOX) injection  40 mg Subcutaneous Q24H  . gabapentin  300 mg Oral QID  . insulin aspart  0-9 Units Subcutaneous 6 times per day   Continuous Infusions: . sodium chloride 125 mL/hr  at 08/14/13 [id number]    Active Problems:   DIABETES MELLITUS, TYPE II   Diarrhea   Dehydration   ARF (acute renal failure)   Acute renal failure    Time spent: 25 min    Kelvin Cellar  [facility] Pager [phone]. If 7PM-7AM, please contact night-coverage at [email or web address], password [facility] 08/14/2013, 2:28 PM  LOS: 1 day

## 2013-08-15 ENCOUNTER — Other Ambulatory Visit: Payer: Self-pay | Admitting: Endocrinology

## 2013-08-15 DIAGNOSIS — A0472 Enterocolitis due to Clostridium difficile, not specified as recurrent: Secondary | ICD-10-CM | POA: Diagnosis present

## 2013-08-15 DIAGNOSIS — R112 Nausea with vomiting, unspecified: Secondary | ICD-10-CM

## 2013-08-15 DIAGNOSIS — R5381 Other malaise: Secondary | ICD-10-CM

## 2013-08-15 DIAGNOSIS — R5383 Other fatigue: Secondary | ICD-10-CM

## 2013-08-15 LAB — CLOSTRIDIUM DIFFICILE BY PCR: Toxigenic C. Difficile by PCR: POSITIVE — AB

## 2013-08-15 LAB — BASIC METABOLIC PANEL
Anion gap: 10 (ref 5–15)
BUN: 18 mg/dL (ref 6–23)
CHLORIDE: 103 meq/L (ref 96–112)
CO2: 24 meq/L (ref 19–32)
CREATININE: 1 mg/dL (ref 0.50–1.10)
Calcium: 7.8 mg/dL — ABNORMAL LOW (ref 8.4–10.5)
GFR calc Af Amer: 67 mL/min — ABNORMAL LOW (ref >90)
GFR calc non Af Amer: 58 mL/min — ABNORMAL LOW (ref >90)
Glucose, Bld: 264 mg/dL — ABNORMAL HIGH (ref 70–99)
Potassium: 3.7 mEq/L (ref 3.7–5.3)
Sodium: 137 mEq/L (ref 137–147)

## 2013-08-15 LAB — GLUCOSE, CAPILLARY
Glucose-Capillary: 218 mg/dL — ABNORMAL HIGH (ref 70–99)
Glucose-Capillary: 268 mg/dL — ABNORMAL HIGH (ref 70–99)

## 2013-08-15 MED ORDER — VANCOMYCIN 50 MG/ML ORAL SOLUTION: 125.0000 mg | Freq: Four times a day (QID) | ORAL | Status: DC

## 2013-08-15 MED ORDER — METOCLOPRAMIDE HCL 5 MG PO TABS: 5.0000 mg | ORAL_TABLET | Freq: Three times a day (TID) | ORAL | Status: DC

## 2013-08-15 MED ORDER — VANCOMYCIN 50 MG/ML ORAL SOLUTION
125.0000 mg | Freq: Four times a day (QID) | ORAL | Status: DC
  Administered 2013-08-15: 125 mg via ORAL
  Filled 2013-08-15 (×4): qty 2.5

## 2013-08-15 MED ORDER — METRONIDAZOLE 500 MG PO TABS: 500.0000 mg | ORAL_TABLET | Freq: Three times a day (TID) | ORAL | Status: DC

## 2013-08-15 NOTE — Discharge Summary (Addendum)
Physician Discharge Summary  Nicole Strong [id number] DOB: [birth year] DOA: 08/13/2013  PCP: Garnet Koyanagi, DO  Admit date: 08/13/2013 Discharge date: 08/15/2013  Time spent: 35 minutes  Recommendations for Outpatient Follow-up:  1. Please follow up on patient's blood sugars, she was hyperglycemic during this hospitalization 2. Repeat BMP in 1 week, had AKI due to dehydration  Discharge Diagnoses:  Principal Problem:   Enteritis due to Clostridium difficile Active Problems:   DIABETES MELLITUS, TYPE II   Diarrhea   Dehydration   ARF (acute renal failure)   Acute renal failure   Discharge Condition: Stable  Diet recommendation: Carb Modified Diet  Filed Weights   08/14/13 0403  Weight: 90.719 kg (200 lb)    History of present illness:  Presented with 4 day history of mild abdominal cramping, severe diarrhea and nausea no vomiting. Reports lack of appetite for few days. Last time she ate at [employer] and since then started to have the symptoms. She has had decreased urine output. She have decreased her PO liquid intake because she felt that drinking fluids made her diarrhea worse.  She ave not had any recent antibiotics. She is sp nephrectomy due to hx of renal cancer. She is recovering from Guillain-Barre syndrome and still have residual generalized weakness   Hospital Course:  Patient is a pleasant 63 year old female with a history of type 2 diabetes mellitus, hypertension, dyslipidemia, admitted to the medicine service on 08/14/2013 presenting with multiple episodes of diarrhea, nausea and vomiting. Initial lab work revealed the development of acute kidney injury having creatinine of 1.45. Cause of acute kidney injury felt to be secondary to prerenal azotemia from GI losses.  She was administered IV fluids. By 08/15/2013 her creatinine improved to 1.0 with BUN of 18. She was tolerating a regular diet and felt well enough to go home. On 08/15/2013 lab notified me just before  discharge that her stool came back positive for C diff. Patient was tolerating PO, actually reported improvement to her diarrheal symptoms and abdominal pain and felt she could manage at home. She was prescribed oral Vancomycin, called her pharmacy to coinfirm that this medication was available over the holiday weekend. She was discharged condition on 08/15/2013.     Discharge Exam: Filed Vitals:   08/15/13 0641  BP: 146/59  Pulse: 73  Temp: 98.2 F (36.8 C)  Resp: 18    General: She is awake and alert, no acute distress. States feeling better, tolerating by mouth intake Cardiovascular: Regular rate rhythm normal S1-S2 Respiratory: Clear to auscultation bilaterally no wheezing or rhonchi  Abdomen: Soft nontender nondistended  Discharge Instructions You were cared for by a hospitalist during your hospital stay. If you have any questions about your discharge medications or the care you received while you were in the hospital after you are discharged, you can call the unit and asked to speak with the hospitalist on call if the hospitalist that took care of you is not available. Once you are discharged, your primary care physician will handle any further medical issues. Please note that NO REFILLS for any discharge medications will be authorized once you are discharged, as it is imperative that you return to your primary care physician (or establish a relationship with a primary care physician if you do not have one) for your aftercare needs so that they can reassess your need for medications and monitor your lab values.      Discharge Instructions   Call MD for:  difficulty breathing, headache  or visual disturbances    Complete by:  As directed      Call MD for:  extreme fatigue    Complete by:  As directed      Call MD for:  hives    Complete by:  As directed      Call MD for:  persistant dizziness or light-headedness    Complete by:  As directed      Call MD for:  persistant nausea and  vomiting    Complete by:  As directed      Call MD for:  severe uncontrolled pain    Complete by:  As directed      Call MD for:  temperature >100.4    Complete by:  As directed      Diet - low sodium heart healthy    Complete by:  As directed      Increase activity slowly    Complete by:  As directed             Medication List         amitriptyline 10 MG tablet  Commonly known as:  ELAVIL  Take 40 mg by mouth at bedtime.     aspirin EC 81 MG tablet  Take 81 mg by mouth daily.     calcium citrate 950 MG tablet  Commonly known as:  CALCITRATE - dosed in mg elemental calcium  Take 200 mg of elemental calcium by mouth daily.     cholecalciferol 1000 UNITS tablet  Commonly known as:  VITAMIN D  Take 5,000 Units by mouth daily.     COD LIVER OIL PO  Take 1 capsule by mouth daily.     gabapentin 300 MG capsule  Commonly known as:  NEURONTIN  Take 300 mg by mouth 4 (four) times daily.     lisinopril 20 MG tablet  Commonly known as:  PRINIVIL,ZESTRIL  Take 20 mg by mouth daily.     metFORMIN 1000 MG tablet  Commonly known as:  GLUCOPHAGE  Take 1,000 mg by mouth 2 (two) times daily with a meal.     metoCLOPramide 5 MG tablet  Commonly known as:  REGLAN  Take 1 tablet (5 mg total) by mouth 3 (three) times daily before meals.     promethazine 25 MG tablet  Commonly known as:  PHENERGAN  Take 1 tablet (25 mg total) by mouth as needed for nausea.     traMADol 50 MG tablet  Commonly known as:  ULTRAM  Take 50-100 mg by mouth every 6 (six) hours as needed for moderate pain. Take 1 tablet in the morning and 2 tablets at night     vancomycin 50 mg/mL oral solution  Commonly known as:  VANCOCIN  Take 2.5 mLs (125 mg total) by mouth every 6 (six) hours.       Allergies  Allergen Reactions  . Biaxin [Clarithromycin] Other (See Comments)    "face turns red"  . Codeine Nausea And Vomiting and Other (See Comments)    Hallucinations  . Erythromycin Nausea And Vomiting    Follow-up Information   Follow up with Garnet Koyanagi, DO In 1 week.   Specialty:  Family Medicine   Contact information:   [phone] [place] [address] [phone]        The results of significant diagnostics from this hospitalization (including imaging, microbiology, ancillary and laboratory) are listed below for reference.    Significant Diagnostic Studies: No results found.  Microbiology: Recent Results (from the past 240 hour(s))  CLOSTRIDIUM DIFFICILE BY PCR     Status: Abnormal   Collection Time    08/14/13  8:34 PM      Result Value Ref Range Status   C difficile by pcr POSITIVE (*) NEGATIVE Final   Comment: CRITICAL RESULT CALLED TO, READ BACK BY AND VERIFIED WITH:     P.CORBITT,RN AT 1010 BY L.PITT  08/15/13     Performed at [place]: Basic Metabolic Panel:  Recent Labs Lab 08/14/13 0008 08/14/13 0134 08/14/13 0450 08/15/13 0433  NA 135*  --  137 137  K 3.9  --  4.2 3.7  CL 95*  --  97 103  CO2 25  --  28 24  GLUCOSE 358*  --  337* 264*  BUN 23  --  24* 18  CREATININE 1.45*  --  1.35* 1.00  CALCIUM 9.2  --  9.2 7.8*  MG  --  1.8 1.9  --   PHOS  --   --  3.9  --    Liver Function Tests:  Recent Labs Lab 08/14/13 0008 08/14/13 0450  AST 19 16  ALT 22 18  ALKPHOS 90 83  BILITOT 0.7 0.5  PROT 6.8 6.3  ALBUMIN 3.6 3.3*    Recent Labs Lab 08/14/13 0008  LIPASE 28   No results found for this basename: AMMONIA,  in the last 168 hours CBC:  Recent Labs Lab 08/14/13 0008 08/14/13 0450  WBC 7.6 6.5  NEUTROABS 5.5  --   HGB 13.3 12.2  HCT 37.7 35.4*  MCV 87.5 87.6  PLT 247 227   Cardiac Enzymes: No results found for this basename: CKTOTAL, CKMB, CKMBINDEX, TROPONINI,  in the last 168 hours BNP: BNP (last 3 results) No results found for this basename: PROBNP,  in the last 8760 hours CBG:  Recent Labs Lab 08/14/13 0740 08/14/13 1208 08/14/13 1601 08/14/13 2144 08/15/13 0718  GLUCAP 195*  221* 298* 292* 218*       Signed:  Kelvin Cellar  [facility] 08/15/2013, 10:53 AM

## 2013-08-15 NOTE — Progress Notes (Signed)
CARE MANAGEMENT NOTE 08/15/2013  Patient:  Nicole Strong, Nicole Strong   Account Number:  [id number]  Date Initiated:  08/14/2013  Documentation initiated by:  DAVIS,RHONDA  Subjective/Objective Assessment:   pt with hx of one functional kidney admitted due to dehyration and decreased urinary outpt.  bun and creat elevated on admission poss c.diff being ruled out.     Action/Plan:   home when stable/patient lives alone   Anticipated DC Date:  08/17/2013   Anticipated DC Plan:  HOME/SELF CARE  In-house referral  NA      DC Planning Services  CM consult      PAC Choice  NA   Choice offered to / List presented to:  NA      DME agency  NA     [place] arranged  NA      [place] agency  NA   Status of service:  Completed, signed off Medicare Important Message given?  YES (If response is "NO", the following Medicare IM given date fields will be blank) Date Medicare IM given:  08/15/2013 Medicare IM given by:  [facility] Date Additional Medicare IM given:   Additional Medicare IM given by:    Discharge Disposition:  [place]  Per UR Regulation:  Reviewed for med. necessity/level of care/duration of stay  If discussed at [place] of Stay Meetings, dates discussed:    Comments:  08/15/2013 1300 NCM spoke to MD and provided information about Vancomycin. MD requested pt follow up with her PCP if unable to afford additional days. Will follow up with pt on 08/17/2013. Jonnie Finner RN CCM Case Mgmt phone [phone]  08/15/2013 1200 NCM spoke to pt and states she contacted [facility]. Vancomycin was $400 for copay just for 8 days. States she will have her mother charge $400 to her charge card. She is unsure how much the additional days will cost for medication. Explained NCM unable to check benefits for medication. Jonnie Finner RN CCM Case Mgmt phone [phone]   [facility]

## 2013-08-15 NOTE — Progress Notes (Signed)
Received notice of positive c diff from  [facility] lab. Discussed with Dr Coralyn Pear.

## 2013-08-17 NOTE — Telephone Encounter (Signed)
Please refill x 3 months Ov is due 

## 2013-08-18 LAB — STOOL CULTURE: Special Requests: NORMAL

## 2013-08-19 NOTE — ED Provider Notes (Signed)
Medical screening examination/treatment/procedure(s) were conducted as a shared visit with non-physician practitioner(s) and myself.  I personally evaluated the patient during the encounter.   EKG Interpretation None     Pt with abd pain, diarrhea. Non peritoneal exam. 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 Will admit for AKI and hydrate.  Varney Biles, MD 08/19/13 [phone]

## 2013-08-27 ENCOUNTER — Telehealth: Payer: Self-pay | Admitting: Neurology

## 2013-08-27 ENCOUNTER — Telehealth: Payer: Self-pay | Admitting: *Deleted

## 2013-08-27 NOTE — Telephone Encounter (Signed)
A form was received from [employer] stating that the patient had made a request for mobility equipment to be serviced or repaired.  And that would require a face to face visit with Dr. Brett Fairy.  The patient states that she does not need to come in, the request was just for a battery replacement and someone came to her house and took care of this for her.

## 2013-08-27 NOTE — Telephone Encounter (Signed)
Patient calling to inform [name]. To keep the [employer] form for now, she will get more information and call us back.

## 2013-08-28 NOTE — Telephone Encounter (Signed)
Pt's information was given to [name]., CMA and Hinton Dyer has already taken care of this matter.

## 2013-09-16 NOTE — Telephone Encounter (Signed)
Noted  

## 2013-09-28 DIAGNOSIS — I10 Essential (primary) hypertension: Secondary | ICD-10-CM | POA: Diagnosis not present

## 2013-09-28 DIAGNOSIS — Z905 Acquired absence of kidney: Secondary | ICD-10-CM | POA: Diagnosis not present

## 2013-09-28 DIAGNOSIS — R809 Proteinuria, unspecified: Secondary | ICD-10-CM | POA: Diagnosis not present

## 2013-09-28 DIAGNOSIS — E559 Vitamin D deficiency, unspecified: Secondary | ICD-10-CM | POA: Diagnosis not present

## 2013-09-28 DIAGNOSIS — E119 Type 2 diabetes mellitus without complications: Secondary | ICD-10-CM | POA: Diagnosis not present

## 2013-10-22 DIAGNOSIS — M25569 Pain in unspecified knee: Secondary | ICD-10-CM | POA: Diagnosis not present

## 2013-10-22 DIAGNOSIS — M25579 Pain in unspecified ankle and joints of unspecified foot: Secondary | ICD-10-CM | POA: Diagnosis not present

## 2013-10-22 DIAGNOSIS — S93609A Unspecified sprain of unspecified foot, initial encounter: Secondary | ICD-10-CM | POA: Diagnosis not present

## 2013-10-28 ENCOUNTER — Other Ambulatory Visit: Payer: Self-pay | Admitting: Family Medicine

## 2013-11-12 ENCOUNTER — Other Ambulatory Visit: Payer: Self-pay | Admitting: Neurology

## 2013-11-12 ENCOUNTER — Other Ambulatory Visit: Payer: Self-pay | Admitting: Nurse Practitioner

## 2013-11-21 ENCOUNTER — Other Ambulatory Visit: Payer: Self-pay | Admitting: Family Medicine

## 2013-12-03 ENCOUNTER — Encounter: Payer: Self-pay | Admitting: Family Medicine

## 2013-12-03 NOTE — Telephone Encounter (Signed)
This patient has not been seen since June of 2014. Please advise     KP

## 2013-12-05 ENCOUNTER — Other Ambulatory Visit: Payer: Self-pay | Admitting: Neurology

## 2013-12-11 ENCOUNTER — Ambulatory Visit (HOSPITAL_COMMUNITY)
Admission: RE | Admit: 2013-12-11 | Discharge: 2013-12-11 | Disposition: A | Discharge status: Home / Self Care | Payer: Medicare Other | Source: Ambulatory Visit | Attending: Diagnostic Radiology | Admitting: Diagnostic Radiology

## 2013-12-11 ENCOUNTER — Other Ambulatory Visit (HOSPITAL_COMMUNITY): Payer: Self-pay | Admitting: Urology

## 2013-12-11 DIAGNOSIS — Z905 Acquired absence of kidney: Secondary | ICD-10-CM | POA: Diagnosis not present

## 2013-12-11 DIAGNOSIS — I10 Essential (primary) hypertension: Secondary | ICD-10-CM | POA: Insufficient documentation

## 2013-12-11 DIAGNOSIS — D3500 Benign neoplasm of unspecified adrenal gland: Secondary | ICD-10-CM | POA: Diagnosis not present

## 2013-12-11 DIAGNOSIS — C649 Malignant neoplasm of unspecified kidney, except renal pelvis: Secondary | ICD-10-CM | POA: Insufficient documentation

## 2013-12-11 DIAGNOSIS — R931 Abnormal findings on diagnostic imaging of heart and coronary circulation: Secondary | ICD-10-CM | POA: Diagnosis not present

## 2013-12-11 DIAGNOSIS — Z87891 Personal history of nicotine dependence: Secondary | ICD-10-CM | POA: Insufficient documentation

## 2013-12-16 DIAGNOSIS — C678 Malignant neoplasm of overlapping sites of bladder: Secondary | ICD-10-CM | POA: Diagnosis not present

## 2013-12-16 DIAGNOSIS — C649 Malignant neoplasm of unspecified kidney, except renal pelvis: Secondary | ICD-10-CM | POA: Diagnosis not present

## 2013-12-23 ENCOUNTER — Other Ambulatory Visit: Payer: Self-pay | Admitting: Family Medicine

## 2013-12-24 ENCOUNTER — Encounter: Payer: Self-pay | Admitting: Neurology

## 2013-12-24 ENCOUNTER — Telehealth: Payer: Self-pay | Admitting: *Deleted

## 2013-12-24 NOTE — Telephone Encounter (Signed)
I LMVM for pt to return call about sucutaneous IVIG therapy (try insurance coverage again).

## 2013-12-31 ENCOUNTER — Telehealth: Payer: Self-pay

## 2013-12-31 ENCOUNTER — Ambulatory Visit (INDEPENDENT_AMBULATORY_CARE_PROVIDER_SITE_OTHER): Payer: Medicare Other | Admitting: Family Medicine

## 2013-12-31 ENCOUNTER — Encounter: Payer: Self-pay | Admitting: Family Medicine

## 2013-12-31 VITALS — BP 160/90 | HR 88 | Temp 99.4°F | Wt 198.2 lb

## 2013-12-31 DIAGNOSIS — E1165 Type 2 diabetes mellitus with hyperglycemia: Secondary | ICD-10-CM

## 2013-12-31 DIAGNOSIS — R110 Nausea: Secondary | ICD-10-CM | POA: Diagnosis not present

## 2013-12-31 DIAGNOSIS — E785 Hyperlipidemia, unspecified: Secondary | ICD-10-CM | POA: Diagnosis not present

## 2013-12-31 DIAGNOSIS — R140 Abdominal distension (gaseous): Secondary | ICD-10-CM

## 2013-12-31 DIAGNOSIS — IMO0002 Reserved for concepts with insufficient information to code with codable children: Secondary | ICD-10-CM

## 2013-12-31 LAB — HEPATIC FUNCTION PANEL
ALBUMIN: 4.1 g/dL (ref 3.5–5.2)
ALT: 23 U/L (ref 0–35)
AST: 21 U/L (ref 0–37)
Alkaline Phosphatase: 88 U/L (ref 39–117)
Bilirubin, Direct: 0 mg/dL (ref 0.0–0.3)
Total Bilirubin: 0.6 mg/dL (ref 0.2–1.2)
Total Protein: 7.3 g/dL (ref 6.0–8.3)

## 2013-12-31 LAB — POCT URINALYSIS DIPSTICK
Bilirubin, UA: NEGATIVE
Blood, UA: NEGATIVE
KETONES UA: NEGATIVE
LEUKOCYTES UA: NEGATIVE
Nitrite, UA: NEGATIVE
Spec Grav, UA: >=1.03
Urobilinogen, UA: 0.2
pH, UA: 5

## 2013-12-31 LAB — LIPID PANEL
Cholesterol: 315 mg/dL — ABNORMAL HIGH (ref 0–200)
HDL: 58.2 mg/dL (ref >39.00)
NonHDL: 256.8
TRIGLYCERIDES: 388 mg/dL — AB (ref 0.0–149.0)
Total CHOL/HDL Ratio: 5
VLDL: 77.6 mg/dL — ABNORMAL HIGH (ref 0.0–40.0)

## 2013-12-31 LAB — BASIC METABOLIC PANEL
BUN: 22 mg/dL (ref 6–23)
CO2: 27 mEq/L (ref 19–32)
CREATININE: 1.2 mg/dL (ref 0.4–1.2)
Calcium: 9.7 mg/dL (ref 8.4–10.5)
Chloride: 98 mEq/L (ref 96–112)
GFR: 49.76 mL/min — AB (ref >60.00)
GLUCOSE: 314 mg/dL — AB (ref 70–99)
Potassium: 4.1 mEq/L (ref 3.5–5.1)
Sodium: 136 mEq/L (ref 135–145)

## 2013-12-31 LAB — HEMOGLOBIN A1C: Hgb A1c MFr Bld: 10 % — ABNORMAL HIGH (ref 4.6–6.5)

## 2013-12-31 MED ORDER — EMPAGLIFLOZIN 10 MG PO TABS: 10.0000 mg | ORAL_TABLET | Freq: Every day | ORAL | Status: DC

## 2013-12-31 MED ORDER — METOCLOPRAMIDE HCL 10 MG PO TABS: 10.0000 mg | ORAL_TABLET | Freq: Three times a day (TID) | ORAL | Status: DC

## 2013-12-31 NOTE — Telephone Encounter (Signed)
I called pt and spoke to her about trying to get her subQ IVIG covered by insurance.    She has Medicare AB and [employer] for medications.  Not sure we tried with [place], [id number] CS # ID [id number]  RX bin [phone].  Coverage determination [phone], this # is also customer service.  Pharmacy [phone].  Will see thru [facility] if approves.   May need earlier appt for face to face since last seen 05-2013.

## 2013-12-31 NOTE — Telephone Encounter (Signed)
patient wanted to know if she could have something other than Metformin for her diabetes because it is causing Diarrhea. Please advise     KP

## 2013-12-31 NOTE — Progress Notes (Signed)
Pre visit review using our clinic review tool, if applicable. No additional management support is needed unless otherwise documented below in the visit note. 

## 2013-12-31 NOTE — Patient Instructions (Signed)
Diabetes and Standards of Medical Care Diabetes is complicated. You may find that your diabetes team includes a dietitian, nurse, diabetes educator, eye doctor, and more. To help everyone know what is going on and to help you get the care you deserve, the following schedule of care was developed to help keep you on track. Below are the tests, exams, vaccines, medicines, education, and plans you will need. HbA1c test This test shows how well you have controlled your glucose over the past 2-3 months. It is used to see if your diabetes management plan needs to be adjusted.   It is performed at least 2 times a year if you are meeting treatment goals.  It is performed 4 times a year if therapy has changed or if you are not meeting treatment goals. Blood pressure test  This test is performed at every routine medical visit. The goal is less than 140/90 mm Hg for most people, but 130/80 mm Hg in some cases. Ask your health care provider about your goal. Dental exam  Follow up with the dentist regularly. Eye exam  If you are diagnosed with type 1 diabetes as a child, get an exam upon reaching the age of 37 years or older and have had diabetes for 3-5 years. Yearly eye exams are recommended after that initial eye exam.  If you are diagnosed with type 1 diabetes as an adult, get an exam within 5 years of diagnosis and then yearly.  If you are diagnosed with type 2 diabetes, get an exam as soon as possible after the diagnosis and then yearly. Foot care exam  Visual foot exams are performed at every routine medical visit. The exams check for cuts, injuries, or other problems with the feet.  A comprehensive foot exam should be done yearly. This includes visual inspection as well as assessing foot pulses and testing for loss of sensation.  Check your feet nightly for cuts, injuries, or other problems with your feet. Tell your health care provider if anything is not healing. Kidney function test (urine  microalbumin)  This test is performed once a year.  Type 1 diabetes: The first test is performed 5 years after diagnosis.  Type 2 diabetes: The first test is performed at the time of diagnosis.  A serum creatinine and estimated glomerular filtration rate (eGFR) test is done once a year to assess the level of chronic kidney disease (CKD), if present. Lipid profile (cholesterol, HDL, LDL, triglycerides)  Performed every 5 years for most people.  The goal for LDL is less than 100 mg/dL. If you are at high risk, the goal is less than 70 mg/dL.  The goal for HDL is 40 mg/dL-50 mg/dL for men and 50 mg/dL-60 mg/dL for women. An HDL cholesterol of 60 mg/dL or higher gives some protection against heart disease.  The goal for triglycerides is less than 150 mg/dL. Influenza vaccine, pneumococcal vaccine, and hepatitis B vaccine  The influenza vaccine is recommended yearly.  It is recommended that people with diabetes who are over 24 years old get the pneumonia vaccine. In some cases, two separate shots may be given. Ask your health care provider if your pneumonia vaccination is up to date.  The hepatitis B vaccine is also recommended for adults with diabetes. Diabetes self-management education  Education is recommended at diagnosis and ongoing as needed. Treatment plan  Your treatment plan is reviewed at every medical visit. Document Released: 11/26/2008 Document Revised: 06/15/2013 Document Reviewed: 07/01/2012 [facility] Patient Information 2015 [place],  [employer]. This information is not intended to replace advice given to you by your health care provider. Make sure you discuss any questions you have with your health care provider.

## 2013-12-31 NOTE — Telephone Encounter (Signed)
jardiance 10 mg 1 po qam #30  2 refills

## 2013-12-31 NOTE — Progress Notes (Signed)
   Subjective:    Patient ID: Nicole Strong, female    DOB: [birth year], 63 y.o.   MRN: [id number]  HPI Pt here c/o Nv and bloating.  She also is requesting to change the metformin secondary to loose stools. Pt is requesting to see Dr Earlean Shawl.  Review of Systems As above    Objective:   Physical Exam  BP 160/90 mmHg  Pulse 88  Temp(Src) 99.4 F (37.4 C) (Oral)  Wt 198 lb 3.2 oz (89.903 kg)  SpO2 94% General appearance: alert, cooperative, appears stated age and no distress Neck: no adenopathy, no carotid bruit, no JVD, supple, symmetrical, trachea midline and thyroid not enlarged, symmetric, no tenderness/mass/nodules Lungs: clear to auscultation bilaterally Heart: S1, S2 normal Abdomen: soft, non-tender; bowel sounds normal; no masses,  no organomegaly Extremities: extremities normal, atraumatic, no cyanosis or edema      Assessment & Plan:  1. Nausea without vomiting   - Ambulatory referral to Gastroenterology - metoCLOPramide (REGLAN) 10 MG tablet; Take 1 tablet (10 mg total) by mouth 4 (four) times daily -  before meals and at bedtime.  Dispense: 120 tablet; Refill: 2  2. Bloating   - Ambulatory referral to Gastroenterology - metoCLOPramide (REGLAN) 10 MG tablet; Take 1 tablet (10 mg total) by mouth 4 (four) times daily -  before meals and at bedtime.  Dispense: 120 tablet; Refill: 2  3. Diabetes mellitus type II, uncontrolled Pt requested to change metformin----try jardiance instead Recheck 3 months - Basic metabolic panel - Hemoglobin A1c - Microalbumin / creatinine urine ratio - POCT urinalysis dipstick - Ambulatory referral to Endocrinology  4. Hyperlipidemia LDL goal <70 Check labs - Hepatic function panel - Lipid panel

## 2013-12-31 NOTE — Telephone Encounter (Signed)
Rx faxed to the pharmacy and a detailed message has been left for the patient.      KP

## 2014-01-01 ENCOUNTER — Other Ambulatory Visit: Payer: Self-pay | Admitting: Family Medicine

## 2014-01-01 ENCOUNTER — Encounter: Payer: Self-pay | Admitting: Family Medicine

## 2014-01-01 DIAGNOSIS — IMO0002 Reserved for concepts with insufficient information to code with codable children: Secondary | ICD-10-CM

## 2014-01-01 DIAGNOSIS — E1165 Type 2 diabetes mellitus with hyperglycemia: Secondary | ICD-10-CM

## 2014-01-01 LAB — LDL CHOLESTEROL, DIRECT: LDL DIRECT: 210.4 mg/dL

## 2014-01-01 LAB — MICROALBUMIN / CREATININE URINE RATIO
Creatinine,U: 148.5 mg/dL
Microalb Creat Ratio: 332.2 mg/g — ABNORMAL HIGH (ref 0.0–30.0)
Microalb, Ur: 493.4 mg/dL — ABNORMAL HIGH (ref 0.0–1.9)

## 2014-01-01 MED ORDER — LINAGLIPTIN 5 MG PO TABS: 5.0000 mg | ORAL_TABLET | Freq: Every day | ORAL | Status: DC

## 2014-01-01 NOTE — Telephone Encounter (Signed)
tradjenta 5 mg 1 po qd  #30  2 refills

## 2014-01-02 ENCOUNTER — Other Ambulatory Visit: Payer: Self-pay | Admitting: Family Medicine

## 2014-01-02 ENCOUNTER — Encounter: Payer: Self-pay | Admitting: Family Medicine

## 2014-01-04 ENCOUNTER — Other Ambulatory Visit: Payer: Self-pay | Admitting: Family Medicine

## 2014-01-04 DIAGNOSIS — E1165 Type 2 diabetes mellitus with hyperglycemia: Principal | ICD-10-CM

## 2014-01-04 DIAGNOSIS — E1121 Type 2 diabetes mellitus with diabetic nephropathy: Secondary | ICD-10-CM

## 2014-01-04 DIAGNOSIS — IMO0002 Reserved for concepts with insufficient information to code with codable children: Secondary | ICD-10-CM

## 2014-01-04 MED ORDER — NATEGLINIDE 60 MG PO TABS: 60.0000 mg | ORAL_TABLET | Freq: Three times a day (TID) | ORAL | Status: DC

## 2014-01-06 ENCOUNTER — Other Ambulatory Visit: Payer: Self-pay

## 2014-01-06 ENCOUNTER — Encounter: Payer: Self-pay | Admitting: Family Medicine

## 2014-01-06 MED ORDER — SIMVASTATIN 40 MG PO TABS: 40.0000 mg | ORAL_TABLET | Freq: Every day | ORAL | Status: DC

## 2014-01-06 MED ORDER — SIMVASTATIN 20 MG PO TABS: 20.0000 mg | ORAL_TABLET | Freq: Every day | ORAL | Status: DC

## 2014-01-21 DIAGNOSIS — M25552 Pain in left hip: Secondary | ICD-10-CM | POA: Diagnosis not present

## 2014-01-21 DIAGNOSIS — M5136 Other intervertebral disc degeneration, lumbar region: Secondary | ICD-10-CM | POA: Diagnosis not present

## 2014-01-21 NOTE — Telephone Encounter (Signed)
Sent message to [employer]. At [facility] re:  insurance and coverage for Hizentra.  She stated would look into.

## 2014-01-25 ENCOUNTER — Ambulatory Visit (INDEPENDENT_AMBULATORY_CARE_PROVIDER_SITE_OTHER): Payer: Medicare Other | Admitting: Internal Medicine

## 2014-01-25 ENCOUNTER — Encounter: Payer: Self-pay | Admitting: Internal Medicine

## 2014-01-25 ENCOUNTER — Other Ambulatory Visit: Payer: Self-pay | Admitting: Family Medicine

## 2014-01-25 VITALS — BP 140/88 | HR 83 | Temp 98.3°F | Resp 12 | Ht 61.0 in | Wt 201.0 lb

## 2014-01-25 DIAGNOSIS — E1129 Type 2 diabetes mellitus with other diabetic kidney complication: Secondary | ICD-10-CM

## 2014-01-25 DIAGNOSIS — E1165 Type 2 diabetes mellitus with hyperglycemia: Secondary | ICD-10-CM

## 2014-01-25 DIAGNOSIS — IMO0002 Reserved for concepts with insufficient information to code with codable children: Secondary | ICD-10-CM

## 2014-01-25 MED ORDER — NATEGLINIDE 120 MG PO TABS: 120.0000 mg | ORAL_TABLET | Freq: Three times a day (TID) | ORAL | Status: DC

## 2014-01-25 MED ORDER — METFORMIN HCL ER 500 MG PO TB24: 500.0000 mg | ORAL_TABLET | Freq: Every day | ORAL | Status: DC

## 2014-01-25 NOTE — Patient Instructions (Signed)
Please increase Starlix to 120 mg 3x a day at starting of the meals. Please start Metformin XR 500 mg with dinner x 4 days. If you tolerate this well, add another Metformin tablet (500 mg) with breakfast. Continue with 500 mg of metformin 2x a day with breakfast and dinner.  Please return in 1 month with your sugar log.    PATIENT INSTRUCTIONS FOR TYPE 2 DIABETES:  **Please join MyChart!** - see attached instructions about how to join if you have not done so already.  DIET AND EXERCISE Diet and exercise is an important part of diabetic treatment.  We recommended aerobic exercise in the form of brisk walking (working between 40-60% of maximal aerobic capacity, similar to brisk walking) for 150 minutes per week (such as 30 minutes five days per week) along with 3 times per week performing 'resistance' training (using various gauge rubber tubes with handles) 5-10 exercises involving the major muscle groups (upper body, lower body and core) performing 10-15 repetitions (or near fatigue) each exercise. Start at half the above goal but build slowly to reach the above goals. If limited by weight, joint pain, or disability, we recommend daily walking in a swimming pool with water up to waist to reduce pressure from joints while allow for adequate exercise.    BLOOD GLUCOSES Monitoring your blood glucoses is important for continued management of your diabetes. Please check your blood glucoses 2-4 times a day: fasting, before meals and at bedtime (you can rotate these measurements - e.g. one day check before the 3 meals, the next day check before 2 of the meals and before bedtime, etc.).   HYPOGLYCEMIA (low blood sugar) Hypoglycemia is usually a reaction to not eating, exercising, or taking too much insulin/ other diabetes drugs.  Symptoms include tremors, sweating, hunger, confusion, headache, etc. Treat IMMEDIATELY with 15 grams of Carbs: . 4 glucose tablets .  cup regular juice/soda . 2 tablespoons  raisins . 4 teaspoons sugar . 1 tablespoon honey Recheck blood glucose in 15 mins and repeat above if still symptomatic/blood glucose <100.  RECOMMENDATIONS TO REDUCE YOUR RISK OF DIABETIC COMPLICATIONS: * Take your prescribed MEDICATION(S) * Follow a DIABETIC diet: Complex carbs, fiber rich foods, (monounsaturated and polyunsaturated) fats * AVOID saturated/trans fats, high fat foods, >2,300 mg salt per day. * EXERCISE at least 5 times a week for 30 minutes or preferably daily.  * DO NOT SMOKE OR DRINK more than 1 drink a day. * Check your FEET every day. Do not wear tightfitting shoes. Contact us if you develop an ulcer * See your EYE doctor once a year or more if needed * Get a FLU shot once a year * Get a PNEUMONIA vaccine once before and once after age 55 years  GOALS:  * Your Hemoglobin A1c of <7%  * fasting sugars need to be <130 * after meals sugars need to be <180 (2h after you start eating) * Your Systolic BP should be [id number] or lower  * Your Diastolic BP should be 80 or lower  * Your HDL (Good Cholesterol) should be 40 or higher  * Your LDL (Bad Cholesterol) should be 100 or lower. * Your Triglycerides should be 150 or lower  * Your Urine microalbumin (kidney function) should be <30 * Your Body Mass Index should be 25 or lower    Please consider the following ways to cut down carbs and fat and increase fiber and micronutrients in your diet: - substitute whole grain for white bread  or pasta - substitute brown rice for white rice - substitute 90-calorie flat bread pieces for slices of bread when possible - substitute sweet potatoes or yams for white potatoes - substitute humus for margarine - substitute tofu for cheese when possible - substitute almond or rice milk for regular milk (would not drink soy milk daily due to concern for soy estrogen influence on breast cancer risk) - substitute dark chocolate for other sweets when possible - substitute water - can add lemon or  orange slices for taste - for diet sodas (artificial sweeteners will trick your body that you can eat sweets without getting calories and will lead you to overeating and weight gain in the long run) - do not skip breakfast or other meals (this will slow down the metabolism and will result in more weight gain over time)  - can try smoothies made from fruit and almond/rice milk in am instead of regular breakfast - can also try old-fashioned (not instant) oatmeal made with almond/rice milk in am - order the dressing on the side when eating salad at a restaurant (pour less than half of the dressing on the salad) - eat as little meat as possible - can try juicing, but should not forget that juicing will get rid of the fiber, so would alternate with eating raw veg./fruits or drinking smoothies - use as little oil as possible, even when using olive oil - can dress a salad with a mix of balsamic vinegar and lemon juice, for e.g. - use agave nectar, stevia sugar, or regular sugar rather than artificial sweateners - steam or broil/roast veggies  - snack on veggies/fruit/nuts (unsalted, preferably) when possible, rather than processed foods - reduce or eliminate aspartame in diet (it is in diet sodas, chewing gum, etc) Read the labels!  Try to read Dr. Janene Harvey book: "Program for Reversing Diabetes" for other ideas for healthy eating.

## 2014-01-25 NOTE — Progress Notes (Signed)
Patient ID: Nicole Strong, female   DOB: 30-Jan-1951, 63 y.o.   MRN: [id number]  HPI: Nicole Strong is a 63 y.o.-year-old female, referred by her PCP, Dr. Etter Sjogren, for management of DM2, dx in ~2009, non-insulin-dependent, uncontrolled, with complications (CKD).  Last hemoglobin A1c was: Lab Results  Component Value Date   HGBA1C 10.0* 12/31/2013   HGBA1C 11.3* 08/14/2013   HGBA1C 9.6* 07/24/2012   Pt is on a regimen of: - Starlix 60 mg 2x a day before meals (brunch and dinner) She was on Starlix >> felt well on this, but did not restart She had Metformin >> diarrhea >> had to stop it She tried Januvia >> had diarrhea from it. She was started on Tradjenta >> could not afford it She was on Wechol 1000 mg po bid >> could not afford it She was on Actos >> sugars better, but had bladder cancer  Pt checks her sugars 1x a day and they are (no log) - after starting Starlix, CBGs 300s >> 200s: - am: low 200s - 2h after b'fast: n/c - before lunch: low 200s - 2h after lunch: n/c - before dinner: low 200s - 2h after dinner: n/c - bedtime: n/c - nighttime: n/c No lows. Lowest sugar was 200; she has hypoglycemia awareness at 200.  Highest sugar was 340.  Glucometer: Freestyle Lite  Pt's meals are: - Breakfast: glucerna, may skip, b/c gastroparesis - Lunch >> mostly brunch: 1/2 sandwich - Dinner: salad or TV dinner or egg + English muffin - Snacks: cheese, PB crackers No sodas  - + CKD, last BUN/creatinine:  Lab Results  Component Value Date   BUN 22 12/31/2013   CREATININE 1.2 12/31/2013  She had L nephrectomy 2011. - last set of lipids: Lab Results  Component Value Date   CHOL 315* 12/31/2013   HDL 58.20 12/31/2013   LDLCALC * 05/19/2010    101        Total Cholesterol/HDL:CHD Risk Coronary Heart Disease Risk Table                     Men   Women  1/2 Average Risk   3.4   3.3  Average Risk       5.0   4.4  2 X Average Risk   9.6   7.1  3 X Average Risk  23.4   11.0         Use the calculated Patient Ratio above and the CHD Risk Table to determine the patient's CHD Risk.        ATP III CLASSIFICATION (LDL):  <100     mg/dL   Optimal  100-129  mg/dL   Near or Above                    Optimal  130-159  mg/dL   Borderline  160-189  mg/dL   High  >190     mg/dL   Very High   LDLDIRECT 210.4 12/31/2013   TRIG 388.0* 12/31/2013   CHOLHDL 5 12/31/2013  On Zocor. - last eye exam was in 03/2013. No DR. Has double vision from GBS. - + numbness and tingling in her feet. - she hast gastroparesis but did not start Reglan.  Pt has FH of DM in MGF.  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  ROS: Constitutional: no weight gain/loss, + fatigue, + subjective hypothermia, + poor sleep Eyes: no blurry vision, no  xerophthalmia ENT: no sore throat, no nodules palpated in throat, no dysphagia/odynophagia, no hoarseness Cardiovascular: no CP/+ SOB/no palpitations/+ leg swelling Respiratory: no cough/+ SOB Gastrointestinal: + all: N/V/D/C/acid reflux Musculoskeletal: no muscle/joint aches/+ joint swelling Skin: no rashes, + easy bruising, + hair loss Neurological: no tremors/numbness/tingling/dizziness, + HA Psychiatric: no depression/anxiety  Past Medical History  Diagnosis Date  . Diabetes mellitus     2  . Hyperlipidemia   . Hypertension   . Renal mass     BEING WORKED UP BY UROLOGY  . Chronic inflammatory demyelinating polyneuritis   . Guillain-Barre syndrome   . Renal disorder     L kidney removed  . Cancer   . 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 Cesarean section    . Cholecystectomy    . Nephrectomy     History   Social History  . Marital Status: Divorced    Spouse Name: N/A    Number of Children: 1  . Years of Education: 14   Occupational History  .      disabled   Social History Main Topics  . Smoking status: Former Research scientist (life sciences)  . Smokeless  tobacco: Never Used  . Alcohol Use: No  . Drug Use: No   Social History Narrative   Patient lives alone.    Disabled.   Education college education.   Right handed.   Caffeine. Rare one cup.   Current Outpatient Prescriptions on File Prior to Visit  Medication Sig Dispense Refill  . amitriptyline (ELAVIL) 10 MG tablet TAKE 4 TABS AT BEDTIME  FOR 1 WEEK THEN INCREASE TO 5 AT BEDTIME IF NECESSARY 150 tablet 3  . aspirin EC 81 MG tablet Take 81 mg by mouth daily.    . calcium citrate (CALCITRATE - DOSED IN MG ELEMENTAL CALCIUM) 950 MG tablet Take 200 mg of elemental calcium by mouth daily.    . cholecalciferol (VITAMIN D) 1000 UNITS tablet Take 5,000 Units by mouth daily.    . COD LIVER OIL PO Take 1 capsule by mouth daily.    Marland Kitchen gabapentin (NEURONTIN) 300 MG capsule TAKE 1 CAPSULE (300 MG TOTAL) BY MOUTH 4 (FOUR) TIMES DAILY. 120 capsule 3  . linagliptin (TRADJENTA) 5 MG TABS tablet Take 1 tablet (5 mg total) by mouth daily. 30 tablet 2  . lisinopril (PRINIVIL,ZESTRIL) 20 MG tablet Take 20 mg by mouth daily.    . metFORMIN (GLUCOPHAGE) 1000 MG tablet TAKE 1 TABLET (1,000 MG TOTAL) BY MOUTH 2 (TWO) TIMES DAILY WITH A MEAL. OFFICE VISIT DUE FOR MORE REFILLS. 60 tablet 0  . metoCLOPramide (REGLAN) 10 MG tablet Take 1 tablet (10 mg total) by mouth 4 (four) times daily -  before meals and at bedtime. 120 tablet 2  . nateglinide (STARLIX) 60 MG tablet Take 1 tablet (60 mg total) by mouth 3 (three) times daily with meals. 90 tablet 2  . promethazine (PHENERGAN) 25 MG tablet TAKE 1 TABLET (25 MG TOTAL) BY MOUTH AS NEEDED as directed by dr dohmeier  FOR NAUSEA. 90 tablet 1  . simvastatin (ZOCOR) 40 MG tablet Take 1 tablet (40 mg total) by mouth at bedtime. 30 tablet 2  . traMADol (ULTRAM) 50 MG tablet Take 50-100 mg by mouth every 6 (six) hours as needed for moderate pain. Take 1 tablet in the morning and 2 tablets at night     No current facility-administered medications on file  prior to visit.    Allergies  Allergen Reactions  . Biaxin [Clarithromycin] Other (See Comments)    "face turns red"  . Codeine Nausea And Vomiting and Other (See Comments)    Hallucinations  . Erythromycin Nausea And Vomiting   Family History  Problem Relation Age of Onset  . Cancer Father     PROSTATE  . Cancer Sister     BREAST  . Healthy Mother    PE: BP 140/88 mmHg  Pulse 83  Temp(Src) 98.3 F (36.8 C) (Oral)  Resp 12  Ht 5\' 1"  (1.549 m)  Wt 201 lb (91.173 kg)  BMI 38.00 kg/m2  SpO2 96% Wt Readings from Last 3 Encounters:  01/25/14 201 lb (91.173 kg)  12/31/13 198 lb 3.2 oz (89.903 kg)  08/14/13 200 lb (90.719 kg)   Constitutional: overweight, in NAD, in wheelchair Eyes: PERRLA, EOMI, no exophthalmos ENT: moist mucous membranes, no thyromegaly, no cervical lymphadenopathy Cardiovascular: RRR, No MRG Respiratory: CTA B Gastrointestinal: abdomen soft, NT, ND, BS+ Musculoskeletal: no deformities, strength intact in all 4 Skin: moist, warm, no rashes Neurological: no tremor with outstretched hands, DTR not elicited  ASSESSMENT: 1. DM2, non-insulin-dependent, uncontrolled, with complications - CKD  PLAN:  1. Patient with long-standing, uncontrolled diabetes, on oral antidiabetic regimen, which became insufficient. She is not taking Starlix with Glucerna in am (has a large can!) >> advised to do so. I will also increase her Starlix dose. We discussed to aslo add Metformin XR at half-maximal dose, in an attempt to avoid basal insulin. We are limited in terms of her DM tx by the following factors: - has M'care - has gastroparesis - has CKD - was intolerant to several meds in the past - We discussed about options for treatment, and I suggested to:  Patient Instructions  Please increase Starlix to 120 mg 3x a day at starting of the meals. Please start Metformin XR 500 mg with dinner x 4 days. If you tolerate this well, add another Metformin tablet (500 mg) with breakfast. Continue  with 500 mg of metformin 2x a day with breakfast and dinner.  Please return in 1 month with your sugar log.  - also, given more dietary suggestions (see pt instr.) - Strongly advised her to start checking sugars at different times of the day - check 2 times a day, rotating checks - given sugar log and advised how to fill it and to bring it at next appt  - given foot care handout and explained the principles  - given instructions for hypoglycemia management "15-15 rule"  - advised for yearly eye exams - Return to clinic in 1 mo with sugar log

## 2014-01-26 ENCOUNTER — Other Ambulatory Visit: Payer: Self-pay | Admitting: *Deleted

## 2014-01-26 ENCOUNTER — Telehealth: Payer: Self-pay | Admitting: Internal Medicine

## 2014-01-26 MED ORDER — GLUCOSE BLOOD VI STRP: ORAL_STRIP | Status: DC

## 2014-01-26 NOTE — Telephone Encounter (Signed)
Opened encounter in error  

## 2014-01-26 NOTE — Telephone Encounter (Signed)
Called pt to ask which meter she had. Pt has a Freestyle Lite. Refill sent to pharmacy.

## 2014-01-26 NOTE — Telephone Encounter (Signed)
Patient need a prescription for  Free style glucose test strips (medicare b)

## 2014-01-27 DIAGNOSIS — R300 Dysuria: Secondary | ICD-10-CM | POA: Diagnosis not present

## 2014-01-27 DIAGNOSIS — N390 Urinary tract infection, site not specified: Secondary | ICD-10-CM | POA: Diagnosis not present

## 2014-01-28 NOTE — Telephone Encounter (Signed)
Received message that MD office will have to call and see if pt can get B versus D determination.  Pt has appt 02-10-14, may wait for face to face, since last seen 05/2013.

## 2014-02-01 ENCOUNTER — Encounter: Payer: Self-pay | Admitting: Internal Medicine

## 2014-02-02 ENCOUNTER — Encounter: Payer: Self-pay | Admitting: *Deleted

## 2014-02-02 ENCOUNTER — Telehealth: Payer: Self-pay | Admitting: *Deleted

## 2014-02-02 NOTE — Telephone Encounter (Signed)
Advised pt. Pt will let us know if there are any increases in her bg.

## 2014-02-02 NOTE — Telephone Encounter (Signed)
Nicole Strong, please advise her to stay with the Metformin XR dose that she tolerates. Also, fatty foods will increase her chances of GI sxs with Metformin, so she should reduce them.  Yes, steroids will increase her sugars, so better to be avoided, but if she absolutely needs them, she needs to monitor sugars frequently and let me know if they increase significantly.

## 2014-02-02 NOTE — Telephone Encounter (Signed)
Please read note below. Pt sent through [facility]. I responded to the last part. Please advise.   I think I was supposed to report back about the Slow release Metformin. I started out taking one pill daily and did that 4-5 days then tried taking 1 am and 1 pm. I am having some diarrhea.  So far it hasn't been as bad like with the regular metformin but it's still diarrhea. Maybe something I ate but kinda don't think so.  My sugar has started to come down though Not enough yet but it did finally go below 200!  I am keeping track of it. I need to get into a routine so it will become automatic.  Another quick question Can I take a Prednisone Dospak or will it cause my glucose level go up too much?   Have a Merry Christmas!  Nicole Strong

## 2014-02-06 ENCOUNTER — Other Ambulatory Visit: Payer: Self-pay | Admitting: Neurology

## 2014-02-10 ENCOUNTER — Telehealth: Payer: Self-pay | Admitting: Neurology

## 2014-02-10 ENCOUNTER — Encounter: Payer: Self-pay | Admitting: Neurology

## 2014-02-10 ENCOUNTER — Ambulatory Visit: Payer: Self-pay | Admitting: Nurse Practitioner

## 2014-02-10 ENCOUNTER — Ambulatory Visit (INDEPENDENT_AMBULATORY_CARE_PROVIDER_SITE_OTHER): Payer: Medicare Other | Admitting: Neurology

## 2014-02-10 VITALS — BP 149/76 | HR 85 | Resp 14 | Ht 61.0 in | Wt 200.0 lb

## 2014-02-10 DIAGNOSIS — R269 Unspecified abnormalities of gait and mobility: Secondary | ICD-10-CM

## 2014-02-10 DIAGNOSIS — G4701 Insomnia due to medical condition: Secondary | ICD-10-CM

## 2014-02-10 DIAGNOSIS — G6181 Chronic inflammatory demyelinating polyneuritis: Secondary | ICD-10-CM | POA: Diagnosis not present

## 2014-02-10 MED ORDER — GABAPENTIN 300 MG PO CAPS: ORAL_CAPSULE | ORAL | Status: DC

## 2014-02-10 MED ORDER — TRAMADOL HCL 50 MG PO TABS: 50.0000 mg | ORAL_TABLET | Freq: Four times a day (QID) | ORAL | Status: DC | PRN

## 2014-02-10 MED ORDER — IMMUNE GLOBULIN (HUMAN) 1 GM/5ML ~~LOC~~ SOLN: SUBCUTANEOUS | Status: DC

## 2014-02-10 NOTE — Patient Instructions (Signed)

## 2014-02-10 NOTE — Progress Notes (Signed)
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 She was last seen in this office by Dr. Dohmeier10/8/14. At that time she was told about a subcutaneous injection Hizentra but Medicare denied this. She complains of leg pain and has ordered a TENS unit from [employer]. She is not sure how to use the unit. She had previously been asked to go to physical therapy. She takes Neurontin for pain as well as amitriptyline at night. She is asking for an increase in her amitriptyline. She is seated in a rolling walker today. She also has a regular walker that she uses in her home. She is living in a prone able to transfer to the toilet bed and in the dining room chair she also shower bench for showering. She can ambulate for transfer . She can push her wheelchair as if a walker.  No bronchitis and colds recently -She had lost deep tendon reflexes throughout her 4 extremities. She has not recovered any. 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  She  lost one kidney to renal cancer in 2011. Prednisone caused mania , euphoria, severe. IV IG was not considered an option IV due to the burden on the kidney.  The patient has been seen last by me and November 2013 and in April of this year was seen by Windle Guard. She has rather severe residual symptoms and had 5 plasmapheresis treatments but felt no improvement for her symptoms after about. (Had ascended to the upper extremities , milder breathing difficulties she had some little shortness of breath at the time but Milder upper extremity weakness. She [facility] and  now is back since 07-2010 in her private home without in-home care.  The course from acute IDP to CIDP was 8 month in duration , 2011.  A total of 5 plasmapheresis treatments was applied, 3 times . Last in March 2014 . The patient reports that she can take a couple of steps which is an improvement over April when she could stand but could not walk unassisted at all. While she is spending time in her wheelchair she is able to transfer to the toilet, bad and dining chair. She also can shower now results of systems sitting on a shower bench.  Based on this I think that there were some plasmapheresis results that are beneficial the problem will be to place the catheter and undergone the treatment again.  I have offered her today  Again  subcutaneous immunoglobulin it is more global in a and a 20% liquid concentration injected subcutaneously.  Patient's  CIDP  With reduced  kidney function: Dr. Raynelle Bring (urologist) and Erling Cruz, her nephrologist has send me an EPIC note and sees no restriction of use for this form. .  She has never been on insulin. pt says his diet is "fair," and exercise is limited by health problems.  Pt reports few years of slight swelling of the legs, but no assoc sob.  She did not tolerate januvia (diarrhea).      REVIEW OF SYSTEMS: Full 14 system review of systems performed and notable only  for those listed, all others are neg:   Nicole Strong endorsed in her review of systems difficulties with walking also she is continuously able to walk with a walker or pushes her wheelchair. She had a near fall and one true file. I was in the last 6 months. She has not contracted any  bronchitis , had one  urinary tract infection in the last year. She feels fatigued she still has a lot of dysesthesia or the feeling that her legs are still as she puts it. She has no normal sensation in the lower extremities ever since her CIDP developed. She still waiting for Hizentra to be permitted by  Medicare with a peer to peer review. Due to her single kidney status IV Ig is not a good option for her treatment and therefore the subcutaneous form should be considered. I will also address her continues complained of insomnia and give her Belsomra samples at 10 mg at night.  ALLERGIES: Allergies  Allergen Reactions  . Biaxin [Clarithromycin] Other (See Comments)    "face turns red"  . Codeine Nausea And Vomiting and Other (See Comments)    Hallucinations  . Erythromycin Nausea And Vomiting    HOME MEDICATIONS: Outpatient Prescriptions Prior to Visit  Medication Sig Dispense Refill  . ALPRAZolam (XANAX) 0.25 MG tablet     . amitriptyline (ELAVIL) 10 MG tablet TAKE 4 TABS AT BEDTIME  FOR 1 WEEK THEN INCREASE TO 5 AT BEDTIME IF NECESSARY 150 tablet 3  . aspirin EC 81 MG tablet Take 81 mg by mouth daily.    . calcium citrate (CALCITRATE - DOSED IN MG ELEMENTAL CALCIUM) 950 MG tablet Take 200 mg of elemental calcium by mouth daily.    . cholecalciferol (VITAMIN D) 1000 UNITS tablet Take 5,000 Units by mouth daily.    . COD LIVER OIL PO Take 1 capsule by mouth daily.    Marland Kitchen glucose blood (FREESTYLE LITE) test strip Use to test blood sugar 2 times daily as instructed. Dx code: E11.29 100 each 11  . lisinopril (PRINIVIL,ZESTRIL) 20 MG tablet Take 20 mg by mouth daily.    . metFORMIN (GLUCOPHAGE XR) 500 MG 24 hr tablet Take 1 tablet (500 mg total) by mouth daily with breakfast. 60 tablet 2  . metoCLOPramide (REGLAN) 10 MG tablet Take 1 tablet (10 mg total) by mouth 4 (four) times daily -  before meals and at bedtime. 120 tablet 2  . nateglinide (STARLIX) 120 MG tablet Take 1 tablet (120 mg total) by mouth 3 (three) times daily with meals. 90 tablet 2  . promethazine (PHENERGAN) 25 MG tablet TAKE 1 TABLET (25 MG TOTAL) BY MOUTH AS NEEDED as directed by dr Neiman Roots  FOR NAUSEA. 90 tablet 1  . simvastatin (ZOCOR) 40 MG tablet Take 1 tablet (40 mg total) by mouth at bedtime. 30 tablet 2  . traMADol  (ULTRAM) 50 MG tablet Take 50-100 mg by mouth every 6 (six) hours as needed for moderate pain. Take 1 tablet in the morning and 2 tablets at night    . gabapentin (NEURONTIN) 300 MG capsule TAKE 1 CAPSULE (300 MG TOTAL) BY MOUTH 4 (FOUR) TIMES DAILY. 120 capsule 3  . promethazine (PHENERGAN) 25 MG tablet TAKE 1 TABLET (25 MG TOTAL) BY MOUTH AS NEEDED as directed by dr Shantee Hayne  FOR NAUSEA. 90 tablet 1   No facility-administered medications prior to visit.    PAST MEDICAL HISTORY: Past Medical History  Diagnosis Date  . Diabetes mellitus  2  . Hyperlipidemia   . Hypertension   . Renal mass     BEING WORKED UP BY UROLOGY  . Chronic inflammatory demyelinating polyneuritis   . Guillain-Barre syndrome   . Renal disorder     L kidney removed  . Cancer   . 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 Cesarean section    . Cholecystectomy    . Nephrectomy      FAMILY HISTORY: Family History  Problem Relation Age of Onset  . Cancer Father     PROSTATE  . Cancer Sister     BREAST  . Healthy Mother     SOCIAL HISTORY: History   Social History  . Marital Status: Divorced    Spouse Name: N/A    Number of Children: 1  . Years of Education: 14   Occupational History  .      disabled   Social History Main Topics  . Smoking status: Former Research scientist (life sciences)  . Smokeless tobacco: Never Used  . Alcohol Use: No  . Drug Use: No  . Sexual Activity: Not on file   Other Topics Concern  . Not on file   Social History Narrative   Patient lives at home alone.    Disabled.   Education college education.   Right handed.   Caffeine. Rare one cup.     PHYSICAL EXAM  Filed Vitals:   02/10/14 1049  BP: 149/76  Pulse: 85  Resp: 14  Height: 5' 1"  (1.549 m)  Weight: 200 lb (90.719 kg)   Body mass index is 37.81 kg/(m^2).  Generalized: Well developed, in no acute distress  Head: normocephalic and  atraumatic,. Oropharynx benign  Neck: Supple, no carotid bruits  Cardiac: Regular rate rhythm, no murmur  Musculoskeletal: hunched posture Neurological examination   Mentation: Alert oriented to time, place, history taking. Follows all commands speech and language fluent  Cranial nerve II-XII: Pupils were equal round reactive to light extraocular movements were full, visual field were full on confrontational test. Facial sensation and strength were normal. hearing was intact to finger rubbing bilaterally. Uvula tongue midline. head turning and shoulder shrug were normal and symmetric.Tongue protrusion into cheek strength was normal. Motor: normal bulk and tone, full strength in the BUE, 4/5 in both lower extremities  Sensory: Absent pinprick and vibratory to midcalf in the lower extremities, proprioception  abnormal in both feet  Coordination: finger-nose-finger,  no dysmetria Reflexes: Absent  Gait and Station: Rising up from seated position without assistance, wide based stance,  ambulated 70 feet behind rolling walker, slow steppage   DIAGNOSTIC DATA (LABS, IMAGING, TESTING) - I reviewed patient records, labs, notes, testing and imaging myself where available.      Component Value Date/Time   NA 136 12/31/2013 1229   NA 137 11/19/2012 1312   K 4.1 12/31/2013 1229   CL 98 12/31/2013 1229   CO2 27 12/31/2013 1229   GLUCOSE 314* 12/31/2013 1229   GLUCOSE 285* 11/19/2012 1312   BUN 22 12/31/2013 1229   BUN 19 11/19/2012 1312   CREATININE 1.2 12/31/2013 1229   CALCIUM 9.7 12/31/2013 1229   PROT 7.3 12/31/2013 1229   ALBUMIN 4.1 12/31/2013 1229   AST 21 12/31/2013 1229   ALT 23 12/31/2013 1229   ALKPHOS 88 12/31/2013 1229   BILITOT 0.6 12/31/2013 1229   GFRNONAA 58* 08/15/2013 0433   GFRAA  67* 08/15/2013 0433    Lab Results  Component Value Date   HGBA1C 10.0* 12/31/2013       ASSESSMENT AND PLAN  63 y.o. year old female  has Diabetes mellitus; Hyperlipidemia;  Hypertension; Renal mass; Chronic inflammatory demyelinating polyneuritis; Guillain-Barre syndrome; with gait impairment, high fall isik- wheelchair needed.   Renal disorder; Cancer; and Single kidney.  here to followup. Hizentra was denied by Medicare. She is Considered a medical candidate after consultion with her nephrologist .  Due to her kidney function she has limited options for treatment ie   IVIG not considered.  Increase nortriptyline to 4 tabs at HS for 1week  if necessary Continue Gabapentin at current dose,  refiled Hizentra  Take the TENS unit to neuro rehab for assistance , I refer for an appointment." Quell"  F/U 81month and prn.  Next appt with me,  Dr. CLarey [facility]GE[facility] [address] [place] [phone]

## 2014-02-10 NOTE — Addendum Note (Signed)
Addended by: [name] on: 02/10/2014 11:36 AM   Modules accepted: Orders

## 2014-02-11 MED ORDER — SUVOREXANT 15 MG PO TABS: 15.0000 mg | ORAL_TABLET | Freq: Every evening | ORAL | Status: DC

## 2014-02-11 NOTE — Telephone Encounter (Signed)
  Dear Mrs. Danzy I must have forgotten to give them to you, and excuse me for my confusion. Yes, Belsomra 10 or 15 mg is a safe dose. You may pick them up or send a person for you with a written permission to pick them up for you. CD

## 2014-02-15 ENCOUNTER — Encounter: Payer: Self-pay | Admitting: Neurology

## 2014-02-18 MED ORDER — SUVOREXANT 10 MG PO TABS: 1.0000 | ORAL_TABLET | Freq: Every day | ORAL | Status: DC

## 2014-02-18 NOTE — Telephone Encounter (Signed)
Pt is here for samples of Belsoma.  Gave her 2 packs of 3 tabs of Belsoma 10mg  for her to try.  Lot [id number], exp 05/2014.

## 2014-02-23 ENCOUNTER — Encounter: Payer: Self-pay | Admitting: *Deleted

## 2014-02-23 ENCOUNTER — Other Ambulatory Visit: Payer: Self-pay | Admitting: *Deleted

## 2014-02-23 MED ORDER — METFORMIN HCL ER 500 MG PO TB24: 500.0000 mg | ORAL_TABLET | Freq: Two times a day (BID) | ORAL | Status: DC

## 2014-02-26 ENCOUNTER — Other Ambulatory Visit: Payer: Self-pay | Admitting: Family Medicine

## 2014-02-26 NOTE — Telephone Encounter (Signed)
Called patient to verify if she was in need of this medication.  Patient stated that she had several pills left.  Patient has office visit scheduled for 03/01/14 and patient is aware.    eal

## 2014-03-01 ENCOUNTER — Ambulatory Visit (INDEPENDENT_AMBULATORY_CARE_PROVIDER_SITE_OTHER): Payer: Medicare Other | Admitting: Family Medicine

## 2014-03-01 ENCOUNTER — Encounter: Payer: Self-pay | Admitting: Family Medicine

## 2014-03-01 ENCOUNTER — Other Ambulatory Visit (HOSPITAL_COMMUNITY)
Admission: RE | Admit: 2014-03-01 | Discharge: 2014-03-01 | Disposition: A | Discharge status: Home / Self Care | Payer: Medicare Other | Source: Ambulatory Visit | Attending: Family Medicine | Admitting: Family Medicine

## 2014-03-01 VITALS — BP 134/72 | HR 85 | Temp 98.8°F | Ht 61.0 in | Wt 204.0 lb

## 2014-03-01 DIAGNOSIS — Z124 Encounter for screening for malignant neoplasm of cervix: Secondary | ICD-10-CM

## 2014-03-01 DIAGNOSIS — E785 Hyperlipidemia, unspecified: Secondary | ICD-10-CM

## 2014-03-01 DIAGNOSIS — Z1151 Encounter for screening for human papillomavirus (HPV): Secondary | ICD-10-CM | POA: Insufficient documentation

## 2014-03-01 DIAGNOSIS — I10 Essential (primary) hypertension: Secondary | ICD-10-CM | POA: Diagnosis not present

## 2014-03-01 DIAGNOSIS — Z0000 Encounter for general adult medical examination without abnormal findings: Secondary | ICD-10-CM | POA: Diagnosis not present

## 2014-03-01 DIAGNOSIS — E119 Type 2 diabetes mellitus without complications: Secondary | ICD-10-CM

## 2014-03-01 NOTE — Patient Instructions (Signed)
Preventive Care for Adults A healthy lifestyle and preventive care can promote health and wellness. Preventive health guidelines for women include the following key practices.  A routine yearly physical is a good way to check with your health care provider about your health and preventive screening. It is a chance to share any concerns and updates on your health and to receive a thorough exam.  Visit your dentist for a routine exam and preventive care every 6 months. Brush your teeth twice a day and floss once a day. Good oral hygiene prevents tooth decay and gum disease.  The frequency of eye exams is based on your age, health, family medical history, use of contact lenses, and other factors. Follow your health care provider's recommendations for frequency of eye exams.  Eat a healthy diet. Foods like vegetables, fruits, whole grains, low-fat dairy products, and lean protein foods contain the nutrients you need without too many calories. Decrease your intake of foods high in solid fats, added sugars, and salt. Eat the right amount of calories for you.Get information about a proper diet from your health care provider, if necessary.  Regular physical exercise is one of the most important things you can do for your health. Most adults should get at least 150 minutes of moderate-intensity exercise (any activity that increases your heart rate and causes you to sweat) each week. In addition, most adults need muscle-strengthening exercises on 2 or more days a week.  Maintain a healthy weight. The body mass index (BMI) is a screening tool to identify possible weight problems. It provides an estimate of body fat based on height and weight. Your health care provider can find your BMI and can help you achieve or maintain a healthy weight.For adults 20 years and older:  A BMI below 18.5 is considered underweight.  A BMI of 18.5 to 24.9 is normal.  A BMI of 25 to 29.9 is considered overweight.  A BMI of  30 and above is considered obese.  Maintain normal blood lipids and cholesterol levels by exercising and minimizing your intake of saturated fat. Eat a balanced diet with plenty of fruit and vegetables. Blood tests for lipids and cholesterol should begin at age 76 and be repeated every 5 years. If your lipid or cholesterol levels are high, you are over 50, or you are at high risk for heart disease, you may need your cholesterol levels checked more frequently.Ongoing high lipid and cholesterol levels should be treated with medicines if diet and exercise are not working.  If you smoke, find out from your health care provider how to quit. If you do not use tobacco, do not start.  Lung cancer screening is recommended for adults aged 22-80 years who are at high risk for developing lung cancer because of a history of smoking. A yearly low-dose CT scan of the lungs is recommended for people who have at least a 30-pack-year history of smoking and are a current smoker or have quit within the past 15 years. A pack year of smoking is smoking an average of 1 pack of cigarettes a day for 1 year (for example: 1 pack a day for 30 years or 2 packs a day for 15 years). Yearly screening should continue until the smoker has stopped smoking for at least 15 years. Yearly screening should be stopped for people who develop a health problem that would prevent them from having lung cancer treatment.  If you are pregnant, do not drink alcohol. If you are breastfeeding,  be very cautious about drinking alcohol. If you are not pregnant and choose to drink alcohol, do not have more than 1 drink per day. One drink is considered to be 12 ounces (355 mL) of beer, 5 ounces (148 mL) of wine, or 1.5 ounces (44 mL) of liquor.  Avoid use of street drugs. Do not share needles with anyone. Ask for help if you need support or instructions about stopping the use of drugs.  High blood pressure causes heart disease and increases the risk of  stroke. Your blood pressure should be checked at least every 1 to 2 years. Ongoing high blood pressure should be treated with medicines if weight loss and exercise do not work.  If you are 75-52 years old, ask your health care provider if you should take aspirin to prevent strokes.  Diabetes screening involves taking a blood sample to check your fasting blood sugar level. This should be done once every 3 years, after age 15, if you are within normal weight and without risk factors for diabetes. Testing should be considered at a younger age or be carried out more frequently if you are overweight and have at least 1 risk factor for diabetes.  Breast cancer screening is essential preventive care for women. You should practice "breast self-awareness." This means understanding the normal appearance and feel of your breasts and may include breast self-examination. Any changes detected, no matter how small, should be reported to a health care provider. Women in their 58s and 30s should have a clinical breast exam (CBE) by a health care provider as part of a regular health exam every 1 to 3 years. After age 16, women should have a CBE every year. Starting at age 53, women should consider having a mammogram (breast X-ray test) every year. Women who have a family history of breast cancer should talk to their health care provider about genetic screening. Women at a high risk of breast cancer should talk to their health care providers about having an MRI and a mammogram every year.  Breast cancer gene (BRCA)-related cancer risk assessment is recommended for women who have family members with BRCA-related cancers. BRCA-related cancers include breast, ovarian, tubal, and peritoneal cancers. Having family members with these cancers may be associated with an increased risk for harmful changes (mutations) in the breast cancer genes BRCA1 and BRCA2. Results of the assessment will determine the need for genetic counseling and  BRCA1 and BRCA2 testing.  Routine pelvic exams to screen for cancer are no longer recommended for nonpregnant women who are considered low risk for cancer of the pelvic organs (ovaries, uterus, and vagina) and who do not have symptoms. Ask your health care provider if a screening pelvic exam is right for you.  If you have had past treatment for cervical cancer or a condition that could lead to cancer, you need Pap tests and screening for cancer for at least 20 years after your treatment. If Pap tests have been discontinued, your risk factors (such as having a new sexual partner) need to be reassessed to determine if screening should be resumed. Some women have medical problems that increase the chance of getting cervical cancer. In these cases, your health care provider may recommend more frequent screening and Pap tests.  The HPV test is an additional test that may be used for cervical cancer screening. The HPV test looks for the virus that can cause the cell changes on the cervix. The cells collected during the Pap test can be  tested for HPV. The HPV test could be used to screen women aged 30 years and older, and should be used in women of any age who have unclear Pap test results. After the age of 30, women should have HPV testing at the same frequency as a Pap test.  Colorectal cancer can be detected and often prevented. Most routine colorectal cancer screening begins at the age of 50 years and continues through age 75 years. However, your health care provider may recommend screening at an earlier age if you have risk factors for colon cancer. On a yearly basis, your health care provider may provide home test kits to check for hidden blood in the stool. Use of a small camera at the end of a tube, to directly examine the colon (sigmoidoscopy or colonoscopy), can detect the earliest forms of colorectal cancer. Talk to your health care provider about this at age 50, when routine screening begins. Direct  exam of the colon should be repeated every 5-10 years through age 75 years, unless early forms of pre-cancerous polyps or small growths are found.  People who are at an increased risk for hepatitis B should be screened for this virus. You are considered at high risk for hepatitis B if:  You were born in a country where hepatitis B occurs often. Talk with your health care provider about which countries are considered high risk.  Your parents were born in a high-risk country and you have not received a shot to protect against hepatitis B (hepatitis B vaccine).  You have HIV or AIDS.  You use needles to inject street drugs.  You live with, or have sex with, someone who has hepatitis B.  You get hemodialysis treatment.  You take certain medicines for conditions like cancer, organ transplantation, and autoimmune conditions.  Hepatitis C blood testing is recommended for all people born from 1945 through 1965 and any individual with known risks for hepatitis C.  Practice safe sex. Use condoms and avoid high-risk sexual practices to reduce the spread of sexually transmitted infections (STIs). STIs include gonorrhea, chlamydia, syphilis, trichomonas, herpes, HPV, and human immunodeficiency virus (HIV). Herpes, HIV, and HPV are viral illnesses that have no cure. They can result in disability, cancer, and death.  You should be screened for sexually transmitted illnesses (STIs) including gonorrhea and chlamydia if:  You are sexually active and are younger than 24 years.  You are older than 24 years and your health care provider tells you that you are at risk for this type of infection.  Your sexual activity has changed since you were last screened and you are at an increased risk for chlamydia or gonorrhea. Ask your health care provider if you are at risk.  If you are at risk of being infected with HIV, it is recommended that you take a prescription medicine daily to prevent HIV infection. This is  called preexposure prophylaxis (PrEP). You are considered at risk if:  You are a heterosexual woman, are sexually active, and are at increased risk for HIV infection.  You take drugs by injection.  You are sexually active with a partner who has HIV.  Talk with your health care provider about whether you are at high risk of being infected with HIV. If you choose to begin PrEP, you should first be tested for HIV. You should then be tested every 3 months for as long as you are taking PrEP.  Osteoporosis is a disease in which the bones lose minerals and strength   with aging. This can result in serious bone fractures or breaks. The risk of osteoporosis can be identified using a bone density scan. Women ages 65 years and over and women at risk for fractures or osteoporosis should discuss screening with their health care providers. Ask your health care provider whether you should take a calcium supplement or vitamin D to reduce the rate of osteoporosis.  Menopause can be associated with physical symptoms and risks. Hormone replacement therapy is available to decrease symptoms and risks. You should talk to your health care provider about whether hormone replacement therapy is right for you.  Use sunscreen. Apply sunscreen liberally and repeatedly throughout the day. You should seek shade when your shadow is shorter than you. Protect yourself by wearing long sleeves, pants, a wide-brimmed hat, and sunglasses year round, whenever you are outdoors.  Once a month, do a whole body skin exam, using a mirror to look at the skin on your back. Tell your health care provider of new moles, moles that have irregular borders, moles that are larger than a pencil eraser, or moles that have changed in shape or color.  Stay current with required vaccines (immunizations).  Influenza vaccine. All adults should be immunized every year.  Tetanus, diphtheria, and acellular pertussis (Td, Tdap) vaccine. Pregnant women should  receive 1 dose of Tdap vaccine during each pregnancy. The dose should be obtained regardless of the length of time since the last dose. Immunization is preferred during the 27th-36th week of gestation. An adult who has not previously received Tdap or who does not know her vaccine status should receive 1 dose of Tdap. This initial dose should be followed by tetanus and diphtheria toxoids (Td) booster doses every 10 years. Adults with an unknown or incomplete history of completing a 3-dose immunization series with Td-containing vaccines should begin or complete a primary immunization series including a Tdap dose. Adults should receive a Td booster every 10 years.  Varicella vaccine. An adult without evidence of immunity to varicella should receive 2 doses or a second dose if she has previously received 1 dose. Pregnant females who do not have evidence of immunity should receive the first dose after pregnancy. This first dose should be obtained before leaving the health care facility. The second dose should be obtained 4-8 weeks after the first dose.  Human papillomavirus (HPV) vaccine. Females aged 13-26 years who have not received the vaccine previously should obtain the 3-dose series. The vaccine is not recommended for use in pregnant females. However, pregnancy testing is not needed before receiving a dose. If a female is found to be pregnant after receiving a dose, no treatment is needed. In that case, the remaining doses should be delayed until after the pregnancy. Immunization is recommended for any person with an immunocompromised condition through the age of 26 years if she did not get any or all doses earlier. During the 3-dose series, the second dose should be obtained 4-8 weeks after the first dose. The third dose should be obtained 24 weeks after the first dose and 16 weeks after the second dose.  Zoster vaccine. One dose is recommended for adults aged 60 years or older unless certain conditions are  present.  Measles, mumps, and rubella (MMR) vaccine. Adults born before 1957 generally are considered immune to measles and mumps. Adults born in 1957 or later should have 1 or more doses of MMR vaccine unless there is a contraindication to the vaccine or there is laboratory evidence of immunity to   each of the three diseases. A routine second dose of MMR vaccine should be obtained at least 28 days after the first dose for students attending postsecondary schools, health care workers, or international travelers. People who received inactivated measles vaccine or an unknown type of measles vaccine during 1963-1967 should receive 2 doses of MMR vaccine. People who received inactivated mumps vaccine or an unknown type of mumps vaccine before 1979 and are at high risk for mumps infection should consider immunization with 2 doses of MMR vaccine. For females of childbearing age, rubella immunity should be determined. If there is no evidence of immunity, females who are not pregnant should be vaccinated. If there is no evidence of immunity, females who are pregnant should delay immunization until after pregnancy. Unvaccinated health care workers born before 1957 who lack laboratory evidence of measles, mumps, or rubella immunity or laboratory confirmation of disease should consider measles and mumps immunization with 2 doses of MMR vaccine or rubella immunization with 1 dose of MMR vaccine.  Pneumococcal 13-valent conjugate (PCV13) vaccine. When indicated, a person who is uncertain of her immunization history and has no record of immunization should receive the PCV13 vaccine. An adult aged 19 years or older who has certain medical conditions and has not been previously immunized should receive 1 dose of PCV13 vaccine. This PCV13 should be followed with a dose of pneumococcal polysaccharide (PPSV23) vaccine. The PPSV23 vaccine dose should be obtained at least 8 weeks after the dose of PCV13 vaccine. An adult aged 19  years or older who has certain medical conditions and previously received 1 or more doses of PPSV23 vaccine should receive 1 dose of PCV13. The PCV13 vaccine dose should be obtained 1 or more years after the last PPSV23 vaccine dose.  Pneumococcal polysaccharide (PPSV23) vaccine. When PCV13 is also indicated, PCV13 should be obtained first. All adults aged 65 years and older should be immunized. An adult younger than age 65 years who has certain medical conditions should be immunized. Any person who resides in a nursing home or long-term care facility should be immunized. An adult smoker should be immunized. People with an immunocompromised condition and certain other conditions should receive both PCV13 and PPSV23 vaccines. People with human immunodeficiency virus (HIV) infection should be immunized as soon as possible after diagnosis. Immunization during chemotherapy or radiation therapy should be avoided. Routine use of PPSV23 vaccine is not recommended for American Indians, [place] Natives, or people younger than 65 years unless there are medical conditions that require PPSV23 vaccine. When indicated, people who have unknown immunization and have no record of immunization should receive PPSV23 vaccine. One-time revaccination 5 years after the first dose of PPSV23 is recommended for people aged 19-64 years who have chronic kidney failure, nephrotic syndrome, asplenia, or immunocompromised conditions. People who received 1-2 doses of PPSV23 before age 65 years should receive another dose of PPSV23 vaccine at age 65 years or later if at least 5 years have passed since the previous dose. Doses of PPSV23 are not needed for people immunized with PPSV23 at or after age 65 years.  Meningococcal vaccine. Adults with asplenia or persistent complement component deficiencies should receive 2 doses of quadrivalent meningococcal conjugate (MenACWY-D) vaccine. The doses should be obtained at least 2 months apart.  Microbiologists working with certain meningococcal bacteria, military recruits, people at risk during an outbreak, and people who travel to or live in countries with a high rate of meningitis should be immunized. A first-year college student up through age   21 years who is living in a residence hall should receive a dose if she did not receive a dose on or after her 16th birthday. Adults who have certain high-risk conditions should receive one or more doses of vaccine.  Hepatitis A vaccine. Adults who wish to be protected from this disease, have certain high-risk conditions, work with hepatitis A-infected animals, work in hepatitis A research labs, or travel to or work in countries with a high rate of hepatitis A should be immunized. Adults who were previously unvaccinated and who anticipate close contact with an international adoptee during the first 60 days after arrival in the [place] from a country with a high rate of hepatitis A should be immunized.  Hepatitis B vaccine. Adults who wish to be protected from this disease, have certain high-risk conditions, may be exposed to blood or other infectious body fluids, are household contacts or sex partners of hepatitis B positive people, are clients or workers in certain care facilities, or travel to or work in countries with a high rate of hepatitis B should be immunized.  Haemophilus influenzae type b (Hib) vaccine. A previously unvaccinated person with asplenia or sickle cell disease or having a scheduled splenectomy should receive 1 dose of Hib vaccine. Regardless of previous immunization, a recipient of a hematopoietic stem cell transplant should receive a 3-dose series 6-12 months after her successful transplant. Hib vaccine is not recommended for adults with HIV infection. Preventive Services / Frequency Ages 64 to 68 years  Blood pressure check.** / Every 1 to 2 years.  Lipid and cholesterol check.** / Every 5 years beginning at age  22.  Clinical breast exam.** / Every 3 years for women in their 88s and 53s.  BRCA-related cancer risk assessment.** / For women who have family members with a BRCA-related cancer (breast, ovarian, tubal, or peritoneal cancers).  Pap test.** / Every 2 years from ages 90 through 51. Every 3 years starting at age 21 through age 56 or 3 with a history of 3 consecutive normal Pap tests.  HPV screening.** / Every 3 years from ages 24 through ages 1 to 46 with a history of 3 consecutive normal Pap tests.  Hepatitis C blood test.** / For any individual with known risks for hepatitis C.  Skin self-exam. / Monthly.  Influenza vaccine. / Every year.  Tetanus, diphtheria, and acellular pertussis (Tdap, Td) vaccine.** / Consult your health care provider. Pregnant women should receive 1 dose of Tdap vaccine during each pregnancy. 1 dose of Td every 10 years.  Varicella vaccine.** / Consult your health care provider. Pregnant females who do not have evidence of immunity should receive the first dose after pregnancy.  HPV vaccine. / 3 doses over 6 months, if 72 and younger. The vaccine is not recommended for use in pregnant females. However, pregnancy testing is not needed before receiving a dose.  Measles, mumps, rubella (MMR) vaccine.** / You need at least 1 dose of MMR if you were born in 1957 or later. You may also need a 2nd dose. For females of childbearing age, rubella immunity should be determined. If there is no evidence of immunity, females who are not pregnant should be vaccinated. If there is no evidence of immunity, females who are pregnant should delay immunization until after pregnancy.  Pneumococcal 13-valent conjugate (PCV13) vaccine.** / Consult your health care provider.  Pneumococcal polysaccharide (PPSV23) vaccine.** / 1 to 2 doses if you smoke cigarettes or if you have certain conditions.  Meningococcal vaccine.** /  1 dose if you are age 19 to 21 years and a first-year college  student living in a residence hall, or have one of several medical conditions, you need to get vaccinated against meningococcal disease. You may also need additional booster doses.  Hepatitis A vaccine.** / Consult your health care provider.  Hepatitis B vaccine.** / Consult your health care provider.  Haemophilus influenzae type b (Hib) vaccine.** / Consult your health care provider. Ages 40 to 64 years  Blood pressure check.** / Every 1 to 2 years.  Lipid and cholesterol check.** / Every 5 years beginning at age 20 years.  Lung cancer screening. / Every year if you are aged 55-80 years and have a 30-pack-year history of smoking and currently smoke or have quit within the past 15 years. Yearly screening is stopped once you have quit smoking for at least 15 years or develop a health problem that would prevent you from having lung cancer treatment.  Clinical breast exam.** / Every year after age 40 years.  BRCA-related cancer risk assessment.** / For women who have family members with a BRCA-related cancer (breast, ovarian, tubal, or peritoneal cancers).  Mammogram.** / Every year beginning at age 40 years and continuing for as long as you are in good health. Consult with your health care provider.  Pap test.** / Every 3 years starting at age 30 years through age 65 or 70 years with a history of 3 consecutive normal Pap tests.  HPV screening.** / Every 3 years from ages 30 years through ages 65 to 70 years with a history of 3 consecutive normal Pap tests.  Fecal occult blood test (FOBT) of stool. / Every year beginning at age 50 years and continuing until age 75 years. You may not need to do this test if you get a colonoscopy every 10 years.  Flexible sigmoidoscopy or colonoscopy.** / Every 5 years for a flexible sigmoidoscopy or every 10 years for a colonoscopy beginning at age 50 years and continuing until age 75 years.  Hepatitis C blood test.** / For all people born from 1945 through  1965 and any individual with known risks for hepatitis C.  Skin self-exam. / Monthly.  Influenza vaccine. / Every year.  Tetanus, diphtheria, and acellular pertussis (Tdap/Td) vaccine.** / Consult your health care provider. Pregnant women should receive 1 dose of Tdap vaccine during each pregnancy. 1 dose of Td every 10 years.  Varicella vaccine.** / Consult your health care provider. Pregnant females who do not have evidence of immunity should receive the first dose after pregnancy.  Zoster vaccine.** / 1 dose for adults aged 60 years or older.  Measles, mumps, rubella (MMR) vaccine.** / You need at least 1 dose of MMR if you were born in 1957 or later. You may also need a 2nd dose. For females of childbearing age, rubella immunity should be determined. If there is no evidence of immunity, females who are not pregnant should be vaccinated. If there is no evidence of immunity, females who are pregnant should delay immunization until after pregnancy.  Pneumococcal 13-valent conjugate (PCV13) vaccine.** / Consult your health care provider.  Pneumococcal polysaccharide (PPSV23) vaccine.** / 1 to 2 doses if you smoke cigarettes or if you have certain conditions.  Meningococcal vaccine.** / Consult your health care provider.  Hepatitis A vaccine.** / Consult your health care provider.  Hepatitis B vaccine.** / Consult your health care provider.  Haemophilus influenzae type b (Hib) vaccine.** / Consult your health care provider. Ages 65   years and over  Blood pressure check.** / Every 1 to 2 years.  Lipid and cholesterol check.** / Every 5 years beginning at age 22 years.  Lung cancer screening. / Every year if you are aged 73-80 years and have a 30-pack-year history of smoking and currently smoke or have quit within the past 15 years. Yearly screening is stopped once you have quit smoking for at least 15 years or develop a health problem that would prevent you from having lung cancer  treatment.  Clinical breast exam.** / Every year after age 4 years.  BRCA-related cancer risk assessment.** / For women who have family members with a BRCA-related cancer (breast, ovarian, tubal, or peritoneal cancers).  Mammogram.** / Every year beginning at age 40 years and continuing for as long as you are in good health. Consult with your health care provider.  Pap test.** / Every 3 years starting at age 9 years through age 34 or 91 years with 3 consecutive normal Pap tests. Testing can be stopped between 65 and 70 years with 3 consecutive normal Pap tests and no abnormal Pap or HPV tests in the past 10 years.  HPV screening.** / Every 3 years from ages 57 years through ages 64 or 45 years with a history of 3 consecutive normal Pap tests. Testing can be stopped between 65 and 70 years with 3 consecutive normal Pap tests and no abnormal Pap or HPV tests in the past 10 years.  Fecal occult blood test (FOBT) of stool. / Every year beginning at age 15 years and continuing until age 17 years. You may not need to do this test if you get a colonoscopy every 10 years.  Flexible sigmoidoscopy or colonoscopy.** / Every 5 years for a flexible sigmoidoscopy or every 10 years for a colonoscopy beginning at age 86 years and continuing until age 71 years.  Hepatitis C blood test.** / For all people born from [birth year] through 1967 and any individual with known risks for hepatitis C.  Osteoporosis screening.** / A one-time screening for women ages 83 years and over and women at risk for fractures or osteoporosis.  Skin self-exam. / Monthly.  Influenza vaccine. / Every year.  Tetanus, diphtheria, and acellular pertussis (Tdap/Td) vaccine.** / 1 dose of Td every 10 years.  Varicella vaccine.** / Consult your health care provider.  Zoster vaccine.** / 1 dose for adults aged 61 years or older.  Pneumococcal 13-valent conjugate (PCV13) vaccine.** / Consult your health care provider.  Pneumococcal  polysaccharide (PPSV23) vaccine.** / 1 dose for all adults aged 28 years and older.  Meningococcal vaccine.** / Consult your health care provider.  Hepatitis A vaccine.** / Consult your health care provider.  Hepatitis B vaccine.** / Consult your health care provider.  Haemophilus influenzae type b (Hib) vaccine.** / Consult your health care provider. ** Family history and personal history of risk and conditions may change your health care provider's recommendations. Document Released: 03/27/2001 Document Revised: 06/15/2013 Document Reviewed: 06/26/2010 [facility] Patient Information 2015 [place], [place]. This information is not intended to replace advice given to you by your health care provider. Make sure you discuss any questions you have with your health care provider.

## 2014-03-01 NOTE — Progress Notes (Signed)
Pre visit review using our clinic review tool, if applicable. No additional management support is needed unless otherwise documented below in the visit note. 

## 2014-03-01 NOTE — Progress Notes (Signed)
Subjective:    Nicole Strong is a 64 y.o. female who presents for Medicare Annual/Subsequent preventive examination.  Preventive Screening-Counseling & Management  Tobacco History  Smoking status  . Former Smoker  Smokeless tobacco  . Never Used     Problems Prior to Visit 1. Nothing new  Current Problems (verified) Patient Active Problem List   Diagnosis Date Noted  . CIDP (chronic inflammatory demyelinating polyneuropathy) 02/10/2014  . Type 2 diabetes mellitus, uncontrolled, with renal complications [id number]  . Enteritis due to Clostridium difficile 08/15/2013  . ARF (acute renal failure) 08/14/2013  . Acute renal failure 08/14/2013  . Obesity (BMI 30-39.9) 11/19/2012  . Single kidney   . Diarrhea 05/31/2012  . Dehydration 05/31/2012  . Weakness generalized 05/31/2012  . Hyponatremia 05/31/2012  . Gait disorder 05/19/2012  . 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 NEUROPATHY 01/26/2010  . BACK PAIN, LUMBAR, WITH RADICULOPATHY 01/26/2010  . NAUSEA WITH VOMITING 01/26/2010  . HYPERLIPIDEMIA 10/19/2009  . HYPERTENSION 10/19/2009  . CARDIAC MURMUR 10/19/2009  . POSTMENOPAUSAL STATUS 10/19/2009    Medications Prior to Visit Current Outpatient Prescriptions on File Prior to Visit  Medication Sig Dispense Refill  . ALPRAZolam (XANAX) 0.25 MG tablet     . amitriptyline (ELAVIL) 10 MG tablet TAKE 4 TABS AT BEDTIME  FOR 1 WEEK THEN INCREASE TO 5 AT BEDTIME IF NECESSARY 150 tablet 3  . aspirin EC 81 MG tablet Take 81 mg by mouth daily.    . calcium citrate (CALCITRATE - DOSED IN MG ELEMENTAL CALCIUM) 950 MG tablet Take 200 mg of elemental calcium by mouth daily.    . cholecalciferol (VITAMIN D) 1000 UNITS tablet Take 5,000 Units by mouth daily.    . COD LIVER OIL PO Take 1 capsule by mouth daily.    Marland Kitchen gabapentin (NEURONTIN) 300 MG capsule TAKE 1 CAPSULE (300 MG TOTAL) BY MOUTH 4 (FOUR) TIMES DAILY. 120 capsule 3  . glucose  blood (FREESTYLE LITE) test strip Use to test blood sugar 2 times daily as instructed. Dx code: E11.29 100 each 11  . Immune Globulin, Human, 1 GM/5ML SOLN Subcutaneus use per weight , patient needs chem 7 every week. [facility] . 10 mL 5  . lisinopril (PRINIVIL,ZESTRIL) 20 MG tablet Take 20 mg by mouth daily.    . metFORMIN (GLUCOPHAGE XR) 500 MG 24 hr tablet Take 1 tablet (500 mg total) by mouth 2 (two) times daily. 60 tablet 1  . metoCLOPramide (REGLAN) 10 MG tablet Take 1 tablet (10 mg total) by mouth 4 (four) times daily -  before meals and at bedtime. 120 tablet 2  . nateglinide (STARLIX) 120 MG tablet Take 1 tablet (120 mg total) by mouth 3 (three) times daily with meals. 90 tablet 2  . promethazine (PHENERGAN) 25 MG tablet TAKE 1 TABLET (25 MG TOTAL) BY MOUTH AS NEEDED as directed by dr dohmeier  FOR NAUSEA. 90 tablet 1  . simvastatin (ZOCOR) 40 MG tablet Take 1 tablet (40 mg total) by mouth at bedtime. 30 tablet 2  . Suvorexant (BELSOMRA) 10 MG TABS Take 1 tablet by mouth at bedtime. 6 tablet 0  . Suvorexant (BELSOMRA) 15 MG TABS Take 15 mg by mouth Nightly. 10 tablet 0  . traMADol (ULTRAM) 50 MG tablet Take 1-2 tablets (50-100 mg total) by mouth every 6 (six) hours as needed for moderate pain or severe pain. Take 1 tablet in the morning and 2 tablets at night 90 tablet 1  No current facility-administered medications on file prior to visit.    Current Medications (verified) Current Outpatient Prescriptions  Medication Sig Dispense Refill  . ALPRAZolam (XANAX) 0.25 MG tablet     . amitriptyline (ELAVIL) 10 MG tablet TAKE 4 TABS AT BEDTIME  FOR 1 WEEK THEN INCREASE TO 5 AT BEDTIME IF NECESSARY 150 tablet 3  . aspirin EC 81 MG tablet Take 81 mg by mouth daily.    . calcium citrate (CALCITRATE - DOSED IN MG ELEMENTAL CALCIUM) 950 MG tablet Take 200 mg of elemental calcium by mouth daily.    . cholecalciferol (VITAMIN D) 1000 UNITS tablet Take 5,000 Units by mouth daily.    . COD  LIVER OIL PO Take 1 capsule by mouth daily.    Marland Kitchen gabapentin (NEURONTIN) 300 MG capsule TAKE 1 CAPSULE (300 MG TOTAL) BY MOUTH 4 (FOUR) TIMES DAILY. 120 capsule 3  . glucose blood (FREESTYLE LITE) test strip Use to test blood sugar 2 times daily as instructed. Dx code: E11.29 100 each 11  . Immune Globulin, Human, 1 GM/5ML SOLN Subcutaneus use per weight , patient needs chem 7 every week. [facility] . 10 mL 5  . lisinopril (PRINIVIL,ZESTRIL) 20 MG tablet Take 20 mg by mouth daily.    . metFORMIN (GLUCOPHAGE XR) 500 MG 24 hr tablet Take 1 tablet (500 mg total) by mouth 2 (two) times daily. 60 tablet 1  . metoCLOPramide (REGLAN) 10 MG tablet Take 1 tablet (10 mg total) by mouth 4 (four) times daily -  before meals and at bedtime. 120 tablet 2  . nateglinide (STARLIX) 120 MG tablet Take 1 tablet (120 mg total) by mouth 3 (three) times daily with meals. 90 tablet 2  . promethazine (PHENERGAN) 25 MG tablet TAKE 1 TABLET (25 MG TOTAL) BY MOUTH AS NEEDED as directed by dr dohmeier  FOR NAUSEA. 90 tablet 1  . simvastatin (ZOCOR) 40 MG tablet Take 1 tablet (40 mg total) by mouth at bedtime. 30 tablet 2  . Suvorexant (BELSOMRA) 10 MG TABS Take 1 tablet by mouth at bedtime. 6 tablet 0  . Suvorexant (BELSOMRA) 15 MG TABS Take 15 mg by mouth Nightly. 10 tablet 0  . traMADol (ULTRAM) 50 MG tablet Take 1-2 tablets (50-100 mg total) by mouth every 6 (six) hours as needed for moderate pain or severe pain. Take 1 tablet in the morning and 2 tablets at night 90 tablet 1   No current facility-administered medications for this visit.     Allergies (verified) Biaxin; Codeine; and Erythromycin   PAST HISTORY  Family History Family History  Problem Relation Age of Onset  . Cancer Father     PROSTATE  . Cancer Sister     BREAST  . Healthy Mother     Social History History  Substance Use Topics  . Smoking status: Former Research scientist (life sciences)  . Smokeless tobacco: Never Used  . Alcohol Use: No     Are there  smokers in your home (other than you)? No  Risk Factors Current exercise habits: The patient does not participate in regular exercise at present.  Dietary issues discussed: na   Cardiac risk factors: advanced age (older than 81 for men, 10 for women), diabetes mellitus, dyslipidemia, hypertension, obesity (BMI >= 30 kg/m2) and sedentary lifestyle.  Depression Screen (Note: if answer to either of the following is "Yes", a more complete depression screening is indicated)   Over the past two weeks, have you felt down, depressed or hopeless? No  Over the past two weeks, have you felt little interest or pleasure in doing things? No  Have you lost interest or pleasure in daily life? No  Do you often feel hopeless? No  Do you cry easily over simple problems? No  Activities of Daily Living In your present state of health, do you have any difficulty performing the following activities?:  Driving? Yes Managing money?  No Feeding yourself? No Getting from bed to chair? No Climbing a flight of stairs? Yes Preparing food and eating?: No Bathing or showering? No Getting dressed: No Getting to the toilet? No Using the toilet:No Moving around from place to place: No In the past year have you fallen or had a near fall?:No   Are you sexually active?  Yes  Do you have more than one partner?  No  Hearing Difficulties: No Do you often ask people to speak up or repeat themselves? No Do you experience ringing or noises in your ears? No Do you have difficulty understanding soft or whispered voices? No   Do you feel that you have a problem with memory? No  Do you often misplace items? No  Do you feel safe at home?  Yes  Cognitive Testing  Alert? Yes  Normal Appearance?Yes  Oriented to person? Yes  Place? Yes   Time? Yes  Recall of three objects?  Yes  Can perform simple calculations? Yes  Displays appropriate judgment?Yes  Can read the correct time from a watch face?Yes   Advanced  Directives have been discussed with the patient? Yes  List the Names of Other Physician/Practitioners you currently use: 1.    Indicate any recent Medical Services you may have received from other than [facility] providers in the past year (date may be approximate).  Immunization History  Administered Date(s) Administered  . Td 10/19/2009    Screening Tests Health Maintenance  Topic Date Due  . MAMMOGRAM  07/07/1998  . COLONOSCOPY  07/07/1998  . DEXA SCAN  07/06/2013  . FOOT EXAM  07/24/2013  . OPHTHALMOLOGY EXAM  04/01/2014  . HEMOGLOBIN A1C  07/01/2014  . URINE MICROALBUMIN  01/01/2015  . TETANUS/TDAP  10/20/2019    All answers were reviewed with the patient and necessary referrals were made:  Garnet Koyanagi, DO   03/01/2014   History reviewed:  She  has a past medical history of Diabetes mellitus; Hyperlipidemia; Hypertension; Renal mass; Chronic inflammatory demyelinating polyneuritis; Guillain-Barre syndrome; Renal disorder; Cancer; and Single kidney. She  does not have any pertinent problems on file. She  has past surgical history that includes Cesarean section; Cholecystectomy; and Nephrectomy. Her family history includes Cancer in her father and sister; Healthy in her mother. She  reports that she has quit smoking. She has never used smokeless tobacco. She reports that she does not drink alcohol or use illicit drugs. She has a current medication list which includes the following prescription(s): alprazolam, amitriptyline, aspirin ec, calcium citrate, cholecalciferol, cod liver oil, gabapentin, glucose blood, immune globulin (human), lisinopril, metformin, metoclopramide, nateglinide, promethazine, simvastatin, suvorexant, suvorexant, and tramadol. Current Outpatient Prescriptions on File Prior to Visit  Medication Sig Dispense Refill  . ALPRAZolam (XANAX) 0.25 MG tablet     . amitriptyline (ELAVIL) 10 MG tablet TAKE 4 TABS AT BEDTIME  FOR 1 WEEK THEN INCREASE TO 5 AT BEDTIME IF  NECESSARY 150 tablet 3  . aspirin EC 81 MG tablet Take 81 mg by mouth daily.    . calcium citrate (CALCITRATE - DOSED IN MG ELEMENTAL CALCIUM)  950 MG tablet Take 200 mg of elemental calcium by mouth daily.    . cholecalciferol (VITAMIN D) 1000 UNITS tablet Take 5,000 Units by mouth daily.    . COD LIVER OIL PO Take 1 capsule by mouth daily.    Marland Kitchen gabapentin (NEURONTIN) 300 MG capsule TAKE 1 CAPSULE (300 MG TOTAL) BY MOUTH 4 (FOUR) TIMES DAILY. 120 capsule 3  . glucose blood (FREESTYLE LITE) test strip Use to test blood sugar 2 times daily as instructed. Dx code: E11.29 100 each 11  . Immune Globulin, Human, 1 GM/5ML SOLN Subcutaneus use per weight , patient needs chem 7 every week. [facility] . 10 mL 5  . lisinopril (PRINIVIL,ZESTRIL) 20 MG tablet Take 20 mg by mouth daily.    . metFORMIN (GLUCOPHAGE XR) 500 MG 24 hr tablet Take 1 tablet (500 mg total) by mouth 2 (two) times daily. 60 tablet 1  . metoCLOPramide (REGLAN) 10 MG tablet Take 1 tablet (10 mg total) by mouth 4 (four) times daily -  before meals and at bedtime. 120 tablet 2  . nateglinide (STARLIX) 120 MG tablet Take 1 tablet (120 mg total) by mouth 3 (three) times daily with meals. 90 tablet 2  . promethazine (PHENERGAN) 25 MG tablet TAKE 1 TABLET (25 MG TOTAL) BY MOUTH AS NEEDED as directed by dr dohmeier  FOR NAUSEA. 90 tablet 1  . simvastatin (ZOCOR) 40 MG tablet Take 1 tablet (40 mg total) by mouth at bedtime. 30 tablet 2  . Suvorexant (BELSOMRA) 10 MG TABS Take 1 tablet by mouth at bedtime. 6 tablet 0  . Suvorexant (BELSOMRA) 15 MG TABS Take 15 mg by mouth Nightly. 10 tablet 0  . traMADol (ULTRAM) 50 MG tablet Take 1-2 tablets (50-100 mg total) by mouth every 6 (six) hours as needed for moderate pain or severe pain. Take 1 tablet in the morning and 2 tablets at night 90 tablet 1   No current facility-administered medications on file prior to visit.   She is allergic to biaxin; codeine; and erythromycin.  Review of  Systems  Review of Systems  Constitutional: Negative for activity change, appetite change and fatigue.  HENT: Negative for hearing loss, congestion, tinnitus and ear discharge.   Eyes: Negative for visual disturbance (see optho q1y -- vision corrected to 20/20 with glasses).  Respiratory: Negative for cough, chest tightness and shortness of breath.   Cardiovascular: Negative for chest pain, palpitations and leg swelling.  Gastrointestinal: Negative for abdominal pain, diarrhea, constipation and abdominal distention.  Genitourinary: Negative for urgency, frequency, decreased urine volume and difficulty urinating.  Musculoskeletal: Negative for back pain, arthralgias and gait problem.  Skin: Negative for color change, pallor and rash.  Neurological: Negative for dizziness, light-headedness, numbness and headaches.  Hematological: Negative for adenopathy. Does not bruise/bleed easily.  Psychiatric/Behavioral: Negative for suicidal ideas, confusion, sleep disturbance, self-injury, dysphoric mood, decreased concentration and agitation.  Uses walker / wheelchair Pt is able to read and write and can do all ADL No abuse/ violence in home      Objective:     Vision by Snellen chart: opth  Body mass index is 38.57 kg/(m^2). BP 134/72 mmHg  Pulse 85  Temp(Src) 98.8 F (37.1 C) (Oral)  Ht 5\' 1"  (1.549 m)  Wt 204 lb (92.534 kg)  BMI 38.57 kg/m2  SpO2 95%  BP 134/72 mmHg  Pulse 85  Temp(Src) 98.8 F (37.1 C) (Oral)  Ht 5\' 1"  (1.549 m)  Wt 204 lb (92.534 kg)  BMI 38.57 kg/m2  SpO2 95% General appearance: alert, cooperative, appears stated age and no distress Head: Normocephalic, without obvious abnormality, atraumatic Eyes: conjunctivae/corneas clear. PERRL, EOM's intact. Fundi benign. Ears: normal TM's and external ear canals both ears Nose: Nares normal. Septum midline. Mucosa normal. No drainage or sinus tenderness. Throat: lips, mucosa, and tongue normal; teeth and gums  normal Neck: no adenopathy, no carotid bruit, no JVD, supple, symmetrical, trachea midline and thyroid not enlarged, symmetric, no tenderness/mass/nodules Back: symmetric, no curvature. ROM normal. No CVA tenderness. Lungs: clear to auscultation bilaterally Breasts: normal appearance, no masses or tenderness Heart: regular rate and rhythm, S1, S2 normal, no murmur, click, rub or gallop Abdomen: soft, non-tender; bowel sounds normal; no masses,  no organomegaly Pelvic: cervix normal in appearance, external genitalia normal, no adnexal masses or tenderness, no cervical motion tenderness, rectovaginal septum normal, uterus normal size, shape, and consistency, vagina normal without discharge and pap done, rectal heme - brown stool Extremities: cervix normal in appearance, external genitalia normal, no adnexal masses or tenderness, no cervical motion tenderness, rectovaginal septum normal, uterus normal size, shape, and consistency, vagina normal without discharge and pap done, heme neg brown stool Pulses: 2+ and symmetric Skin: Skin color, texture, turgor normal. No rashes or lesions Lymph nodes: Cervical, supraclavicular, and axillary nodes normal. Neurologic: Alert and oriented X 3, normal strength and tone. Normal symmetric reflexes. Normal coordination and gait Psych-- no depression, no anxiety      Assessment:     cpe      Plan:     During the course of the visit the patient was educated and counseled about appropriate screening and preventive services including:    Screening mammography  Screening Pap smear and pelvic exam   Bone densitometry screening  Colorectal cancer screening  Diabetes screening  Glaucoma screening  Advanced directives: has an advanced directive - a copy HAS NOT been provided.  Diet review for nutrition referral? Yes ____  Not Indicated ____   Patient Instructions (the written plan) was given to the patient.  Medicare Attestation I have  personally reviewed: The patient's medical and social history Their use of alcohol, tobacco or illicit drugs Their current medications and supplements The patient's functional ability including ADLs,fall risks, home safety risks, cognitive, and hearing and visual impairment Diet and physical activities Evidence for depression or mood disorders  The patient's weight, height, BMI, and visual acuity have been recorded in the chart.  I have made referrals, counseling, and provided education to the patient based on review of the above and I have provided the patient with a written personalized care plan for preventive services.   1. Diabetes mellitus type II, controlled Check labs, con't starlix, metformin - POCT urinalysis dipstick - Microalbumin / creatinine urine ratio  2. Essential hypertension Stable, cont' lisinopril - Hepatic function panel - Lipid panel - POCT urinalysis dipstick - Microalbumin / creatinine urine ratio  3. Hyperlipidemia Check labs  - Basic metabolic panel - CBC with Differential - POCT urinalysis dipstick - Microalbumin / creatinine urine ratio  4. Screening for cervical cancer  - Cytology - PAP 5. Hx GBS-- no immunizations  Garnet Koyanagi, DO   03/01/2014

## 2014-03-02 LAB — CYTOLOGY - PAP

## 2014-03-03 ENCOUNTER — Encounter: Payer: Self-pay | Admitting: Family Medicine

## 2014-03-04 ENCOUNTER — Ambulatory Visit: Payer: Medicare Other | Admitting: Internal Medicine

## 2014-03-10 ENCOUNTER — Telehealth: Payer: Self-pay | Admitting: Neurology

## 2014-03-10 NOTE — Telephone Encounter (Signed)
Parks Ranger with [facility] @ [phone].  Questioning specific dosages for Rx  HIZENTRA, 1 gram per kilogram every 4 weeks.  Questioning if Dr. Brett Fairy wants maintenance dose, and if so script needs to be written as the following: HIZENTRA 17 gram twice weekly, dispense 136 grams for 12 refill.  If Dr. Roxy Horseman would want initial loading dose, script should read as the following: 2 grams per kilogam of HIZENTRA 179 grams 4 times weekly, dispensed 272 gram with 0 refills.  Please call and advise.

## 2014-03-11 ENCOUNTER — Encounter: Payer: Self-pay | Admitting: Neurology

## 2014-03-15 ENCOUNTER — Encounter (HOSPITAL_COMMUNITY): Payer: Self-pay | Admitting: Emergency Medicine

## 2014-03-15 ENCOUNTER — Emergency Department (HOSPITAL_COMMUNITY)
Admission: EM | Admit: 2014-03-15 | Discharge: 2014-03-16 | Disposition: A | Discharge status: Home / Self Care | Payer: Medicare Other | Attending: Emergency Medicine | Admitting: Emergency Medicine

## 2014-03-15 ENCOUNTER — Emergency Department (HOSPITAL_COMMUNITY): Payer: Medicare Other

## 2014-03-15 ENCOUNTER — Telehealth: Payer: Self-pay | Admitting: Neurology

## 2014-03-15 DIAGNOSIS — Z87891 Personal history of nicotine dependence: Secondary | ICD-10-CM | POA: Insufficient documentation

## 2014-03-15 DIAGNOSIS — I10 Essential (primary) hypertension: Secondary | ICD-10-CM | POA: Diagnosis not present

## 2014-03-15 DIAGNOSIS — K859 Acute pancreatitis, unspecified: Secondary | ICD-10-CM | POA: Diagnosis not present

## 2014-03-15 DIAGNOSIS — Z905 Acquired absence of kidney: Secondary | ICD-10-CM | POA: Insufficient documentation

## 2014-03-15 DIAGNOSIS — G6181 Chronic inflammatory demyelinating polyneuritis: Secondary | ICD-10-CM

## 2014-03-15 DIAGNOSIS — Z79899 Other long term (current) drug therapy: Secondary | ICD-10-CM | POA: Diagnosis not present

## 2014-03-15 DIAGNOSIS — Z7982 Long term (current) use of aspirin: Secondary | ICD-10-CM | POA: Insufficient documentation

## 2014-03-15 DIAGNOSIS — R1010 Upper abdominal pain, unspecified: Secondary | ICD-10-CM | POA: Diagnosis not present

## 2014-03-15 DIAGNOSIS — E785 Hyperlipidemia, unspecified: Secondary | ICD-10-CM | POA: Diagnosis not present

## 2014-03-15 DIAGNOSIS — R109 Unspecified abdominal pain: Secondary | ICD-10-CM | POA: Diagnosis present

## 2014-03-15 DIAGNOSIS — Z8669 Personal history of other diseases of the nervous system and sense organs: Secondary | ICD-10-CM | POA: Insufficient documentation

## 2014-03-15 DIAGNOSIS — E1165 Type 2 diabetes mellitus with hyperglycemia: Secondary | ICD-10-CM | POA: Diagnosis not present

## 2014-03-15 DIAGNOSIS — K5909 Other constipation: Secondary | ICD-10-CM | POA: Insufficient documentation

## 2014-03-15 DIAGNOSIS — Z859 Personal history of malignant neoplasm, unspecified: Secondary | ICD-10-CM | POA: Diagnosis not present

## 2014-03-15 DIAGNOSIS — R739 Hyperglycemia, unspecified: Secondary | ICD-10-CM

## 2014-03-15 DIAGNOSIS — Z8742 Personal history of other diseases of the female genital tract: Secondary | ICD-10-CM | POA: Insufficient documentation

## 2014-03-15 LAB — COMPREHENSIVE METABOLIC PANEL
ALT: 15 U/L (ref 0–35)
ANION GAP: 13 (ref 5–15)
AST: 16 U/L (ref 0–37)
Albumin: 4.1 g/dL (ref 3.5–5.2)
Alkaline Phosphatase: 96 U/L (ref 39–117)
BUN: 27 mg/dL — AB (ref 6–23)
CO2: 24 mmol/L (ref 19–32)
Calcium: 9.6 mg/dL (ref 8.4–10.5)
Chloride: 94 mmol/L — ABNORMAL LOW (ref 96–112)
Creatinine, Ser: 1.18 mg/dL — ABNORMAL HIGH (ref 0.50–1.10)
GFR calc Af Amer: 55 mL/min — ABNORMAL LOW (ref >90)
GFR, EST NON AFRICAN AMERICAN: 47 mL/min — AB (ref >90)
Glucose, Bld: 595 mg/dL (ref 70–99)
Potassium: 4.7 mmol/L (ref 3.5–5.1)
SODIUM: 131 mmol/L — AB (ref 135–145)
Total Bilirubin: 0.8 mg/dL (ref 0.3–1.2)
Total Protein: 7.2 g/dL (ref 6.0–8.3)

## 2014-03-15 LAB — CBC WITH DIFFERENTIAL/PLATELET
BASOS PCT: 0 % (ref 0–1)
Basophils Absolute: 0 10*3/uL (ref 0.0–0.1)
EOS ABS: 0.1 10*3/uL (ref 0.0–0.7)
Eosinophils Relative: 1 % (ref 0–5)
HCT: 38.5 % (ref 36.0–46.0)
Hemoglobin: 13.3 g/dL (ref 12.0–15.0)
LYMPHS ABS: 1.7 10*3/uL (ref 0.7–4.0)
LYMPHS PCT: 19 % (ref 12–46)
MCH: 30.6 pg (ref 26.0–34.0)
MCHC: 34.5 g/dL (ref 30.0–36.0)
MCV: 88.5 fL (ref 78.0–100.0)
Monocytes Absolute: 0.6 10*3/uL (ref 0.1–1.0)
Monocytes Relative: 6 % (ref 3–12)
NEUTROS PCT: 74 % (ref 43–77)
Neutro Abs: 6.7 10*3/uL (ref 1.7–7.7)
PLATELETS: 285 10*3/uL (ref 150–400)
RBC: 4.35 MIL/uL (ref 3.87–5.11)
RDW: 13.4 % (ref 11.5–15.5)
WBC: 9.1 10*3/uL (ref 4.0–10.5)

## 2014-03-15 LAB — I-STAT TROPONIN, ED: Troponin i, poc: 0 ng/mL (ref 0.00–0.08)

## 2014-03-15 LAB — LIPASE, BLOOD: LIPASE: 200 U/L — AB (ref 11–59)

## 2014-03-15 MED ORDER — DOCUSATE SODIUM 100 MG PO CAPS: 100.0000 mg | ORAL_CAPSULE | Freq: Two times a day (BID) | ORAL | Status: DC

## 2014-03-15 MED ORDER — DIPHENHYDRAMINE HCL 50 MG/ML IJ SOLN
25.0000 mg | Freq: Once | INTRAMUSCULAR | Status: AC
  Administered 2014-03-15: 25 mg via INTRAVENOUS
  Filled 2014-03-15: qty 1

## 2014-03-15 MED ORDER — OXYCODONE-ACETAMINOPHEN 5-325 MG PO TABS
1.0000 | ORAL_TABLET | ORAL | Status: AC | PRN
Start: 2014-03-15 — End: ?

## 2014-03-15 MED ORDER — SODIUM CHLORIDE 0.9 % IV BOLUS (SEPSIS)
1000.0000 mL | Freq: Once | INTRAVENOUS | Status: AC
  Administered 2014-03-15: 1000 mL via INTRAVENOUS

## 2014-03-15 MED ORDER — METOCLOPRAMIDE HCL 5 MG/ML IJ SOLN
10.0000 mg | Freq: Once | INTRAMUSCULAR | Status: AC
  Administered 2014-03-15: 10 mg via INTRAVENOUS
  Filled 2014-03-15: qty 2

## 2014-03-15 MED ORDER — INSULIN ASPART 100 UNIT/ML ~~LOC~~ SOLN
10.0000 [IU] | Freq: Once | SUBCUTANEOUS | Status: AC
  Administered 2014-03-15: 10 [IU] via INTRAVENOUS
  Filled 2014-03-15: qty 1

## 2014-03-15 MED ORDER — MORPHINE SULFATE 4 MG/ML IJ SOLN
4.0000 mg | Freq: Once | INTRAMUSCULAR | Status: AC
  Administered 2014-03-15: 4 mg via INTRAVENOUS
  Filled 2014-03-15: qty 1

## 2014-03-15 MED ORDER — ONDANSETRON HCL 4 MG/2ML IJ SOLN
4.0000 mg | Freq: Once | INTRAMUSCULAR | Status: AC
  Administered 2014-03-15: 4 mg via INTRAVENOUS
  Filled 2014-03-15: qty 2

## 2014-03-15 MED ORDER — ONDANSETRON 4 MG PO TBDP: 4.0000 mg | ORAL_TABLET | Freq: Three times a day (TID) | ORAL | Status: DC | PRN

## 2014-03-15 NOTE — ED Notes (Signed)
Patient was put on bedpan prior to my arrival. Just came to check on patient and  Patient was unsuccessful at voiding. Will try once fluids have gone through more.

## 2014-03-15 NOTE — ED Notes (Addendum)
Awake. Verbally responsive. Resp even and unlabored. No audible adventitious breath sounds noted. ABC's intact. Diffuse abd soft/nondistended but tender to palpate. BS (+) and active x4 quadrants. No V/D reported but has nausea.

## 2014-03-15 NOTE — Telephone Encounter (Signed)
[facility] is calling about patient and needs to obtain reports EMG-NCS consistant with [facility].  Also following up on order request for dispensing record for prescription from  last week   Please call.

## 2014-03-15 NOTE — ED Notes (Signed)
Pt states she is having abd pain for the past 5 days and yesterday and today she has been running a low grade fever  Pt c/o nausea  Pt has an appt with GI the end of the month but pain is too bad to wait

## 2014-03-15 NOTE — Discharge Instructions (Signed)
I recommend that you increase your water intake, take MiraLAX and Colace over-the-counter for constipation. Please follow up with your primary care physician. If pain worsens and you cannot tolerate fluids, begin vomiting, high fever please return to emergency department. It is also very important that you keep your blood sugar under control. Please take your medications as prescribed by your primary care physician.   Acute Pancreatitis Acute pancreatitis is a disease in which the pancreas becomes suddenly inflamed. The pancreas is a large gland located behind your stomach. The pancreas produces enzymes that help digest food. The pancreas also releases the hormones glucagon and insulin that help regulate blood sugar. Damage to the pancreas occurs when the digestive enzymes from the pancreas are activated and begin attacking the pancreas before being released into the intestine. Most acute attacks last a couple of days and can cause serious complications. Some people become dehydrated and develop low blood pressure. In severe cases, bleeding into the pancreas can lead to shock and can be life-threatening. The lungs, heart, and kidneys may fail. CAUSES  Pancreatitis can happen to anyone. In some cases, the cause is unknown. Most cases are caused by:  Alcohol abuse.  Gallstones. Other less common causes are:  Certain medicines.  Exposure to certain chemicals.  Infection.  Damage caused by an accident (trauma).  Abdominal surgery. SYMPTOMS   Pain in the upper abdomen that may radiate to the back.  Tenderness and swelling of the abdomen.  Nausea and vomiting. DIAGNOSIS  Your caregiver will perform a physical exam. Blood and stool tests may be done to confirm the diagnosis. Imaging tests may also be done, such as X-rays, CT scans, or an ultrasound of the abdomen. TREATMENT  Treatment usually requires a stay in the hospital. Treatment may include:  Pain medicine.  Fluid replacement  through an intravenous line (IV).  Placing a tube in the stomach to remove stomach contents and control vomiting.  Not eating for 3 or 4 days. This gives your pancreas a rest, because enzymes are not being produced that can cause further damage.  Antibiotic medicines if your condition is caused by an infection.  Surgery of the pancreas or gallbladder. HOME CARE INSTRUCTIONS   Follow the diet advised by your caregiver. This may involve avoiding alcohol and decreasing the amount of fat in your diet.  Eat smaller, more frequent meals. This reduces the amount of digestive juices the pancreas produces.  Drink enough fluids to keep your urine clear or pale yellow.  Only take over-the-counter or prescription medicines as directed by your caregiver.  Avoid drinking alcohol if it caused your condition.  Do not smoke.  Get plenty of rest.  Check your blood sugar at home as directed by your caregiver.  Keep all follow-up appointments as directed by your caregiver. SEEK MEDICAL CARE IF:   You do not recover as quickly as expected.  You develop new or worsening symptoms.  You have persistent pain, weakness, or nausea.  You recover and then have another episode of pain. SEEK IMMEDIATE MEDICAL CARE IF:   You are unable to eat or keep fluids down.  Your pain becomes severe.  You have a fever or persistent symptoms for more than 2 to 3 days.  You have a fever and your symptoms suddenly get worse.  Your skin or the white part of your eyes turn yellow (jaundice).  You develop vomiting.  You feel dizzy, or you faint.  Your blood sugar is high (over 300 mg/dL). MAKE  SURE YOU:   Understand these instructions.  Will watch your condition.  Will get help right away if you are not doing well or get worse. Document Released: 01/29/2005 Document Revised: 07/31/2011 Document Reviewed: 05/10/2011 [facility] Patient Information 2015 [place], [place]. This information is not intended to  replace advice given to you by your health care provider. Make sure you discuss any questions you have with your health care provider.  Hyperglycemia Hyperglycemia occurs when the glucose (sugar) in your blood is too high. Hyperglycemia can happen for many reasons, but it most often happens to people who do not know they have diabetes or are not managing their diabetes properly.  CAUSES  Whether you have diabetes or not, there are other causes of hyperglycemia. Hyperglycemia can occur when you have diabetes, but it can also occur in other situations that you might not be as aware of, such as: Diabetes  If you have diabetes and are having problems controlling your blood glucose, hyperglycemia could occur because of some of the following reasons:  Not following your meal plan.  Not taking your diabetes medications or not taking it properly.  Exercising less or doing less activity than you normally do.  Being sick. Pre-diabetes  This cannot be ignored. Before people develop Type 2 diabetes, they almost always have "pre-diabetes." This is when your blood glucose levels are higher than normal, but not yet high enough to be diagnosed as diabetes. Research has shown that some long-term damage to the body, especially the heart and circulatory system, may already be occurring during pre-diabetes. If you take action to manage your blood glucose when you have pre-diabetes, you may delay or prevent Type 2 diabetes from developing. Stress  If you have diabetes, you may be "diet" controlled or on oral medications or insulin to control your diabetes. However, you may find that your blood glucose is higher than usual in the hospital whether you have diabetes or not. This is often referred to as "stress hyperglycemia." Stress can elevate your blood glucose. This happens because of hormones put out by the body during times of stress. If stress has been the cause of your high blood glucose, it can be followed  regularly by your caregiver. That way he/she can make sure your hyperglycemia does not continue to get worse or progress to diabetes. Steroids  Steroids are medications that act on the infection fighting system (immune system) to block inflammation or infection. One side effect can be a rise in blood glucose. Most people can produce enough extra insulin to allow for this rise, but for those who cannot, steroids make blood glucose levels go even higher. It is not unusual for steroid treatments to "uncover" diabetes that is developing. It is not always possible to determine if the hyperglycemia will go away after the steroids are stopped. A special blood test called an A1c is sometimes done to determine if your blood glucose was elevated before the steroids were started. SYMPTOMS  Thirsty.  Frequent urination.  Dry mouth.  Blurred vision.  Tired or fatigue.  Weakness.  Sleepy.  Tingling in feet or leg. DIAGNOSIS  Diagnosis is made by monitoring blood glucose in one or all of the following ways:  A1c test. This is a chemical found in your blood.  Fingerstick blood glucose monitoring.  Laboratory results. TREATMENT  First, knowing the cause of the hyperglycemia is important before the hyperglycemia can be treated. Treatment may include, but is not be limited to:  Education.  Change or  adjustment in medications.  Change or adjustment in meal plan.  Treatment for an illness, infection, etc.  More frequent blood glucose monitoring.  Change in exercise plan.  Decreasing or stopping steroids.  Lifestyle changes. HOME CARE INSTRUCTIONS   Test your blood glucose as directed.  Exercise regularly. Your caregiver will give you instructions about exercise. Pre-diabetes or diabetes which comes on with stress is helped by exercising.  Eat wholesome, balanced meals. Eat often and at regular, fixed times. Your caregiver or nutritionist will give you a meal plan to guide your sugar  intake.  Being at an ideal weight is important. If needed, losing as little as 10 to 15 pounds may help improve blood glucose levels. SEEK MEDICAL CARE IF:   You have questions about medicine, activity, or diet.  You continue to have symptoms (problems such as increased thirst, urination, or weight gain). SEEK IMMEDIATE MEDICAL CARE IF:   You are vomiting or have diarrhea.  Your breath smells fruity.  You are breathing faster or slower.  You are very sleepy or incoherent.  You have numbness, tingling, or pain in your feet or hands.  You have chest pain.  Your symptoms get worse even though you have been following your caregiver's orders.  If you have any other questions or concerns. Document Released: 07/25/2000 Document Revised: 04/23/2011 Document Reviewed: 05/28/2011 [facility] Patient Information 2015 [place], [place]. This information is not intended to replace advice given to you by your health care provider. Make sure you discuss any questions you have with your health care provider.  Constipation Constipation is when a person has fewer than three bowel movements a week, has difficulty having a bowel movement, or has stools that are dry, hard, or larger than normal. As people grow older, constipation is more common. If you try to fix constipation with medicines that make you have a bowel movement (laxatives), the problem may get worse. Long-term laxative use may cause the muscles of the colon to become weak. A low-fiber diet, not taking in enough fluids, and taking certain medicines may make constipation worse.  CAUSES   Certain medicines, such as antidepressants, pain medicine, iron supplements, antacids, and water pills.   Certain diseases, such as diabetes, irritable bowel syndrome (IBS), thyroid disease, or depression.   Not drinking enough water.   Not eating enough fiber-rich foods.   Stress or travel.   Lack of physical activity or exercise.   Ignoring the  urge to have a bowel movement.   Using laxatives too much.  SIGNS AND SYMPTOMS   Having fewer than three bowel movements a week.   Straining to have a bowel movement.   Having stools that are hard, dry, or larger than normal.   Feeling full or bloated.   Pain in the lower abdomen.   Not feeling relief after having a bowel movement.  DIAGNOSIS  Your health care provider will take a medical history and perform a physical exam. Further testing may be done for severe constipation. Some tests may include:  A barium enema X-ray to examine your rectum, colon, and, sometimes, your small intestine.   A sigmoidoscopy to examine your lower colon.   A colonoscopy to examine your entire colon. TREATMENT  Treatment will depend on the severity of your constipation and what is causing it. Some dietary treatments include drinking more fluids and eating more fiber-rich foods. Lifestyle treatments may include regular exercise. If these diet and lifestyle recommendations do not help, your health care provider may recommend  taking over-the-counter laxative medicines to help you have bowel movements. Prescription medicines may be prescribed if over-the-counter medicines do not work.  HOME CARE INSTRUCTIONS   Eat foods that have a lot of fiber, such as fruits, vegetables, whole grains, and beans.  Limit foods high in fat and processed sugars, such as french fries, hamburgers, cookies, candies, and soda.   A fiber supplement may be added to your diet if you cannot get enough fiber from foods.   Drink enough fluids to keep your urine clear or pale yellow.   Exercise regularly or as directed by your health care provider.   Go to the restroom when you have the urge to go. Do not hold it.   Only take over-the-counter or prescription medicines as directed by your health care provider. Do not take other medicines for constipation without talking to your health care provider first.  [place] IF:   You have bright red blood in your stool.   Your constipation lasts for more than 4 days or gets worse.   You have abdominal or rectal pain.   You have thin, pencil-like stools.   You have unexplained weight loss. MAKE SURE YOU:   Understand these instructions.  Will watch your condition.  Will get help right away if you are not doing well or get worse. Document Released: 10/28/2003 Document Revised: 02/03/2013 Document Reviewed: 11/10/2012 [facility] Patient Information 2015 [place], [place]. This information is not intended to replace advice given to you by your health care provider. Make sure you discuss any questions you have with your health care provider.

## 2014-03-15 NOTE — ED Notes (Signed)
Pt placed on bedpan in attempt to collect urine sample.  

## 2014-03-15 NOTE — ED Provider Notes (Signed)
TIME SEEN: 9:35 PM  CHIEF COMPLAINT: Abdominal pain  HPI: Pt is a 64 y.o. morbidly obese female with history of hypertension, diabetes, hyperlipidemia, PR and race syndrome, self diagnosis of gastroparesis who presents to the emergency department with complaints of intermittent abdominal pain that she has had for the past 6 months. States pain is been worse over the past 5 days. Denies any fevers or chills. She has had nausea but no vomiting or diarrhea. She is status post left nephrectomy, C-section, cholecystectomy. States she has an appointment to see a gastroenterologist at the end of this month. States pain is worse after eating. Better if she eats small meals.  ROS: See HPI Constitutional: no fever  Eyes: no drainage  ENT: no runny nose   Cardiovascular:  no chest pain  Resp: no SOB  GI: no vomiting GU: no dysuria Integumentary: no rash  Allergy: no hives  Musculoskeletal: no leg swelling  Neurological: no slurred speech ROS otherwise negative  PAST MEDICAL HISTORY/PAST SURGICAL HISTORY:  Past Medical History  Diagnosis Date  . Diabetes mellitus     2  . Hyperlipidemia   . Hypertension   . Renal mass     BEING WORKED UP BY UROLOGY  . Chronic inflammatory demyelinating polyneuritis   . Guillain-Barre syndrome   . Renal disorder     L kidney removed  . Cancer   . 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 Authorizing Provider  ALPRAZolam Duanne Moron) 0.25 MG tablet  01/21/14   Historical Provider, MD  amitriptyline (ELAVIL) 10 MG tablet TAKE 4 TABS AT BEDTIME  FOR 1 WEEK THEN INCREASE TO 5 AT BEDTIME IF NECESSARY 11/12/13   [name], MD  aspirin EC 81 MG tablet Take 81 mg by mouth daily.    Historical Provider, MD  calcium citrate (CALCITRATE - DOSED IN MG ELEMENTAL CALCIUM) 950 MG tablet Take 200 mg of elemental calcium by mouth daily.    Historical Provider, MD   cholecalciferol (VITAMIN D) 1000 UNITS tablet Take 5,000 Units by mouth daily.    Historical Provider, MD  COD LIVER OIL PO Take 1 capsule by mouth daily.    Historical Provider, MD  gabapentin (NEURONTIN) 300 MG capsule TAKE 1 CAPSULE (300 MG TOTAL) BY MOUTH 4 (FOUR) TIMES DAILY. 02/10/14   Asencion Partridge Dohmeier, MD  glucose blood (FREESTYLE LITE) test strip Use to test blood sugar 2 times daily as instructed. Dx code: E11.29 01/26/14   Philemon Kingdom, MD  Immune Globulin, Human, 1 GM/5ML SOLN Subcutaneus use per weight , patient needs chem 7 every week. [facility] . 02/10/14   Asencion Partridge Dohmeier, MD  lisinopril (PRINIVIL,ZESTRIL) 20 MG tablet Take 20 mg by mouth daily.    Historical Provider, MD  metFORMIN (GLUCOPHAGE XR) 500 MG 24 hr tablet Take 1 tablet (500 mg total) by mouth 2 (two) times daily. 02/23/14   Philemon Kingdom, MD  metoCLOPramide (REGLAN) 10 MG tablet Take 1 tablet (10 mg total) by mouth 4 (four) times daily -  before meals and at bedtime. 12/31/13   Rosalita Chessman, DO  nateglinide (STARLIX) 120 MG tablet Take 1 tablet (120 mg total) by mouth 3 (three) times daily with meals. 01/25/14   Philemon Kingdom, MD  promethazine (PHENERGAN) 25 MG tablet TAKE 1 TABLET (25 MG TOTAL) BY MOUTH AS NEEDED as directed by dr Brett Fairy  FOR  NAUSEA. 02/06/14   [name], MD  simvastatin (ZOCOR) 40 MG tablet Take 1 tablet (40 mg total) by mouth at bedtime. 01/06/14   Alferd Apa Lowne, DO  Suvorexant (BELSOMRA) 10 MG TABS Take 1 tablet by mouth at bedtime. 02/18/14   Carmen Dohmeier, MD  Suvorexant (BELSOMRA) 15 MG TABS Take 15 mg by mouth Nightly. 02/11/14   [name], MD  traMADol (ULTRAM) 50 MG tablet Take 1-2 tablets (50-100 mg total) by mouth every 6 (six) hours as needed for moderate pain or severe pain. Take 1 tablet in the morning and 2 tablets at night 02/10/14   [name], MD    ALLERGIES:  Allergies  Allergen Reactions  . Biaxin [Clarithromycin] Other (See Comments)     "face turns red"  . Codeine Nausea And Vomiting and Other (See Comments)    Hallucinations  . Erythromycin Nausea And Vomiting    SOCIAL HISTORY:  History  Substance Use Topics  . Smoking status: Former Research scientist (life sciences)  . Smokeless tobacco: Never Used  . Alcohol Use: No    FAMILY HISTORY: Family History  Problem Relation Age of Onset  . Cancer Father     PROSTATE  . Cancer Sister     BREAST  . Healthy Mother     EXAM: BP 156/59 mmHg  Pulse 101  Temp(Src) 99.1 F (37.3 C) (Oral)  Resp 18  SpO2 95% CONSTITUTIONAL: Alert and oriented and responds appropriately to questions. Well-appearing; well-nourished, morbidly obese HEAD: Normocephalic EYES: Conjunctivae clear, PERRL ENT: normal nose; no rhinorrhea; moist mucous membranes; pharynx without lesions noted NECK: Supple, no meningismus, no LAD  CARD: RRR; S1 and S2 appreciated; no murmurs, no clicks, no rubs, no gallops RESP: Normal chest excursion without splinting or tachypnea; breath sounds clear and equal bilaterally; no wheezes, no rhonchi, no rales,  ABD/GI: Normal bowel sounds; non-distended; soft, non-tender, no rebound, no guarding; no peritoneal signs, no distention, no tympany or fluid wave BACK:  The back appears normal and is non-tender to palpation, there is no CVA tenderness EXT: Normal ROM in all joints; non-tender to palpation; no edema; normal capillary refill; no cyanosis    SKIN: Normal color for age and race; warm NEURO: Moves all extremities equally PSYCH: The patient's mood and manner are appropriate. Grooming and personal hygiene are appropriate.  MEDICAL DECISION MAKING: Patient here with upper abdominal pain is worse after eating. She has a very benign abdominal exam on exam and appears very comfortable in the ED. Pain is been present intermittently for 6 months. Will obtain abdominal labs, urine, acute abdominal series. I do not feel this time she needs a CT scan of her abdomen and pelvis given her benign  exam. Will give morphine, Dilaudid, IV fluids and reassess.  ED PROGRESS: Patient has a lipase of 200. Otherwise LFTs unremarkable. Creatinine mildly elevated but she is receiving IV fluids. She does have a glucose of 595 but normal bicarbonate 24, anion gap 13. No DKA. She is on medication for diabetes and reports she has been taking them. She is not on insulin. Discussed with patient watching her diet and discussing this with her primary care doctor to have her glucose followed closely. Discussed that this may be a reason of her having pancreatitis and upper abdominal pain and nausea. Have offered admission the patient reports she would really like to go home. She states she is feeling much better and has not had any vomiting. We'll give second liter IV fluid bolus, 10 units of regular  IV insulin and recheck blood sugar. We'll also by mouth challenge.  Once glucose has improved below 400 and patient is starting by mouth we'll discharge home.    EKG Interpretation  Date/Time:  Monday March 15 2014 21:49:36 EST Ventricular Rate:  91 PR Interval:  137 QRS Duration: 95 QT Interval:  351 QTC Calculation: 432 R Axis:   79 Text Interpretation:  Sinus rhythm Abnormal T, consider ischemia, lateral leads No significant change since last tracing Confirmed by Kaedyn Polivka,  DO, Tricia Pledger [phone]) on 03/15/2014 9:51:38 PM        [place], DO 03/16/14 [id number]

## 2014-03-15 NOTE — ED Notes (Signed)
Applied O2 at 2lpm via [place] d/t pt desating to 88% RA after given Morphine. Resp even and unlabored. No audible adventitious breath sounds noted. Auscultated clear breath sounds.

## 2014-03-15 NOTE — ED Notes (Signed)
Patient repositioned to a position of comfort, patient upright drinking water at this time, no needs voiced

## 2014-03-16 LAB — URINALYSIS, ROUTINE W REFLEX MICROSCOPIC
BILIRUBIN URINE: NEGATIVE
Glucose, UA: >1000 mg/dL — AB
HGB URINE DIPSTICK: NEGATIVE
KETONES UR: NEGATIVE mg/dL
Leukocytes, UA: NEGATIVE
Nitrite: NEGATIVE
PH: 5.5 (ref 5.0–8.0)
Protein, ur: 100 mg/dL — AB
Specific Gravity, Urine: 1.031 — ABNORMAL HIGH (ref 1.005–1.030)
Urobilinogen, UA: 0.2 mg/dL (ref 0.0–1.0)

## 2014-03-16 LAB — CBG MONITORING, ED
GLUCOSE-CAPILLARY: 412 mg/dL — AB (ref 70–99)
Glucose-Capillary: 289 mg/dL — ABNORMAL HIGH (ref 70–99)

## 2014-03-16 LAB — URINE MICROSCOPIC-ADD ON

## 2014-03-16 NOTE — Telephone Encounter (Signed)
I called and LMVM for [place] with [place] relating still waiting on answer for Hizentra dosing.

## 2014-03-16 NOTE — ED Notes (Signed)
Awake. Verbally responsive. A/O x4. Resp even and unlabored. No audible adventitious breath sounds noted. ABC's intact. IV infusing NS at 740ml/hr without difficulty. No s/s of hypo/hyperglycemia noted.

## 2014-03-16 NOTE — ED Notes (Signed)
Awake. Verbally responsive. A/O x4. Resp even and unlabored. No audible adventitious breath sounds noted. ABC's intact. IV infusing NS #2 at 753ml/hr without difficulty.

## 2014-03-16 NOTE — ED Notes (Signed)
Awake. Verbally responsive. A/O x4. Resp even and unlabored. No audible adventitious breath sounds noted. ABC's intact. SR on monitor at 81bpm. Sats at 94% RA

## 2014-03-16 NOTE — ED Notes (Signed)
Resting quietly with eye closed. Easily arousable. Verbally responsive. Resp even and unlabored. ABC's intact. IV infusing NS. Removed O2 at 2lpm via [place] with sats at 96%RA. NAD noted.

## 2014-03-17 ENCOUNTER — Other Ambulatory Visit: Payer: Self-pay

## 2014-03-17 MED ORDER — AMITRIPTYLINE HCL 10 MG PO TABS: ORAL_TABLET | ORAL | Status: DC

## 2014-03-17 MED ORDER — IMMUNE GLOBULIN (HUMAN) 10 GM/50ML ~~LOC~~ SOLN: SUBCUTANEOUS | Status: DC

## 2014-03-18 ENCOUNTER — Other Ambulatory Visit: Payer: Self-pay | Admitting: Family Medicine

## 2014-03-18 ENCOUNTER — Encounter: Payer: Self-pay | Admitting: Internal Medicine

## 2014-03-18 NOTE — Telephone Encounter (Signed)
Faxed to [facility] (successful) [phone] new prescription.   Relayed in email to [place] that was faxed.

## 2014-03-19 NOTE — Telephone Encounter (Signed)
Ordered as suggested by pharmacy, loading dose  First , in the future monthly maintenance.

## 2014-03-22 ENCOUNTER — Other Ambulatory Visit: Payer: Self-pay | Admitting: Internal Medicine

## 2014-03-22 NOTE — Telephone Encounter (Signed)
I faxed to [facility] the loading dose order.   She relayed that they need EMG/NCS results.  I have looked in [facility] when initially diagnosed with G. Barre.  I donot see this as being done.

## 2014-03-23 ENCOUNTER — Other Ambulatory Visit: Payer: Medicare Other

## 2014-03-24 ENCOUNTER — Telehealth: Payer: Self-pay | Admitting: Neurology

## 2014-03-24 DIAGNOSIS — G6181 Chronic inflammatory demyelinating polyneuritis: Secondary | ICD-10-CM

## 2014-03-24 NOTE — Telephone Encounter (Signed)
I called pt and LMVM for her to call me back about having NCS/EMG when first diagnosed.

## 2014-03-24 NOTE — Telephone Encounter (Signed)
The patient will be asked to undergo a nerve conduction study EMG as this is necessary to support her claim for injectable immunoglobin therapy. She advised by nurse today by phone that she suffers from an acute pancreatitis. As shown a she is doing better and I understand that right now she will be NPO - we will do the EMG and nerve conduction study, later -  I tentatively scheduled  for 10 -14 days from now.

## 2014-03-24 NOTE — Telephone Encounter (Signed)
Thank you Dr. Brett Fairy, happy to do it anytime! Let us know if you need Emma's assistance. Thank you.

## 2014-03-25 NOTE — Telephone Encounter (Signed)
I called and LMVM for pt regarding to still proceed with [place]/EMG.  Pt to call back if questions.

## 2014-03-25 NOTE — Telephone Encounter (Signed)
Pt is returning your call states she has some questions for you.  Please call and advise.

## 2014-03-25 NOTE — Telephone Encounter (Signed)
I called pt and she has questions about cost of test [place]/EMG. Scheduled 04-13-14.   Will touch base with accounting.  Also Seeing Laurence Spates, MD [facility] on 04-06-14 at 1300.

## 2014-03-25 NOTE — Telephone Encounter (Signed)
I spoke to pt and she has not had EMG/[place] before, that she recalls.   Pt stated she had pancreatitis recently and is still getting over this.   She wanted to check with Dr. Brett Fairy to see if would be ok to proceed with IVIG subz (hizentra).  Per Dr. Brett Fairy would still go ahead with the [place]/EMG.  I also stated that [facility] is in [place] network for hizentra.  Hopefully can get approved.

## 2014-03-26 ENCOUNTER — Encounter: Payer: Self-pay | Admitting: Family Medicine

## 2014-03-26 ENCOUNTER — Other Ambulatory Visit: Payer: Self-pay | Admitting: Family Medicine

## 2014-03-26 ENCOUNTER — Encounter: Payer: Self-pay | Admitting: *Deleted

## 2014-03-26 DIAGNOSIS — K858 Other acute pancreatitis without necrosis or infection: Secondary | ICD-10-CM

## 2014-03-26 NOTE — Telephone Encounter (Signed)
Speaking with billing, Angie.   Her responsibility would be most likely less then $100.00.  I emailed her on mychart.

## 2014-03-26 NOTE — Telephone Encounter (Signed)
I put order in ,   Make sure they know to do other labs in epic as well

## 2014-03-30 NOTE — Telephone Encounter (Signed)
I spoke with Parks Ranger, with [place] relating to pts IVIG to let her know that pt had not had NCS/EMG.  Pt is scheduled for this 04-13-14 in our office.  [facility] appreciated call.   Will update PA team with Axela.  She will ask if anything more is needed then will call me back.    She is also scheduled for GI consult with Laurence Spates, MD 04-06-14 at 1300.

## 2014-04-01 ENCOUNTER — Other Ambulatory Visit (INDEPENDENT_AMBULATORY_CARE_PROVIDER_SITE_OTHER): Payer: Medicare Other

## 2014-04-01 DIAGNOSIS — Z79891 Long term (current) use of opiate analgesic: Secondary | ICD-10-CM | POA: Diagnosis not present

## 2014-04-01 DIAGNOSIS — K858 Other acute pancreatitis without necrosis or infection: Secondary | ICD-10-CM

## 2014-04-01 DIAGNOSIS — Z79899 Other long term (current) drug therapy: Secondary | ICD-10-CM | POA: Diagnosis not present

## 2014-04-01 LAB — AMYLASE: Amylase: 46 U/L (ref 27–131)

## 2014-04-01 LAB — LIPASE: Lipase: 185 U/L — ABNORMAL HIGH (ref 11.0–59.0)

## 2014-04-02 ENCOUNTER — Encounter: Payer: Self-pay | Admitting: Family Medicine

## 2014-04-06 ENCOUNTER — Emergency Department (HOSPITAL_COMMUNITY)
Admission: EM | Admit: 2014-04-06 | Discharge: 2014-04-07 | Disposition: A | Discharge status: Home / Self Care | Payer: Medicare Other | Attending: Emergency Medicine | Admitting: Emergency Medicine

## 2014-04-06 ENCOUNTER — Encounter (HOSPITAL_COMMUNITY): Payer: Self-pay | Admitting: Emergency Medicine

## 2014-04-06 ENCOUNTER — Other Ambulatory Visit: Payer: Self-pay | Admitting: Gastroenterology

## 2014-04-06 DIAGNOSIS — Z87891 Personal history of nicotine dependence: Secondary | ICD-10-CM | POA: Insufficient documentation

## 2014-04-06 DIAGNOSIS — Z859 Personal history of malignant neoplasm, unspecified: Secondary | ICD-10-CM | POA: Diagnosis not present

## 2014-04-06 DIAGNOSIS — E1165 Type 2 diabetes mellitus with hyperglycemia: Secondary | ICD-10-CM | POA: Diagnosis not present

## 2014-04-06 DIAGNOSIS — Z79899 Other long term (current) drug therapy: Secondary | ICD-10-CM | POA: Insufficient documentation

## 2014-04-06 DIAGNOSIS — R933 Abnormal findings on diagnostic imaging of other parts of digestive tract: Secondary | ICD-10-CM | POA: Diagnosis not present

## 2014-04-06 DIAGNOSIS — K859 Acute pancreatitis, unspecified: Secondary | ICD-10-CM | POA: Diagnosis not present

## 2014-04-06 DIAGNOSIS — R1010 Upper abdominal pain, unspecified: Secondary | ICD-10-CM | POA: Insufficient documentation

## 2014-04-06 DIAGNOSIS — Z87448 Personal history of other diseases of urinary system: Secondary | ICD-10-CM | POA: Diagnosis not present

## 2014-04-06 DIAGNOSIS — I10 Essential (primary) hypertension: Secondary | ICD-10-CM | POA: Diagnosis not present

## 2014-04-06 DIAGNOSIS — Z7982 Long term (current) use of aspirin: Secondary | ICD-10-CM | POA: Diagnosis not present

## 2014-04-06 DIAGNOSIS — R739 Hyperglycemia, unspecified: Secondary | ICD-10-CM | POA: Diagnosis present

## 2014-04-06 DIAGNOSIS — G6181 Chronic inflammatory demyelinating polyneuritis: Secondary | ICD-10-CM | POA: Diagnosis not present

## 2014-04-06 DIAGNOSIS — R748 Abnormal levels of other serum enzymes: Secondary | ICD-10-CM | POA: Diagnosis not present

## 2014-04-06 DIAGNOSIS — Z9049 Acquired absence of other specified parts of digestive tract: Secondary | ICD-10-CM | POA: Diagnosis not present

## 2014-04-06 DIAGNOSIS — K858 Other acute pancreatitis without necrosis or infection: Secondary | ICD-10-CM

## 2014-04-06 LAB — COMPREHENSIVE METABOLIC PANEL
ALK PHOS: 82 U/L (ref 39–117)
ALT: 19 U/L (ref 0–35)
ANION GAP: 12 (ref 5–15)
AST: 28 U/L (ref 0–37)
Albumin: 3.7 g/dL (ref 3.5–5.2)
BILIRUBIN TOTAL: 0.7 mg/dL (ref 0.3–1.2)
BUN: 27 mg/dL — AB (ref 6–23)
CHLORIDE: 91 mmol/L — AB (ref 96–112)
CO2: 25 mmol/L (ref 19–32)
Calcium: 8.8 mg/dL (ref 8.4–10.5)
Creatinine, Ser: 1.36 mg/dL — ABNORMAL HIGH (ref 0.50–1.10)
GFR calc Af Amer: 46 mL/min — ABNORMAL LOW (ref >90)
GFR calc non Af Amer: 40 mL/min — ABNORMAL LOW (ref >90)
GLUCOSE: 598 mg/dL — AB (ref 70–99)
POTASSIUM: 4.8 mmol/L (ref 3.5–5.1)
SODIUM: 128 mmol/L — AB (ref 135–145)
TOTAL PROTEIN: 6.4 g/dL (ref 6.0–8.3)

## 2014-04-06 LAB — CBC
HEMATOCRIT: 33.5 % — AB (ref 36.0–46.0)
HEMOGLOBIN: 11.6 g/dL — AB (ref 12.0–15.0)
MCH: 30.6 pg (ref 26.0–34.0)
MCHC: 34.6 g/dL (ref 30.0–36.0)
MCV: 88.4 fL (ref 78.0–100.0)
Platelets: UNDETERMINED 10*3/uL (ref 150–400)
RBC: 3.79 MIL/uL — ABNORMAL LOW (ref 3.87–5.11)
RDW: 13.6 % (ref 11.5–15.5)
WBC: 5.5 10*3/uL (ref 4.0–10.5)

## 2014-04-06 LAB — CBG MONITORING, ED: Glucose-Capillary: >600 mg/dL (ref 70–99)

## 2014-04-06 LAB — LIPASE, BLOOD: LIPASE: 131 U/L — AB (ref 11–59)

## 2014-04-06 MED ORDER — SODIUM CHLORIDE 0.9 % IV BOLUS (SEPSIS): 1000.0000 mL | Freq: Once | INTRAVENOUS | Status: DC

## 2014-04-06 MED ORDER — SODIUM CHLORIDE 0.9 % IV BOLUS (SEPSIS)
1000.0000 mL | Freq: Once | INTRAVENOUS | Status: AC
  Administered 2014-04-06: 1000 mL via INTRAVENOUS

## 2014-04-06 NOTE — ED Notes (Signed)
Pt was seen at  MD office earlier today and was called with a result of glucose 500. Pt was told to come to ED by MD. Pt recently dx with pancreatitis. Pt is alert and oriented.

## 2014-04-06 NOTE — ED Provider Notes (Signed)
CSN: [id number]     Arrival date & time 04/06/14  2052 History   First MD Initiated Contact with Patient 04/06/14 2116     Chief Complaint  Patient presents with  . Hyperglycemia     (Consider location/radiation/quality/duration/timing/severity/associated sxs/prior Treatment) HPI Comments: Patient with history of diabetes, on metformin and nateglinide, gastroparesis -- presents with c/o elevated blood sugar. Patient was noted to have elevated blood sugars on labs performed by her GI doctor today. Patient continues to have waxing and waning upper abdominal pain which she was told was pancreatitis after a previous emergency department visit. She does not have any nausea, vomiting, or diarrhea. She has not had fever. No urinary symptoms. She does not report increased urination. No history of DKA. The onset of this condition was insidious. The course is constant. Aggravating factors: none. Alleviating factors: none.   She has been seen by GI, scheduled for abdominal MRI next week.     The history is provided by the patient and medical records.    Past Medical History  Diagnosis Date  . Diabetes mellitus     2  . Hyperlipidemia   . Hypertension   . Renal mass     BEING WORKED UP BY UROLOGY  . Chronic inflammatory demyelinating polyneuritis   . Guillain-Barre syndrome   . Renal disorder     L kidney removed  . Cancer   . 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 Cesarean section    . Cholecystectomy    . Nephrectomy     Family History  Problem Relation Age of Onset  . Cancer Father     PROSTATE  . Cancer Sister     BREAST  . Healthy Mother    History  Substance Use Topics  . Smoking status: Former Research scientist (life sciences)  . Smokeless tobacco: Never Used  . Alcohol Use: No   OB History    No data available     Review of Systems  Constitutional: Negative for fever.  HENT: Negative for rhinorrhea and sore throat.   Eyes:  Negative for redness.  Respiratory: Negative for cough and shortness of breath.   Cardiovascular: Negative for chest pain.  Gastrointestinal: Positive for abdominal pain. Negative for nausea, vomiting and diarrhea.  Endocrine: Negative for polydipsia and polyuria.  Genitourinary: Negative for dysuria and frequency.  Musculoskeletal: Negative for myalgias.  Skin: Negative for rash.  Neurological: Negative for headaches.    Allergies  Biaxin; Codeine; and Erythromycin  Home Medications   Prior to Admission medications   Medication Sig Start Date End Date Taking? Authorizing Provider  ALPRAZolam (XANAX) 0.25 MG tablet Take 0.25 mg by mouth 3 (three) times daily as needed for anxiety.  01/21/14   Historical Provider, MD  amitriptyline (ELAVIL) 10 MG tablet Take 40 mg by mouth at bedtime.    Historical Provider, MD  amitriptyline (ELAVIL) 10 MG tablet TAKE 4 TABS AT BEDTIME  FOR 1 WEEK THEN INCREASE TO 5 AT BEDTIME IF NECESSARY 03/17/14   [name], MD  aspirin EC 81 MG tablet Take 81 mg by mouth daily.    Historical Provider, MD  calcium citrate (CALCITRATE - DOSED IN MG ELEMENTAL CALCIUM) 950 MG tablet Take 200 mg of elemental calcium by mouth daily.    Historical Provider, MD  cholecalciferol (VITAMIN D) 1000 UNITS tablet Take 5,000 Units by mouth daily.    Historical Provider, MD  COD  LIVER OIL PO Take 1 capsule by mouth daily.    Historical Provider, MD  docusate sodium (COLACE) 100 MG capsule Take 1 capsule (100 mg total) by mouth every 12 (twelve) hours. 03/15/14   Kristen N Ward, DO  gabapentin (NEURONTIN) 300 MG capsule TAKE 1 CAPSULE (300 MG TOTAL) BY MOUTH 4 (FOUR) TIMES DAILY. 02/10/14   Asencion Partridge Dohmeier, MD  glucose blood (FREESTYLE LITE) test strip Use to test blood sugar 2 times daily as instructed. Dx code: E11.29 01/26/14   Philemon Kingdom, MD  Immune Globulin, Human, 1 GM/5ML SOLN Subcutaneus use per weight , patient needs chem 7 every week. [facility] . 02/10/14    Asencion Partridge Dohmeier, MD  Immune Globulin, Human, 10 GM/50ML SOLN Initial loading dose 2 g/kg of high central 179 g 4 times weekly dispensed as 272 g with 0 refills. 03/17/14   Asencion Partridge Dohmeier, MD  lisinopril (PRINIVIL,ZESTRIL) 20 MG tablet Take 20 mg by mouth daily.    Historical Provider, MD  metFORMIN (GLUCOPHAGE-XR) 500 MG 24 hr tablet TAKE 1 TABLET (500 MG TOTAL) BY MOUTH 2 (TWO) TIMES DAILY. 03/22/14   Philemon Kingdom, MD  metoCLOPramide (REGLAN) 10 MG tablet Take 1 tablet (10 mg total) by mouth 4 (four) times daily -  before meals and at bedtime. 12/31/13   Rosalita Chessman, DO  nateglinide (STARLIX) 120 MG tablet Take 1 tablet (120 mg total) by mouth 3 (three) times daily with meals. 01/25/14   Philemon Kingdom, MD  ondansetron (ZOFRAN ODT) 4 MG disintegrating tablet Take 1 tablet (4 mg total) by mouth every 8 (eight) hours as needed for nausea or vomiting. 03/15/14   Kristen N Ward, DO  oxyCODONE-acetaminophen (PERCOCET/ROXICET) 5-325 MG per tablet Take 1 tablet by mouth every 4 (four) hours as needed. 03/15/14   Kristen N Ward, DO  promethazine (PHENERGAN) 25 MG tablet TAKE 1 TABLET (25 MG TOTAL) BY MOUTH AS NEEDED as directed by dr dohmeier  FOR NAUSEA. 02/06/14   [name], MD  simvastatin (ZOCOR) 40 MG tablet Take 1 tablet (40 mg total) by mouth daily. Repeat labs are due 03/19/14   Rosalita Chessman, DO  Suvorexant (BELSOMRA) 10 MG TABS Take 1 tablet by mouth at bedtime. 02/18/14   Carmen Dohmeier, MD  Suvorexant (BELSOMRA) 15 MG TABS Take 15 mg by mouth Nightly. 02/11/14   [name], MD  traMADol (ULTRAM) 50 MG tablet Take 1-2 tablets (50-100 mg total) by mouth every 6 (six) hours as needed for moderate pain or severe pain. Take 1 tablet in the morning and 2 tablets at night 02/10/14   Asencion Partridge Dohmeier, MD   BP 172/87 mmHg  Pulse 76  Temp(Src) 98.3 F (36.8 C) (Oral)  Resp 15  SpO2 98%   Physical Exam  Constitutional: She appears well-developed and well-nourished.  HENT:  Head:  Normocephalic and atraumatic.  Eyes: Conjunctivae are normal. Right eye exhibits no discharge. Left eye exhibits no discharge.  Neck: Normal range of motion. Neck supple.  Cardiovascular: Normal rate, regular rhythm and normal heart sounds.   No murmur heard. Pulmonary/Chest: Effort normal and breath sounds normal. No respiratory distress. She has no wheezes. She has no rales.  Abdominal: Soft. There is tenderness (mild upper abdominal pain). There is no rebound and no guarding.  Neurological: She is alert.  Skin: Skin is warm and dry.  Psychiatric: She has a normal mood and affect.  Nursing note and vitals reviewed.   ED Course  Procedures (including critical care time) Labs  Review Labs Reviewed  CBC - Abnormal; Notable for the following:    RBC 3.79 (*)    Hemoglobin 11.6 (*)    HCT 33.5 (*)    All other components within normal limits  COMPREHENSIVE METABOLIC PANEL - Abnormal; Notable for the following:    Sodium 128 (*)    Chloride 91 (*)    Glucose, Bld 598 (*)    BUN 27 (*)    Creatinine, Ser 1.36 (*)    GFR calc non Af Amer 40 (*)    GFR calc Af Amer 46 (*)    All other components within normal limits  LIPASE, BLOOD - Abnormal; Notable for the following:    Lipase 131 (*)    All other components within normal limits  CBG MONITORING, ED - Abnormal; Notable for the following:    Glucose-Capillary >600 (*)    All other components within normal limits  CBG MONITORING, ED - Abnormal; Notable for the following:    Glucose-Capillary 417 (*)    All other components within normal limits  URINALYSIS, ROUTINE W REFLEX MICROSCOPIC    Imaging Review No results found.   EKG Interpretation None      9:48 PM Patient seen and examined. Work-up initiated. Medications ordered.   Vital signs reviewed and are as follows: BP 172/87 mmHg  Pulse 76  Temp(Src) 98.3 F (36.8 C) (Oral)  Resp 15  SpO2 98%   1:21 AM Patient discussed with and seen by Dr. Reather Converse.   Patient  with good response after 1 L of fluids. Her blood sugar is 417 down from 600. Will give 5 units of subcutaneous insulin and discharged home. Patient is comfortable with this plan. She has follow-up with her PCP and endocrinologist next few days. She states that she will likely be placed on insulin at her endocrinologist visit. We reviewed all labs including lipase which is trending down.  Patient urged to return with worsening symptoms or other concerns. Patient verbalized understanding and agrees with plan.     MDM   Final diagnoses:  Hyperglycemia without ketosis  Elevated lipase   Patient with hyperglycemia. No evidence of DKA. Patient was treated in emergency department with fluids. Her visit tonight was prompted by elevated blood sugar on routine labs. She continues to have some mild, waxing and waning upper abdominal pain which may be related to pancreatitis. She is having this evaluated currently by her gastroenterologist. Her symptoms tonight are not worse than usual. She does not have any uncontrolled vomiting consistent with her previous gastroparesis. No diarrhea. No urinary symptoms. No chest pain or cough. Do not suspect significant underlying infectious etiology. Patient appears well, nontoxic. Feels that she is safe for discharge at this time with appropriate follow-up as discussed above.   Carlisle Cater, PA-C 04/07/14 0125  Mariea Clonts, MD 04/07/14 [phone]

## 2014-04-07 LAB — URINALYSIS, ROUTINE W REFLEX MICROSCOPIC
BILIRUBIN URINE: NEGATIVE
Glucose, UA: >1000 mg/dL — AB
HGB URINE DIPSTICK: NEGATIVE
Ketones, ur: NEGATIVE mg/dL
Leukocytes, UA: NEGATIVE
Nitrite: NEGATIVE
Protein, ur: 30 mg/dL — AB
SPECIFIC GRAVITY, URINE: 1.024 (ref 1.005–1.030)
Urobilinogen, UA: 0.2 mg/dL (ref 0.0–1.0)
pH: 6.5 (ref 5.0–8.0)

## 2014-04-07 LAB — URINE MICROSCOPIC-ADD ON

## 2014-04-07 LAB — CBG MONITORING, ED
Glucose-Capillary: 357 mg/dL — ABNORMAL HIGH (ref 70–99)
Glucose-Capillary: 417 mg/dL — ABNORMAL HIGH (ref 70–99)

## 2014-04-07 MED ORDER — INSULIN ASPART 100 UNIT/ML ~~LOC~~ SOLN
5.0000 [IU] | Freq: Once | SUBCUTANEOUS | Status: AC
  Administered 2014-04-07: 5 [IU] via SUBCUTANEOUS
  Filled 2014-04-07: qty 1

## 2014-04-07 NOTE — Discharge Instructions (Signed)
Please read and follow all provided instructions.  Your diagnoses today include:  1. Hyperglycemia without ketosis   2. Elevated lipase    Tests performed today include:  Blood counts and electrolytes - no signs of acid build up related to diabetes  Lipase - 130, trending down over past several weeks  Vital signs. See below for your results today.   Medications prescribed:   None  Take any prescribed medications only as directed.  Home care instructions:  Follow any educational materials contained in this packet.  BE VERY CAREFUL not to take multiple medicines containing Tylenol (also called acetaminophen). Doing so can lead to an overdose which can damage your liver and cause liver failure and possibly death.   Follow-up instructions: Please follow-up with your primary care provider and endocrinologist in the next 3 days for further evaluation of your symptoms.   Return instructions:   Please return to the Emergency Department if you experience worsening symptoms.   Return with abdominal pain, fever, vomiting, confusion.  Please return if you have any other emergent concerns.  Additional Information:  Your vital signs today were: BP 142/55 mmHg   Pulse 74   Temp(Src) 98.3 F (36.8 C) (Oral)   Resp 16   SpO2 100% If your blood pressure (BP) was elevated above 135/85 this visit, please have this repeated by your doctor within one month. --------------

## 2014-04-07 NOTE — ED Notes (Signed)
Bedside commode in room.

## 2014-04-07 NOTE — ED Notes (Signed)
Pt attempted to provide sample however was unable to void at this time.

## 2014-04-08 ENCOUNTER — Ambulatory Visit: Payer: Medicare Other | Admitting: Internal Medicine

## 2014-04-09 ENCOUNTER — Ambulatory Visit (INDEPENDENT_AMBULATORY_CARE_PROVIDER_SITE_OTHER): Payer: Medicare Other | Admitting: Internal Medicine

## 2014-04-09 ENCOUNTER — Encounter: Payer: Self-pay | Admitting: Internal Medicine

## 2014-04-09 VITALS — BP 122/64 | HR 84 | Temp 98.5°F | Resp 12 | Wt 195.0 lb

## 2014-04-09 DIAGNOSIS — E1129 Type 2 diabetes mellitus with other diabetic kidney complication: Secondary | ICD-10-CM

## 2014-04-09 DIAGNOSIS — E1165 Type 2 diabetes mellitus with hyperglycemia: Secondary | ICD-10-CM

## 2014-04-09 DIAGNOSIS — IMO0002 Reserved for concepts with insufficient information to code with codable children: Secondary | ICD-10-CM

## 2014-04-09 LAB — HEMOGLOBIN A1C: Hgb A1c MFr Bld: 11.1 % — ABNORMAL HIGH (ref 4.6–6.5)

## 2014-04-09 MED ORDER — INSULIN PEN NEEDLE 32G X 4 MM MISC: Status: DC

## 2014-04-09 MED ORDER — INSULIN GLARGINE 100 UNIT/ML SOLOSTAR PEN: 15.0000 [IU] | PEN_INJECTOR | Freq: Every day | SUBCUTANEOUS | Status: DC

## 2014-04-09 NOTE — Progress Notes (Signed)
Patient ID: Nicole Strong, female   DOB: 12/11/1950, 64 y.o.   MRN: [id number]  HPI: Nicole Strong is a 64 y.o.-year-old female, returning for f/u for DM2, dx in ~2009, non-insulin-dependent, uncontrolled, with complications (CKD). Last visit 2.5 mo ago.  Since last visit, she had pancreatitis >> ED 03/15/2014. On 04/06/2014 >> went to ED with Hgly , CBG >600.  Last hemoglobin A1c was: Lab Results  Component Value Date   HGBA1C 10.0* 12/31/2013   HGBA1C 11.3* 08/14/2013   HGBA1C 9.6* 07/24/2012   Pt is on a regimen of: - Starlix 120 mg 3x a day before meals - Metformin ER 500 mg 2x a day She had Metformin >> diarrhea >> had to stop it She tried Januvia >> had diarrhea from it. She was started on Tradjenta >> could not afford it She was on Wechol 1000 mg po bid >> could not afford it She was on Actos >> sugars better, but had bladder cancer  Pt checks her sugars 1x a day and they are high - 300s during the pancreatitis episode >> now down to 200s - am: 200s. 300 this am (Glucerna last night) - 2h after b'fast: n/c - before lunch: 200s - 2h after lunch: n/c - before dinner: 200s - 2h after dinner: n/c - bedtime: n/c - nighttime: n/c No lows. Lowest sugar was 200; she has hypoglycemia awareness at 200.  Highest sugar was >600 - ED visit.  Glucometer: Freestyle Lite  Pt's meals are: - Breakfast: glucerna, may skip, b/c gastroparesis - Lunch >> mostly brunch: 1/2 sandwich - Dinner: salad or TV dinner or egg + English muffin - Snacks: cheese, PB crackers No sodas  - + CKD, last BUN/creatinine:  Lab Results  Component Value Date   BUN 27* 04/06/2014   CREATININE 1.36* 04/06/2014  She had L nephrectomy 2011. - last set of lipids: Lab Results  Component Value Date   CHOL 315* 12/31/2013   HDL 58.20 12/31/2013   LDLCALC * 05/19/2010    101        Total Cholesterol/HDL:CHD Risk Coronary Heart Disease Risk Table                     Men   Women  1/2 Average Risk   3.4    3.3  Average Risk       5.0   4.4  2 X Average Risk   9.6   7.1  3 X Average Risk  23.4   11.0        Use the calculated Patient Ratio above and the CHD Risk Table to determine the patient's CHD Risk.        ATP III CLASSIFICATION (LDL):  <100     mg/dL   Optimal  100-129  mg/dL   Near or Above                    Optimal  130-159  mg/dL   Borderline  160-189  mg/dL   High  >190     mg/dL   Very High   LDLDIRECT 210.4 12/31/2013   TRIG 388.0* 12/31/2013   CHOLHDL 5 12/31/2013  On Zocor. - last eye exam was in 03/2013. No DR. Has double vision from GBS. - + numbness and tingling in her feet. - she has gastroparesis, no Reglan.  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  ROS: Constitutional: + weight loss, + fatigue, no  subjective hypothermia Eyes: no blurry vision, no xerophthalmia ENT: no sore throat, no nodules palpated in throat, no dysphagia/odynophagia, no hoarseness Cardiovascular: no CP/SOB/palpitations/leg swelling Respiratory: no cough/SOB Gastrointestinal: + N/no V/D/C/acid reflux Musculoskeletal: no muscle/joint aches Skin: no rashes Neurological: no tremors/numbness/tingling/dizziness  I reviewed pt's medications, allergies, PMH, social hx, family hx, and changes were documented in the history of present illness. Otherwise, unchanged from my initial visit note:  Past Medical History  Diagnosis Date  . Diabetes mellitus     2  . Hyperlipidemia   . Hypertension   . Renal mass     BEING WORKED UP BY UROLOGY  . Chronic inflammatory demyelinating polyneuritis   . Guillain-Barre syndrome   . Renal disorder     L kidney removed  . Cancer   . 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 Cesarean section    . Cholecystectomy    . Nephrectomy     History   Social History  . Marital Status: Divorced    Spouse Name: N/A    Number of Children: 1  . Years of Education: 14    Occupational History  .      disabled   Social History Main Topics  . Smoking status: Former Research scientist (life sciences)  . Smokeless tobacco: Never Used  . Alcohol Use: No  . Drug Use: No   Social History Narrative   Patient lives alone.    Disabled.   Education college education.   Right handed.   Caffeine. Rare one cup.   Current Outpatient Prescriptions on File Prior to Visit  Medication Sig Dispense Refill  . amitriptyline (ELAVIL) 10 MG tablet TAKE 4 TABS AT BEDTIME  FOR 1 WEEK THEN INCREASE TO 5 AT BEDTIME IF NECESSARY 150 tablet 3  . aspirin EC 81 MG tablet Take 81 mg by mouth daily.    . calcium citrate (CALCITRATE - DOSED IN MG ELEMENTAL CALCIUM) 950 MG tablet Take 200 mg of elemental calcium by mouth daily.    . cholecalciferol (VITAMIN D) 1000 UNITS tablet Take 5,000 Units by mouth daily.    . COD LIVER OIL PO Take 1 capsule by mouth daily.    Marland Kitchen gabapentin (NEURONTIN) 300 MG capsule TAKE 1 CAPSULE (300 MG TOTAL) BY MOUTH 4 (FOUR) TIMES DAILY. 120 capsule 3  . linagliptin (TRADJENTA) 5 MG TABS tablet Take 1 tablet (5 mg total) by mouth daily. 30 tablet 2  . lisinopril (PRINIVIL,ZESTRIL) 20 MG tablet Take 20 mg by mouth daily.    . metFORMIN (GLUCOPHAGE) 1000 MG tablet TAKE 1 TABLET (1,000 MG TOTAL) BY MOUTH 2 (TWO) TIMES DAILY WITH A MEAL. OFFICE VISIT DUE FOR MORE REFILLS. 60 tablet 0  . metoCLOPramide (REGLAN) 10 MG tablet Take 1 tablet (10 mg total) by mouth 4 (four) times daily -  before meals and at bedtime. 120 tablet 2  . nateglinide (STARLIX) 60 MG tablet Take 1 tablet (60 mg total) by mouth 3 (three) times daily with meals. 90 tablet 2  . promethazine (PHENERGAN) 25 MG tablet TAKE 1 TABLET (25 MG TOTAL) BY MOUTH AS NEEDED as directed by dr dohmeier  FOR NAUSEA. 90 tablet 1  . simvastatin (ZOCOR) 40 MG tablet Take 1 tablet (40 mg total) by mouth at bedtime. 30 tablet 2  . traMADol (ULTRAM) 50 MG tablet Take 50-100 mg by mouth every 6 (six) hours as needed for moderate pain. Take 1  tablet in the morning and 2 tablets at night     No current facility-administered medications on file prior to visit.   Allergies  Allergen Reactions  . Biaxin [Clarithromycin] Other (See Comments)    "face turns red"  . Codeine Nausea And Vomiting and Other (See Comments)    Hallucinations  . Erythromycin Nausea And Vomiting  . Prednisone     Mania.   Family History  Problem Relation Age of Onset  . Cancer Father     PROSTATE  . Cancer Sister     BREAST  . Healthy Mother    PE: BP 122/64 mmHg  Pulse 84  Temp(Src) 98.5 F (36.9 C) (Oral)  Resp 12  Wt 195 lb (88.451 kg)  SpO2 95% Body mass index is 36.86 kg/(m^2). Wt Readings from Last 3 Encounters:  04/09/14 195 lb (88.451 kg)  03/01/14 204 lb (92.534 kg)  02/10/14 200 lb (90.719 kg)   Constitutional: overweight, in NAD, in wheelchair Eyes: PERRLA, EOMI, no exophthalmos ENT: moist mucous membranes, no thyromegaly, no cervical lymphadenopathy Cardiovascular: RRR, No MRG Respiratory: CTA B Gastrointestinal: abdomen soft, NT, ND, BS+ Musculoskeletal: no deformities, strength intact in all 4 Skin: moist, warm, no rashes Neurological: no tremor with outstretched hands, DTR not elicited  ASSESSMENT: 1. DM2, non-insulin-dependent, uncontrolled, with complications - CKD  She has Gastroparesis (self-dx) 2/2 GB.  PLAN:  1. Patient with long-standing, uncontrolled diabetes, on oral antidiabetic regimen, which became insufficient. During her pancreatitis episode, sugars were very high, now a little better, but still very high, throughout the day. She needs insulin. - We discussed about options for treatment, and I suggested to add basal insulin:  Patient Instructions  Please continue: - Starlix 120 mg 3x a day before meals - Metformin ER 500 mg 2x a day Please start: - Lantus 15 units daily at bedtime  Please let me know if the sugars are consistently <80 or >200.  Please return in 1 month with your sugar log.    Please stop at the lab. - demonstrated pen use - given Lantus brochure - discussed correct inj techniques - continue checking sugars at different times of the day - check 2 times a day, rotating checks - advised for yearly eye exams - Check HbA1c today - Return to clinic in 1 mo with sugar log   Office Visit on 04/09/2014  Component Date Value Ref Range Status  . Hgb A1c MFr Bld 04/09/2014 11.1* 4.6 - 6.5 % Final   Glycemic Control Guidelines for People with Diabetes:Non Diabetic:  <6%Goal of Therapy: <7%Additional Action Suggested:  >8%    HbA1c is higher, as expected. Insulin will help!

## 2014-04-09 NOTE — Patient Instructions (Addendum)
Please continue: - Starlix 120 mg 3x a day before meals - Metformin ER 500 mg 2x a day Please start: - Lantus 15 units daily at bedtime  Please let me know if the sugars are consistently <80 or >200.  Please return in 1 month with your sugar log.   Please stop at the lab.

## 2014-04-11 ENCOUNTER — Encounter: Payer: Self-pay | Admitting: Internal Medicine

## 2014-04-12 ENCOUNTER — Ambulatory Visit
Admission: RE | Admit: 2014-04-12 | Discharge: 2014-04-12 | Disposition: A | Discharge status: Home / Self Care | Payer: Medicare Other | Source: Ambulatory Visit | Attending: Gastroenterology | Admitting: Gastroenterology

## 2014-04-12 DIAGNOSIS — R935 Abnormal findings on diagnostic imaging of other abdominal regions, including retroperitoneum: Secondary | ICD-10-CM | POA: Diagnosis not present

## 2014-04-12 DIAGNOSIS — K760 Fatty (change of) liver, not elsewhere classified: Secondary | ICD-10-CM | POA: Diagnosis not present

## 2014-04-12 DIAGNOSIS — D3502 Benign neoplasm of left adrenal gland: Secondary | ICD-10-CM | POA: Diagnosis not present

## 2014-04-12 DIAGNOSIS — R112 Nausea with vomiting, unspecified: Secondary | ICD-10-CM | POA: Diagnosis not present

## 2014-04-12 DIAGNOSIS — D3501 Benign neoplasm of right adrenal gland: Secondary | ICD-10-CM | POA: Diagnosis not present

## 2014-04-12 DIAGNOSIS — K858 Other acute pancreatitis without necrosis or infection: Secondary | ICD-10-CM

## 2014-04-12 MED ORDER — GADOBENATE DIMEGLUMINE 529 MG/ML IV SOLN
8.0000 mL | Freq: Once | INTRAVENOUS | Status: AC | PRN
  Administered 2014-04-12: 8 mL via INTRAVENOUS

## 2014-04-13 ENCOUNTER — Encounter: Payer: Self-pay | Admitting: Neurology

## 2014-04-13 ENCOUNTER — Ambulatory Visit (INDEPENDENT_AMBULATORY_CARE_PROVIDER_SITE_OTHER): Payer: Self-pay | Admitting: Neurology

## 2014-04-13 ENCOUNTER — Ambulatory Visit (INDEPENDENT_AMBULATORY_CARE_PROVIDER_SITE_OTHER): Payer: Medicare Other | Admitting: Neurology

## 2014-04-13 DIAGNOSIS — G6181 Chronic inflammatory demyelinating polyneuritis: Secondary | ICD-10-CM

## 2014-04-13 DIAGNOSIS — G609 Hereditary and idiopathic neuropathy, unspecified: Secondary | ICD-10-CM | POA: Diagnosis not present

## 2014-04-13 NOTE — Procedures (Signed)
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  NERVE CONDUCTION STUDIES:  Nerve conduction studies were performed on the right upper extremity. The distal motor latency for the median nerve was prolonged, with a normal motor amplitude. The distal motor latency and motor amplitude for the right ulnar nerve was normal. The F wave latency for the right median nerve was prolonged, normal for the right ulnar nerve. The nerve conduction velocities for the median and ulnar nerves were normal. The sensory latencies for the right median nerve was prolonged, normal for the right ulnar nerve.  Nerve conduction studies were performed on both lower extremities. The distal motor latencies for the peroneal nerves were normal bilaterally, with low motor amplitudes for these nerves bilaterally. The distal motor latency for the right posterior tibial nerve was prolonged, normal for the left posterior tibial nerve. The motor amplitudes were low for these nerves bilaterally. The nerve conduction velocities for the peroneal and posterior tibial nerves were normal bilaterally. The F wave latencies for the peroneal nerves were absent on the right, prolonged on the left, and absent for the posterior tibial nerves bilaterally. The sural and peroneal sensory latencies were unobtainable bilaterally. The H reflex latencies were unobtainable bilaterally.  EMG STUDIES:  EMG study was performed on the right lower extremity:  The tibialis anterior muscle reveals 2 to 4K motor units with decreased recruitment. No fibrillations or positive waves were seen. The peroneus tertius muscle reveals 2 to 4K motor units with decreased recruitment. No fibrillations or positive waves were seen. The medial gastrocnemius muscle reveals 1 to 3K motor units with decreased  recruitment. No fibrillations or positive waves were seen. The vastus lateralis muscle reveals 2 to 4K motor units with full recruitment. No fibrillations or positive waves were seen. The iliopsoas muscle reveals 2 to 4K motor units with full recruitment. No fibrillations or positive waves were seen. The biceps femoris muscle (long head) reveals 2 to 4K motor units with full recruitment. No fibrillations or positive waves were seen. The lumbosacral paraspinal muscles were tested at 3 levels, and revealed no abnormalities of insertional activity at all 3 levels tested. There was good relaxation.  EMG study was performed on the left lower extremity:  The tibialis anterior muscle reveals 2 to 4K motor units with decreased recruitment. No fibrillations or positive waves were seen. The peroneus tertius muscle reveals 2 to 4K motor units with decreased recruitment. No fibrillations or positive waves were seen. The medial gastrocnemius muscle reveals 1 to 3K motor units with decreased recruitment. No fibrillations or positive waves were seen. The vastus lateralis muscle reveals 2 to 4K motor units with full recruitment. No fibrillations or positive waves were seen. The iliopsoas muscle reveals 2 to 4K motor units with full recruitment. No fibrillations or positive waves were seen. The biceps femoris muscle (long head) reveals 2 to 4K motor units with full recruitment. No fibrillations or positive waves were seen. The lumbosacral paraspinal muscles were tested at 3 levels, and revealed no abnormalities of insertional activity at all 3 levels tested. There was good relaxation.   IMPRESSION:  Nerve conduction studies done on the right upper extremity and on both lower extremities shows evidence of a primarily axonal peripheral neuropathy of moderate severity. There is evidence of an overlying mild right carpal tunnel syndrome. No evidence of a demyelinating  neuropathy is seen. EMG evaluation of both lower  extremities were relatively unremarkable, with only a mild decrease in recruitment of the muscles below the knees bilaterally. No evidence of acute or chronic denervation is seen. There is no evidence of an overlying lumbosacral radiculopathy on either side. EMG evaluation with suggest that the peripheral neuropathy is more mild than the nerve conduction study suggests. Peripheral edema may account for some of the lowering of motor amplitudes seen.  Jill Alexanders MD 04/13/2014 6:10 PM  [facility] [address] [place], [place] [address]  Phone [phone] Fax [phone]

## 2014-04-13 NOTE — Progress Notes (Signed)
Please refer to EMG and NCV procedure note. 

## 2014-04-19 ENCOUNTER — Other Ambulatory Visit: Payer: Self-pay | Admitting: Family Medicine

## 2014-04-19 NOTE — Telephone Encounter (Signed)
Lab appointment scheduled.  Med filled.

## 2014-04-19 NOTE — Telephone Encounter (Signed)
Last filled:  03/19/14 Amt: 30, 0 Repeat labs are due  Pt needs a morning lab appointment.    Left detailed message for call back.

## 2014-04-20 ENCOUNTER — Encounter: Payer: Self-pay | Admitting: Family Medicine

## 2014-04-29 ENCOUNTER — Other Ambulatory Visit: Payer: Medicare Other

## 2014-04-29 LAB — MICROALBUMIN / CREATININE URINE RATIO
CREATININE, U: 142.3 mg/dL
MICROALB UR: 277.6 mg/dL — AB (ref 0.0–1.9)
Microalb Creat Ratio: 195.1 mg/g — ABNORMAL HIGH (ref 0.0–30.0)

## 2014-04-29 LAB — CBC WITH DIFFERENTIAL/PLATELET
BASOS PCT: 0.4 % (ref 0.0–3.0)
Basophils Absolute: 0 10*3/uL (ref 0.0–0.1)
Eosinophils Absolute: 0.1 10*3/uL (ref 0.0–0.7)
Eosinophils Relative: 1.6 % (ref 0.0–5.0)
HEMATOCRIT: 33.7 % — AB (ref 36.0–46.0)
HEMOGLOBIN: 11.7 g/dL — AB (ref 12.0–15.0)
Lymphocytes Relative: 27.7 % (ref 12.0–46.0)
Lymphs Abs: 1.8 10*3/uL (ref 0.7–4.0)
MCHC: 34.7 g/dL (ref 30.0–36.0)
MCV: 88.5 fl (ref 78.0–100.0)
MONO ABS: 0.3 10*3/uL (ref 0.1–1.0)
Monocytes Relative: 5.2 % (ref 3.0–12.0)
Neutro Abs: 4.2 10*3/uL (ref 1.4–7.7)
Neutrophils Relative %: 65.1 % (ref 43.0–77.0)
Platelets: 233 10*3/uL (ref 150.0–400.0)
RBC: 3.81 Mil/uL — ABNORMAL LOW (ref 3.87–5.11)
RDW: 14.4 % (ref 11.5–15.5)
WBC: 6.4 10*3/uL (ref 4.0–10.5)

## 2014-04-29 LAB — LIPID PANEL
CHOLESTEROL: 127 mg/dL (ref 0–200)
HDL: 53.1 mg/dL (ref >39.00)
LDL Cholesterol: 46 mg/dL (ref 0–99)
NonHDL: 73.9
TRIGLYCERIDES: 141 mg/dL (ref 0.0–149.0)
Total CHOL/HDL Ratio: 2
VLDL: 28.2 mg/dL (ref 0.0–40.0)

## 2014-04-29 LAB — HEPATIC FUNCTION PANEL
ALBUMIN: 3.9 g/dL (ref 3.5–5.2)
ALK PHOS: 78 U/L (ref 39–117)
ALT: 15 U/L (ref 0–35)
AST: 13 U/L (ref 0–37)
Bilirubin, Direct: 0.1 mg/dL (ref 0.0–0.3)
Total Bilirubin: 0.5 mg/dL (ref 0.2–1.2)
Total Protein: 6.4 g/dL (ref 6.0–8.3)

## 2014-04-29 LAB — BASIC METABOLIC PANEL
BUN: 22 mg/dL (ref 6–23)
CO2: 31 mEq/L (ref 19–32)
CREATININE: 1.14 mg/dL (ref 0.40–1.20)
Calcium: 9.2 mg/dL (ref 8.4–10.5)
Chloride: 101 mEq/L (ref 96–112)
GFR: 50.71 mL/min — ABNORMAL LOW (ref >60.00)
Glucose, Bld: 201 mg/dL — ABNORMAL HIGH (ref 70–99)
POTASSIUM: 4.4 meq/L (ref 3.5–5.1)
SODIUM: 136 meq/L (ref 135–145)

## 2014-05-01 ENCOUNTER — Encounter: Payer: Self-pay | Admitting: Family Medicine

## 2014-05-03 ENCOUNTER — Telehealth: Payer: Self-pay | Admitting: *Deleted

## 2014-05-03 NOTE — Telephone Encounter (Signed)
I called pt and LMVM for her relating to EMG/ NCS results.  No IVIG needed due to results of testing as this would not help.  She is to call back if questions. I relayed to [place] about cancelling IVIG subq.

## 2014-05-03 NOTE — Telephone Encounter (Signed)
Please relate a copy of the EMG to Nicole Strong- axonal neuropathy is not responding to IVIG, this form of GBS is only expected to partially recover and is not treatable with any known means. The cause is a different  Virus or bacterium. We see this currently in the [place]/ [place]  more often with ZIKA, and in [place] countries with campylobacter. The recovery is incomplete . I am sorry to give her these news. CD

## 2014-05-03 NOTE — Telephone Encounter (Signed)
I sent email to Axela, [place], relating to EMG/[place] results did not show myelinating damage that the IVIG would assist with.  Pt has axonal damage (at the nerve root).  Would not benefit with IVIG.

## 2014-05-03 NOTE — Telephone Encounter (Signed)
LMVM for pt that calling with EMG/NCS results, and IVIG recommendations.

## 2014-05-04 ENCOUNTER — Other Ambulatory Visit: Payer: Self-pay | Admitting: Neurology

## 2014-05-05 ENCOUNTER — Encounter: Payer: Self-pay | Admitting: Neurology

## 2014-05-10 ENCOUNTER — Other Ambulatory Visit: Payer: Self-pay

## 2014-05-10 DIAGNOSIS — G6181 Chronic inflammatory demyelinating polyneuritis: Secondary | ICD-10-CM

## 2014-05-10 MED ORDER — TRAMADOL HCL 50 MG PO TABS: ORAL_TABLET | ORAL | Status: DC

## 2014-05-11 NOTE — Telephone Encounter (Signed)
Rx signed and faxed.

## 2014-05-17 ENCOUNTER — Other Ambulatory Visit: Payer: Self-pay | Admitting: Family Medicine

## 2014-05-19 DIAGNOSIS — R933 Abnormal findings on diagnostic imaging of other parts of digestive tract: Secondary | ICD-10-CM | POA: Diagnosis not present

## 2014-05-19 DIAGNOSIS — R1011 Right upper quadrant pain: Secondary | ICD-10-CM | POA: Diagnosis not present

## 2014-05-19 DIAGNOSIS — K859 Acute pancreatitis, unspecified: Secondary | ICD-10-CM | POA: Diagnosis not present

## 2014-05-19 DIAGNOSIS — R6881 Early satiety: Secondary | ICD-10-CM | POA: Diagnosis not present

## 2014-05-19 DIAGNOSIS — G6181 Chronic inflammatory demyelinating polyneuritis: Secondary | ICD-10-CM | POA: Diagnosis not present

## 2014-05-20 ENCOUNTER — Other Ambulatory Visit: Payer: Self-pay | Admitting: Internal Medicine

## 2014-05-20 ENCOUNTER — Encounter: Payer: Self-pay | Admitting: Family Medicine

## 2014-05-20 NOTE — Telephone Encounter (Signed)
D/c zocor and see if pain improves---- if yes---- in about 2 weeks start Lipitor 10 mg 3x a week and see how you do with that----let us know if it causes pain as well.

## 2014-05-25 ENCOUNTER — Ambulatory Visit (INDEPENDENT_AMBULATORY_CARE_PROVIDER_SITE_OTHER): Payer: Medicare Other | Admitting: Internal Medicine

## 2014-05-25 ENCOUNTER — Other Ambulatory Visit: Payer: Self-pay | Admitting: Family Medicine

## 2014-05-25 ENCOUNTER — Encounter: Payer: Self-pay | Admitting: Internal Medicine

## 2014-05-25 ENCOUNTER — Other Ambulatory Visit: Payer: Self-pay | Admitting: *Deleted

## 2014-05-25 VITALS — BP 122/64 | HR 72 | Temp 98.3°F | Resp 12 | Wt 203.0 lb

## 2014-05-25 DIAGNOSIS — E1165 Type 2 diabetes mellitus with hyperglycemia: Secondary | ICD-10-CM

## 2014-05-25 DIAGNOSIS — E1129 Type 2 diabetes mellitus with other diabetic kidney complication: Secondary | ICD-10-CM | POA: Diagnosis not present

## 2014-05-25 DIAGNOSIS — IMO0002 Reserved for concepts with insufficient information to code with codable children: Secondary | ICD-10-CM

## 2014-05-25 MED ORDER — INSULIN ASPART 100 UNIT/ML FLEXPEN: 6.0000 [IU] | PEN_INJECTOR | Freq: Three times a day (TID) | SUBCUTANEOUS | Status: DC

## 2014-05-25 MED ORDER — INSULIN LISPRO 100 UNIT/ML (KWIKPEN)
6.0000 [IU] | PEN_INJECTOR | Freq: Three times a day (TID) | SUBCUTANEOUS | Status: DC
Start: 2014-05-25 — End: 2014-05-26

## 2014-05-25 NOTE — Patient Instructions (Addendum)
Please continue: - Starlix 120 mg 3x a day with meals - Metformin ER 500 mg 2x a day - Lantus 15 units at bedtime  Please start NovoLog - taking with dinner: - 6 units before a regular meal - 8 units before a larger meal or if you have dessert  Please return in 1.5 month with your sugar log.

## 2014-05-25 NOTE — Telephone Encounter (Signed)
Ins would not cover Novolog. Switching to Humalog.

## 2014-05-25 NOTE — Progress Notes (Signed)
Patient ID: Nicole Strong, female   DOB: 08-Nov-1950, 64 y.o.   MRN: [id number]  HPI: Nicole Strong is a 64 y.o.-year-old female, returning for f/u for DM2, dx in ~2009, insulin-dependent, uncontrolled, with complications (CKD). Last visit 1.5 mo ago.  Last hemoglobin A1c was: Lab Results  Component Value Date   HGBA1C 11.1* 04/09/2014   HGBA1C 10.0* 12/31/2013   HGBA1C 11.3* 08/14/2013   Pt is on a regimen of: - Starlix 120 mg 3x a day with meals meals - Metformin ER 500 mg 2x a day - Lantus 15 units - started 03/2014 (had nausea when she started the inj >> now resolved) She had Metformin >> diarrhea >> had to stop it She tried Januvia >> had diarrhea from it. She was started on Tradjenta >> could not afford it, also blocked the abs of her other meds. She was on Wechol 1000 mg po bid >> could not afford it She was on Actos >> sugars better, but had bladder cancer  Pt checks her sugars 1x a day and they are better especially in am: - am: 200s. 300 this am (Glucerna last night) >> 130-180, 205 - 2h after b'fast: n/c >> 181-205 - before lunch: 200s >> n/c - 2h after lunch: n/c - before dinner: 200s >> 121-199 - 2h after dinner: n/c >> 158-205 - bedtime: n/c >> 192-312 - nighttime: n/c >> 121-208 No lows. Lowest sugar was 200 >> 130; she has hypoglycemia awareness at 200.  Highest sugar was >600 - ED visit >> 300s.  Glucometer: Freestyle Lite  Pt's meals are: - Breakfast: glucerna, may skip, b/c gastroparesis - Lunch >> mostly brunch: 1/2 sandwich - Dinner: salad or TV dinner or egg + English muffin - Snacks: cheese, PB crackers No sodas  - + CKD, last BUN/creatinine:  Lab Results  Component Value Date   BUN 22 04/29/2014   CREATININE 1.14 04/29/2014  She had L nephrectomy 2011. - last set of lipids: Lab Results  Component Value Date   CHOL 127 04/29/2014   HDL 53.10 04/29/2014   LDLCALC 46 04/29/2014   LDLDIRECT 210.4 12/31/2013   TRIG 141.0 04/29/2014   CHOLHDL  2 04/29/2014  On Zocor >> mm pain >> stopped >> did not start Lipitor as suggested. - last eye exam was in 03/2013. No DR. Has double vision from GBS. - + numbness and tingling in her feet. - she has gastroparesis, no Reglan.  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  She had pancreatitis >> ED 03/15/2014. On 04/06/2014 >> went to ED with Hgly , CBG >600.  ROS: Constitutional: + weight gain, no fatigue, no subjective hypothermia Eyes: no blurry vision, no xerophthalmia ENT: no sore throat, no nodules palpated in throat, no dysphagia/odynophagia, no hoarseness Cardiovascular: no CP/SOB/palpitations/leg swelling Respiratory: no cough/SOB Gastrointestinal: no N/V/D/C/acid reflux Musculoskeletal: no muscle/joint aches Skin: no rashes Neurological: no tremors/numbness/tingling/dizziness  I reviewed pt's medications, allergies, PMH, social hx, family hx, and changes were documented in the history of present illness. Otherwise, unchanged from my initial visit note:  Past Medical History  Diagnosis Date  . Diabetes mellitus     2  . Hyperlipidemia   . Hypertension   . Renal mass     BEING WORKED UP BY UROLOGY  . Chronic inflammatory demyelinating polyneuritis   . Guillain-Barre syndrome   . Renal disorder     L kidney removed  . Cancer   . 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 Cesarean section    . Cholecystectomy    . Nephrectomy     History   Social History  . Marital Status: Divorced    Spouse Name: N/A    Number of Children: 1  . Years of Education: 14   Occupational History  .      disabled   Social History Main Topics  . Smoking status: Former Research scientist (life sciences)  . Smokeless tobacco: Never Used  . Alcohol Use: No  . Drug Use: No   Social History Narrative   Patient lives alone.    Disabled.   Education college education.   Right handed.   Caffeine. Rare one cup.   Current  Outpatient Prescriptions on File Prior to Visit  Medication Sig Dispense Refill  . amitriptyline (ELAVIL) 10 MG tablet TAKE 4 TABS AT BEDTIME  FOR 1 WEEK THEN INCREASE TO 5 AT BEDTIME IF NECESSARY 150 tablet 3  . aspirin EC 81 MG tablet Take 81 mg by mouth daily.    . calcium citrate (CALCITRATE - DOSED IN MG ELEMENTAL CALCIUM) 950 MG tablet Take 200 mg of elemental calcium by mouth daily.    . cholecalciferol (VITAMIN D) 1000 UNITS tablet Take 5,000 Units by mouth daily.    . COD LIVER OIL PO Take 1 capsule by mouth daily.    [place] gabapentin (NEURONTIN) 300 MG capsule TAKE 1 CAPSULE (300 MG TOTAL) BY MOUTH 4 (FOUR) TIMES DAILY. 120 capsule 3  . linagliptin (TRADJENTA) 5 MG TABS tablet Take 1 tablet (5 mg total) by mouth daily. 30 tablet 2  . lisinopril (PRINIVIL,ZESTRIL) 20 MG tablet Take 20 mg by mouth daily.    . metFORMIN (GLUCOPHAGE) 1000 MG tablet TAKE 1 TABLET (1,000 MG TOTAL) BY MOUTH 2 (TWO) TIMES DAILY WITH A MEAL. OFFICE VISIT DUE FOR MORE REFILLS. 60 tablet 0  . metoCLOPramide (REGLAN) 10 MG tablet Take 1 tablet (10 mg total) by mouth 4 (four) times daily -  before meals and at bedtime. 120 tablet 2  . nateglinide (STARLIX) 60 MG tablet Take 1 tablet (60 mg total) by mouth 3 (three) times daily with meals. 90 tablet 2  . promethazine (PHENERGAN) 25 MG tablet TAKE 1 TABLET (25 MG TOTAL) BY MOUTH AS NEEDED as directed by dr dohmeier  FOR NAUSEA. 90 tablet 1  . simvastatin (ZOCOR) 40 MG tablet Take 1 tablet (40 mg total) by mouth at bedtime. 30 tablet 2  . traMADol (ULTRAM) 50 MG tablet Take 50-100 mg by mouth every 6 (six) hours as needed for moderate pain. Take 1 tablet in the morning and 2 tablets at night     No current facility-administered medications on file prior to visit.   Allergies  Allergen Reactions  . Biaxin [Clarithromycin] Other (See Comments)    "face turns red"  . Codeine Nausea And Vomiting and Other (See Comments)    Hallucinations  . Erythromycin Nausea And  Vomiting  . Prednisone     Mania.   Family History  Problem Relation Age of Onset  . Cancer Father     PROSTATE  . Cancer Sister     BREAST  . Healthy Mother    PE: BP 122/64 mmHg  Pulse 72  Temp(Src) 98.3 F (36.8 C) (Oral)  Resp 12  Wt 203 lb (92.08 kg)  SpO2 98% Body mass index is 38.38 kg/(m^2). Wt Readings from Last 3 Encounters:  05/25/14 203 lb (92.08 kg)  04/09/14 195 lb (88.451 kg)  03/01/14 204 lb (92.534 kg)   Constitutional: overweight, in NAD, in wheelchair Eyes: PERRLA, EOMI, no exophthalmos ENT: moist mucous membranes, no thyromegaly, no cervical lymphadenopathy Cardiovascular: RRR, No MRG Respiratory: CTA B Gastrointestinal: abdomen soft, NT, ND, BS+ Musculoskeletal: no deformities, strength intact in all 4 Skin: moist, warm, no rashes Neurological: no tremor with outstretched hands, DTR not elicited  ASSESSMENT: 1. DM2, insulin-dependent, uncontrolled, with complications - CKD  She has Gastroparesis (self-dx) 2/2 GB.  PLAN:  1. Patient with long-standing, uncontrolled diabetes, on oral antidiabetic regimen, which improved after starting Lantus but still high sugars after dinner >> will start NovoLog at dinner start (2/2 gastroparesis). She agrees with this. - We discussed about options for treatment:  Patient Instructions  Please continue: - Starlix 120 mg 3x a day with meals - Metformin ER 500 mg 2x a day - Lantus 15 units at bedtime  Please start NovoLog - with dinner: - 6 units before a regular meal - 8 units before a larger meal or if you have dessert  Please return in 1.5 month with your sugar log.   - continue checking sugars at different times of the day - check 2 times a day, rotating checks - advised for yearly eye exams - Check HbA1c at next visit - Return to clinic in 1.5 mo with sugar log

## 2014-05-26 ENCOUNTER — Other Ambulatory Visit: Payer: Self-pay | Admitting: *Deleted

## 2014-05-26 ENCOUNTER — Encounter: Payer: Self-pay | Admitting: Internal Medicine

## 2014-05-26 MED ORDER — INSULIN LISPRO 100 UNIT/ML (KWIKPEN)
6.0000 [IU] | PEN_INJECTOR | Freq: Every day | SUBCUTANEOUS | Status: DC
Start: 2014-05-26 — End: 2014-05-26

## 2014-05-26 MED ORDER — INSULIN LISPRO 100 UNIT/ML (KWIKPEN)
6.0000 [IU] | PEN_INJECTOR | Freq: Every day | SUBCUTANEOUS | Status: DC
Start: 2014-05-26 — End: 2014-07-08

## 2014-05-26 NOTE — Telephone Encounter (Signed)
Resending rx for Humalog. Wrong directions.

## 2014-05-26 NOTE — Telephone Encounter (Signed)
Directions wrong.

## 2014-05-31 ENCOUNTER — Other Ambulatory Visit: Payer: Self-pay | Admitting: Neurology

## 2014-06-02 ENCOUNTER — Encounter: Payer: Self-pay | Admitting: Family Medicine

## 2014-06-02 ENCOUNTER — Other Ambulatory Visit: Payer: Self-pay | Admitting: Family Medicine

## 2014-06-03 ENCOUNTER — Other Ambulatory Visit: Payer: Self-pay | Admitting: Gastroenterology

## 2014-06-03 DIAGNOSIS — K449 Diaphragmatic hernia without obstruction or gangrene: Secondary | ICD-10-CM | POA: Diagnosis not present

## 2014-06-03 DIAGNOSIS — R1011 Right upper quadrant pain: Secondary | ICD-10-CM | POA: Diagnosis not present

## 2014-06-03 DIAGNOSIS — D126 Benign neoplasm of colon, unspecified: Secondary | ICD-10-CM | POA: Diagnosis not present

## 2014-06-03 DIAGNOSIS — K640 First degree hemorrhoids: Secondary | ICD-10-CM | POA: Diagnosis not present

## 2014-06-03 DIAGNOSIS — R933 Abnormal findings on diagnostic imaging of other parts of digestive tract: Secondary | ICD-10-CM | POA: Diagnosis not present

## 2014-06-03 DIAGNOSIS — D123 Benign neoplasm of transverse colon: Secondary | ICD-10-CM | POA: Diagnosis not present

## 2014-06-03 DIAGNOSIS — R1013 Epigastric pain: Secondary | ICD-10-CM | POA: Diagnosis not present

## 2014-06-03 MED ORDER — GLUCOSE BLOOD VI STRP: ORAL_STRIP | Status: DC

## 2014-06-03 MED ORDER — ATORVASTATIN CALCIUM 10 MG PO TABS: ORAL_TABLET | ORAL | Status: DC

## 2014-06-03 NOTE — Telephone Encounter (Signed)
D/c zocor and see if pain improves---- if yes---- in about 2 weeks start Lipitor 10 mg 3x a week and see how you do with that----let us know if it causes pain as well.

## 2014-06-07 DIAGNOSIS — R109 Unspecified abdominal pain: Secondary | ICD-10-CM | POA: Diagnosis not present

## 2014-06-07 DIAGNOSIS — R197 Diarrhea, unspecified: Secondary | ICD-10-CM | POA: Diagnosis not present

## 2014-06-23 ENCOUNTER — Ambulatory Visit (INDEPENDENT_AMBULATORY_CARE_PROVIDER_SITE_OTHER): Payer: Medicare Other | Admitting: Neurology

## 2014-06-23 ENCOUNTER — Encounter: Payer: Self-pay | Admitting: Neurology

## 2014-06-23 VITALS — BP 174/73 | HR 70 | Resp 16

## 2014-06-23 DIAGNOSIS — G6189 Other inflammatory polyneuropathies: Secondary | ICD-10-CM | POA: Diagnosis not present

## 2014-06-23 DIAGNOSIS — E139 Other specified diabetes mellitus without complications: Secondary | ICD-10-CM

## 2014-06-23 MED ORDER — AMITRIPTYLINE HCL 10 MG PO TABS: 40.0000 mg | ORAL_TABLET | Freq: Every day | ORAL | Status: DC

## 2014-06-23 MED ORDER — GABAPENTIN 300 MG PO CAPS: ORAL_CAPSULE | ORAL | Status: DC

## 2014-06-23 NOTE — Progress Notes (Signed)
Provider:  [name], M D  Referring Provider: Rosalita Chessman, DO Primary Care Physician:  Garnet Koyanagi, DO  Chief Complaint  Patient presents with  . Follow-up    CIDP, rm 10, alone    HPI:  Nicole Strong is a 64 y.o. female , seen here as a  revisit from Dr. 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  Mrs. Wohler underwent an EMG with nerve conduction study which I basically order to qualify her for IV Ig therapy. There were other obstacles to IVIG as she has only one kidney. However the nerve conduction study and EMG performed by Dr. Jannifer Franklin documented axonal peripheral nerve injury and not demyelinating. 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 She seems to have had the axonal form of Guillain-Barr which has a very slow recovery rate at often incomplete recovery. It is also more prone to have pain involvement. IVIG was for this reason not longer considered a feasible treatment option. At this time there is no specific treatment for axonal neuropathies except moderate diet moderate exercise lifestyle changes that will promote healthy nerves. 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 Follow for diabetes hopefully also will improve her overall neurologic health. She continues to gain weight on insulin which is common. She has struggled to visit the Guillain-Barr syndrome and aftermath now for over 4 years and had excessive weight gain due to inactivity.  Review of Systems: Out of a complete 14 system review, the patient complains of only the following symptoms, and all other reviewed systems are negative. Pain, weight gain, diabetes,   History   Social History  . Marital Status: Divorced    Spouse Name: N/A  . Number of Children: 1  . Years of Education: 14   Occupational History  .     disabled   Social History Main Topics  . Smoking status: Former Research scientist (life sciences)  . Smokeless tobacco: Never Used  . Alcohol Use: No  . Drug Use: No  . Sexual Activity: Not on file   Other Topics Concern  . Not on file   Social History Narrative   Patient lives at home alone.    Disabled.   Education college education.   Right handed.   Caffeine. Rare one cup.    Family History  Problem Relation Age of Onset  . Cancer Father     PROSTATE  . Cancer Sister     BREAST  . Healthy Mother     Past Medical History  Diagnosis Date  . Diabetes mellitus     2  . Hyperlipidemia   . Hypertension   . Renal mass     BEING WORKED UP BY UROLOGY  . Chronic inflammatory demyelinating polyneuritis   . Guillain-Barre syndrome   . Renal disorder     L kidney removed  . Cancer   . 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 Cesarean section    . Cholecystectomy    . Nephrectomy      Current Outpatient Prescriptions  Medication Sig Dispense Refill  . ALPRAZolam (XANAX) 0.25 MG tablet Take 0.25 mg by mouth 3 (three) times daily as needed for anxiety.     Marland Kitchen amitriptyline (ELAVIL) 10 MG tablet Take 40 mg by  mouth at bedtime.    Marland Kitchen amitriptyline (ELAVIL) 10 MG tablet TAKE 4 TABS AT BEDTIME  FOR 1 WEEK THEN INCREASE TO 5 AT BEDTIME IF NECESSARY 150 tablet 3  . aspirin EC 81 MG tablet Take 81 mg by mouth daily.    Marland Kitchen atorvastatin (LIPITOR) 10 MG tablet Take 1 tablet by mouth 3 times per week 12 tablet 2  . calcium citrate (CALCITRATE - DOSED IN MG ELEMENTAL CALCIUM) 950 MG tablet Take 200 mg of elemental calcium by mouth daily.    . cholecalciferol (VITAMIN D) 1000 UNITS tablet Take 5,000 Units by mouth daily.    . COD LIVER OIL PO Take 1 capsule by mouth daily.    Marland Kitchen docusate sodium (COLACE) 100 MG capsule Take 1 capsule (100 mg total) by mouth every 12 (twelve) hours. 60 capsule 0  . gabapentin (NEURONTIN) 300 MG capsule TAKE 1 CAPSULE  (300 MG TOTAL) BY MOUTH 4 (FOUR) TIMES DAILY. 120 capsule 3  . glucose blood (FREESTYLE LITE) test strip Use to test blood sugar 2 times daily as instructed. Dx code: E11.29 100 each 11  . Insulin Glargine (LANTUS SOLOSTAR) 100 UNIT/ML Solostar Pen Inject 15 Units into the skin daily at 10 pm. 5 pen 2  . insulin lispro (HUMALOG KWIKPEN) 100 UNIT/ML KiwkPen Inject 0.06-0.08 mLs (6-8 Units total) into the skin daily with supper. 15 mL 2  . Insulin Pen Needle 32G X 4 MM MISC Use 1x a day 100 each 2  . lisinopril (PRINIVIL,ZESTRIL) 20 MG tablet Take 20 mg by mouth daily.    . metFORMIN (GLUCOPHAGE-XR) 500 MG 24 hr tablet TAKE 1 TABLET (500 MG TOTAL) BY MOUTH 2 (TWO) TIMES DAILY. 60 tablet 2  . nateglinide (STARLIX) 120 MG tablet Take 1 tablet (120 mg total) by mouth 3 (three) times daily with meals. 90 tablet 2  . oxyCODONE-acetaminophen (PERCOCET/ROXICET) 5-325 MG per tablet Take 1 tablet by mouth every 4 (four) hours as needed. (Patient taking differently: Take 1 tablet by mouth every 4 (four) hours as needed for moderate pain. ) 15 tablet 0  . promethazine (PHENERGAN) 25 MG tablet TAKE 1 TABLET (25 MG TOTAL) BY MOUTH AS NEEDED AS DIRECTED BY DR Brett Fairy  FOR NAUSEA. 90 tablet 3  . Suvorexant (BELSOMRA) 10 MG TABS Take 1 tablet by mouth at bedtime. 6 tablet 0  . Suvorexant (BELSOMRA) 15 MG TABS Take 15 mg by mouth Nightly. 10 tablet 0  . traMADol (ULTRAM) 50 MG tablet Take 1 tablet in the morning and 2 tablets at night 90 tablet 3  . Immune Globulin, Human, 1 GM/5ML SOLN Subcutaneus use per weight , patient needs chem 7 every week. [facility] . (Patient not taking: Reported on 06/23/2014) 10 mL 5  . Immune Globulin, Human, 10 GM/50ML SOLN Initial loading dose 2 g/kg of high central 179 g 4 times weekly dispensed as 272 g with 0 refills. (Patient not taking: Reported on 06/23/2014) 3 vial 0  . metoCLOPramide (REGLAN) 10 MG tablet Take 1 tablet (10 mg total) by mouth 4 (four) times daily -  before  meals and at bedtime. (Patient not taking: Reported on 06/23/2014) 120 tablet 2  . ondansetron (ZOFRAN ODT) 4 MG disintegrating tablet Take 1 tablet (4 mg total) by mouth every 8 (eight) hours as needed for nausea or vomiting. (Patient not taking: Reported on 06/23/2014) 20 tablet 0   No current facility-administered medications for this visit.    Allergies as of 06/23/2014 - Review Complete  06/23/2014  Allergen Reaction Noted  . Biaxin [clarithromycin] Other (See Comments) 05/31/2012  . Codeine Nausea And Vomiting and Other (See Comments) 10/19/2009  . Erythromycin Nausea And Vomiting 06/17/2012  . Januvia [sitagliptin]  06/22/2014  . Prednisone  04/06/2014    Vitals: BP 174/73 mmHg  Pulse 70  Resp 16 Last Weight:  Wt Readings from Last 1 Encounters:  05/25/14 203 lb (92.08 kg)   Last Height:   Ht Readings from Last 1 Encounters:  03/01/14 5\' 1"  (1.549 m)    Physical exam:  General: The patient is awake, alert and appears not in acute distress. The patient is well groomed. Head: Normocephalic, atraumatic. Neck is supple. Mallampati 3 , neck circumference: 14  Cardiovascular:  Regular rate and rhythm , without  murmurs or carotid bruit, and without distended neck veins. Respiratory: Lungs are clear to auscultation. Skin:  Without evidence of edema, or rash Trunk: BMI is severly  elevated .  Neurologic exam : The patient is awake and alert, oriented to place and time.  Memory subjective described as intact.  There is a normal attention span & concentration ability. Speech is fluent without dysarthria, dysphonia or aphasia. Mood and affect are appropriate.  Cranial nerves: Pupils are equal and briskly reactive to light. Funduscopic exam without evidence of pallor or edema.  Extraocular movements  in vertical and horizontal planes intact and without nystagmus.  Visual fields by finger perimetry are intact. Hearing to finger rub intact.  Facial sensation intact to fine touch.  Facial motor strength is symmetric and tongue and uvula move midline. Tongue protrusion into either cheek is normal. Shoulder shrug is weak-  Hip flexion is very weak.    Motor exam:  Decreased muscle tone in all extremities, loss of mass. Her grip strength is weak ,  she cannot maintain a strong grip. She continues to have spinal and lower back pain and most of her strength loss is evident from the navel down involving both lower extremities. This is commonly a pattern of Guillain-Barr. I was able to elicit deep tendon reflexes in her upper extremities. These are not preserved in her lower extremities. Sensory:  Fine touch, pinprick and vibration were tested in all extremities- sensory loss covered by hyperesthesia.   Proprioception ab-normal.   Coordination: Rapid alternating movements in the fingers/hands were normal.  Finger-to-nose maneuver  normal witho evidence of  tremor.  Gait and station: Patient in wheelchair, able to stand only with assistance.  She can walk with a walker on an even surface. She cannot manage an incline. Strength abnormal,  limits. Stance is instable  Tandem gait deferred.   Deep tendon reflexes: in the  upper and lower extremities are symmetric and intact.  Babinski maneuver response is downgoing.   Assessment:  After physical and neurologic examination, review of laboratory studies, imaging, neurophysiology testing and pre-existing records, assessment is that of :   Axonal neuropathy with onset in form from [facility] in December 2011 -2012. Dr. Doy Mince.  Spinal tap had been performed in the hospital but EMG and nerve conduction studies were not performed until earlier this year.   Plan:  Treatment plan and additional workup : Axonal polyneuropathy with ascending pattern, subtype of Guillain-Barr, not qualifying as CIDP and therefore not treatable with IVIG. Main management is physical therapy diet adjustment, and pain therapy as  needed.       Asencion Partridge Kiernan Farkas MD 06/23/2014

## 2014-07-05 ENCOUNTER — Emergency Department (HOSPITAL_COMMUNITY): Payer: Medicare Other

## 2014-07-05 ENCOUNTER — Emergency Department (HOSPITAL_COMMUNITY)
Admission: EM | Admit: 2014-07-05 | Discharge: 2014-07-05 | Disposition: A | Discharge status: Home / Self Care | Payer: Medicare Other | Attending: Emergency Medicine | Admitting: Emergency Medicine

## 2014-07-05 ENCOUNTER — Encounter (HOSPITAL_COMMUNITY): Payer: Self-pay | Admitting: *Deleted

## 2014-07-05 DIAGNOSIS — Z8669 Personal history of other diseases of the nervous system and sense organs: Secondary | ICD-10-CM | POA: Insufficient documentation

## 2014-07-05 DIAGNOSIS — Z859 Personal history of malignant neoplasm, unspecified: Secondary | ICD-10-CM | POA: Diagnosis not present

## 2014-07-05 DIAGNOSIS — E785 Hyperlipidemia, unspecified: Secondary | ICD-10-CM | POA: Insufficient documentation

## 2014-07-05 DIAGNOSIS — Z794 Long term (current) use of insulin: Secondary | ICD-10-CM | POA: Insufficient documentation

## 2014-07-05 DIAGNOSIS — Z79899 Other long term (current) drug therapy: Secondary | ICD-10-CM | POA: Insufficient documentation

## 2014-07-05 DIAGNOSIS — E119 Type 2 diabetes mellitus without complications: Secondary | ICD-10-CM | POA: Insufficient documentation

## 2014-07-05 DIAGNOSIS — X58XXXA Exposure to other specified factors, initial encounter: Secondary | ICD-10-CM | POA: Diagnosis not present

## 2014-07-05 DIAGNOSIS — Y998 Other external cause status: Secondary | ICD-10-CM | POA: Insufficient documentation

## 2014-07-05 DIAGNOSIS — R52 Pain, unspecified: Secondary | ICD-10-CM | POA: Diagnosis not present

## 2014-07-05 DIAGNOSIS — Y9389 Activity, other specified: Secondary | ICD-10-CM | POA: Insufficient documentation

## 2014-07-05 DIAGNOSIS — Z7982 Long term (current) use of aspirin: Secondary | ICD-10-CM | POA: Insufficient documentation

## 2014-07-05 DIAGNOSIS — Z8742 Personal history of other diseases of the female genital tract: Secondary | ICD-10-CM | POA: Diagnosis not present

## 2014-07-05 DIAGNOSIS — Z905 Acquired absence of kidney: Secondary | ICD-10-CM | POA: Diagnosis not present

## 2014-07-05 DIAGNOSIS — S79912A Unspecified injury of left hip, initial encounter: Secondary | ICD-10-CM | POA: Diagnosis not present

## 2014-07-05 DIAGNOSIS — Y9289 Other specified places as the place of occurrence of the external cause: Secondary | ICD-10-CM | POA: Insufficient documentation

## 2014-07-05 DIAGNOSIS — Z87891 Personal history of nicotine dependence: Secondary | ICD-10-CM | POA: Insufficient documentation

## 2014-07-05 DIAGNOSIS — S7002XA Contusion of left hip, initial encounter: Secondary | ICD-10-CM | POA: Diagnosis not present

## 2014-07-05 DIAGNOSIS — M25552 Pain in left hip: Secondary | ICD-10-CM | POA: Diagnosis not present

## 2014-07-05 DIAGNOSIS — I10 Essential (primary) hypertension: Secondary | ICD-10-CM | POA: Insufficient documentation

## 2014-07-05 DIAGNOSIS — M6628 Spontaneous rupture of extensor tendons, other site: Secondary | ICD-10-CM | POA: Diagnosis not present

## 2014-07-05 MED ORDER — OXYCODONE-ACETAMINOPHEN 5-325 MG PO TABS
2.0000 | ORAL_TABLET | Freq: Once | ORAL | Status: AC
  Administered 2014-07-05: 2 via ORAL
  Filled 2014-07-05: qty 2

## 2014-07-05 NOTE — ED Provider Notes (Signed)
CSN: [id number]     Arrival date & time 07/05/14  1832 History   First MD Initiated Contact with Patient 07/05/14 1930     Chief Complaint  Patient presents with  . Hip Pain    left  . Pelvic Pain     (Consider location/radiation/quality/duration/timing/severity/associated sxs/prior Treatment) Patient is a 64 y.o. female presenting with hip pain. The history is provided by the patient. No language interpreter was used.  Hip Pain This is a new problem. The current episode started today. The problem occurs constantly. The problem has been unchanged. Associated symptoms include joint swelling and myalgias. Nothing aggravates the symptoms. She has tried nothing for the symptoms. The treatment provided moderate relief.  Pt reports she has trouble walking due to guillain-barre.  Pt reports her hip popped as she was opening the door.  Pt reports Dr. Eddie Dibbles told her that her hip could break easily.  Past Medical History  Diagnosis Date  . Diabetes mellitus     2  . Hyperlipidemia   . Hypertension   . Renal mass     BEING WORKED UP BY UROLOGY  . Chronic inflammatory demyelinating polyneuritis   . Guillain-Barre syndrome   . Renal disorder     L kidney removed  . Cancer   . 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 Cesarean section    . Cholecystectomy    . Nephrectomy     Family History  Problem Relation Age of Onset  . Cancer Father     PROSTATE  . Cancer Sister     BREAST  . Healthy Mother    History  Substance Use Topics  . Smoking status: Former Research scientist (life sciences)  . Smokeless tobacco: Never Used  . Alcohol Use: No   OB History    No data available     Review of Systems  Musculoskeletal: Positive for myalgias and joint swelling.  All other systems reviewed and are negative.     Allergies  Biaxin; Codeine; Erythromycin; Januvia; and Prednisone  Home Medications   Prior to Admission medications   Medication  Sig Start Date End Date Taking? Authorizing Provider  amitriptyline (ELAVIL) 10 MG tablet Take 4 tablets (40 mg total) by mouth at bedtime. 06/23/14  Yes [name], MD  aspirin EC 81 MG tablet Take 81 mg by mouth daily.   Yes Historical Provider, MD  atorvastatin (LIPITOR) 10 MG tablet Take 1 tablet by mouth 3 times per week Patient taking differently: Take 1 tablet by mouth 3 times per week on Tuesday, Thursday, and Saturday. 06/03/14  Yes Alferd Apa Lowne, DO  calcium citrate (CALCITRATE - DOSED IN MG ELEMENTAL CALCIUM) 950 MG tablet Take 200 mg of elemental calcium by mouth daily.   Yes Historical Provider, MD  cholecalciferol (VITAMIN D) 1000 UNITS tablet Take 5,000 Units by mouth daily.   Yes Historical Provider, MD  COD LIVER OIL PO Take 1 capsule by mouth daily.   Yes Historical Provider, MD  docusate sodium (COLACE) 100 MG capsule Take 1 capsule (100 mg total) by mouth every 12 (twelve) hours. Patient taking differently: Take 100 mg by mouth daily as needed for mild constipation or moderate constipation.  03/15/14  Yes Kristen N Ward, DO  glucose blood (FREESTYLE LITE) test strip Use to test blood sugar 2 times daily as instructed. Dx code: E11.29 06/03/14  Yes Rosalita Chessman, DO  Insulin Glargine (LANTUS  SOLOSTAR) 100 UNIT/ML Solostar Pen Inject 15 Units into the skin daily at 10 pm. 04/09/14  Yes Philemon Kingdom, MD  insulin lispro (HUMALOG KWIKPEN) 100 UNIT/ML KiwkPen Inject 0.06-0.08 mLs (6-8 Units total) into the skin daily with supper. 05/26/14  Yes Philemon Kingdom, MD  Insulin Pen Needle 32G X 4 MM MISC Use 1x a day 04/09/14  Yes Philemon Kingdom, MD  lisinopril (PRINIVIL,ZESTRIL) 20 MG tablet Take 20 mg by mouth at bedtime.    Yes Historical Provider, MD  metFORMIN (GLUCOPHAGE-XR) 500 MG 24 hr tablet TAKE 1 TABLET (500 MG TOTAL) BY MOUTH 2 (TWO) TIMES DAILY. Patient taking differently: Take 1 tablet (500 mg total) by mouth 2 (two) times daily. 05/20/14  Yes Philemon Kingdom, MD   nateglinide (STARLIX) 120 MG tablet Take 1 tablet (120 mg total) by mouth 3 (three) times daily with meals. 01/25/14  Yes Philemon Kingdom, MD  oxyCODONE-acetaminophen (PERCOCET/ROXICET) 5-325 MG per tablet Take 1 tablet by mouth every 4 (four) hours as needed. Patient taking differently: Take 1 tablet by mouth every 4 (four) hours as needed.  03/15/14  Yes Kristen N Ward, DO  promethazine (PHENERGAN) 25 MG tablet TAKE 1 TABLET (25 MG TOTAL) BY MOUTH AS NEEDED AS DIRECTED BY DR Brett Fairy  FOR NAUSEA. Patient taking differently: Take 1 tablet (25 mg total) by mouth as needed, as directed by Dr. Brett Fairy for nausea. 05/04/14  Yes Asencion Partridge Dohmeier, MD  traMADol Veatrice Bourbon) 50 MG tablet Take 1 tablet in the morning and 2 tablets at night 05/10/14  Yes [name], MD  ALPRAZolam Duanne Moron) 0.25 MG tablet Take 0.25 mg by mouth 3 (three) times daily as needed for anxiety.  01/21/14   Historical Provider, MD  gabapentin (NEURONTIN) 300 MG capsule The patient can take 1 capsule of 3oo mg in the morning, 2 capsules at lunch 1 capsule in the afternoon and 2 capsules at bedtime. TOTAL 1800 mg Patient taking differently: The patient can take 1 capsule of 300 mg in the morning, 2 capsules (600 mg) at lunch,  1 capsule (300 mg)  in the afternoon,  and 2 capsules (600 mg)   at bedtime. TOTAL 1800 mg 06/23/14   [name], MD  metoCLOPramide (REGLAN) 10 MG tablet Take 1 tablet (10 mg total) by mouth 4 (four) times daily -  before meals and at bedtime. Patient not taking: Reported on 06/23/2014 12/31/13   Alferd Apa Lowne, DO  ondansetron (ZOFRAN ODT) 4 MG disintegrating tablet Take 1 tablet (4 mg total) by mouth every 8 (eight) hours as needed for nausea or vomiting. Patient not taking: Reported on 06/23/2014 03/15/14   Kristen N Ward, DO  Suvorexant (BELSOMRA) 10 MG TABS Take 1 tablet by mouth at bedtime. Patient not taking: Reported on 07/05/2014 02/18/14   [name], MD  Suvorexant (BELSOMRA) 15 MG TABS Take 15 mg by  mouth Nightly. Patient not taking: Reported on 07/05/2014 02/11/14   Asencion Partridge Dohmeier, MD   BP 177/59 mmHg  Pulse 83  Temp(Src) 97.8 F (36.6 C) (Oral)  Resp 17  SpO2 99% Physical Exam  Constitutional: She is oriented to person, place, and time. She appears well-developed and well-nourished.  HENT:  Head: Normocephalic and atraumatic.  Eyes: EOM are normal. Pupils are equal, round, and reactive to light.  Neck: Normal range of motion.  Cardiovascular: Normal rate and normal heart sounds.   Pulmonary/Chest: Effort normal.  Abdominal: She exhibits no distension.  Musculoskeletal: She exhibits tenderness.  Tender left hip, no shortening, pain with  movement.  Neurological: She is alert and oriented to person, place, and time.  Skin: Skin is warm.  Psychiatric: She has a normal mood and affect.  Nursing note and vitals reviewed.   ED Course  Procedures (including critical care time) Labs Review Labs Reviewed - No data to display  Imaging Review Dg Hip Unilat With Pelvis 2-3 Views Left  07/05/2014   CLINICAL DATA:  Hurt left hip bending over today  EXAM: LEFT HIP (WITH PELVIS) 2-3 VIEWS  COMPARISON:  None.  FINDINGS: Pelvic bones are intact with no fracture. No evidence proximal femur fracture or dislocation. No significant hip joint space narrowing.  IMPRESSION: No acute findings   Electronically Signed   By: Skipper Cliche M.D.   On: 07/05/2014 19:36     EKG Interpretation None      MDM  Pt had difficulty ambulating.   Ct scan shows no fracture.  Pt reports some pain relief with percocoet.   Pt has a walker and a wheelchair at home.  Pt has pain medication at home   Final diagnoses:  Hip joint pain, left    Return if any problems.      Fransico Meadow, PA-C 07/05/14 [place], MD 07/09/14 2033

## 2014-07-05 NOTE — ED Notes (Signed)
Patient was at the door attempting to open it when her dog jumped up. Patient reports feeling left hip "pop". She now reports pain 6/10, 50 mcg fentanyl given intranasal. No shortening and or rotation however with flexion/extension patient has pain in pelvic area as well as pain with palpation. 140/76,92,97%ra cbg 267 pt just ate. Pt currently being treated for Guillain -burre. Pt was standing when EMS arrived but unable to sit

## 2014-07-05 NOTE — ED Notes (Signed)
Patient denied questions, concerns at this time. Wheelchair used for transport to car

## 2014-07-05 NOTE — ED Notes (Signed)
Bed: WA25 Expected date:  Expected time:  Means of arrival:  Comments: EMS  

## 2014-07-05 NOTE — Discharge Instructions (Signed)
Hip Pain Your hip is the joint between your upper legs and your lower pelvis. The bones, cartilage, tendons, and muscles of your hip joint perform a lot of work each day supporting your body weight and allowing you to move around. Hip pain can range from a minor ache to severe pain in one or both of your hips. Pain may be felt on the inside of the hip joint near the groin, or the outside near the buttocks and upper thigh. You may have swelling or stiffness as well.  HOME CARE INSTRUCTIONS   Take medicines only as directed by your health care provider.  Apply ice to the injured area:  Put ice in a plastic bag.  Place a towel between your skin and the bag.  Leave the ice on for 15-20 minutes at a time, 3-4 times a day.  Keep your leg raised (elevated) when possible to lessen swelling.  Avoid activities that cause pain.  Follow specific exercises as directed by your health care provider.  Sleep with a pillow between your legs on your most comfortable side.  Record how often you have hip pain, the location of the pain, and what it feels like. SEEK MEDICAL CARE IF:   You are unable to put weight on your leg.  Your hip is red or swollen or very tender to touch.  Your pain or swelling continues or worsens after 1 week.  You have increasing difficulty walking.  You have a fever. SEEK IMMEDIATE MEDICAL CARE IF:   You have fallen.  You have a sudden increase in pain and swelling in your hip. MAKE SURE YOU:   Understand these instructions.  Will watch your condition.  Will get help right away if you are not doing well or get worse. Document Released: 07/19/2009 Document Revised: 06/15/2013 Document Reviewed: 09/25/2012 [employer] Patient Information 2015 [employer]. This information is not intended to replace advice given to you by your health care provider. Make sure you discuss any questions you have with your health care provider.  

## 2014-07-05 NOTE — ED Notes (Signed)
Patient transported to X-ray 

## 2014-07-05 NOTE — ED Notes (Signed)
Bed: [facility] Expected date:  Expected time:  Means of arrival:  Comments: 

## 2014-07-07 DIAGNOSIS — M25552 Pain in left hip: Secondary | ICD-10-CM | POA: Diagnosis not present

## 2014-07-08 ENCOUNTER — Encounter: Payer: Self-pay | Admitting: Internal Medicine

## 2014-07-08 ENCOUNTER — Ambulatory Visit (INDEPENDENT_AMBULATORY_CARE_PROVIDER_SITE_OTHER): Payer: Medicare Other | Admitting: Internal Medicine

## 2014-07-08 VITALS — BP 124/68 | HR 77 | Temp 98.1°F | Resp 12

## 2014-07-08 DIAGNOSIS — IMO0002 Reserved for concepts with insufficient information to code with codable children: Secondary | ICD-10-CM

## 2014-07-08 DIAGNOSIS — E1129 Type 2 diabetes mellitus with other diabetic kidney complication: Secondary | ICD-10-CM | POA: Diagnosis not present

## 2014-07-08 DIAGNOSIS — E1165 Type 2 diabetes mellitus with hyperglycemia: Secondary | ICD-10-CM

## 2014-07-08 LAB — HEMOGLOBIN A1C: Hgb A1c MFr Bld: 7.6 % — ABNORMAL HIGH (ref 4.6–6.5)

## 2014-07-08 MED ORDER — INSULIN GLARGINE 100 UNIT/ML SOLOSTAR PEN: 18.0000 [IU] | PEN_INJECTOR | Freq: Every day | SUBCUTANEOUS | Status: DC

## 2014-07-08 MED ORDER — INSULIN LISPRO 100 UNIT/ML (KWIKPEN)
8.0000 [IU] | PEN_INJECTOR | Freq: Every day | SUBCUTANEOUS | Status: DC
Start: 2014-07-08 — End: 2015-03-07

## 2014-07-08 NOTE — Patient Instructions (Signed)
Please continue: - Starlix 120 mg 3x a day with meals - Metformin ER 500 mg 2x a day  Please increase: - Lantus 15 >> 18 units at bedtime  Please increase NovoLog - with dinner: - 8 units before a regular meal - 10 units before a larger meal or if you have dessert  Please return in 3 months with your sugar log.   Please stop at the lab.

## 2014-07-08 NOTE — Progress Notes (Signed)
Patient ID: Nicole Strong, female   DOB: 07-19-1950, 64 y.o.   MRN: [id number]  HPI: IYANAH Strong is a 64 y.o.-year-old female, returning for f/u for DM2, dx in ~2009, insulin-dependent, uncontrolled, with complications (CKD). Last visit 1.5 mo ago.  Last hemoglobin A1c was: Lab Results  Component Value Date   HGBA1C 11.1* 04/09/2014   HGBA1C 10.0* 12/31/2013   HGBA1C 11.3* 08/14/2013   Pt is on a regimen of: - Starlix 120 mg 3x a day with meals - Metformin ER 500 mg 2x a day - Lantus 15 units at bedtime  Humalog - with dinner (sometimes she does take it at bedtime!): - 6 units before a regular meal - 8 units before a larger meal or if you have dessert  She had Metformin >> diarrhea >> had to stop it She tried Januvia >> had diarrhea from it. She was started on Tradjenta >> could not afford it, also blocked the abs of her other meds. She was on Wechol 1000 mg po bid >> could not afford it She was on Actos >> sugars better, but had bladder cancer  Pt checks her sugars 1x a day and they are still not at goal: - am: 200s. 300 this am (Glucerna last night) >> 130-180, 205 >> 136-185 - 2h after b'fast: n/c >> 181-205 >> 173-219 - before lunch: 200s >> n/c >> 180 - 2h after lunch: n/c >> n/c - before dinner: 200s >> 121-199 >> 106x1, 186-215 - 2h after dinner: n/c >> 158-205 >> 188, 204 - bedtime: n/c >> 192-312 >> 180-213, 283 - nighttime: n/c >> 121-208 >> 149-186 No lows. Lowest sugar was 200 >> 130 >> 103; she has hypoglycemia awareness at 200.  Highest sugar was >600 - ED visit >> 300s >> 286 x1  Glucometer: Freestyle Lite  Pt's meals are: - Breakfast: glucerna, may skip, b/c gastroparesis - Lunch >> mostly brunch: 1/2 sandwich - Dinner: salad or TV dinner or egg + English muffin - Snacks: cheese, PB crackers No sodas  - + CKD, last BUN/creatinine:  Lab Results  Component Value Date   BUN 22 04/29/2014   CREATININE 1.14 04/29/2014  She had L nephrectomy 2011. -  last set of lipids: Lab Results  Component Value Date   CHOL 127 04/29/2014   HDL 53.10 04/29/2014   LDLCALC 46 04/29/2014   LDLDIRECT 210.4 12/31/2013   TRIG 141.0 04/29/2014   CHOLHDL 2 04/29/2014  On Zocor >> mm pain >> stopped >> did not start Lipitor as suggested. - last eye exam was in 03/2013. No DR. Has double vision from GBS. - + numbness and tingling in her feet. - she has gastroparesis, no Reglan.  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  She had pancreatitis >> ED 03/15/2014. On 04/06/2014 >> went to ED with Hgly , CBG >600.  ROS: Constitutional: + weight gain, + fatigue, no subjective hypothermia Eyes: no blurry vision, no xerophthalmia ENT: no sore throat, no nodules palpated in throat, no dysphagia/odynophagia, no hoarseness Cardiovascular: no CP/SOB/palpitations/leg swelling Respiratory: no cough/SOB Gastrointestinal: no N/V/D/C/acid reflux Musculoskeletal: + muscle/+ joint aches (l hip) - will have MRI soon Skin: no rashes Neurological: no tremors/numbness/tingling/dizziness  I reviewed pt's medications, allergies, PMH, social hx, family hx, and changes were documented in the history of present illness. Otherwise, unchanged from my initial visit note:  Past Medical History  Diagnosis Date  . Diabetes mellitus     2  .  Hyperlipidemia   . Hypertension   . Renal mass     BEING WORKED UP BY UROLOGY  . Chronic inflammatory demyelinating polyneuritis   . Guillain-Barre syndrome   . Renal disorder     L kidney removed  . Cancer   . 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 Cesarean section    . Cholecystectomy    . Nephrectomy     History   Social History  . Marital Status: Divorced    Spouse Name: N/A    Number of Children: 1  . Years of Education: 14   Occupational History  .      disabled   Social History Main Topics  . Smoking status: Former Research scientist (life sciences)  .  Smokeless tobacco: Never Used  . Alcohol Use: No  . Drug Use: No   Social History Narrative   Patient lives alone.    Disabled.   Education college education.   Right handed.   Caffeine. Rare one cup.   Current Outpatient Prescriptions on File Prior to Visit  Medication Sig Dispense Refill  . amitriptyline (ELAVIL) 10 MG tablet TAKE 4 TABS AT BEDTIME  FOR 1 WEEK THEN INCREASE TO 5 AT BEDTIME IF NECESSARY 150 tablet 3  . aspirin EC 81 MG tablet Take 81 mg by mouth daily.    . calcium citrate (CALCITRATE - DOSED IN MG ELEMENTAL CALCIUM) 950 MG tablet Take 200 mg of elemental calcium by mouth daily.    . cholecalciferol (VITAMIN D) 1000 UNITS tablet Take 5,000 Units by mouth daily.    . COD LIVER OIL PO Take 1 capsule by mouth daily.    Marland Kitchen gabapentin (NEURONTIN) 300 MG capsule TAKE 1 CAPSULE (300 MG TOTAL) BY MOUTH 4 (FOUR) TIMES DAILY. 120 capsule 3  . linagliptin (TRADJENTA) 5 MG TABS tablet Take 1 tablet (5 mg total) by mouth daily. 30 tablet 2  . lisinopril (PRINIVIL,ZESTRIL) 20 MG tablet Take 20 mg by mouth daily.    . metFORMIN (GLUCOPHAGE) 1000 MG tablet TAKE 1 TABLET (1,000 MG TOTAL) BY MOUTH 2 (TWO) TIMES DAILY WITH A MEAL. OFFICE VISIT DUE FOR MORE REFILLS. 60 tablet 0  . metoCLOPramide (REGLAN) 10 MG tablet Take 1 tablet (10 mg total) by mouth 4 (four) times daily -  before meals and at bedtime. 120 tablet 2  . nateglinide (STARLIX) 60 MG tablet Take 1 tablet (60 mg total) by mouth 3 (three) times daily with meals. 90 tablet 2  . promethazine (PHENERGAN) 25 MG tablet TAKE 1 TABLET (25 MG TOTAL) BY MOUTH AS NEEDED as directed by dr dohmeier  FOR NAUSEA. 90 tablet 1  . simvastatin (ZOCOR) 40 MG tablet Take 1 tablet (40 mg total) by mouth at bedtime. 30 tablet 2  . traMADol (ULTRAM) 50 MG tablet Take 50-100 mg by mouth every 6 (six) hours as needed for moderate pain. Take 1 tablet in the morning and 2 tablets at night     No current facility-administered medications on file prior to  visit.   Allergies  Allergen Reactions  . Biaxin [Clarithromycin] Other (See Comments)    "face turns red"  . Codeine Nausea And Vomiting and Other (See Comments)    Hallucinations  . Erythromycin Nausea And Vomiting  . Januvia [Sitagliptin]   . Prednisone     Mania.   Family History  Problem Relation Age of Onset  . Cancer Father  PROSTATE  . Cancer Sister     BREAST  . Healthy Mother    PE: BP 124/68 mmHg  Pulse 77  Temp(Src) 98.1 F (36.7 C) (Oral)  Resp 12  SpO2 97% There is no weight on file to calculate BMI. Wt Readings from Last 3 Encounters:  05/25/14 203 lb (92.08 kg)  04/09/14 195 lb (88.451 kg)  03/01/14 204 lb (92.534 kg)   Constitutional: overweight, in NAD, in wheelchair Eyes: PERRLA, EOMI, no exophthalmos ENT: moist mucous membranes, no thyromegaly, no cervical lymphadenopathy Cardiovascular: RRR, No MRG Respiratory: CTA B Gastrointestinal: abdomen soft, NT, ND, BS+ Musculoskeletal: no deformities, strength intact in all 4 Skin: moist, warm, no rashes Neurological: no tremor with outstretched hands, DTR not elicited  ASSESSMENT: 1. DM2, insulin-dependent, uncontrolled, with complications - CKD  She has Gastroparesis (self-dx) 2/2 GB.  PLAN:  1. Patient with long-standing, uncontrolled diabetes, on oral antidiabetic regimen, which improved after starting Lantus and then NovoLog with dinner, but still high sugars throughout the day >> will increase Lantus and Novolog - I suggested to:  Patient Instructions  Please continue: - Starlix 120 mg 3x a day with meals - Metformin ER 500 mg 2x a day  Please increase: - Lantus 15 >> 18 units at bedtime  Please increase NovoLog - with dinner: - 8 units before a regular meal - 10 units before a larger meal or if you have dessert  Please return in 3 months with your sugar log.   Please stop at the lab.  - continue checking sugars at different times of the day - check 2 times a day, rotating  checks - advised for yearly eye exams >> she is due - Check HbA1c today - Return to clinic in 3 mo with sugar log   Office Visit on 07/08/2014  Component Date Value Ref Range Status  . Hgb A1c MFr Bld 07/08/2014 7.6* 4.6 - 6.5 % Final   Glycemic Control Guidelines for People with Diabetes:Non Diabetic:  <6%Goal of Therapy: <7%Additional Action Suggested:  >8%    hemoglobin A1c much improved.

## 2014-07-19 ENCOUNTER — Encounter: Payer: Self-pay | Admitting: Internal Medicine

## 2014-07-20 DIAGNOSIS — K589 Irritable bowel syndrome without diarrhea: Secondary | ICD-10-CM | POA: Diagnosis not present

## 2014-07-20 DIAGNOSIS — K5900 Constipation, unspecified: Secondary | ICD-10-CM | POA: Diagnosis not present

## 2014-07-20 DIAGNOSIS — R109 Unspecified abdominal pain: Secondary | ICD-10-CM | POA: Diagnosis not present

## 2014-07-20 DIAGNOSIS — R197 Diarrhea, unspecified: Secondary | ICD-10-CM | POA: Diagnosis not present

## 2014-07-23 ENCOUNTER — Other Ambulatory Visit: Payer: Self-pay | Admitting: Family Medicine

## 2014-07-28 DIAGNOSIS — M25552 Pain in left hip: Secondary | ICD-10-CM | POA: Diagnosis not present

## 2014-08-04 ENCOUNTER — Encounter: Payer: Self-pay | Admitting: Neurology

## 2014-08-05 ENCOUNTER — Encounter: Payer: Self-pay | Admitting: Neurology

## 2014-08-05 NOTE — Telephone Encounter (Signed)
Called pt. She says that on Sunday she had "seizure like" activity while she was napping. She felt like it was an "electrical impulse" and her bilateral wrist and ankles were sore and achy afterwards. Pt is requesting a sooner appt than 8/1 with Jinny Blossom, NP. However, since this is a new problem, she needs to see Dr. Brett Fairy. Informed the pt, and offered her the next appt of 7/25. She declined this appt. She asked to be seen on 8/1 with Dr. Brett Fairy. Appt made. Pt said she will go to her family medicine doctor if she has the seizure like activity again if it happens before 8/1.

## 2014-08-09 DIAGNOSIS — M25552 Pain in left hip: Secondary | ICD-10-CM | POA: Diagnosis not present

## 2014-08-17 ENCOUNTER — Encounter: Payer: Self-pay | Admitting: Family Medicine

## 2014-08-17 DIAGNOSIS — M25552 Pain in left hip: Secondary | ICD-10-CM | POA: Diagnosis not present

## 2014-08-18 ENCOUNTER — Ambulatory Visit (INDEPENDENT_AMBULATORY_CARE_PROVIDER_SITE_OTHER): Payer: Medicare Other | Admitting: Internal Medicine

## 2014-08-18 ENCOUNTER — Other Ambulatory Visit: Payer: Self-pay | Admitting: Family Medicine

## 2014-08-18 ENCOUNTER — Encounter: Payer: Self-pay | Admitting: Internal Medicine

## 2014-08-18 ENCOUNTER — Ambulatory Visit (HOSPITAL_BASED_OUTPATIENT_CLINIC_OR_DEPARTMENT_OTHER)
Admission: RE | Admit: 2014-08-18 | Discharge: 2014-08-18 | Disposition: A | Discharge status: Home / Self Care | Payer: Medicare Other | Source: Ambulatory Visit | Attending: Internal Medicine | Admitting: Internal Medicine

## 2014-08-18 ENCOUNTER — Ambulatory Visit: Payer: Medicare Other | Admitting: Adult Health

## 2014-08-18 VITALS — BP 126/66 | HR 77 | Temp 98.6°F | Ht 61.0 in | Wt 213.5 lb

## 2014-08-18 DIAGNOSIS — R05 Cough: Secondary | ICD-10-CM | POA: Diagnosis not present

## 2014-08-18 DIAGNOSIS — J209 Acute bronchitis, unspecified: Secondary | ICD-10-CM

## 2014-08-18 DIAGNOSIS — R059 Cough, unspecified: Secondary | ICD-10-CM

## 2014-08-18 DIAGNOSIS — J918 Pleural effusion in other conditions classified elsewhere: Secondary | ICD-10-CM | POA: Insufficient documentation

## 2014-08-18 MED ORDER — ALBUTEROL SULFATE HFA 108 (90 BASE) MCG/ACT IN AERS: 2.0000 | INHALATION_SPRAY | Freq: Four times a day (QID) | RESPIRATORY_TRACT | Status: AC | PRN

## 2014-08-18 MED ORDER — DOXYCYCLINE HYCLATE 100 MG PO TBEC: 100.0000 mg | DELAYED_RELEASE_TABLET | Freq: Two times a day (BID) | ORAL | Status: DC

## 2014-08-18 NOTE — Progress Notes (Signed)
Subjective:    Patient ID: Nicole Strong, female    DOB: 09-07-1950, 64 y.o.   MRN: [id number]  DOS:  08/18/2014 Type of visit - description : Acute Interval history: Symptoms started about 10 days ago with on and off cough. 3 days ago she was exposed to fumes from a cleaning liquid, since then states is feeling worse: Some wheezing, increased cough, chest congestion, occasional sputum, small amount. Today she developed nasal congestion, nasal discharge with some blood. Has been having a temperature up to 99 on and off since the onset of symptoms   Review of Systems  Denies any sore throat or eye irritation except for the few hours that she was exposed to a few No chest pain Minimal nausea no vomiting. Denies any history of asthma but she is a former smoker in the past she has been prescribed inhalers.  Past Medical History  Diagnosis Date  . Diabetes mellitus     2  . Hyperlipidemia   . Hypertension   . Renal mass     BEING WORKED UP BY UROLOGY  . Chronic inflammatory demyelinating polyneuritis   . Guillain-Barre syndrome   . Renal disorder     L kidney removed  . Cancer   . 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 Cesarean section    . Cholecystectomy    . Nephrectomy      History   Social History  . Marital Status: Divorced    Spouse Name: N/A  . Number of Children: 1  . Years of Education: 14   Occupational History  .      disabled   Social History Main Topics  . Smoking status: Former Research scientist (life sciences)  . Smokeless tobacco: Never Used  . Alcohol Use: No  . Drug Use: No  . Sexual Activity: Not on file   Other Topics Concern  . Not on file   Social History Narrative   Patient lives at home alone.    Disabled.   Education college education.   Right handed.   Caffeine. Rare one cup.        Medication List       This list is accurate as of: 08/18/14 11:59 PM.  Always use your most recent  med list.               albuterol 108 (90 BASE) MCG/ACT inhaler  Commonly known as:  VENTOLIN HFA  Inhale 2 puffs into the lungs every 6 (six) hours as needed for wheezing or shortness of breath.     ALPRAZolam 0.25 MG tablet  Commonly known as:  XANAX  Take 0.25 mg by mouth 3 (three) times daily as needed for anxiety.     amitriptyline 10 MG tablet  Commonly known as:  ELAVIL  Take 4 tablets (40 mg total) by mouth at bedtime.     aspirin EC 81 MG tablet  Take 81 mg by mouth daily.     atorvastatin 10 MG tablet  Commonly known as:  LIPITOR  TAKE 1 TABLET BY MOUTH 3 TIMES PER WEEK     calcium citrate 950 MG tablet  Commonly known as:  CALCITRATE - dosed in mg elemental calcium  Take 200 mg of elemental calcium by mouth daily.     cholecalciferol 1000 UNITS tablet  Commonly known as:  VITAMIN D  Take 5,000 Units by mouth daily.  COD LIVER OIL PO  Take 1 capsule by mouth daily.     docusate sodium 100 MG capsule  Commonly known as:  COLACE  Take 1 capsule (100 mg total) by mouth every 12 (twelve) hours.     doxycycline 100 MG EC tablet  Commonly known as:  DORYX  Take 1 tablet (100 mg total) by mouth 2 (two) times daily.     gabapentin 300 MG capsule  Commonly known as:  NEURONTIN  The patient can take 1 capsule of 3oo mg in the morning, 2 capsules at lunch 1 capsule in the afternoon and 2 capsules at bedtime. TOTAL 1800 mg     glucose blood test strip  Commonly known as:  FREESTYLE LITE  Use to test blood sugar 2 times daily as instructed. Dx code: E11.29     Insulin Glargine 100 UNIT/ML Solostar Pen  Commonly known as:  LANTUS SOLOSTAR  Inject 18 Units into the skin daily at 10 pm.     insulin lispro 100 UNIT/ML KiwkPen  Commonly known as:  HUMALOG KWIKPEN  Inject 0.08-0.1 mLs (8-10 Units total) into the skin daily with supper.     Insulin Pen Needle 32G X 4 MM Misc  Use 1x a day     lisinopril 20 MG tablet  Commonly known as:  PRINIVIL,ZESTRIL  Take  20 mg by mouth at bedtime.     metFORMIN 500 MG 24 hr tablet  Commonly known as:  GLUCOPHAGE-XR  TAKE 1 TABLET (500 MG TOTAL) BY MOUTH 2 (TWO) TIMES DAILY.     metoCLOPramide 10 MG tablet  Commonly known as:  REGLAN  Take 1 tablet (10 mg total) by mouth 4 (four) times daily -  before meals and at bedtime.     nateglinide 120 MG tablet  Commonly known as:  STARLIX  Take 1 tablet (120 mg total) by mouth 3 (three) times daily with meals.     ondansetron 4 MG disintegrating tablet  Commonly known as:  ZOFRAN ODT  Take 1 tablet (4 mg total) by mouth every 8 (eight) hours as needed for nausea or vomiting.     oxyCODONE-acetaminophen 5-325 MG per tablet  Commonly known as:  PERCOCET/ROXICET  Take 1 tablet by mouth every 4 (four) hours as needed.     promethazine 25 MG tablet  Commonly known as:  PHENERGAN  TAKE 1 TABLET (25 MG TOTAL) BY MOUTH AS NEEDED AS DIRECTED BY DR Brett Fairy  FOR NAUSEA.     Suvorexant 15 MG Tabs  Commonly known as:  BELSOMRA  Take 15 mg by mouth Nightly.     Suvorexant 10 MG Tabs  Commonly known as:  BELSOMRA  Take 1 tablet by mouth at bedtime.     traMADol 50 MG tablet  Commonly known as:  ULTRAM  Take 1 tablet in the morning and 2 tablets at night           Objective:   Physical Exam BP 126/66 mmHg  Pulse 77  Temp(Src) 98.6 F (37 C) (Oral)  Ht 5\' 1"  (1.549 m)  Wt 213 lb 8 oz (96.843 kg)  BMI 40.36 kg/m2  SpO2 92% General:   Well developed, well nourished . NAD.  HEENT:  Normocephalic . Face symmetric, atraumatic A slightly congested, TMs normal, throat symmetric and not red Lungs:  prolonged expiratory time, few rhonchi otherwise clear. No respiratory distress  Heart: RRR,  no murmur.  No pretibial edema bilaterally  Skin: Not pale. Not jaundice Neurologic:  alert & oriented X3.  Speech normal, gait appropriate for age and unassisted Psych--  Cognition and judgment appear intact.  Cooperative with normal attention span and  concentration.  Behavior appropriate. No anxious or depressed appearing.        Assessment & Plan:    Bronchitis, Bronchitis and bronchospasm by history. Bronchospasm from infection versus secondary to fumes exposure. Plan: Chest x-ray, doxycycline, inhalers, see instructions.

## 2014-08-18 NOTE — Progress Notes (Signed)
Pre visit review using our clinic review tool, if applicable. No additional management support is needed unless otherwise documented below in the visit note. 

## 2014-08-18 NOTE — Patient Instructions (Signed)
  Stop by the first floor and get the XR    Rest, fluids , tylenol   Mucinex DM twice a day until better  Ventolin: 2 Sprays 3 times a day for the next 3 days, then as needed  Take the antibiotic as prescribed  (doxycycline)  Call if not gradually better over the next  10 days Call anytime if the symptoms are severe

## 2014-08-19 ENCOUNTER — Telehealth: Payer: Self-pay | Admitting: *Deleted

## 2014-08-19 ENCOUNTER — Encounter: Payer: Self-pay | Admitting: Internal Medicine

## 2014-08-19 NOTE — Telephone Encounter (Signed)
Prior authorization for doxycycline initiated. Awaiting determination. JG//CMA

## 2014-08-20 ENCOUNTER — Encounter: Payer: Self-pay | Admitting: Internal Medicine

## 2014-08-27 ENCOUNTER — Encounter: Payer: Self-pay | Admitting: Family Medicine

## 2014-09-06 DIAGNOSIS — M25551 Pain in right hip: Secondary | ICD-10-CM | POA: Diagnosis not present

## 2014-09-06 DIAGNOSIS — M79672 Pain in left foot: Secondary | ICD-10-CM | POA: Diagnosis not present

## 2014-09-07 ENCOUNTER — Ambulatory Visit: Payer: Medicare Other | Admitting: Physical Therapy

## 2014-09-13 ENCOUNTER — Ambulatory Visit: Payer: Self-pay | Admitting: Neurology

## 2014-09-13 ENCOUNTER — Ambulatory Visit: Payer: Medicare Other | Admitting: Adult Health

## 2014-10-03 ENCOUNTER — Encounter: Payer: Self-pay | Admitting: Physical Therapy

## 2014-10-08 ENCOUNTER — Other Ambulatory Visit (INDEPENDENT_AMBULATORY_CARE_PROVIDER_SITE_OTHER): Payer: Medicare Other | Admitting: *Deleted

## 2014-10-08 ENCOUNTER — Ambulatory Visit (HOSPITAL_BASED_OUTPATIENT_CLINIC_OR_DEPARTMENT_OTHER)
Admission: RE | Admit: 2014-10-08 | Discharge: 2014-10-08 | Disposition: A | Discharge status: Home / Self Care | Payer: Medicare Other | Source: Ambulatory Visit | Attending: Internal Medicine | Admitting: Internal Medicine

## 2014-10-08 ENCOUNTER — Encounter: Payer: Self-pay | Admitting: Internal Medicine

## 2014-10-08 ENCOUNTER — Telehealth: Payer: Self-pay | Admitting: Family Medicine

## 2014-10-08 ENCOUNTER — Ambulatory Visit (INDEPENDENT_AMBULATORY_CARE_PROVIDER_SITE_OTHER): Payer: Medicare Other | Admitting: Internal Medicine

## 2014-10-08 VITALS — BP 104/62 | HR 73 | Temp 98.4°F | Resp 14

## 2014-10-08 DIAGNOSIS — IMO0002 Reserved for concepts with insufficient information to code with codable children: Secondary | ICD-10-CM

## 2014-10-08 DIAGNOSIS — E1129 Type 2 diabetes mellitus with other diabetic kidney complication: Secondary | ICD-10-CM | POA: Diagnosis not present

## 2014-10-08 DIAGNOSIS — E1165 Type 2 diabetes mellitus with hyperglycemia: Secondary | ICD-10-CM | POA: Diagnosis not present

## 2014-10-08 DIAGNOSIS — E139 Other specified diabetes mellitus without complications: Secondary | ICD-10-CM

## 2014-10-08 DIAGNOSIS — J189 Pneumonia, unspecified organism: Secondary | ICD-10-CM

## 2014-10-08 DIAGNOSIS — J986 Disorders of diaphragm: Secondary | ICD-10-CM | POA: Diagnosis not present

## 2014-10-08 LAB — POCT GLYCOSYLATED HEMOGLOBIN (HGB A1C): Hemoglobin A1C: 8.1

## 2014-10-08 NOTE — Telephone Encounter (Signed)
Relation to pt: SELF  Call back number: [phone]   Reason for call:  Patient last office visit and chest x ray was  08/18/2014 and stated Dr. Larose Kells wanted patient to repeat chest x ray in 4 weeks. Please advise

## 2014-10-08 NOTE — Telephone Encounter (Signed)
LVM advising patient order has been placed.

## 2014-10-08 NOTE — Patient Instructions (Addendum)
Please continue: - Starlix 120 mg 3x a day with meals - Metformin ER 500 mg 2x a day - Lantus 18 units at bedtime - NovoLog - with dinner: - 8 units before a regular meal - 10 units before a larger meal or if you have dessert  Please return in 3 months with your sugar log.

## 2014-10-08 NOTE — Progress Notes (Signed)
Patient ID: Nicole Strong, female   DOB: 1950-12-28, 64 y.o.   MRN: [id number]  HPI: Nicole Strong is a 64 y.o.-year-old female, returning for f/u for DM2, dx in ~2009, insulin-dependent, uncontrolled, with complications (CKD). Last visit 3 mo ago.  She had a torn muscle in her L hip after bending >> ED visit 07/05/2014.  She then got a URI in 08/2014 >> bronchitis, ? PNA >> still cough and hoarseness. Sugars higher - was taking a lot of cough drops.  Last hemoglobin A1c was: Lab Results  Component Value Date   HGBA1C 7.6* 07/08/2014   HGBA1C 11.1* 04/09/2014   HGBA1C 10.0* 12/31/2013   Pt is on a regimen of: - Starlix 120 mg 3x a day with meals (missed doses) - Metformin ER 500 mg 2x a day  (missed doses) - Lantus 15 >> 18 units at bedtime - NovoLog - with dinner: - 8 units before a regular meal - 10 units before a larger meal or if you have dessert  She tried regular Metformin >> diarrhea >> had to stop it She tried Januvia >> had diarrhea from it. She tried Tradjenta >> could not afford it, also blocked the abs of her other meds. She tried Wechol 1000 mg po bid >> could not afford it She tried Actos >> sugars better, but had bladder cancer  Pt checks her sugars 1x a day and they are - am: 200s. 300 this am (Glucerna last night) >> 130-180, 205 >> 136-185 >> 100-140s - 2h after b'fast: n/c >> 181-205 >> 173-219 >> n/c - before lunch: 200s >> n/c >> 180 >> n/c - 2h after lunch: n/c >> n/c - before dinner: 200s >> 121-199 >> 106x1, 186-215 >. n/c - 2h after dinner: n/c >> 158-205 >> 188, 204 >> n/c - bedtime: n/c >> 192-312 >> 180-213, 283 >> 180-198 - nighttime: n/c >> 121-208 >> 149-186 No lows. Lowest sugar was 200 >> 130 >> 103 >> 107; she has hypoglycemia awareness at 200.  Highest sugar was >600 - ED visit >> 300s >> 286 x1 >> low 200 few times  Glucometer: Freestyle Lite  Pt's meals are: - Breakfast: glucerna, may skip, b/c gastroparesis - Lunch >> mostly brunch:  1/2 sandwich - Dinner: salad or TV dinner or egg + English muffin - Snacks: cheese, PB crackers No sodas  - + CKD, last BUN/creatinine:  Lab Results  Component Value Date   BUN 22 04/29/2014   CREATININE 1.14 04/29/2014  She had L nephrectomy 2011. - last set of lipids: Lab Results  Component Value Date   CHOL 127 04/29/2014   HDL 53.10 04/29/2014   LDLCALC 46 04/29/2014   LDLDIRECT 210.4 12/31/2013   TRIG 141.0 04/29/2014   CHOLHDL 2 04/29/2014  On Zocor >> mm pain >> stopped >> did not start Lipitor as suggested. - last eye exam was in 03/2013. No DR. Has double vision from GBS. - + numbness and tingling in her feet. - she has gastroparesis, no Reglan.  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 In 2011.  She had pancreatitis >> ED 03/15/2014. On 04/06/2014 >> went to ED with Hgly , CBG >600.  ROS: Constitutional: + weight gain, + fatigue, no subjective hypothermia, + poor sleep Eyes: no blurry vision, no xerophthalmia ENT: no sore throat, no nodules palpated in throat, no dysphagia/odynophagia, no hoarseness Cardiovascular: no CP/SOB/palpitations/leg swelling Respiratory: + cough/no SOB Gastrointestinal: no N/V/D/C/acid reflux Musculoskeletal: + muscle/no  joint aches (L hip) Skin: no rashes, + itching Neurological: no tremors/numbness/tingling/dizziness  I reviewed pt's medications, allergies, PMH, social hx, family hx, and changes were documented in the history of present illness. Otherwise, unchanged from my initial visit note:  Past Medical History  Diagnosis Date  . Diabetes mellitus     2  . Hyperlipidemia   . Hypertension   . Renal mass     BEING WORKED UP BY UROLOGY  . Chronic inflammatory demyelinating polyneuritis   . Guillain-Barre syndrome   . Renal disorder     L kidney removed  . Cancer   . 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 Cesarean section    .  Cholecystectomy    . Nephrectomy     History   Social History  . Marital Status: Divorced    Spouse Name: N/A    Number of Children: 1  . Years of Education: 14   Occupational History  .      disabled   Social History Main Topics  . Smoking status: Former Research scientist (life sciences)  . Smokeless tobacco: Never Used  . Alcohol Use: No  . Drug Use: No   Social History Narrative   Patient lives alone.    Disabled.   Education college education.   Right handed.   Caffeine. Rare one cup.   Current Outpatient Prescriptions on File Prior to Visit  Medication Sig Dispense Refill  . amitriptyline (ELAVIL) 10 MG tablet TAKE 4 TABS AT BEDTIME  FOR 1 WEEK THEN INCREASE TO 5 AT BEDTIME IF NECESSARY 150 tablet 3  . aspirin EC 81 MG tablet Take 81 mg by mouth daily.    . calcium citrate (CALCITRATE - DOSED IN MG ELEMENTAL CALCIUM) 950 MG tablet Take 200 mg of elemental calcium by mouth daily.    . cholecalciferol (VITAMIN D) 1000 UNITS tablet Take 5,000 Units by mouth daily.    . COD LIVER OIL PO Take 1 capsule by mouth daily.    Marland Kitchen gabapentin (NEURONTIN) 300 MG capsule TAKE 1 CAPSULE (300 MG TOTAL) BY MOUTH 4 (FOUR) TIMES DAILY. 120 capsule 3  . linagliptin (TRADJENTA) 5 MG TABS tablet Take 1 tablet (5 mg total) by mouth daily. 30 tablet 2  . lisinopril (PRINIVIL,ZESTRIL) 20 MG tablet Take 20 mg by mouth daily.    . metFORMIN (GLUCOPHAGE) 1000 MG tablet TAKE 1 TABLET (1,000 MG TOTAL) BY MOUTH 2 (TWO) TIMES DAILY WITH A MEAL. OFFICE VISIT DUE FOR MORE REFILLS. 60 tablet 0  . metoCLOPramide (REGLAN) 10 MG tablet Take 1 tablet (10 mg total) by mouth 4 (four) times daily -  before meals and at bedtime. 120 tablet 2  . nateglinide (STARLIX) 60 MG tablet Take 1 tablet (60 mg total) by mouth 3 (three) times daily with meals. 90 tablet 2  . promethazine (PHENERGAN) 25 MG tablet TAKE 1 TABLET (25 MG TOTAL) BY MOUTH AS NEEDED as directed by dr dohmeier  FOR NAUSEA. 90 tablet 1  . simvastatin (ZOCOR) 40 MG tablet Take 1  tablet (40 mg total) by mouth at bedtime. 30 tablet 2  . traMADol (ULTRAM) 50 MG tablet Take 50-100 mg by mouth every 6 (six) hours as needed for moderate pain. Take 1 tablet in the morning and 2 tablets at night     No current facility-administered medications on file prior to visit.   Allergies  Allergen Reactions  . Biaxin [  Clarithromycin] Other (See Comments)    "face turns red"  . Codeine Nausea And Vomiting and Other (See Comments)    Hallucinations  . Erythromycin Nausea And Vomiting  . Januvia [Sitagliptin]   . Prednisone     Mania.   Family History  Problem Relation Age of Onset  . Cancer Father     PROSTATE  . Cancer Sister     BREAST  . Healthy Mother    PE: BP 104/62 mmHg  Pulse 73  Temp(Src) 98.4 F (36.9 C) (Oral)  Resp 14  SpO2 94% There is no weight on file to calculate BMI. Wt Readings from Last 3 Encounters:  08/18/14 213 lb 8 oz (96.843 kg)  05/25/14 203 lb (92.08 kg)  04/09/14 195 lb (88.451 kg)   Constitutional: overweight, in NAD, in wheelchair Eyes: PERRLA, EOMI, no exophthalmos ENT: moist mucous membranes, no thyromegaly, no cervical lymphadenopathy Cardiovascular: RRR, No MRG Respiratory: CTA B Gastrointestinal: abdomen soft, NT, ND, BS+ Musculoskeletal: no deformities, strength intact in all 4 Skin: dry, warm, no rashes Neurological: no tremor with outstretched hands, DTR not elicited  ASSESSMENT: 1. DM2, insulin-dependent, uncontrolled, with complications - CKD  She has Gastroparesis (self-dx) 2/2 GB.  PLAN:  1. Patient with long-standing, uncontrolled diabetes, on oral antidiabetic regimen, which improved after starting Lantus and NovoLog with dinner, but now higher sugars with her recent illnesses (skipped med doses and ate a lot of cough drops) >> will continue current regimen, but advised he to stop the cough drops and start taking all meds as advised. - I suggested to:  Patient Instructions  Please continue: - Starlix 120 mg 3x  a day with meals - Metformin ER 500 mg 2x a day - Lantus 18 units at bedtime - NovoLog - with dinner: - 8 units before a regular meal - 10 units before a larger meal or if you have dessert  Please return in 3 months with your sugar log.   - continue checking sugars at different times of the day - check 2 times a day, rotating checks - advised for yearly eye exams >> she is due - Check HbA1c today >> 8.1% (higher) - Return to clinic in 3 mo with sugar log

## 2014-10-08 NOTE — Telephone Encounter (Signed)
Chest x-ray ordered, Pt may return to the [facility] at her convenience to have chest x-ray completed.

## 2014-10-26 ENCOUNTER — Ambulatory Visit: Payer: Medicare Other | Admitting: Adult Health

## 2014-10-29 ENCOUNTER — Encounter: Payer: Self-pay | Admitting: Internal Medicine

## 2014-11-06 ENCOUNTER — Encounter: Payer: Self-pay | Admitting: Adult Health

## 2014-11-09 ENCOUNTER — Other Ambulatory Visit: Payer: Self-pay

## 2014-11-09 DIAGNOSIS — G6181 Chronic inflammatory demyelinating polyneuritis: Secondary | ICD-10-CM

## 2014-11-09 MED ORDER — TRAMADOL HCL 50 MG PO TABS: ORAL_TABLET | ORAL | Status: DC

## 2014-11-10 ENCOUNTER — Ambulatory Visit: Payer: Medicare Other | Admitting: Adult Health

## 2014-11-27 ENCOUNTER — Other Ambulatory Visit: Payer: Self-pay | Admitting: Internal Medicine

## 2014-11-30 ENCOUNTER — Telehealth: Payer: Self-pay

## 2014-11-30 NOTE — Telephone Encounter (Signed)
Pt's 10/25 appt at 11:00 needs to be rescheduled. Called pt to r/s this appt. Please make a 30 minute appt for pt if she calls back. If she needs a sooner appt, please let me know.

## 2014-12-01 NOTE — Telephone Encounter (Signed)
Patient returned call. Appointment with Dr. Brett Fairy rescheduled to Wednesday 12/29/14 9:30am, pt advised to arrive 9:15am.

## 2014-12-07 ENCOUNTER — Ambulatory Visit: Payer: Medicare Other | Admitting: Neurology

## 2014-12-09 ENCOUNTER — Other Ambulatory Visit (HOSPITAL_COMMUNITY): Payer: Self-pay | Admitting: Urology

## 2014-12-09 ENCOUNTER — Ambulatory Visit (HOSPITAL_COMMUNITY)
Admission: RE | Admit: 2014-12-09 | Discharge: 2014-12-09 | Disposition: A | Discharge status: Home / Self Care | Payer: Medicare Other | Source: Ambulatory Visit | Attending: Urology | Admitting: Urology

## 2014-12-09 DIAGNOSIS — I10 Essential (primary) hypertension: Secondary | ICD-10-CM | POA: Insufficient documentation

## 2014-12-09 DIAGNOSIS — C642 Malignant neoplasm of left kidney, except renal pelvis: Secondary | ICD-10-CM | POA: Diagnosis not present

## 2014-12-09 DIAGNOSIS — E119 Type 2 diabetes mellitus without complications: Secondary | ICD-10-CM | POA: Insufficient documentation

## 2014-12-09 DIAGNOSIS — Z87891 Personal history of nicotine dependence: Secondary | ICD-10-CM | POA: Insufficient documentation

## 2014-12-09 DIAGNOSIS — C649 Malignant neoplasm of unspecified kidney, except renal pelvis: Secondary | ICD-10-CM

## 2014-12-09 DIAGNOSIS — Z905 Acquired absence of kidney: Secondary | ICD-10-CM | POA: Diagnosis not present

## 2014-12-09 NOTE — Telephone Encounter (Signed)
Error

## 2014-12-22 DIAGNOSIS — C678 Malignant neoplasm of overlapping sites of bladder: Secondary | ICD-10-CM | POA: Diagnosis not present

## 2014-12-22 DIAGNOSIS — C642 Malignant neoplasm of left kidney, except renal pelvis: Secondary | ICD-10-CM | POA: Diagnosis not present

## 2014-12-29 ENCOUNTER — Ambulatory Visit (INDEPENDENT_AMBULATORY_CARE_PROVIDER_SITE_OTHER): Payer: Medicare Other | Admitting: Neurology

## 2014-12-29 ENCOUNTER — Encounter: Payer: Self-pay | Admitting: Neurology

## 2014-12-29 VITALS — BP 166/86 | HR 88 | Resp 20

## 2014-12-29 DIAGNOSIS — E139 Other specified diabetes mellitus without complications: Secondary | ICD-10-CM | POA: Diagnosis not present

## 2014-12-29 DIAGNOSIS — G6181 Chronic inflammatory demyelinating polyneuritis: Secondary | ICD-10-CM | POA: Diagnosis not present

## 2014-12-29 DIAGNOSIS — R569 Unspecified convulsions: Secondary | ICD-10-CM | POA: Diagnosis not present

## 2014-12-29 DIAGNOSIS — E1340 Other specified diabetes mellitus with diabetic neuropathy, unspecified: Secondary | ICD-10-CM | POA: Insufficient documentation

## 2014-12-29 DIAGNOSIS — G6189 Other inflammatory polyneuropathies: Secondary | ICD-10-CM | POA: Diagnosis not present

## 2014-12-29 MED ORDER — TRAMADOL HCL 50 MG PO TABS: ORAL_TABLET | ORAL | Status: DC

## 2014-12-29 MED ORDER — AMITRIPTYLINE HCL 10 MG PO TABS: 40.0000 mg | ORAL_TABLET | Freq: Every day | ORAL | Status: DC

## 2014-12-29 MED ORDER — GABAPENTIN 300 MG PO CAPS: ORAL_CAPSULE | ORAL | Status: DC

## 2014-12-29 NOTE — Addendum Note (Signed)
Addended by: [name] on: 12/29/2014 10:34 AM   Modules accepted: Orders

## 2014-12-29 NOTE — Progress Notes (Signed)
Provider:  [name], M D  Referring Provider: Rosalita Chessman, DO Primary Care Physician:  Garnet Koyanagi, DO  Chief Complaint  Patient presents with  . Follow-up    neuropathy, has had "seizure" like episodes, felt an "electrical shock", rm 11, alone    HPI:  Nicole Strong is a 64 y.o. female , seen here as a  revisit from Dr. 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  In 2015 Nicole Strong underwent an EMG with nerve conduction study which I basically order to qualify her for IV Ig therapy. There were other obstacles to IVIG as she has only one kidney. However the nerve conduction study and EMG performed by Dr. Jannifer Franklin documented axonal peripheral nerve injury and not demyelinating. 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 She seems to have had the axonal form of Guillain-Barr which has a very slow recovery rate at often incomplete recovery. It is also more prone to have pain involvement. IVIG was for this reason not longer considered a feasible treatment option. At this time there is no specific treatment for axonal neuropathies except moderate diet moderate exercise lifestyle changes that will promote healthy nerves. 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 Follow for diabetes hopefully also will improve her overall neurologic health. She continues to gain weight on insulin which is common. She has struggled to visit the Guillain-Barr syndrome and aftermath now for over 4 years and had excessive weight gain due to inactivity.  Interval history 12-29-14, The patient reports stabilized but not improving ambulation. She is able to ambulate through her small apartment , but sits in her wheelchair for some manual activities. She lives alone.  The patient reminded me that she suffered a head injury in  2004 she ended up being disabled for on short-term disability and was finally laid off her job. She worked at [employer]. The job was emotionally and physically stressful and depended on her productivity is beating fast and quick responding. 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   Review of Systems: Out of a complete 14 system review, the patient complains of only the following symptoms, and all other reviewed systems are negative. Pain, weight gain, diabetes,   Social History   Social History  . Marital Status: Divorced    Spouse Name: N/A  . Number of Children: 1  . Years of Education: 14   Occupational History  .      disabled   Social History Main Topics  . Smoking status: Former Research scientist (life sciences)  . Smokeless tobacco: Never Used  . Alcohol Use: No  . Drug Use: No  . Sexual Activity: Not on file   Other Topics Concern  . Not on file   Social History Narrative   Patient lives at home alone.    Disabled.   Education college education.   Right handed.   Caffeine. Rare one cup.    Family History  Problem Relation Age of Onset  . Cancer Father     PROSTATE  . Cancer Sister     BREAST  . Healthy Mother     Past Medical History  Diagnosis Date  . Diabetes mellitus     2  . Hyperlipidemia   . Hypertension   . Renal mass     BEING WORKED UP BY UROLOGY  .  Chronic inflammatory demyelinating polyneuritis ([place])   . Guillain-Barre syndrome ([place])   . Renal disorder     L kidney removed  . Cancer ([place])   . 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 Cesarean section    . Cholecystectomy    . Nephrectomy      Current Outpatient Prescriptions  Medication Sig Dispense Refill  . albuterol (VENTOLIN HFA) 108 (90 BASE) MCG/ACT inhaler Inhale 2 puffs into the lungs every 6 (six) hours as needed for wheezing or shortness of breath. 1 Inhaler 1  . amitriptyline (ELAVIL) 10 MG tablet Take 4  tablets (40 mg total) by mouth at bedtime. 120 tablet 5  . aspirin EC 81 MG tablet Take 81 mg by mouth daily.    Marland Kitchen atorvastatin (LIPITOR) 10 MG tablet TAKE 1 TABLET BY MOUTH 3 TIMES PER WEEK 12 tablet 5  . calcium citrate (CALCITRATE - DOSED IN MG ELEMENTAL CALCIUM) 950 MG tablet Take 200 mg of elemental calcium by mouth daily.    . Cholecalciferol (VITAMIN D3) 5000 UNITS CAPS Take 5,000 Units by mouth.    . COD LIVER OIL PO Take 1 capsule by mouth daily.    Marland Kitchen gabapentin (NEURONTIN) 300 MG capsule The patient can take 1 capsule of 3oo mg in the morning, 2 capsules at lunch 1 capsule in the afternoon and 2 capsules at bedtime. TOTAL 1800 mg (Patient taking differently: The patient can take 1 capsule of 300 mg in the morning, 2 capsules (600 mg) at lunch,  1 capsule (300 mg)  in the afternoon,  and 2 capsules (600 mg)   at bedtime. TOTAL 1800 mg) 180 capsule 5  . glucose blood (FREESTYLE LITE) test strip Use to test blood sugar 2 times daily as instructed. Dx code: E11.29 100 each 11  . Insulin Glargine (LANTUS SOLOSTAR) 100 UNIT/ML Solostar Pen Inject 18 Units into the skin daily at 10 pm. 5 pen 2  . insulin lispro (HUMALOG KWIKPEN) 100 UNIT/ML KiwkPen Inject 0.08-0.1 mLs (8-10 Units total) into the skin daily with supper. (Patient taking differently: Inject 8-10 Units into the skin 3 (three) times daily with meals. ) 15 mL 2  . Insulin Pen Needle 32G X 4 MM MISC Use 1x a day 100 each 2  . lisinopril (PRINIVIL,ZESTRIL) 20 MG tablet Take 20 mg by mouth at bedtime.     . metFORMIN (GLUCOPHAGE-XR) 500 MG 24 hr tablet TAKE 1 TABLET (500 MG TOTAL) BY MOUTH 2 (TWO) TIMES DAILY. 60 tablet 1  . oxyCODONE-acetaminophen (PERCOCET/ROXICET) 5-325 MG per tablet Take 1 tablet by mouth every 4 (four) hours as needed. 15 tablet 0  . promethazine (PHENERGAN) 25 MG tablet TAKE 1 TABLET (25 MG TOTAL) BY MOUTH AS NEEDED AS DIRECTED BY DR Brett Fairy  FOR NAUSEA. 90 tablet 3  . traMADol (ULTRAM) 50 MG tablet Take 1 tablet in  the morning and 2 tablets at night 90 tablet 3  . Suvorexant (BELSOMRA) 15 MG TABS Take 15 mg by mouth Nightly. (Patient not taking: Reported on 12/29/2014) 10 tablet 0   No current facility-administered medications for this visit.    Allergies as of 12/29/2014 - Review Complete 12/29/2014  Allergen Reaction Noted  . Biaxin [clarithromycin] Other (See Comments) 05/31/2012  . Codeine Nausea And Vomiting and Other (See Comments) 10/19/2009  . Erythromycin Nausea And Vomiting 06/17/2012  . Januvia [sitagliptin]  06/22/2014  . Prednisone  04/06/2014    Vitals: BP 166/86 mmHg  Pulse 88  Resp 20 Last Weight:  Wt Readings from Last 1 Encounters:  08/18/14 213 lb 8 oz (96.843 kg)   Last Height:   Ht Readings from Last 1 Encounters:  08/18/14 5\' 1"  (1.549 m)    Physical exam:  General: The patient is awake, alert and appears not in acute distress. The patient is well groomed. Head: Normocephalic, atraumatic.  Neck is short but supple. Mallampati 3 , neck circumference: 15.5 "  Cardiovascular:  Regular rate and rhythm , without  murmurs or carotid bruit, and without distended neck veins. Respiratory: Lungs are clear to auscultation. Skin:  Without evidence of edema, , she has rosacea. Skin changes visible over the lower arms and blotches.  Low skin turgor.  Trunk: BMI is severly elevated.  Neurologic exam : The patient is awake and alert, oriented to place and time.  Memory subjective described as intact.  There is a normal attention span & concentration ability.  Speech is fluent without dysarthria, dysphonia or aphasia. Mood and affect are appropriate.  Cranial nerves: Pupils are equal and briskly reactive to light.   Extraocular movements  in vertical and horizontal planes intact and without nystagmus.  Visual fields by finger perimetry are intact. Hearing to finger rub intact.  Facial sensation intact to fine touch. Facial motor strength is symmetric and tongue and uvula move  midline. Tongue protrusion into either cheek is normal. Shoulder shrug is weak-  Hip flexion is very weak.    Motor exam:  Decreased muscle tone in all extremities, loss of mass. Her grip strength is weak ,  she cannot maintain a strong grip. She continues to have spinal and lower back pain and most of her strength loss is evident from the navel down involving both lower extremities.  This is commonly a pattern of Guillain-Barr.   I was able to elicit deep tendon reflexes in her upper extremities. These are not preserved in her lower extremities. Sensory:  Fine touch, pinprick and vibration were tested in all extremities- sensory loss covered by hyperesthesia. Proprioception ab-normal.   Coordination: Rapid alternating movements in the fingers/hands were normal.  Finger-to-nose maneuver  normal without evidence of  tremor.  Gait and station: Patient in wheelchair, able to stand only with assistance.   She can walk with a walker on an even surface. She cannot manage an incline. Strength abnormal,  limits. Stance is instable  Tandem gait deferred.   Deep tendon reflexes: in the upper and lower extremities are symmetric and intact. Babinski maneuver response is downgoing.  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 Dr. Doy Mince followed. The patient never recovered fully, but has not longer progressed. Marland KitchenSpinal tap had been performed in the hospital but EMG and nerve conduction studies were not performed until earlier this year.   Seizure like activity : 3 spells in the same night. Loss of awareness was not part of it. She described a wave coming over her. She describes this as if being electrified from the feet up. She felt like being electrocuted. Her hands shook . His occurred while in bed  and reclined. The patient describes also that after the electric shock sensation she would have abrupt and uncoordinated movements and jarred  Stepping when walking , her speech had  changed. But she did not think she lost consciousness. Spinal ? Seizure?   Plan:  Treatment plan and additional workup : Axonal polyneuropathy with ascending pattern, subtype of Guillain-Barr, not qualifying as CIDP and therefore not treatable with IVIG. Main management is physical therapy diet adjustment, and pain therapy as needed.  Seizure medications are already in place , mainly to treat the neuropathic pain and back pain. Neurontin ,      [name] MD 12/29/2014

## 2014-12-29 NOTE — Patient Instructions (Addendum)
Fall Prevention in the Home  Falls can cause injuries and can affect people from all age groups. There are many simple things that you can do to make your home safe and to help prevent falls. WHAT CAN I DO ON THE OUTSIDE OF MY HOME?  Regularly repair the edges of walkways and driveways and fix any cracks.  Remove high doorway thresholds.  Trim any shrubbery on the main path into your home.  Use bright outdoor lighting.  Clear walkways of debris and clutter, including tools and rocks.  Regularly check that handrails are securely fastened and in good repair. Both sides of any steps should have handrails.  Install guardrails along the edges of any raised decks or porches.  Have leaves, snow, and ice cleared regularly.  Use sand or salt on walkways during winter months.  In the garage, clean up any spills right away, including grease or oil spills. WHAT CAN I DO IN THE BATHROOM?  Use night lights.  Install grab bars by the toilet and in the tub and shower. Do not use towel bars as grab bars.  Use non-skid mats or decals on the floor of the tub or shower.  If you need to sit down while you are in the shower, use a plastic, non-slip stool..  Keep the floor dry. Immediately clean up any water that spills on the floor.  Remove soap buildup in the tub or shower on a regular basis.  Attach bath mats securely with double-sided non-slip rug tape.  Remove throw rugs and other tripping hazards from the floor. WHAT CAN I DO IN THE BEDROOM?  Use night lights.  Make sure that a bedside light is easy to reach.  Do not use oversized bedding that drapes onto the floor.  Have a firm chair that has side arms to use for getting dressed.  Remove throw rugs and other tripping hazards from the floor. WHAT CAN I DO IN THE KITCHEN?   Clean up any spills right away.  Avoid walking on wet floors.  Place frequently used items in easy-to-reach places.  If you need to reach for something  above you, use a sturdy step stool that has a grab bar.  Keep electrical cables out of the way.  Do not use floor polish or wax that makes floors slippery. If you have to use wax, make sure that it is non-skid floor wax.  Remove throw rugs and other tripping hazards from the floor. WHAT CAN I DO IN THE STAIRWAYS?  Do not leave any items on the stairs.  Make sure that there are handrails on both sides of the stairs. Fix handrails that are broken or loose. Make sure that handrails are as long as the stairways.  Check any carpeting to make sure that it is firmly attached to the stairs. Fix any carpet that is loose or worn.  Avoid having throw rugs at the top or bottom of stairways, or secure the rugs with carpet tape to prevent them from moving.  Make sure that you have a light switch at the top of the stairs and the bottom of the stairs. If you do not have them, have them installed. WHAT ARE SOME OTHER FALL PREVENTION TIPS?  Wear closed-toe shoes that fit well and support your feet. Wear shoes that have rubber soles or low heels.  When you use a stepladder, make sure that it is completely opened and that the sides are firmly locked. Have someone hold the ladder while you   are using it. Do not climb a closed stepladder.  Add color or contrast paint or tape to grab bars and handrails in your home. Place contrasting color strips on the first and last steps.  Use mobility aids as needed, such as canes, walkers, scooters, and crutches.  Turn on lights if it is dark. Replace any light bulbs that burn out.  Set up furniture so that there are clear paths. Keep the furniture in the same spot.  Fix any uneven floor surfaces.  Choose a carpet design that does not hide the edge of steps of a stairway.  Be aware of any and all pets.  Review your medicines with your healthcare provider. Some medicines can cause dizziness or changes in blood pressure, which increase your risk of falling. Talk  with your health care provider about other ways that you can decrease your risk of falls. This may include working with a physical therapist or trainer to improve your strength, balance, and endurance.   This information is not intended to replace advice given to you by your health care provider. Make sure you discuss any questions you have with your health care provider.   Document Released: 01/19/2002 Document Revised: 06/15/2014 Document Reviewed: 03/05/2014 Elsevier Interactive Patient Education 2016 [place] in 6 month.

## 2014-12-30 ENCOUNTER — Telehealth: Payer: Self-pay

## 2014-12-30 LAB — COMPREHENSIVE METABOLIC PANEL
ALBUMIN: 3.6 g/dL (ref 3.6–4.8)
ALT: 12 IU/L (ref 0–32)
AST: 9 IU/L (ref 0–40)
Albumin/Globulin Ratio: 1.7 (ref 1.1–2.5)
Alkaline Phosphatase: 99 IU/L (ref 39–117)
BUN/Creatinine Ratio: 18 (ref 11–26)
BUN: 20 mg/dL (ref 8–27)
Bilirubin Total: 0.4 mg/dL (ref 0.0–1.2)
CALCIUM: 9.6 mg/dL (ref 8.7–10.3)
CO2: 25 mmol/L (ref 18–29)
CREATININE: 1.09 mg/dL — AB (ref 0.57–1.00)
Chloride: 99 mmol/L (ref 97–106)
GFR calc Af Amer: 61 mL/min/{1.73_m2} (ref >59)
GFR, EST NON AFRICAN AMERICAN: 53 mL/min/{1.73_m2} — AB (ref >59)
GLOBULIN, TOTAL: 2.1 g/dL (ref 1.5–4.5)
Glucose: 284 mg/dL — ABNORMAL HIGH (ref 65–99)
Potassium: 5 mmol/L (ref 3.5–5.2)
SODIUM: 138 mmol/L (ref 136–144)
Total Protein: 5.7 g/dL — ABNORMAL LOW (ref 6.0–8.5)

## 2014-12-30 NOTE — Telephone Encounter (Signed)
Advised pt that her glucose was high, pt states that she had not taken her insulin prior to her blood draw. I advised pt that her creatinine and GFR looked better that they did 8 months ago. I advised her that her liver function was normal. Pt verbalized understanding.

## 2014-12-30 NOTE — Telephone Encounter (Signed)
-----   Message from [name], MD sent at 12/30/2014  4:39 PM EST ----- Mrs. Coldren's blood glucose level was  high at 284, this may be related to food intake prior to blood draw . Her creatinine level has come down to 1.09 closer to normal, her glomerular filtration rate has increased to 53 ml.  Normal liver function.

## 2015-01-12 ENCOUNTER — Ambulatory Visit: Payer: Medicare Other | Admitting: Internal Medicine

## 2015-01-25 ENCOUNTER — Encounter: Payer: Self-pay | Admitting: Family Medicine

## 2015-01-27 ENCOUNTER — Ambulatory Visit (HOSPITAL_BASED_OUTPATIENT_CLINIC_OR_DEPARTMENT_OTHER)
Admission: RE | Admit: 2015-01-27 | Discharge: 2015-01-27 | Disposition: A | Discharge status: Home / Self Care | Payer: Medicare Other | Source: Ambulatory Visit | Attending: Medical | Admitting: Medical

## 2015-01-27 ENCOUNTER — Ambulatory Visit (INDEPENDENT_AMBULATORY_CARE_PROVIDER_SITE_OTHER): Payer: Medicare Other | Admitting: Medical

## 2015-01-27 ENCOUNTER — Encounter: Payer: Self-pay | Admitting: Medical

## 2015-01-27 VITALS — BP 130/88 | HR 89 | Temp 99.1°F | Ht 61.0 in

## 2015-01-27 DIAGNOSIS — J029 Acute pharyngitis, unspecified: Secondary | ICD-10-CM

## 2015-01-27 DIAGNOSIS — R05 Cough: Secondary | ICD-10-CM | POA: Insufficient documentation

## 2015-01-27 DIAGNOSIS — J209 Acute bronchitis, unspecified: Secondary | ICD-10-CM

## 2015-01-27 LAB — POCT RAPID STREP A (OFFICE): RAPID STREP A SCREEN: NEGATIVE

## 2015-01-27 MED ORDER — IPRATROPIUM BROMIDE HFA 17 MCG/ACT IN AERS
2.0000 | INHALATION_SPRAY | Freq: Four times a day (QID) | RESPIRATORY_TRACT | Status: AC | PRN
Start: 2015-01-27 — End: ?

## 2015-01-27 MED ORDER — AZELASTINE HCL 0.1 % NA SOLN: 2.0000 | Freq: Two times a day (BID) | NASAL | Status: AC

## 2015-01-27 MED ORDER — DOXYCYCLINE HYCLATE 100 MG PO TABS: 100.0000 mg | ORAL_TABLET | Freq: Two times a day (BID) | ORAL | Status: DC

## 2015-01-27 NOTE — Progress Notes (Signed)
Pre visit review using our clinic review tool, if applicable. No additional management support is needed unless otherwise documented below in the visit note. 

## 2015-01-27 NOTE — Progress Notes (Signed)
Subjective:    Patient ID: Nicole Strong, female    DOB: 1950-09-08, 64 y.o.   MRN: [id number]  HPI  Pt in with sore throat since Saturday. Worse sore throat she ever had. Chest congestion and productive cough. Nasal congestion but no sinus pressure. Pt does get bronchitis in the past.(but not chronically).  Pt hs been wheezing some. Last time she was in she was given an inhaler.    Review of Systems  HENT: Positive for congestion and sore throat. Negative for ear pain, nosebleeds and postnasal drip.   Respiratory: Positive for cough and wheezing. Negative for shortness of breath.   Cardiovascular: Negative for chest pain and palpitations.  Gastrointestinal: Negative for abdominal pain.  Musculoskeletal: Negative for back pain.  Skin: Negative for rash.  Neurological: Negative for dizziness and headaches.  Hematological: Negative for adenopathy. Does not bruise/bleed easily.  Psychiatric/Behavioral: Negative for behavioral problems and confusion.    Past Medical History  Diagnosis Date  . Diabetes mellitus     2  . Hyperlipidemia   . Hypertension   . Renal mass     BEING WORKED UP BY UROLOGY  . 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 Renal disorder     L kidney removed  . Cancer ([place])   . 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 Marital Status: Divorced    Spouse Name: N/A  . Number of Children: 1  . Years of Education: 14   Occupational History  .      disabled   Social History Main Topics  . Smoking status: Former Research scientist (life sciences)  . Smokeless tobacco: Never Used  . Alcohol Use: No  . Drug Use: No  . Sexual Activity: Not on file   Other Topics Concern  . Not on file   Social History Narrative   Patient lives at home alone.    Disabled.   Education college education.   Right handed.   Caffeine. Rare one cup.    Past Surgical History  Procedure Laterality  Date  . Cesarean section    . Cholecystectomy    . Nephrectomy      Family History  Problem Relation Age of Onset  . Cancer Father     PROSTATE  . Cancer Sister     BREAST  . Healthy Mother     Allergies  Allergen Reactions  . Biaxin [Clarithromycin] Other (See Comments)    "face turns red"  . Codeine Nausea And Vomiting and Other (See Comments)    Hallucinations  . Erythromycin Nausea And Vomiting  . Januvia [Sitagliptin]   . Prednisone     Mania.    Current Outpatient Prescriptions on File Prior to Visit  Medication Sig Dispense Refill  . albuterol (VENTOLIN HFA) 108 (90 BASE) MCG/ACT inhaler Inhale 2 puffs into the lungs every 6 (six) hours as needed for wheezing or shortness of breath. 1 Inhaler 1  . amitriptyline (ELAVIL) 10 MG tablet Take 4 tablets (40 mg total) by mouth at bedtime. 120 tablet 5  . aspirin EC 81 MG tablet Take 81 mg by mouth daily.    Marland Kitchen atorvastatin (LIPITOR) 10 MG tablet TAKE 1 TABLET BY MOUTH 3 TIMES PER WEEK 12 tablet 5  . calcium citrate (CALCITRATE - DOSED IN MG ELEMENTAL CALCIUM) 950 MG tablet Take 200 mg of elemental calcium by mouth daily.    Marland Kitchen  Cholecalciferol (VITAMIN D3) 5000 UNITS CAPS Take 5,000 Units by mouth.    . COD LIVER OIL PO Take 1 capsule by mouth daily.    Marland Kitchen gabapentin (NEURONTIN) 300 MG capsule The patient can take 1 capsule of 300 mg in the morning, 2 capsules (600 mg) at lunch,  1 capsule (300 mg)  in the afternoon,  and 2 capsules (600 mg)   at bedtime. TOTAL 1800 mg 180 capsule 5  . glucose blood (FREESTYLE LITE) test strip Use to test blood sugar 2 times daily as instructed. Dx code: E11.29 100 each 11  . Insulin Glargine (LANTUS SOLOSTAR) 100 UNIT/ML Solostar Pen Inject 18 Units into the skin daily at 10 pm. 5 pen 2  . insulin lispro (HUMALOG KWIKPEN) 100 UNIT/ML KiwkPen Inject 0.08-0.1 mLs (8-10 Units total) into the skin daily with supper. (Patient taking differently: Inject 8-10 Units into the skin 3 (three) times daily  with meals. ) 15 mL 2  . Insulin Pen Needle 32G X 4 MM MISC Use 1x a day 100 each 2  . lisinopril (PRINIVIL,ZESTRIL) 20 MG tablet Take 20 mg by mouth at bedtime.     . metFORMIN (GLUCOPHAGE-XR) 500 MG 24 hr tablet TAKE 1 TABLET (500 MG TOTAL) BY MOUTH 2 (TWO) TIMES DAILY. 60 tablet 1  . oxyCODONE-acetaminophen (PERCOCET/ROXICET) 5-325 MG per tablet Take 1 tablet by mouth every 4 (four) hours as needed. 15 tablet 0  . promethazine (PHENERGAN) 25 MG tablet TAKE 1 TABLET (25 MG TOTAL) BY MOUTH AS NEEDED AS DIRECTED BY DR Brett Fairy  FOR NAUSEA. 90 tablet 3  . traMADol (ULTRAM) 50 MG tablet Take 1 tablet in the morning and 2 tablets at night 90 tablet 3   No current facility-administered medications on file prior to visit.    BP 130/88 mmHg  Pulse 89  Temp(Src) 99.1 F (37.3 C) (Oral)  Ht 5\' 1"  (1.549 m)  Wt   SpO2 98%       Objective:   Physical Exam  General  Mental Status - Alert. General Appearance - Well groomed. Not in acute distress.  Skin Rashes- No Rashes.  HEENT Head- Normal. Ear Auditory Canal - Left- Normal. Right - Normal.Tympanic Membrane- Left- Normal. Right- Normal. Eye Sclera/Conjunctiva- Left- Normal. Right- Normal. Nose & Sinuses Nasal Mucosa- Left-  Boggy and Congested. Right-  Boggy and  Congested.Bilateral no maxillary and  No frontal sinus pressure. Mouth & Throat Lips: Upper Lip- Normal: no dryness, cracking, pallor, cyanosis, or vesicular eruption. Lower Lip-Normal: no dryness, cracking, pallor, cyanosis or vesicular eruption. Buccal Mucosa- Bilateral- No Aphthous ulcers. Oropharynx- No Discharge or Erythema. +pnd Tonsils: Characteristics- Bilateral- No Erythema or Congestion. Size/Enlargement- Bilateral- No enlargement. Discharge- bilateral-None.  Neck Neck- Supple. No Masses.   Chest and Lung Exam Auscultation: Breath Sounds:-Clear even and unlabored. Only faint upper lobe rhonchi  Cardiovascular Auscultation:Rythm- Regular, rate and  rhythm. Murmurs & Other Heart Sounds:Ausculatation of the heart reveal- No Murmurs.  Lymphatic Head & Neck General Head & Neck Lymphatics: Bilateral: Description- No Localized lymphadenopathy.       Assessment & Plan:  For bronchitis will rx doxycycline antibiotic.  For cough will rx benzonatate.(you have at home)  For nasal congestion astelin(if allergy component)  Your strep test was negative.  For your recent wheezing continue albuterol. If using on regular basis then start atrovent  Atrovent and astelin used due to steroid listed as allergy.

## 2015-01-27 NOTE — Addendum Note (Signed)
Addended by: Tasia Catchings on: 01/27/2015 03:05 PM   Modules accepted: Orders

## 2015-01-27 NOTE — Patient Instructions (Addendum)
For bronchitis will rx doxycycline antibiotic.(rx advisment given for antibiotic)  For cough recommend benzonatate.(you have at home)  For nasal congestion could try astelin.(if allergy component)  Your strep test was negative.  For your recent wheezing continue albuterol. If using on regular basis then start atrovent Will go ahead and get cxr since you have some transportation issues.   Atrovent and astelin used due to steroid listed as allergy.  Follow up in 7 days or as needed

## 2015-01-28 ENCOUNTER — Telehealth: Payer: Self-pay | Admitting: *Deleted

## 2015-01-28 NOTE — Telephone Encounter (Signed)
PA approved effective 01/28/2015 through 01/28/2016.

## 2015-01-28 NOTE — Telephone Encounter (Signed)
PA submitted through [employer]. OV notes faxed to [id number] successfully. Awaiting determination. JG//CMA

## 2015-02-01 ENCOUNTER — Other Ambulatory Visit: Payer: Self-pay | Admitting: *Deleted

## 2015-02-01 MED ORDER — INSULIN GLARGINE 100 UNIT/ML SOLOSTAR PEN: 18.0000 [IU] | PEN_INJECTOR | Freq: Every day | SUBCUTANEOUS | Status: DC

## 2015-02-14 ENCOUNTER — Encounter: Payer: Self-pay | Admitting: Internal Medicine

## 2015-02-24 ENCOUNTER — Encounter: Payer: Self-pay | Admitting: Internal Medicine

## 2015-02-24 ENCOUNTER — Other Ambulatory Visit: Payer: Self-pay | Admitting: *Deleted

## 2015-02-25 ENCOUNTER — Other Ambulatory Visit: Payer: Self-pay | Admitting: *Deleted

## 2015-02-25 ENCOUNTER — Ambulatory Visit (INDEPENDENT_AMBULATORY_CARE_PROVIDER_SITE_OTHER): Payer: Medicare Other | Admitting: Neurology

## 2015-02-25 ENCOUNTER — Telehealth: Payer: Self-pay

## 2015-02-25 ENCOUNTER — Encounter: Payer: Self-pay | Admitting: Family Medicine

## 2015-02-25 ENCOUNTER — Ambulatory Visit (INDEPENDENT_AMBULATORY_CARE_PROVIDER_SITE_OTHER): Payer: Medicare Other | Admitting: Family Medicine

## 2015-02-25 VITALS — BP 130/80 | HR 80 | Temp 99.9°F | Wt 204.0 lb

## 2015-02-25 DIAGNOSIS — E1340 Other specified diabetes mellitus with diabetic neuropathy, unspecified: Secondary | ICD-10-CM

## 2015-02-25 DIAGNOSIS — G6189 Other inflammatory polyneuropathies: Secondary | ICD-10-CM

## 2015-02-25 DIAGNOSIS — T300 Burn of unspecified body region, unspecified degree: Secondary | ICD-10-CM | POA: Diagnosis not present

## 2015-02-25 DIAGNOSIS — E139 Other specified diabetes mellitus without complications: Secondary | ICD-10-CM

## 2015-02-25 DIAGNOSIS — G6181 Chronic inflammatory demyelinating polyneuritis: Secondary | ICD-10-CM

## 2015-02-25 DIAGNOSIS — R21 Rash and other nonspecific skin eruption: Secondary | ICD-10-CM

## 2015-02-25 DIAGNOSIS — R569 Unspecified convulsions: Secondary | ICD-10-CM | POA: Diagnosis not present

## 2015-02-25 MED ORDER — SILVER SULFADIAZINE 1 % EX CREA: 1.0000 "application " | TOPICAL_CREAM | Freq: Every day | CUTANEOUS | Status: DC

## 2015-02-25 MED ORDER — MOMETASONE FUROATE 0.1 % EX CREA: 1.0000 "application " | TOPICAL_CREAM | Freq: Every day | CUTANEOUS | Status: DC

## 2015-02-25 NOTE — Progress Notes (Signed)
Pre visit review using our clinic review tool, if applicable. No additional management support is needed unless otherwise documented below in the visit note. 

## 2015-02-25 NOTE — Telephone Encounter (Signed)
Called pt per Dr. Jannifer Franklin request to advise pt that her EEG was normal. I advised pt of this and she verbalized understanding.

## 2015-02-25 NOTE — Telephone Encounter (Signed)
Open encounter in error..
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
## 2015-02-25 NOTE — Procedures (Signed)
    History:  Nicole Strong is a 65 year old patient with a history of Guillain-Barr syndrome. The patient has had 3 episodes of shocklike sensations, and sensation of uncoordinated movements of the extremities. There was no loss of consciousness. The patient is being evaluated for these events.  This is a routine EEG. No skull defects are noted. Medications include albuterol, amitriptyline, aspirin, Lipitor, vitamin D supplementation, gabapentin, insulin, lisinopril, metformin, oxycodone, Phenergan, Ultram, and Belsomra.   EEG classification: Normal awake  Description of the recording: The background rhythms of this recording consists of a fairly well modulated medium amplitude alpha rhythm of 9 Hz that is reactive to eye opening and closure. As the record progresses, the patient appears to remain in the waking state throughout the recording. Photic stimulation was performed, resulting in a bilateral and symmetric photic driving response. Hyperventilation was also performed, resulting in a minimal buildup of the background rhythm activities without significant slowing seen. At no time during the recording does there appear to be evidence of spike or spike wave discharges or evidence of focal slowing. EKG monitor shows no evidence of cardiac rhythm abnormalities with a heart rate of 72.  Impression: This is a normal EEG recording in the waking state. No evidence of ictal or interictal discharges are seen.
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
## 2015-03-07 ENCOUNTER — Ambulatory Visit (INDEPENDENT_AMBULATORY_CARE_PROVIDER_SITE_OTHER): Payer: Medicare Other | Admitting: Internal Medicine

## 2015-03-07 ENCOUNTER — Other Ambulatory Visit (INDEPENDENT_AMBULATORY_CARE_PROVIDER_SITE_OTHER): Payer: BC Managed Care – PPO | Admitting: *Deleted

## 2015-03-07 ENCOUNTER — Encounter: Payer: Self-pay | Admitting: Internal Medicine

## 2015-03-07 VITALS — BP 120/72 | HR 78 | Temp 98.5°F | Resp 12

## 2015-03-07 DIAGNOSIS — E1165 Type 2 diabetes mellitus with hyperglycemia: Secondary | ICD-10-CM

## 2015-03-07 DIAGNOSIS — E1122 Type 2 diabetes mellitus with diabetic chronic kidney disease: Secondary | ICD-10-CM | POA: Diagnosis not present

## 2015-03-07 DIAGNOSIS — N182 Chronic kidney disease, stage 2 (mild): Secondary | ICD-10-CM

## 2015-03-07 DIAGNOSIS — IMO0002 Reserved for concepts with insufficient information to code with codable children: Secondary | ICD-10-CM

## 2015-03-07 DIAGNOSIS — Z794 Long term (current) use of insulin: Secondary | ICD-10-CM

## 2015-03-07 LAB — POCT GLYCOSYLATED HEMOGLOBIN (HGB A1C): HEMOGLOBIN A1C: 9

## 2015-03-07 MED ORDER — INSULIN GLARGINE 100 UNIT/ML SOLOSTAR PEN: 22.0000 [IU] | PEN_INJECTOR | Freq: Every day | SUBCUTANEOUS | Status: DC

## 2015-03-07 MED ORDER — INSULIN LISPRO 100 UNIT/ML (KWIKPEN): 10.0000 [IU] | PEN_INJECTOR | Freq: Three times a day (TID) | SUBCUTANEOUS | Status: DC

## 2015-03-07 NOTE — Progress Notes (Signed)
Patient ID: Nicole Strong, female   DOB: Apr 21, 1950, 65 y.o.   MRN: [id number]  HPI: Nicole Strong is a 65 y.o.-year-old female, returning for f/u for DM2, dx in ~2009, insulin-dependent, uncontrolled, with complications (CKD). Last visit 5 mo ago.  She had high sugars and weight gain over the Holidays. She also had pbs with cost of the meds and had to stop the Humalog for a month since last visit. She now changed insurances >> picked Humalog 2 days before the visit. Now does not have a copay for Humalog and 7$ for Lantus. She was also off Metformin.   Last hemoglobin A1c was: Lab Results  Component Value Date   HGBA1C 8.1 10/08/2014   HGBA1C 7.6* 07/08/2014   HGBA1C 11.1* 04/09/2014   Pt is on a regimen of: - Metformin ER 500 mg 2x a day - Lantus 18 units at bedtime - NovoLog - with dinner: - 8 units before a regular meal - 10 units before a larger meal or if you have dessert She tried regular Metformin >> diarrhea >> had to stop it She tried Januvia >> had diarrhea from it. She tried Tradjenta >> could not afford it, also blocked the abs of her other meds. She tried Wechol 1000 mg po bid >> could not afford it She tried Actos >> sugars better, but had bladder cancer We stopped Starlix 120 mg 3x a day with meals.  Pt checks her sugars 1x a day and they are - am: 200s. 300 this am (Glucerna last night) >> 130-180, 205 >> 136-185 >> 100-140s >> <200 - 2h after b'fast: n/c >> 181-205 >> 173-219 >> n/c >> 200-260 - before lunch: 200s >> n/c >> 180 >> n/c - 2h after lunch: n/c >> n/c - before dinner: 200s >> 121-199 >> 106x1, 186-215 >> n/c >> 200-230 - 2h after dinner: n/c >> 158-205 >> 188, 204 >> n/c - bedtime: n/c >> 192-312 >> 180-213, 283 >> 180-198 >> 200-300 - nighttime: n/c >> 121-208 >> 149-186 No lows. Lowest sugar was 200 >> 130 >> 103 >> 107; she has hypoglycemia awareness at 200.  Highest sugar was >600 - ED visit >> 300s >> 286 x1 >> low 200 few times >>  300s  Glucometer: Freestyle Lite  Pt's meals are: - Breakfast: glucerna, may skip, b/c gastroparesis - Lunch >> mostly brunch: 1/2 sandwich - Dinner: salad or TV dinner or egg + English muffin - Snacks: cheese, PB crackers No sodas  - + CKD, last BUN/creatinine:  Lab Results  Component Value Date   BUN 20 12/29/2014   CREATININE 1.09* 12/29/2014  She had L nephrectomy 2011. - last set of lipids: Lab Results  Component Value Date   CHOL 127 04/29/2014   HDL 53.10 04/29/2014   LDLCALC 46 04/29/2014   LDLDIRECT 210.4 12/31/2013   TRIG 141.0 04/29/2014   CHOLHDL 2 04/29/2014  On Zocor >> mm pain >> stopped >> did not start Lipitor as suggested. - last eye exam was in 03/2013. No DR. Has double vision from GBS. - + numbness and tingling in her feet.  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 In 2011.  She had pancreatitis >> ED 03/15/2014. On 04/06/2014 >> went to ED with Hgly , CBG >600.  ROS: Constitutional: + weight gain, no fatigue, no subjective hypothermia Eyes: no blurry vision, no xerophthalmia ENT: no sore throat, no nodules palpated in throat, no dysphagia/odynophagia, no hoarseness Cardiovascular: no  CP/SOB/palpitations/leg swelling Respiratory: no cough/no SOB Gastrointestinal: no N/V/D/C/acid reflux Musculoskeletal: no muscle/no joint aches Skin: no rashes Neurological: no tremors/numbness/tingling/dizziness  I reviewed pt's medications, allergies, PMH, social hx, family hx, and changes were documented in the history of present illness. Otherwise, unchanged from my initial visit note:  Past Medical History  Diagnosis Date  . Diabetes mellitus     2  . Hyperlipidemia   . Hypertension   . Renal mass     BEING WORKED UP BY UROLOGY  . 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 Guillain-Barre syndrome ([place])   . Renal disorder     L kidney removed  . Cancer ([place])   . 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 Cesarean section    . Cholecystectomy    . Nephrectomy     History   Social History  . Marital Status: Divorced    Spouse Name: N/A    Number of Children: 1  . Years of Education: 14   Occupational History  .      disabled   Social History Main Topics  . Smoking status: Former Research scientist (life sciences)  . Smokeless tobacco: Never Used  . Alcohol Use: No  . Drug Use: No   Social History Narrative   Patient lives alone.    Disabled.   Education college education.   Right handed.   Caffeine. Rare one cup.   Current Outpatient Prescriptions on File Prior to Visit  Medication Sig Dispense Refill  . amitriptyline (ELAVIL) 10 MG tablet TAKE 4 TABS AT BEDTIME  FOR 1 WEEK THEN INCREASE TO 5 AT BEDTIME IF NECESSARY 150 tablet 3  . aspirin EC 81 MG tablet Take 81 mg by mouth daily.    . calcium citrate (CALCITRATE - DOSED IN MG ELEMENTAL CALCIUM) 950 MG tablet Take 200 mg of elemental calcium by mouth daily.    . cholecalciferol (VITAMIN D) 1000 UNITS tablet Take 5,000 Units by mouth daily.    . COD LIVER OIL PO Take 1 capsule by mouth daily.    Marland Kitchen gabapentin (NEURONTIN) 300 MG capsule TAKE 1 CAPSULE (300 MG TOTAL) BY MOUTH 4 (FOUR) TIMES DAILY. 120 capsule 3  . linagliptin (TRADJENTA) 5 MG TABS tablet Take 1 tablet (5 mg total) by mouth daily. 30 tablet 2  . lisinopril (PRINIVIL,ZESTRIL) 20 MG tablet Take 20 mg by mouth daily.    . metFORMIN (GLUCOPHAGE) 1000 MG tablet TAKE 1 TABLET (1,000 MG TOTAL) BY MOUTH 2 (TWO) TIMES DAILY WITH A MEAL. OFFICE VISIT DUE FOR MORE REFILLS. 60 tablet 0  . metoCLOPramide (REGLAN) 10 MG tablet Take 1 tablet (10 mg total) by mouth 4 (four) times daily -  before meals and at bedtime. 120 tablet 2  . nateglinide (STARLIX) 60 MG tablet Take 1 tablet (60 mg total) by mouth 3 (three) times daily with meals. 90 tablet 2  . promethazine (PHENERGAN) 25 MG tablet TAKE 1 TABLET (25 MG TOTAL) BY MOUTH AS NEEDED as directed by  dr dohmeier  FOR NAUSEA. 90 tablet 1  . simvastatin (ZOCOR) 40 MG tablet Take 1 tablet (40 mg total) by mouth at bedtime. 30 tablet 2  . traMADol (ULTRAM) 50 MG tablet Take 50-100 mg by mouth every 6 (six) hours as needed for moderate pain. Take 1 tablet in the morning and 2 tablets at night     No current facility-administered medications on file  prior to visit.   Allergies  Allergen Reactions  . Biaxin [Clarithromycin] Other (See Comments)    "face turns red"  . Codeine Nausea And Vomiting and Other (See Comments)    Hallucinations  . Erythromycin Nausea And Vomiting  . Januvia [Sitagliptin]   . Prednisone     Mania.   Family History  Problem Relation Age of Onset  . Cancer Father     PROSTATE  . Cancer Sister     BREAST  . Healthy Mother    PE: BP 120/72 mmHg  Pulse 78  Temp(Src) 98.5 F (36.9 C) (Oral)  Resp 12  SpO2 95% There is no weight on file to calculate BMI. Wt Readings from Last 3 Encounters:  02/25/15 204 lb (92.534 kg)  08/18/14 213 lb 8 oz (96.843 kg)  05/25/14 203 lb (92.08 kg)   Constitutional: overweight, in NAD, in wheelchair Eyes: PERRLA, EOMI, no exophthalmos ENT: moist mucous membranes, no thyromegaly, no cervical lymphadenopathy Cardiovascular: RRR, No MRG Respiratory: CTA B Gastrointestinal: abdomen soft, NT, ND, BS+ Musculoskeletal: no deformities, strength intact in all 4 Skin: dry, warm, no rashes Neurological: no tremor with outstretched hands, DTR not elicited  ASSESSMENT: 1. DM2, insulin-dependent, uncontrolled, with complications - CKD  She has Gastroparesis (self-dx) 2/2 GB.  PLAN:  1. Patient with long-standing, uncontrolled diabetes, on oral antidiabetic regimen, which worse control 2/2 cost of meds (now she can afford them) >> restart basal-bolus regimen (will increase doses) and Metformin. - I suggested to:  Patient Instructions  Please increase:  - Lantus to 22 units at bedtime - Humalog:  10 units with a small meal 15  units with a larger meal  Please Metformin ER 500 mg 2x a day.  Please return in 1.5 months with your sugar log.   - continue checking sugars at different times of the day - check 2-3 times a day, rotating checks - advised for yearly eye exams >> she is due - she started institute healthier changes in her diet after the Holidays - Check HbA1c today >> 9% (higher) - Return to clinic in 1.5 mo with sugar log

## 2015-03-07 NOTE — Patient Instructions (Signed)
Please increase:  - Lantus to 22 units at bedtime - Humalog:  10 units with a small meal 15 units with a larger meal  Please Metformin ER 500 mg 2x a day.  Please return in 1.5 months with your sugar log.

## 2015-03-22 ENCOUNTER — Other Ambulatory Visit: Payer: Self-pay | Admitting: Internal Medicine

## 2015-03-23 ENCOUNTER — Other Ambulatory Visit: Payer: Self-pay | Admitting: Internal Medicine

## 2015-03-28 ENCOUNTER — Other Ambulatory Visit: Payer: Self-pay | Admitting: *Deleted

## 2015-03-28 MED ORDER — INSULIN LISPRO 100 UNIT/ML (KWIKPEN): 10.0000 [IU] | PEN_INJECTOR | Freq: Three times a day (TID) | SUBCUTANEOUS | Status: DC

## 2015-03-28 MED ORDER — INSULIN GLARGINE 100 UNIT/ML SOLOSTAR PEN: 22.0000 [IU] | PEN_INJECTOR | Freq: Every day | SUBCUTANEOUS | Status: DC

## 2015-04-21 ENCOUNTER — Encounter: Payer: Self-pay | Admitting: Internal Medicine

## 2015-04-21 ENCOUNTER — Ambulatory Visit (INDEPENDENT_AMBULATORY_CARE_PROVIDER_SITE_OTHER): Payer: Medicare Other | Admitting: Internal Medicine

## 2015-04-21 VITALS — BP 122/80 | HR 81 | Temp 98.2°F | Resp 12 | Wt 215.0 lb

## 2015-04-21 DIAGNOSIS — E1122 Type 2 diabetes mellitus with diabetic chronic kidney disease: Secondary | ICD-10-CM | POA: Diagnosis not present

## 2015-04-21 DIAGNOSIS — E1165 Type 2 diabetes mellitus with hyperglycemia: Secondary | ICD-10-CM

## 2015-04-21 DIAGNOSIS — IMO0002 Reserved for concepts with insufficient information to code with codable children: Secondary | ICD-10-CM

## 2015-04-21 DIAGNOSIS — Z794 Long term (current) use of insulin: Secondary | ICD-10-CM | POA: Diagnosis not present

## 2015-04-21 DIAGNOSIS — N182 Chronic kidney disease, stage 2 (mild): Secondary | ICD-10-CM

## 2015-04-21 NOTE — Progress Notes (Signed)
Patient ID: Nicole Strong, female   DOB: [birth year], 65 y.o.   MRN: [id number]  HPI: Nicole Strong is a 65 y.o.-year-old femalemale, returning for f/u for DM2, dx in ~2009, insulin-dependent, uncontrolled, with complications (CKD). Last visit 1.5 mo ago.  She had high sugars and weight gain over the Holidays. She also had pbs with cost of the meds and had to stop the Humalog for a month since last visit. She now changed insurances >> picked Humalog 2 days before last visit. Now does not have a copay for Humalog and 7$ for Lantus. She was also off Metformin. HbA1c was higher.  Last hemoglobin A1c was: Lab Results  Component Value Date   HGBA1C 9.0 03/07/2015   HGBA1C 8.1 10/08/2014   HGBA1C 7.6* 07/08/2014   Pt is on a regimen of: - Lantus 18 units at bedtime (forgot to increase to 22 as advised!) - Humalog:  10 units with a small meal 15 units with a larger meal - Metformin ER 500 mg 2x a day >> only restarted recently. She tried regular Metformin >> diarrhea >> had to stop it She tried Januvia >> had diarrhea from it. She tried Tradjenta >> could not afford it, also blocked the abs of her other meds. She tried Wechol 1000 mg po bid >> could not afford it She tried Actos >> sugars better, but had bladder cancer We stopped Starlix 120 mg 3x a day with meals.  Pt checks her sugars 1x a day and they are - am: 200s. 300 this am (Glucerna last night) >> 130-180, 205 >> 136-185 >> 100-140s >> <200 >> 145-171, 185 - 2h after b'fast: n/c >> 181-205 >> 173-219 >> n/c >> 200-260 >> 188, 199, 215 - before lunch: 200s >> n/c >> 180 >> n/c  - 2h after lunch: n/c >> n/c >> 151-199 - before dinner: 200s >> 121-199 >> 106x1, 186-215 >> n/c >> 200-230 >> 148-185 - 2h after dinner: n/c >> 158-205 >> 188, 204 >> n/c >> 165-242 - bedtime: n/c >> 192-312 >> 180-213, 283 >> 180-198 >> 200-300 >> 195-218, 299 - nighttime: n/c >> 121-208 >> 149-186 No lows. Lowest sugar was 200 >> 130 >> 103 >> 107; she has  hypoglycemia awareness at 200.  Highest sugar was >600 - ED visit >> 300s >> 286 x1 >> low 200 few times >> 300s  Glucometer: Freestyle Lite  Pt's meals are: - Breakfast: eggs + ham + toast b/c gastroparesis - Lunch >> mostly brunch: 1/2 sandwich - Dinner: salad or TV dinner or egg + English muffin - Snacks: cheese, PB crackers No sodas  - + CKD, last BUN/creatinine:  Lab Results  Component Value Date   BUN 20 12/29/2014   CREATININE 1.09* 12/29/2014  She had L nephrectomy 2011. - last set of lipids: Lab Results  Component Value Date   CHOL 127 04/29/2014   HDL 53.10 04/29/2014   LDLCALC 46 04/29/2014   LDLDIRECT 210.4 12/31/2013   TRIG 141.0 04/29/2014   CHOLHDL 2 04/29/2014   - last eye exam was in 03/2013. No DR. Has double vision from GBS. - + numbness and tingling in her feet.  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 In 2011.  She had pancreatitis >> ED 03/15/2014. On 04/06/2014 >> went to ED with Hgly , CBG >600.  ROS: Constitutional: + weight gain, no fatigue, no subjective hypothermia Eyes: no blurry vision, no xerophthalmia ENT: no sore throat, no  nodules palpated in throat, no dysphagia/odynophagia, no hoarseness Cardiovascular: no CP/SOB/palpitations/leg swelling Respiratory: no cough/no SOB Gastrointestinal: no N/V/D/C/acid reflux Musculoskeletal: no muscle/no joint aches Skin: no rashes Neurological: no tremors/numbness/tingling/dizziness  I reviewed pt's medications, allergies, PMH, social hx, family hx, and changes were documented in the history of present illness. Otherwise, unchanged from my initial visit note:  Past Medical History  Diagnosis Date  . Diabetes mellitus     2  . Hyperlipidemia   . Hypertension   . Renal mass     BEING WORKED UP BY UROLOGY  . 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 Guillain-Barre syndrome ([place])   . Renal disorder     L kidney removed  . Cancer ([place])   . 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 Cesarean section    . Cholecystectomy    . Nephrectomy     History   Social History  . Marital Status: Divorced    Spouse Name: N/A    Number of Children: 1  . Years of Education: 14   Occupational History  .      disabled   Social History Main Topics  . Smoking status: Former Research scientist (life sciences)  . Smokeless tobacco: Never Used  . Alcohol Use: No  . Drug Use: No   Social History Narrative   Patient lives alone.    Disabled.   Education college education.   Right handed.   Caffeine. Rare one cup.   Current Outpatient Prescriptions on File Prior to Visit  Medication Sig Dispense Refill  . amitriptyline (ELAVIL) 10 MG tablet TAKE 4 TABS AT BEDTIME  FOR 1 WEEK THEN INCREASE TO 5 AT BEDTIME IF NECESSARY 150 tablet 3  . aspirin EC 81 MG tablet Take 81 mg by mouth daily.    . calcium citrate (CALCITRATE - DOSED IN MG ELEMENTAL CALCIUM) 950 MG tablet Take 200 mg of elemental calcium by mouth daily.    . cholecalciferol (VITAMIN D) 1000 UNITS tablet Take 5,000 Units by mouth daily.    . COD LIVER OIL PO Take 1 capsule by mouth daily.    Marland Kitchen gabapentin (NEURONTIN) 300 MG capsule TAKE 1 CAPSULE (300 MG TOTAL) BY MOUTH 4 (FOUR) TIMES DAILY. 120 capsule 3  . linagliptin (TRADJENTA) 5 MG TABS tablet Take 1 tablet (5 mg total) by mouth daily. 30 tablet 2  . lisinopril (PRINIVIL,ZESTRIL) 20 MG tablet Take 20 mg by mouth daily.    . metFORMIN (GLUCOPHAGE) 1000 MG tablet TAKE 1 TABLET (1,000 MG TOTAL) BY MOUTH 2 (TWO) TIMES DAILY WITH A MEAL. OFFICE VISIT DUE FOR MORE REFILLS. 60 tablet 0  . metoCLOPramide (REGLAN) 10 MG tablet Take 1 tablet (10 mg total) by mouth 4 (four) times daily -  before meals and at bedtime. 120 tablet 2  . nateglinide (STARLIX) 60 MG tablet Take 1 tablet (60 mg total) by mouth 3 (three) times daily with meals. 90 tablet 2  . promethazine (PHENERGAN) 25 MG tablet TAKE 1 TABLET (25 MG  TOTAL) BY MOUTH AS NEEDED as directed by dr dohmeier  FOR NAUSEA. 90 tablet 1  . simvastatin (ZOCOR) 40 MG tablet Take 1 tablet (40 mg total) by mouth at bedtime. 30 tablet 2  . traMADol (ULTRAM) 50 MG tablet Take 50-100 mg by mouth every 6 (six) hours as needed for moderate pain. Take 1 tablet in the morning and 2 tablets at night  No current facility-administered medications on file prior to visit.   Allergies  Allergen Reactions  . Biaxin [Clarithromycin] Other (See Comments)    "face turns red"  . Codeine Nausea And Vomiting and Other (See Comments)    Hallucinations  . Erythromycin Nausea And Vomiting  . Januvia [Sitagliptin]   . Prednisone     Mania.   Family History  Problem Relation Age of Onset  . Cancer Father     PROSTATE  . Cancer Sister     BREAST  . Healthy Mother    PE: BP 122/80 mmHg  Pulse 81  Temp(Src) 98.2 F (36.8 C) (Oral)  Resp 12  Wt 215 lb (97.523 kg)  SpO2 98% Body mass index is 40.64 kg/(m^2). Wt Readings from Last 3 Encounters:  04/21/15 215 lb (97.523 kg)  02/25/15 204 lb (92.534 kg)  08/18/14 213 lb 8 oz (96.843 kg)   Constitutional: overweight, in NAD, in wheelchair Eyes: PERRLA, EOMI, no exophthalmos ENT: moist mucous membranes, no thyromegaly, no cervical lymphadenopathy Cardiovascular: RRR, No MRG Respiratory: CTA B Gastrointestinal: abdomen soft, NT, ND, BS+ Musculoskeletal: no deformities, strength intact in all 4 Skin: dry, warm, no rashes Neurological: no tremor with outstretched hands, DTR not elicited  ASSESSMENT: 1. DM2, insulin-dependent, uncontrolled, with complications - CKD  She has Gastroparesis (self-dx) 2/2 GB.  PLAN:  1. Patient with long-standing, uncontrolled diabetes, on oral antidiabetic regimen + basal-bolus regimen and Metformin. She has not increased the Lantus dose as advised at last visit and she was off Metformin until last week >> will advise her again to increase Lantus as all sugars are higher and  not to stop Metformin again. - reviewed last HbA1c >> 9.0% (higher) - I suggested to:  Patient Instructions  Please increase:  - Lantus to 22 units at bedtime  Please continue: - Humalog:  10 units with a small meal 15 units with a larger meal  Please continue Metformin ER 500 mg 2x a day.  Please return in 1.5 months with your sugar log.   - continue checking sugars at different times of the day - check 2-3 times a day, rotating checks - advised for yearly eye exams >> she is due - Return to clinic in 1.5 mo with sugar log

## 2015-04-21 NOTE — Patient Instructions (Signed)
Please increase:  - Lantus to 22 units at bedtime  Please continue: - Humalog:  10 units with a small meal 15 units with a larger meal  Please continue Metformin ER 500 mg 2x a day.  Please return in 1.5 months with your sugar log.

## 2015-04-22 ENCOUNTER — Other Ambulatory Visit: Payer: Self-pay | Admitting: Internal Medicine

## 2015-05-20 ENCOUNTER — Encounter: Payer: Self-pay | Admitting: Neurology

## 2015-05-23 ENCOUNTER — Other Ambulatory Visit: Payer: Self-pay

## 2015-05-23 MED ORDER — PROMETHAZINE HCL 25 MG PO TABS: ORAL_TABLET | ORAL | Status: DC

## 2015-05-24 ENCOUNTER — Other Ambulatory Visit: Payer: Self-pay

## 2015-05-24 DIAGNOSIS — E1340 Other specified diabetes mellitus with diabetic neuropathy, unspecified: Secondary | ICD-10-CM

## 2015-05-24 DIAGNOSIS — G6181 Chronic inflammatory demyelinating polyneuritis: Secondary | ICD-10-CM

## 2015-05-24 DIAGNOSIS — G6189 Other inflammatory polyneuropathies: Secondary | ICD-10-CM

## 2015-05-24 MED ORDER — AMITRIPTYLINE HCL 10 MG PO TABS: 40.0000 mg | ORAL_TABLET | Freq: Every day | ORAL | Status: DC

## 2015-05-25 ENCOUNTER — Other Ambulatory Visit: Payer: Self-pay

## 2015-05-25 NOTE — Telephone Encounter (Signed)
Pt sent a mychart message that says that [facility] did not receive her phenergan RX as ordered by Dr. Brett Fairy on Monday.  I called pt's [facility] to ensure they got the phenergan RX. Spoke to [place], Software engineer. They will not fill the RX for phenergan because the sig does not address how often the pt may take the phenergan. The sig currently states "TAKE 1 TABLET (25 MG TOTAL) BY MOUTH AS NEEDED AS DIRECTED BY DR Brett Fairy FOR NAUSEA".  Pharmacy wants clarification on how often "as needed" is. (every how many hours?)  I advised pharmacist that Dr. Brett Fairy left the office sick today and I will have to send this request to the [facility] physician.

## 2015-05-25 NOTE — Telephone Encounter (Signed)
RX sent to Dr. Leta Baptist for clarification.

## 2015-05-26 MED ORDER — PROMETHAZINE HCL 25 MG PO TABS: 25.0000 mg | ORAL_TABLET | Freq: Three times a day (TID) | ORAL | Status: DC | PRN

## 2015-05-26 NOTE — Telephone Encounter (Signed)
Spoke to Dr. Brett Fairy, and received a verbal order to change the phenergan RX to "Take 25 mg tablet by mouth every 8 hours as needed for nausea." Sig updated and sent to Dr. Brett Fairy to sign.  Dr. Leta Baptist, you may disregard this message. Dr. Brett Fairy has handled it. Thank you!

## 2015-05-27 ENCOUNTER — Other Ambulatory Visit: Payer: Self-pay | Admitting: Internal Medicine

## 2015-05-30 NOTE — Telephone Encounter (Signed)
Last refill the instructions said: Inject 0.1-0.15 mLs (10-15 Units total) into the skin 3 (three) times daily with meals. These directions on this refill are different, please advise

## 2015-06-04 ENCOUNTER — Encounter: Payer: Self-pay | Admitting: Internal Medicine

## 2015-06-05 ENCOUNTER — Encounter: Payer: Self-pay | Admitting: Internal Medicine

## 2015-06-06 ENCOUNTER — Encounter: Payer: Self-pay | Admitting: Family Medicine

## 2015-06-06 NOTE — Telephone Encounter (Signed)
Pt having issues with her big toe, should she come here?

## 2015-06-07 ENCOUNTER — Encounter: Payer: Self-pay | Admitting: Family Medicine

## 2015-06-07 ENCOUNTER — Ambulatory Visit (INDEPENDENT_AMBULATORY_CARE_PROVIDER_SITE_OTHER): Payer: Medicare Other | Admitting: Family Medicine

## 2015-06-07 VITALS — BP 120/60 | HR 73 | Temp 99.4°F | Ht 61.0 in | Wt 204.0 lb

## 2015-06-07 DIAGNOSIS — L03032 Cellulitis of left toe: Secondary | ICD-10-CM

## 2015-06-07 DIAGNOSIS — M79675 Pain in left toe(s): Secondary | ICD-10-CM | POA: Diagnosis not present

## 2015-06-07 LAB — COMPREHENSIVE METABOLIC PANEL
ALT: 13 U/L (ref 0–35)
AST: 13 U/L (ref 0–37)
Albumin: 3.4 g/dL — ABNORMAL LOW (ref 3.5–5.2)
Alkaline Phosphatase: 79 U/L (ref 39–117)
BUN: 24 mg/dL — ABNORMAL HIGH (ref 6–23)
CHLORIDE: 100 meq/L (ref 96–112)
CO2: 29 meq/L (ref 19–32)
CREATININE: 1.31 mg/dL — AB (ref 0.40–1.20)
Calcium: 9.2 mg/dL (ref 8.4–10.5)
GFR: 43.05 mL/min — AB (ref >60.00)
Glucose, Bld: 270 mg/dL — ABNORMAL HIGH (ref 70–99)
Potassium: 4.3 mEq/L (ref 3.5–5.1)
SODIUM: 136 meq/L (ref 135–145)
Total Bilirubin: 0.5 mg/dL (ref 0.2–1.2)
Total Protein: 6.1 g/dL (ref 6.0–8.3)

## 2015-06-07 LAB — URIC ACID: Uric Acid, Serum: 8.1 mg/dL — ABNORMAL HIGH (ref 2.4–7.0)

## 2015-06-07 MED ORDER — CEPHALEXIN 500 MG PO CAPS: 500.0000 mg | ORAL_CAPSULE | Freq: Two times a day (BID) | ORAL | Status: DC

## 2015-06-07 NOTE — Progress Notes (Signed)
Pre visit review using our clinic review tool, if applicable. No additional management support is needed unless otherwise documented below in the visit note. 

## 2015-06-07 NOTE — Progress Notes (Signed)
Patient ID: Nicole Strong, female    DOB: 28-Jun-1950  Age: 65 y.o. MRN: [id number]    Subjective:  Subjective HPI Nicole Strong presents for c/o swelling and redness in L big toe.    Review of Systems  Constitutional: Negative for diaphoresis, appetite change, fatigue and unexpected weight change.  Eyes: Negative for pain, redness and visual disturbance.  Respiratory: Negative for cough, chest tightness, shortness of breath and wheezing.   Cardiovascular: Negative for chest pain, palpitations and leg swelling.  Endocrine: Negative for cold intolerance, heat intolerance, polydipsia, polyphagia and polyuria.  Genitourinary: Negative for dysuria, frequency and difficulty urinating.  Neurological: Negative for dizziness, light-headedness, numbness and headaches.    History Past Medical History  Diagnosis Date  . Diabetes mellitus     2  . Hyperlipidemia   . Hypertension   . Renal mass     BEING WORKED UP BY UROLOGY  . 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 Renal disorder     L kidney removed  . Cancer ([place])   . 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   Her family history includes Cancer in her father and sister; Healthy in her mother.She reports that she has quit smoking. She has never used smokeless tobacco. She reports that she does not drink alcohol or use illicit drugs.  Current Outpatient Prescriptions on File Prior to Visit  Medication Sig Dispense Refill  . albuterol (VENTOLIN HFA) 108 (90 BASE) MCG/ACT inhaler Inhale 2 puffs into the lungs every 6 (six) hours as needed for wheezing or shortness of breath. 1 Inhaler 1  . amitriptyline (ELAVIL) 10 MG tablet Take 4 tablets (40 mg total) by mouth at bedtime. 120 tablet 5  . aspirin EC 81 MG tablet Take 81 mg by mouth daily.    Marland Kitchen atorvastatin (LIPITOR) 10 MG tablet  TAKE 1 TABLET BY MOUTH 3 TIMES PER WEEK 12 tablet 5  . azelastine (ASTELIN) 0.1 % nasal spray Place 2 sprays into both nostrils 2 (two) times daily. Use in each nostril as directed 30 mL 12  . calcium citrate (CALCITRATE - DOSED IN MG ELEMENTAL CALCIUM) 950 MG tablet Take 200 mg of elemental calcium by mouth daily.    . Cholecalciferol (VITAMIN D3) 5000 UNITS CAPS Take 5,000 Units by mouth.    . COD LIVER OIL PO Take 1 capsule by mouth daily.    Marland Kitchen FREESTYLE LITE test strip USE TO TEST BLOOD SUGAR 2 TIMES DAILY AS INSTRUCTED. DX CODE: E11.29 100 each 11  . gabapentin (NEURONTIN) 300 MG capsule The patient can take 1 capsule of 300 mg in the morning, 2 capsules (600 mg) at lunch,  1 capsule (300 mg)  in the afternoon,  and 2 capsules (600 mg)   at bedtime. TOTAL 1800 mg 180 capsule 5  . glucose blood (FREESTYLE LITE) test strip Use to test blood sugar 2 times daily as instructed. Dx code: E11.29 100 each 11  . Insulin Glargine (LANTUS SOLOSTAR) 100 UNIT/ML Solostar Pen Inject 22 Units into the skin daily at 10 pm. 5 pen 1  . insulin lispro (HUMALOG KWIKPEN) 100 UNIT/ML KiwkPen Pharmacy: INJECT 10-15 UNITS INTO THE SKIN 3x DAILY 15 pen 2  . Insulin Pen Needle 32G X 4 MM MISC Use 1x a day 100 each 2  . ipratropium (ATROVENT HFA) 17  MCG/ACT inhaler Inhale 2 puffs into the lungs every 6 (six) hours as needed for wheezing. 1 Inhaler 2  . LANTUS SOLOSTAR 100 UNIT/ML Solostar Pen INJECT 18 UNITS INTO THE SKIN DAILY AT 10 PM. 15 mL 3  . lisinopril (PRINIVIL,ZESTRIL) 20 MG tablet Take 20 mg by mouth at bedtime.     . metFORMIN (GLUCOPHAGE-XR) 500 MG 24 hr tablet TAKE 1 TABLET (500 MG TOTAL) BY MOUTH 2 (TWO) TIMES DAILY. 60 tablet 2  . oxyCODONE-acetaminophen (PERCOCET/ROXICET) 5-325 MG per tablet Take 1 tablet by mouth every 4 (four) hours as needed. 15 tablet 0  . promethazine (PHENERGAN) 25 MG tablet Take 1 tablet (25 mg total) by mouth every 8 (eight) hours as needed for nausea or vomiting. 90 tablet 3  .  silver sulfADIAZINE (SILVADENE) 1 % cream Apply 1 application topically daily. 50 g 0  . traMADol (ULTRAM) 50 MG tablet Take 1 tablet in the morning and 2 tablets at night 90 tablet 3   No current facility-administered medications on file prior to visit.     Objective:  Objective Physical Exam  Constitutional: She is oriented to person, place, and time.  Musculoskeletal: She exhibits edema. She exhibits no tenderness.       Feet:  Neurological: She is alert and oriented to person, place, and time.  Psychiatric: She has a normal mood and affect. Her behavior is normal. Judgment and thought content normal.  Vitals reviewed.  BP 120/60 mmHg  Pulse 73  Temp(Src) 99.4 F (37.4 C) (Oral)  Ht 5\' 1"  (1.549 m)  Wt 204 lb (92.534 kg)  BMI 38.57 kg/m2  SpO2 96% Wt Readings from Last 3 Encounters:  06/07/15 204 lb (92.534 kg)  04/21/15 215 lb (97.523 kg)  02/25/15 204 lb (92.534 kg)     Lab Results  Component Value Date   WBC 6.4 04/29/2014   HGB 11.7* 04/29/2014   HCT 33.7* 04/29/2014   PLT 233.0 04/29/2014   GLUCOSE 270* 06/07/2015   CHOL 127 04/29/2014   TRIG 141.0 04/29/2014   HDL 53.10 04/29/2014   LDLDIRECT 210.4 12/31/2013   LDLCALC 46 04/29/2014   ALT 13 06/07/2015   AST 13 06/07/2015   NA 136 06/07/2015   K 4.3 06/07/2015   CL 100 06/07/2015   CREATININE 1.31* 06/07/2015   BUN 24* 06/07/2015   CO2 29 06/07/2015   TSH 1.860 08/14/2013   INR 0.96 06/18/2012   HGBA1C 9.0 03/07/2015   MICROALBUR 277.6* 04/29/2014    Dg Chest 2 View  01/27/2015  CLINICAL DATA:  Cough. EXAM: CHEST  2 VIEW COMPARISON:  December 09, 2014. FINDINGS: The heart size and mediastinal contours are within normal limits. Both lungs are clear. No pneumothorax or pleural effusion is noted. The visualized skeletal structures are unremarkable. IMPRESSION: No active cardiopulmonary disease. Electronically Signed   By: Marijo Conception, M.D.   On: 01/27/2015 17:19     Assessment & Plan:  Plan I  am having [name] start on cephALEXin. I am also having her maintain her COD LIVER OIL PO, aspirin EC, calcium citrate, lisinopril, oxyCODONE-acetaminophen, Insulin Pen Needle, glucose blood, atorvastatin, albuterol, Vitamin D3, gabapentin, traMADol, azelastine, ipratropium, silver sulfADIAZINE, FREESTYLE LITE, metFORMIN, Insulin Glargine, LANTUS SOLOSTAR, amitriptyline, promethazine, and insulin lispro.  Meds ordered this encounter  Medications  . cephALEXin (KEFLEX) 500 MG capsule    Sig: Take 1 capsule (500 mg total) by mouth 2 (two) times daily.    Dispense:  20 capsule    Refill:  0    Problem List Items Addressed This Visit    None    Visit Diagnoses    Pain in toe of left foot    -  Primary    Relevant Orders    Uric acid (Completed)    Comprehensive metabolic panel (Completed)    Cellulitis of toe of left foot        Relevant Medications    cephALEXin (KEFLEX) 500 MG capsule       Follow-up: Return if symptoms worsen or fail to improve.  Ann Held, DO

## 2015-06-07 NOTE — Patient Instructions (Signed)

## 2015-06-08 ENCOUNTER — Encounter: Payer: Self-pay | Admitting: Family Medicine

## 2015-06-08 ENCOUNTER — Telehealth: Payer: Self-pay | Admitting: *Deleted

## 2015-06-08 ENCOUNTER — Ambulatory Visit: Payer: Medicare Other | Admitting: Internal Medicine

## 2015-06-08 DIAGNOSIS — R21 Rash and other nonspecific skin eruption: Secondary | ICD-10-CM

## 2015-06-08 MED ORDER — MOMETASONE FUROATE 0.1 % EX CREA: 1.0000 "application " | TOPICAL_CREAM | Freq: Every day | CUTANEOUS | Status: DC

## 2015-06-08 MED ORDER — FEBUXOSTAT 40 MG PO TABS: 40.0000 mg | ORAL_TABLET | Freq: Every day | ORAL | Status: DC

## 2015-06-08 NOTE — Telephone Encounter (Signed)
PA initiated on [email or web address], awaiting determination. JG//CMA 

## 2015-06-09 ENCOUNTER — Encounter: Payer: Self-pay | Admitting: Family Medicine

## 2015-06-09 NOTE — Telephone Encounter (Signed)
PA approved 02/11/15 through 02/12/16 by Aurea Graff. Approval letter sent for scanning. JG//CMA

## 2015-06-10 MED ORDER — ALLOPURINOL 100 MG PO TABS: 100.0000 mg | ORAL_TABLET | Freq: Every day | ORAL | Status: DC

## 2015-06-12 DIAGNOSIS — M79675 Pain in left toe(s): Secondary | ICD-10-CM | POA: Insufficient documentation

## 2015-06-12 NOTE — Assessment & Plan Note (Signed)
R/o gout Check labs

## 2015-06-13 MED ORDER — FEBUXOSTAT 40 MG PO TABS: 40.0000 mg | ORAL_TABLET | Freq: Every day | ORAL | Status: DC

## 2015-06-13 NOTE — Telephone Encounter (Signed)
Med list updated

## 2015-06-13 NOTE — Addendum Note (Signed)
Addended by: Kelle Darting A on: 06/13/2015 07:41 AM   Modules accepted: Orders, Medications

## 2015-06-24 ENCOUNTER — Other Ambulatory Visit: Payer: Self-pay | Admitting: Internal Medicine

## 2015-06-28 ENCOUNTER — Ambulatory Visit (INDEPENDENT_AMBULATORY_CARE_PROVIDER_SITE_OTHER): Payer: Medicare Other | Admitting: Podiatry

## 2015-06-28 ENCOUNTER — Encounter: Payer: Self-pay | Admitting: Adult Health

## 2015-06-28 ENCOUNTER — Ambulatory Visit (INDEPENDENT_AMBULATORY_CARE_PROVIDER_SITE_OTHER): Payer: Medicare Other

## 2015-06-28 ENCOUNTER — Ambulatory Visit (INDEPENDENT_AMBULATORY_CARE_PROVIDER_SITE_OTHER): Payer: Medicare Other | Admitting: Adult Health

## 2015-06-28 ENCOUNTER — Encounter: Payer: Self-pay | Admitting: Podiatry

## 2015-06-28 VITALS — BP 160/76 | HR 79 | Resp 12

## 2015-06-28 VITALS — BP 152/80 | HR 71 | Ht 61.0 in

## 2015-06-28 DIAGNOSIS — G610 Guillain-Barre syndrome: Secondary | ICD-10-CM

## 2015-06-28 DIAGNOSIS — M79672 Pain in left foot: Secondary | ICD-10-CM

## 2015-06-28 DIAGNOSIS — L03032 Cellulitis of left toe: Secondary | ICD-10-CM

## 2015-06-28 DIAGNOSIS — L03012 Cellulitis of left finger: Secondary | ICD-10-CM

## 2015-06-28 MED ORDER — DOXYCYCLINE HYCLATE 100 MG PO TABS: 100.0000 mg | ORAL_TABLET | Freq: Two times a day (BID) | ORAL | Status: DC

## 2015-06-28 NOTE — Progress Notes (Signed)
   Subjective:    Patient ID: Nicole Strong, female    DOB: 14-May-1950, 65 y.o.   MRN: [id number]  HPI     Patient presents today with approximately 4 week history of a red swollen painful left great toe. She noticed the toe becoming inflamed and red after a out-of-town visit and wearing a tight shoe. She noticed some what appear to be fluid underneath the nail. Patient has had medical treatment for this problem including 10 days of cephalexin 500 mg by mouth twice a day which she completed approximately a week ago. Also, patient is currently taking Uloric because her uric acid that was evaluated in June 08, 2015 demonstrated elevated at 8.1. Patient states that over time the toe is gradually improved demonstrating less redness and less pain. She is unsure whether she noticed any active drainage while taking you lower.  Patient has chronic debilitating demyelinating disease and is able to transfer from wheelchair to treatment chair Patient is a diabetic and denies any history of amputation, claudication or skin ulceration      Review of Systems  Musculoskeletal: Positive for gait problem.  Skin: Positive for color change.       Objective:   Physical Exam  Patient appears orientated 3  Patient has a picture of the left foot when symptoms first developed demonstrated a general erythema and edema in the left hallux which appears significantly more noticeable than on examination today  Vascular: DP pulses 2/4 bilaterally PT pulses 1/4 bilaterally Capillary reflex immediate bilaterally  Neurological: Sensation to 10 g monofilament wire intact 5/5 left 4/5 right Vibratory sensation reactive bilaterally Ankle reflex equal and reactive bilaterally  Dermatological: The left hallux nail plate is narrowed on medial lateral borders and has mobility when distracted dorsally. There is a dark discolored diffuse nailbed. There is local erythema that extends to the proximal interphalangeal  joint to the distal left hallux. No warmth is noted  Musculoskeletal: Manual motor testing dorsi flexion 5/5 bilaterally Plantar flexion 5/5 bilaterally  X-ray examination nonweightbearing left foot dated 06/28/2015  Intact bony structures without fracture and/or dislocation Decreased bone density noted 2 / 3 views Posterior and inferior calcaneal spurs Hammertoe second No increase in soft tissue density noted  Radiographic impression: No acute bony abnormality noted in the left foot x-ray dated 06/28/2015  Lab results reviewed on 06/07/2015 Uric acid 8.1 Blood glucose elevated 270 BUN elevated 24 albumin low at 3.4  creatinine serum elevated 1.31      Assessment & Plan:   Assessment: Subungual hematoma left hallux with associated paronychia I do not think that gout is the primary problem in the left hallux because the photograph patient had of the left hallux demonstrated a dark color beneath the nail plate and after debridement of left hallux nail plate the underlying nailbed had a mixture of moist and dried blood.  Plan: As the left hallux nail was lifting off the nailbed the area was prepped with skin cleanser in the left hallux nail was easily debrided to the posterior nail fold area without any bleeding. The nailbed demonstrates some moist and dried blood without any active drainage, malodor or warmth. An antibiotic dressing was applied Rx doxycycline 100 mg by mouth twice a day 10 days Maintain Hibiclens soaks daily and topical antibiotic ointment daily  Reappoint 2 weeks

## 2015-06-28 NOTE — Patient Instructions (Signed)
If your symptoms worsen or you develop new symptoms please let us know.   

## 2015-06-28 NOTE — Patient Instructions (Addendum)
Today your diabetic foot screen revealed a inflammation/infection around the left great toenail. The nail was debrided revealing an inflamed nailbed. Begin doxycycline 100 mg one twice a day on an empty stomach 10 days Okay to soak in your Hibiclens once daily and apply topical antibiotic ointment and Band-Aid If you develop any sudden swelling, fever, redness present to emergency department   Diabetes and Foot Care Diabetes may cause you to have problems because of poor blood supply (circulation) to your feet and legs. This may cause the skin on your feet to become thinner, break easier, and heal more slowly. Your skin may become dry, and the skin may peel and crack. You may also have nerve damage in your legs and feet causing decreased feeling in them. You may not notice minor injuries to your feet that could lead to infections or more serious problems. Taking care of your feet is one of the most important things you can do for yourself.  HOME CARE INSTRUCTIONS  Wear shoes at all times, even in the house. Do not go barefoot. Bare feet are easily injured.  Check your feet daily for blisters, cuts, and redness. If you cannot see the bottom of your feet, use a mirror or ask someone for help.  Wash your feet with warm water (do not use hot water) and mild soap. Then pat your feet and the areas between your toes until they are completely dry. Do not soak your feet as this can dry your skin.  Apply a moisturizing lotion or petroleum jelly (that does not contain alcohol and is unscented) to the skin on your feet and to dry, brittle toenails. Do not apply lotion between your toes.  Trim your toenails straight across. Do not dig under them or around the cuticle. File the edges of your nails with an emery board or nail file.  Do not cut corns or calluses or try to remove them with medicine.  Wear clean socks or stockings every day. Make sure they are not too tight. Do not wear knee-high stockings  since they may decrease blood flow to your legs.  Wear shoes that fit properly and have enough cushioning. To break in new shoes, wear them for just a few hours a day. This prevents you from injuring your feet. Always look in your shoes before you put them on to be sure there are no objects inside.  Do not cross your legs. This may decrease the blood flow to your feet.  If you find a minor scrape, cut, or break in the skin on your feet, keep it and the skin around it clean and dry. These areas may be cleansed with mild soap and water. Do not cleanse the area with peroxide, alcohol, or iodine.  When you remove an adhesive bandage, be sure not to damage the skin around it.  If you have a wound, look at it several times a day to make sure it is healing.  Do not use heating pads or hot water bottles. They may burn your skin. If you have lost feeling in your feet or legs, you may not know it is happening until it is too late.  Make sure your health care provider performs a complete foot exam at least annually or more often if you have foot problems. Report any cuts, sores, or bruises to your health care provider immediately. SEEK MEDICAL CARE IF:   You have an injury that is not healing.  You have cuts or breaks  in the skin.  You have an ingrown nail.  You notice redness on your legs or feet.  You feel burning or tingling in your legs or feet.  You have pain or cramps in your legs and feet.  Your legs or feet are numb.  Your feet always feel cold. SEEK IMMEDIATE MEDICAL CARE IF:   There is increasing redness, swelling, or pain in or around a wound.  There is a red line that goes up your leg.  Pus is coming from a wound.  You develop a fever or as directed by your health care provider.  You notice a bad smell coming from an ulcer or wound.   This information is not intended to replace advice given to you by your health care provider. Make sure you discuss any questions you have  with your health care provider.   Document Released: 01/27/2000 Document Revised: 10/01/2012 Document Reviewed: 07/08/2012 Elsevier Interactive Patient Education [employer].

## 2015-06-28 NOTE — Progress Notes (Signed)
PATIENT: Nicole Strong DOB: 09-11-1950  REASON FOR VISIT: follow up- Nicole Strong syndrome HISTORY FROM: patient  HISTORY OF PRESENT ILLNESS: Nicole Strong is a 65 year old female with a history of axonal Gilliam Barr syndrome. She returns today for follow-up. The patient states that in 2011 she began having numbness in the legs and feet-- her son took her to the emergency room. Numbness eventually progressed where she was paralyzed however she did not have to be intubated. A spinal tap did confirm Gilliam Barr syndrome. She did undergo plasmapheresis. She reports that she was in the hospital for months and then transferred to rehabilitation. She states that since then she has continued to improve. She uses a wheelchair predominantly however in her home she will ambulate with a walker or holding onto furniture. She does feel that her vision is worse however she contributes this to her prescription. She reports that since Nicole Strong she has had some mild double vision. She reports her diabetes is under better control she has a follow-up with her endocrinologist in June. She continues to have diffuse numbness all over. She is also noticed some cramping in the feet. She reports that her water intake is good. She returns today for an evaluation.  HISTORY 12/29/14 [facility]): Nicole Strong is a 65 y.o. female , seen here as a revisit from Dr. Etter Sjogren for presumed axonal Nicole Strong syndrome ( 2012) and developed a chronic axonal neuron loss.  In 2015 Nicole Strong underwent an EMG with nerve conduction study which I basically order to qualify her for IV Ig therapy. There were other obstacles to IVIG as she has only one kidney. However the nerve conduction study and EMG performed by Dr. Jannifer Franklin documented axonal peripheral nerve injury and not demyelinating. 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 She seems to have had the axonal form of Nicole Strong which has a very slow recovery rate at often incomplete recovery. It is also more prone to have pain involvement. IVIG was for this reason not longer considered a feasible treatment option. At this time there is no specific treatment for axonal neuropathies except moderate diet moderate exercise lifestyle changes that will promote healthy nerves. 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 Follow for diabetes hopefully also will improve her overall neurologic health. She continues to gain weight on insulin which is common. She has struggled to visit the Nicole Strong syndrome and aftermath now for over 4 years and had excessive weight gain due to inactivity.  Interval history 12-29-14, The patient reports stabilized but not improving ambulation. She is able to ambulate through her small apartment , but sits in her wheelchair for some manual activities. She lives alone.  The patient reminded me that she suffered a head injury in 2004 she ended up being disabled for on short-term disability and was finally laid off her job. She worked at [employer]. The job was emotionally and physically stressful and depended on her productivity is beating fast and quick responding. 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  REVIEW OF SYSTEMS: Out of a complete 14 system review of symptoms, the patient complains only of the following symptoms, and all other reviewed systems are negative.  Memory loss, dizziness, headache, numbness, weakness, back pain, muscle cramps, walking difficulty, frequent waking, leg swelling, nausea, flushing, heat intolerance, eye discharge, double vision, loss of vision, blurred vision, ear discharge, ear  pain, runny nose, fatigue  ALLERGIES: Allergies  Allergen Reactions  . Biaxin [Clarithromycin] Other (See Comments)    "face turns red"  . Codeine Nausea And Vomiting and Other (See  Comments)    Hallucinations  . Erythromycin Nausea And Vomiting  . Januvia [Sitagliptin]   . Prednisone     Mania.    HOME MEDICATIONS: Outpatient Prescriptions Prior to Visit  Medication Sig Dispense Refill  . albuterol (VENTOLIN HFA) 108 (90 BASE) MCG/ACT inhaler Inhale 2 puffs into the lungs every 6 (six) hours as needed for wheezing or shortness of breath. 1 Inhaler 1  . amitriptyline (ELAVIL) 10 MG tablet Take 4 tablets (40 mg total) by mouth at bedtime. 120 tablet 5  . aspirin EC 81 MG tablet Take 81 mg by mouth daily.    Marland Kitchen atorvastatin (LIPITOR) 10 MG tablet TAKE 1 TABLET BY MOUTH 3 TIMES PER WEEK 12 tablet 5  . azelastine (ASTELIN) 0.1 % nasal spray Place 2 sprays into both nostrils 2 (two) times daily. Use in each nostril as directed 30 mL 12  . calcium citrate (CALCITRATE - DOSED IN MG ELEMENTAL CALCIUM) 950 MG tablet Take 200 mg of elemental calcium by mouth daily.    . Cholecalciferol (VITAMIN D3) 5000 UNITS CAPS Take 50,000 Units by mouth once a week.     . COD LIVER OIL PO Take 1 capsule by mouth daily.    Marland Kitchen doxycycline (VIBRA-TABS) 100 MG tablet Take 1 tablet (100 mg total) by mouth 2 (two) times daily. 20 tablet 0  . febuxostat (ULORIC) 40 MG tablet Take 1 tablet (40 mg total) by mouth daily. 30 tablet 2  . FREESTYLE LITE test strip USE TO TEST BLOOD SUGAR 2 TIMES DAILY AS INSTRUCTED. DX CODE: E11.29 100 each 11  . gabapentin (NEURONTIN) 300 MG capsule The patient can take 1 capsule of 300 mg in the morning, 2 capsules (600 mg) at lunch,  1 capsule (300 mg)  in the afternoon,  and 2 capsules (600 mg)   at bedtime. TOTAL 1800 mg 180 capsule 5  . glucose blood (FREESTYLE LITE) test strip Use to test blood sugar 2 times daily as instructed. Dx code: E11.29 100 each 11  . HUMALOG KWIKPEN 100 UNIT/ML KiwkPen INJECT 10-15 UNITS TOTAL INTO THE SKIN 3 (THREE) TIMESDAILY WITH MEALS. 15 mL 2  . Insulin Glargine (LANTUS SOLOSTAR) 100 UNIT/ML Solostar Pen Inject 22 Units into the skin  daily at 10 pm. 5 pen 1  . Insulin Pen Needle 32G X 4 MM MISC Use 1x a day 100 each 2  . ipratropium (ATROVENT HFA) 17 MCG/ACT inhaler Inhale 2 puffs into the lungs every 6 (six) hours as needed for wheezing. 1 Inhaler 2  . LANTUS SOLOSTAR 100 UNIT/ML Solostar Pen INJECT 18 UNITS INTO THE SKIN DAILY AT 10 PM. 15 mL 3  . lisinopril (PRINIVIL,ZESTRIL) 20 MG tablet Take 20 mg by mouth at bedtime.     . metFORMIN (GLUCOPHAGE-XR) 500 MG 24 hr tablet TAKE 1 TABLET (500 MG TOTAL) BY MOUTH 2 (TWO) TIMES DAILY. 60 tablet 2  . mometasone (ELOCON) 0.1 % cream Apply 1 application topically daily. 45 g 1  . oxyCODONE-acetaminophen (PERCOCET/ROXICET) 5-325 MG per tablet Take 1 tablet by mouth every 4 (four) hours as needed. 15 tablet 0  . promethazine (PHENERGAN) 25 MG tablet Take 1 tablet (25 mg total) by mouth every 8 (eight) hours as needed for nausea or vomiting. 90 tablet 3  . silver sulfADIAZINE (SILVADENE) 1 %  cream Apply 1 application topically daily. 50 g 0  . traMADol (ULTRAM) 50 MG tablet Take 1 tablet in the morning and 2 tablets at night 90 tablet 3   No facility-administered medications prior to visit.    PAST MEDICAL HISTORY: Past Medical History  Diagnosis Date  . Diabetes mellitus     2  . Hyperlipidemia   . Hypertension   . Renal mass     BEING WORKED UP BY UROLOGY  . 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 Guillain-Barre syndrome ([place])   . Renal disorder     L kidney removed  . Cancer ([place])   . 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 Cesarean section    . Cholecystectomy    . Nephrectomy      FAMILY HISTORY: Family History  Problem Relation Age of Onset  . Cancer Father     PROSTATE  . Cancer Sister     BREAST  . Healthy Mother     SOCIAL HISTORY: Social History   Social History  . Marital Status: Divorced    Spouse Name: N/A  . Number of Children: 1  .  Years of Education: 14   Occupational History  .      disabled   Social History Main Topics  . Smoking status: Former Research scientist (life sciences)  . Smokeless tobacco: Never Used  . Alcohol Use: No  . Drug Use: No  . Sexual Activity: Not on file   Other Topics Concern  . Not on file   Social History Narrative   Patient lives at home alone.    Disabled.   Education college education.   Right handed.   Caffeine. Rare one cup.      PHYSICAL EXAM  Filed Vitals:   06/28/15 1010  BP: 152/80  Pulse: 71  Height: 5\' 1"  (1.549 m)   There is no weight on file to calculate BMI.  Generalized: Well developed, in no acute distress   Neurological examination  Mentation: Alert oriented to time, place, history taking. Follows all commands speech and language fluent Cranial nerve II-XII: Pupils were equal round reactive to light. Extraocular movements were full, visual field were full on confrontational test. Facial sensation and strength were normal. Uvula tongue midline. Head turning and shoulder shrug  were normal and symmetric. Motor: The motor testing reveals 5 over 5 strength In the upper extremities. 5/5 strength in the right lower extremity. 4/5 strength in the left lower extremity. Good symmetric motor tone is noted throughout.  Sensory: Sensory testing is intact to soft touch on all 4 extremities. No evidence of extinction is noted.  Coordination: Cerebellar testing reveals good finger-nose-finger and heel-to-shin bilaterally.  Gait and station: Patient is in a wheelchair today. She did not bring her walker so we did not ambulate.    DIAGNOSTIC DATA (LABS, IMAGING, TESTING) - I reviewed patient records, labs, notes, testing and imaging myself where available.  Lab Results  Component Value Date   WBC 6.4 04/29/2014   HGB 11.7* 04/29/2014   HCT 33.7* 04/29/2014   MCV 88.5 04/29/2014   PLT 233.0 04/29/2014      Component Value Date/Time   NA 136 06/07/2015 1340   NA 138 12/29/2014 1042    K 4.3 06/07/2015 1340   CL 100 06/07/2015 1340   CO2 29 06/07/2015 1340   GLUCOSE 270* 06/07/2015 1340  GLUCOSE 284* 12/29/2014 1042   BUN 24* 06/07/2015 1340   BUN 20 12/29/2014 1042   CREATININE 1.31* 06/07/2015 1340   CALCIUM 9.2 06/07/2015 1340   PROT 6.1 06/07/2015 1340   PROT 5.7* 12/29/2014 1042   ALBUMIN 3.4* 06/07/2015 1340   ALBUMIN 3.6 12/29/2014 1042   AST 13 06/07/2015 1340   ALT 13 06/07/2015 1340   ALKPHOS 79 06/07/2015 1340   BILITOT 0.5 06/07/2015 1340   BILITOT 0.4 12/29/2014 1042   GFRNONAA 53* 12/29/2014 1042   GFRAA 61 12/29/2014 1042   Lab Results  Component Value Date   CHOL 127 04/29/2014   HDL 53.10 04/29/2014   LDLCALC 46 04/29/2014   LDLDIRECT 210.4 12/31/2013   TRIG 141.0 04/29/2014   CHOLHDL 2 04/29/2014   Lab Results  Component Value Date   HGBA1C 9.0 03/07/2015    Lab Results  Component Value Date   TSH 1.860 08/14/2013  ASSESSMENT AND PLAN 65 y.o. year old female  has a past medical history of Diabetes mellitus; Hyperlipidemia; Hypertension; Renal mass; Chronic inflammatory demyelinating polyneuritis ([place]); Guillain-Barre syndrome ([place]); Renal disorder; Cancer ([place]); and Single kidney. here with:  1. Nicole Strong syndrome  Overall  the patient has remained stable. We will continue to monitor her symptoms. She will continue on gabapentin 1 capsule in the morning, 2 capsules at lunch, 1 capsule in the afternoon and 2 capsules at bedtime. Patient advised that if her symptoms worsen or she develops any new symptoms she should let us know. She will follow-up in 6 months with Dr. Mechele Claude, MSN, NP-C 06/28/2015, 10:19 AM [facility] [address], [place], [place] [address] [phone]

## 2015-06-28 NOTE — Progress Notes (Signed)
I agree with the assessment and plan as directed by NP .The patient is known to me .   Vidhi Delellis, MD  

## 2015-07-06 LAB — HM DIABETES EYE EXAM

## 2015-07-13 ENCOUNTER — Ambulatory Visit: Payer: Medicare Other | Admitting: Podiatry

## 2015-07-13 ENCOUNTER — Encounter: Payer: Self-pay | Admitting: Podiatry

## 2015-07-13 ENCOUNTER — Ambulatory Visit (INDEPENDENT_AMBULATORY_CARE_PROVIDER_SITE_OTHER): Payer: Medicare Other | Admitting: Podiatry

## 2015-07-13 VITALS — BP 151/64 | HR 62 | Temp 99.1°F | Resp 12

## 2015-07-13 DIAGNOSIS — L03012 Cellulitis of left finger: Secondary | ICD-10-CM

## 2015-07-13 DIAGNOSIS — L03032 Cellulitis of left toe: Secondary | ICD-10-CM | POA: Diagnosis not present

## 2015-07-13 MED ORDER — SULFAMETHOXAZOLE-TRIMETHOPRIM 800-160 MG PO TABS: 1.0000 | ORAL_TABLET | Freq: Two times a day (BID) | ORAL | Status: DC

## 2015-07-13 NOTE — Patient Instructions (Signed)
The infection on the left toe is improving, however, still has low-grade swelling and redness. Begin taking Bactrim DS by mouth one twice a day 7 days Continue applying topical antibiotic ointment and a Band-Aid daily to the area

## 2015-07-14 NOTE — Progress Notes (Signed)
Patient ID: Nicole Strong, female   DOB: 1950/12/19, 65 y.o.   MRN: [id number]   Subjective: This patient presents for follow-up visit from 06/28/2015 and at that time the left hallux nail was debrided to resolve a subungual hematoma with associated paronychia. Doxycycline 100 mg by mouth twice a day 10 days prescribed with Hibiclens soaks and topical antibiotic ointment. Patient has completed the doxycycline without a complaint and still notices some low-grade swelling and redness around the left hallux nail   Patient initially presents  with approximately 4 week history of a red swollen painful left great toe. She noticed the toe becoming inflamed and red after a out-of-town visit and wearing a tight shoe. She noticed some what appear to be fluid underneath the nail. Patient has had medical treatment for this problem including 10 days of cephalexin 500 mg by mouth twice a day which she completed approximately a week ago. Also, patient is currently taking Uloric because her uric acid that was evaluated in June 08, 2015 demonstrated elevated at 8.1. Patient states that over time the toe is gradually improved demonstrating less redness and less pain. She is unsure whether she noticed any active drainage while taking you lower.  Patient has chronic debilitating demyelinating disease and is able to transfer from wheelchair to treatment chair Patient is a diabetic and denies any history of amputation, claudication or skin ulceration  Review of Systems  Musculoskeletal: Positive for gait problem.  Skin: Positive for color change.    Objective Patient appears orientated 3  Patient has a picture of the left foot when symptoms first developed demonstrated a general erythema and edema in the left hallux which appears significantly more noticeable than on examination today  Vascular: DP pulses 2/4 bilaterally PT pulses 1/4 bilaterally Capillary reflex immediate  bilaterally  Neurological: Sensation to 10 g monofilament wire intact 5/5 left 4/5 right Vibratory sensation reactive bilaterally Ankle reflex equal and reactive bilaterally  Dermatological: The left hallux nail plate has been debrided off with residual posterior nail. The nail pad appears dry. Posterior nail fold demonstrates low-grade erythema and edema and no active drainage. There is no malodor or warmth noted in around the left hallux nail and distal interphalangeal joint  Musculoskeletal: Manual motor testing dorsi flexion 5/5 bilaterally Plantar flexion 5/5 bilaterally  X-ray examination nonweightbearing left foot dated 06/28/2015  Intact bony structures without fracture and/or dislocation Decreased bone density noted 2 / 3 views Posterior and inferior calcaneal spurs Hammertoe second No increase in soft tissue density noted  Radiographic impression: No acute bony abnormality noted in the left foot x-ray dated 06/28/2015  Lab results reviewed on 06/07/2015 Uric acid 8.1 Blood glucose elevated 270 BUN elevated 24 albumin low at 3.4 creatinine serum elevated 1.31   Assessment: Improving paronychia left hallux  Plan: Patient has history of tolerating Bactrim without any difficulty Rx Bactrim DS by mouth twice a day 7 days DC Hibiclens soaks Apply topical antibiotic ointment to the nailbed daily  Reappoint 14 days or sooner if patient has a problem

## 2015-07-18 ENCOUNTER — Encounter: Payer: Self-pay | Admitting: Neurology

## 2015-07-18 ENCOUNTER — Other Ambulatory Visit: Payer: Self-pay

## 2015-07-18 DIAGNOSIS — G6181 Chronic inflammatory demyelinating polyneuritis: Secondary | ICD-10-CM

## 2015-07-18 MED ORDER — TRAMADOL HCL 50 MG PO TABS: ORAL_TABLET | ORAL | Status: DC

## 2015-07-18 NOTE — Telephone Encounter (Signed)
Faxed RX to [facility]. Received a receipt of confirmation.

## 2015-07-23 ENCOUNTER — Other Ambulatory Visit: Payer: Self-pay | Admitting: Family Medicine

## 2015-07-25 ENCOUNTER — Other Ambulatory Visit: Payer: Self-pay | Admitting: Neurology

## 2015-07-27 ENCOUNTER — Ambulatory Visit (INDEPENDENT_AMBULATORY_CARE_PROVIDER_SITE_OTHER): Payer: Medicare Other | Admitting: Podiatry

## 2015-07-27 VITALS — BP 142/69 | HR 86 | Resp 12

## 2015-07-27 DIAGNOSIS — L03031 Cellulitis of right toe: Secondary | ICD-10-CM

## 2015-07-27 DIAGNOSIS — L03012 Cellulitis of left finger: Secondary | ICD-10-CM

## 2015-07-27 NOTE — Progress Notes (Signed)
Patient ID: Nicole Strong, female   DOB: 11/11/1950, 65 y.o.   MRN: [id number]  Subjective: This patient presents for follow-up visit from 06/28/2015 and at that time the left hallux nail was debrided to resolve a subungual hematoma with associated paronychia. Doxycycline 100 mg by mouth twice a day 10 days prescribed with Hibiclens soaks and topical antibiotic ointment. Patient has completed the doxycycline without a complaint and still notices some low-grade swelling and redness around the left hallux nail. On the visit of 07/13/2015 Bactrim DS by mouth twice a day 7 days was prescribed and patient has completed the antibiotic without any difficulty. At this time patient feels that the infection has resolved   Objective Patient appears orientated 3  Patient has a picture of the left foot when symptoms first developed demonstrated a general erythema and edema in the left hallux which appears significantly more noticeable than on examination today  Vascular: DP pulses 2/4 bilaterally PT pulses 1/4 bilaterally Capillary reflex immediate bilaterally  Neurological: Sensation to 10 g monofilament wire intact 5/5 left 4/5 right Vibratory sensation reactive bilaterally Ankle reflex equal and reactive bilaterally  Dermatological: The left hallux nail plate has been debrided off with residual posterior nail. The nail pad appears laced with the Posterior nail fold , low-grade erythema and edema and no active drainage. There is no malodor or warmth noted in around the left hallux nail and distal interphalangeal joint  Musculoskeletal: Manual motor testing dorsi flexion 5/5 bilaterally Plantar flexion 5/5 bilaterally  Assessment: Resolve paronychia left hallux  Plan: Maintain topical antibiotic ointment and Band-Aid daily until the left hallux nailbed dries  Patient discharged and will reappoint at her request

## 2015-07-27 NOTE — Patient Instructions (Signed)
Apply topical antibiotic ointment and Band-Aid daily until the nailbed dries

## 2015-08-02 ENCOUNTER — Encounter: Payer: Self-pay | Admitting: Internal Medicine

## 2015-08-02 ENCOUNTER — Ambulatory Visit (INDEPENDENT_AMBULATORY_CARE_PROVIDER_SITE_OTHER): Payer: Medicare Other | Admitting: Internal Medicine

## 2015-08-02 VITALS — BP 128/82 | HR 70

## 2015-08-02 DIAGNOSIS — E1165 Type 2 diabetes mellitus with hyperglycemia: Secondary | ICD-10-CM

## 2015-08-02 DIAGNOSIS — N182 Chronic kidney disease, stage 2 (mild): Secondary | ICD-10-CM | POA: Diagnosis not present

## 2015-08-02 DIAGNOSIS — IMO0002 Reserved for concepts with insufficient information to code with codable children: Secondary | ICD-10-CM

## 2015-08-02 DIAGNOSIS — E1122 Type 2 diabetes mellitus with diabetic chronic kidney disease: Secondary | ICD-10-CM

## 2015-08-02 DIAGNOSIS — Z794 Long term (current) use of insulin: Secondary | ICD-10-CM

## 2015-08-02 LAB — POCT GLYCOSYLATED HEMOGLOBIN (HGB A1C): HEMOGLOBIN A1C: 7.4

## 2015-08-02 NOTE — Patient Instructions (Signed)
Please continue:  - Lantus 22 units at bedtime - Humalog:  10 units with a small meal 15 units with a larger meal - Metformin ER 500 mg 2x a day.  Please return in 3 months with your sugar log.  

## 2015-08-02 NOTE — Progress Notes (Signed)
Patient ID: Nicole Strong, female   DOB: [birth year], 65 y.o.   MRN: [id number]  HPI: Nicole Strong is a 65 y.o.-year-old female, returning for f/u for DM2, dx in ~2009, insulin-dependent, uncontrolled, with complications (CKD). Last visit 3.5 mo ago.  Last hemoglobin A1c was: Lab Results  Component Value Date   HGBA1C 9.0 03/07/2015   HGBA1C 8.1 10/08/2014   HGBA1C 7.6* 07/08/2014   Pt is on a regimen of: - Lantus 18 >> 22 units at bedtime  - Humalog:  10 units with a small meal 15 units with a larger meal - Metformin ER 500 mg 2x a day  She tried regular Metformin >> diarrhea >> had to stop it She tried Januvia >> had diarrhea from it. She tried Tradjenta >> could not afford it, also blocked the abs of her other meds. She tried Wechol 1000 mg po bid >> could not afford it She tried Actos >> sugars better, but had bladder cancer We stopped Starlix 120 mg 3x a day with meals.  Pt checks her sugars 1x a day and they are much better - forgot log: - am: 200s. 300 this am (Glucerna last night) >> 130-180, 205 >> 136-185 >> 100-140s >> <200 >> 145-171, 185 >> 120-140 - 2h after b'fast: n/c >> 181-205 >> 173-219 >> n/c >> 200-260 >> 188, 199, 215 >> n/c - before lunch: 200s >> n/c >> 180 >> n/c  - 2h after lunch: n/c >> n/c >> 151-199 >> n/c - before dinner: 200s >> 121-199 >> 106x1, 186-215 >> n/c >> 200-230 >> 148-185 >> 130-140, 150 - 2h after dinner: n/c >> 158-205 >> 188, 204 >> n/c >> 165-242 >> 140-160 - bedtime: n/c >> 192-312 >> 180-213, 283 >> 180-198 >> 200-300 >> 195-218, 299 >> 140-160 - nighttime: n/c >> 121-208 >> 149-186 >> n/c No lows. Lowest sugar was 200 >> 130 >> 103 >> 107; she has hypoglycemia awareness at 200.  Highest sugar was >600 - ED visit >> 300s >> 286 x1 >> low 200 few times >> 300s  Glucometer: Freestyle Lite  Pt's meals are: - Breakfast: eggs + ham + toast b/c gastroparesis - Lunch >> mostly brunch: 1/2 sandwich - Dinner: salad or TV dinner or egg +  English muffin - Snacks: cheese, PB crackers No sodas  - + CKD, last BUN/creatinine:  Lab Results  Component Value Date   BUN 24* 06/07/2015   CREATININE 1.31* 06/07/2015  She had L nephrectomy 2011. - last set of lipids: Lab Results  Component Value Date   CHOL 127 04/29/2014   HDL 53.10 04/29/2014   LDLCALC 46 04/29/2014   LDLDIRECT 210.4 12/31/2013   TRIG 141.0 04/29/2014   CHOLHDL 2 04/29/2014   - last eye exam was in 06/2015 >> will also se retina specialist. Recent io injection. Has double vision from GBS. - + numbness and tingling in her feet.  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 In 2011.  She had pancreatitis >> ED 03/15/2014. On 04/06/2014 >> went to ED with Hgly , CBG >600.  Started Uloric and eye drops since last visit.  ROS: Constitutional: + weight gain, no fatigue, no subjective hypothermia Eyes: no blurry vision, no xerophthalmia ENT: no sore throat, no nodules palpated in throat, no dysphagia/odynophagia, no hoarseness Cardiovascular: no CP/SOB/palpitations/leg swelling Respiratory: no cough/no SOB Gastrointestinal: no N/V/D/C/acid reflux Musculoskeletal: no muscle/no joint aches Skin: no rashes Neurological: no tremors/numbness/tingling/dizziness  I reviewed pt's  medications, allergies, PMH, social hx, family hx, and changes were documented in the history of present illness. Otherwise, unchanged from my initial visit note:  Past Medical History  Diagnosis Date  . Diabetes mellitus     2  . Hyperlipidemia   . Hypertension   . Renal mass     BEING WORKED UP BY UROLOGY  . 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 Guillain-Barre syndrome ([place])   . Renal disorder     L kidney removed  . Cancer ([place])   . 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 Cesarean section    . Cholecystectomy    . Nephrectomy     History   Social History  .  Marital Status: Divorced    Spouse Name: N/A    Number of Children: 1  . Years of Education: 14   Occupational History  .      disabled   Social History Main Topics  . Smoking status: Former Research scientist (life sciences)  . Smokeless tobacco: Never Used  . Alcohol Use: No  . Drug Use: No   Social History Narrative   Patient lives alone.    Disabled.   Education college education.   Right handed.   Caffeine. Rare one cup.   Current Outpatient Prescriptions on File Prior to Visit  Medication Sig Dispense Refill  . amitriptyline (ELAVIL) 10 MG tablet TAKE 4 TABS AT BEDTIME  FOR 1 WEEK THEN INCREASE TO 5 AT BEDTIME IF NECESSARY 150 tablet 3  . aspirin EC 81 MG tablet Take 81 mg by mouth daily.    . calcium citrate (CALCITRATE - DOSED IN MG ELEMENTAL CALCIUM) 950 MG tablet Take 200 mg of elemental calcium by mouth daily.    . cholecalciferol (VITAMIN D) 1000 UNITS tablet Take 5,000 Units by mouth daily.    . COD LIVER OIL PO Take 1 capsule by mouth daily.    Marland Kitchen gabapentin (NEURONTIN) 300 MG capsule TAKE 1 CAPSULE (300 MG TOTAL) BY MOUTH 4 (FOUR) TIMES DAILY. 120 capsule 3  . linagliptin (TRADJENTA) 5 MG TABS tablet Take 1 tablet (5 mg total) by mouth daily. 30 tablet 2  . lisinopril (PRINIVIL,ZESTRIL) 20 MG tablet Take 20 mg by mouth daily.    . metFORMIN (GLUCOPHAGE) 1000 MG tablet TAKE 1 TABLET (1,000 MG TOTAL) BY MOUTH 2 (TWO) TIMES DAILY WITH A MEAL. OFFICE VISIT DUE FOR MORE REFILLS. 60 tablet 0  . metoCLOPramide (REGLAN) 10 MG tablet Take 1 tablet (10 mg total) by mouth 4 (four) times daily -  before meals and at bedtime. 120 tablet 2  . nateglinide (STARLIX) 60 MG tablet Take 1 tablet (60 mg total) by mouth 3 (three) times daily with meals. 90 tablet 2  . promethazine (PHENERGAN) 25 MG tablet TAKE 1 TABLET (25 MG TOTAL) BY MOUTH AS NEEDED as directed by dr dohmeier  FOR NAUSEA. 90 tablet 1  . simvastatin (ZOCOR) 40 MG tablet Take 1 tablet (40 mg total) by mouth at bedtime. 30 tablet 2  . traMADol  (ULTRAM) 50 MG tablet Take 50-100 mg by mouth every 6 (six) hours as needed for moderate pain. Take 1 tablet in the morning and 2 tablets at night     No current facility-administered medications on file prior to visit.   Allergies  Allergen Reactions  . Biaxin [Clarithromycin] Other (See Comments)    "face turns red"  . Codeine  Nausea And Vomiting and Other (See Comments)    Hallucinations  . Erythromycin Nausea And Vomiting  . Januvia [Sitagliptin]   . Prednisone     Mania.  . Augmentin [Amoxicillin-Pot Clavulanate] Diarrhea   Family History  Problem Relation Age of Onset  . Cancer Father     PROSTATE  . Cancer Sister     BREAST  . Healthy Mother    PE: BP 128/82 mmHg  Pulse 70  Ht   Wt   SpO2 94% There is no weight on file to calculate BMI. - refused to be weighed - in wheelchair  Wt Readings from Last 3 Encounters:  06/07/15 204 lb (92.534 kg)  04/21/15 215 lb (97.523 kg)  02/25/15 204 lb (92.534 kg)   Constitutional: overweight, in NAD, in wheelchair Eyes: PERRLA, EOMI, no exophthalmos ENT: moist mucous membranes, no thyromegaly, no cervical lymphadenopathy Cardiovascular: RRR, No MRG Respiratory: CTA B Gastrointestinal: abdomen soft, NT, ND, BS+ Musculoskeletal: no deformities, strength intact in all 4 Skin: dry, warm, no rashes Neurological: no tremor with outstretched hands, DTR not elicited  ASSESSMENT: 1. DM2, insulin-dependent, uncontrolled, with complications - CKD  She has Gastroparesis (self-dx) 2/2 GB.  PLAN:  1. Patient with long-standing, uncontrolled diabetes, on oral antidiabetic regimen + basal-bolus regimen and Metformin. We increased the Lantus dose at last visit >>sugars much better - reviewed last HbA1c >> 9.0% (higher) >> new HbA1c >> 7.4% (much better) - I suggested to:  Patient Instructions  Please continue:  - Lantus 22 units at bedtime - Humalog:  10 units with a small meal 15 units with a larger meal - Metformin ER 500 mg 2x a  day.  Please return in 3 months with your sugar log.   - continue checking sugars at different times of the day - check 2-3 times a day, rotating checks - advised for yearly eye exams >> she is UTD >> will ask retina specialist to send me the report - Return to clinic in 3 mo with sugar log

## 2015-08-05 ENCOUNTER — Encounter: Payer: Self-pay | Admitting: Internal Medicine

## 2015-08-22 ENCOUNTER — Other Ambulatory Visit: Payer: Self-pay | Admitting: Family Medicine

## 2015-08-29 ENCOUNTER — Other Ambulatory Visit: Payer: Self-pay | Admitting: Internal Medicine

## 2015-08-30 ENCOUNTER — Other Ambulatory Visit: Payer: Self-pay | Admitting: Neurology

## 2015-09-04 ENCOUNTER — Other Ambulatory Visit: Payer: Self-pay | Admitting: Family Medicine

## 2015-09-24 ENCOUNTER — Other Ambulatory Visit: Payer: Self-pay | Admitting: Family Medicine

## 2015-09-26 ENCOUNTER — Other Ambulatory Visit: Payer: Self-pay | Admitting: Internal Medicine

## 2015-10-01 ENCOUNTER — Other Ambulatory Visit: Payer: Self-pay | Admitting: Neurology

## 2015-10-01 DIAGNOSIS — G6189 Other inflammatory polyneuropathies: Secondary | ICD-10-CM

## 2015-10-01 DIAGNOSIS — G6181 Chronic inflammatory demyelinating polyneuritis: Secondary | ICD-10-CM

## 2015-10-01 DIAGNOSIS — E1340 Other specified diabetes mellitus with diabetic neuropathy, unspecified: Secondary | ICD-10-CM

## 2015-10-24 ENCOUNTER — Other Ambulatory Visit: Payer: Self-pay | Admitting: Family Medicine

## 2015-10-24 ENCOUNTER — Other Ambulatory Visit: Payer: Self-pay

## 2015-10-24 MED ORDER — INSULIN GLARGINE 100 UNIT/ML SOLOSTAR PEN: 22.0000 [IU] | PEN_INJECTOR | Freq: Every day | SUBCUTANEOUS | 1 refills | Status: DC

## 2015-11-03 ENCOUNTER — Ambulatory Visit: Payer: Medicare Other | Admitting: Internal Medicine

## 2015-11-03 ENCOUNTER — Other Ambulatory Visit: Payer: Self-pay | Admitting: Family Medicine

## 2015-11-11 ENCOUNTER — Encounter: Payer: Self-pay | Admitting: Neurology

## 2015-12-03 ENCOUNTER — Other Ambulatory Visit: Payer: Self-pay | Admitting: Family Medicine

## 2015-12-19 ENCOUNTER — Encounter: Payer: Self-pay | Admitting: Internal Medicine

## 2015-12-19 ENCOUNTER — Ambulatory Visit (INDEPENDENT_AMBULATORY_CARE_PROVIDER_SITE_OTHER): Payer: Medicare Other | Admitting: Internal Medicine

## 2015-12-19 VITALS — BP 122/78 | HR 80

## 2015-12-19 DIAGNOSIS — Z794 Long term (current) use of insulin: Secondary | ICD-10-CM | POA: Diagnosis not present

## 2015-12-19 DIAGNOSIS — N182 Chronic kidney disease, stage 2 (mild): Secondary | ICD-10-CM

## 2015-12-19 DIAGNOSIS — E1122 Type 2 diabetes mellitus with diabetic chronic kidney disease: Secondary | ICD-10-CM | POA: Diagnosis not present

## 2015-12-19 DIAGNOSIS — IMO0002 Reserved for concepts with insufficient information to code with codable children: Secondary | ICD-10-CM

## 2015-12-19 DIAGNOSIS — E1165 Type 2 diabetes mellitus with hyperglycemia: Secondary | ICD-10-CM | POA: Diagnosis not present

## 2015-12-19 LAB — POCT GLYCOSYLATED HEMOGLOBIN (HGB A1C): Hemoglobin A1C: 8.1

## 2015-12-19 MED ORDER — INSULIN GLARGINE 100 UNIT/ML SOLOSTAR PEN: 24.0000 [IU] | PEN_INJECTOR | Freq: Every day | SUBCUTANEOUS | 5 refills | Status: DC

## 2015-12-19 MED ORDER — INSULIN LISPRO 100 UNIT/ML (KWIKPEN): 14.0000 [IU] | PEN_INJECTOR | Freq: Three times a day (TID) | SUBCUTANEOUS | 5 refills | Status: DC

## 2015-12-19 NOTE — Patient Instructions (Addendum)
Please increase:  - Lantus to 24 units at bedtime - Humalog:  14 units with a smaller meal 18 units with a larger meal  Continue: - Metformin ER 500 mg 2x a day.  Please return in 3 months with your sugar log.

## 2015-12-19 NOTE — Addendum Note (Signed)
Addended by: Caprice Beaver T on: 12/19/2015 02:04 PM   Modules accepted: Orders

## 2015-12-19 NOTE — Progress Notes (Signed)
Patient ID: Nicole Strong, female   DOB: February 19, 1950, 65 y.o.   MRN: [id number]  HPI: Nicole Strong is a 65 y.o.-year-old female, returning for f/u for DM2, dx in ~2009, insulin-dependent, uncontrolled, with complications (CKD). Last visit 4.5 mo ago.  She has a lot of stress with mother being sick (dementia) and on hospice. She is depressed. She fell recently >> no fractures.   Last hemoglobin A1c was: Lab Results  Component Value Date   HGBA1C 7.4 08/02/2015   HGBA1C 9.0 03/07/2015   HGBA1C 8.1 10/08/2014   Pt is on a regimen of: - Lantus 18 >> 22 units at bedtime  - Humalog:  10 units with a small meal 15 units with a larger meal - Metformin ER 500 mg 2x a day  She tried regular Metformin >> diarrhea >> had to stop it She tried Januvia >> had diarrhea from it. She tried Tradjenta >> could not afford it, also blocked the abs of her other meds. She tried Wechol 1000 mg po bid >> could not afford it She tried Actos >> sugars better, but had bladder cancer We stopped Starlix 120 mg 3x a day with meals.  Pt checks her sugars 1x a day and they are - forgot log: - am:130-180, 205 >> 136-185 >> 100-140s >> <200 >> 145-171, 185 >> 120-140 >> 117-172, 181 - 2h after b'fast: n/c >> 181-205 >> 173-219 >> n/c >> 200-260 >> 188, 199, 215 >> n/c >> 210-218 - before lunch: 200s >> n/c >> 180 >> n/c >> 153, 168 - 2h after lunch: n/c >> n/c >> 151-199 >> n/c >> 196 - before dinner: 200s >> 121-199 >> 106x1, 186-215 >> n/c >> 200-230 >> 148-185 >> 130-140, 150  >> 143-158 - 2h after dinner: n/c >> 158-205 >> 188, 204 >> n/c >> 165-242 >> 140-160 >> 192-200, 212 - bedtime: n/c >> 192-312 >> 180-213, 283 >> 180-198 >> 200-300 >> 195-218, 299 >> 140-160 >> 160 - nighttime: n/c >> 121-208 >> 149-186 >> n/c No lows. Lowest sugar was 107 >> 117; she has hypoglycemia awareness at 200.  Highest sugar was 300s >> 200s  Glucometer: Freestyle Lite  Pt's meals are: - Breakfast: eggs + ham + toast b/c  gastroparesis - Lunch >> mostly brunch: 1/2 sandwich - Dinner: salad or TV dinner or egg + English muffin - Snacks: cheese, PB crackers No sodas  - + CKD, last BUN/creatinine:  Lab Results  Component Value Date   BUN 24 (H) 06/07/2015   CREATININE 1.31 (H) 06/07/2015  She had L nephrectomy 2011. - last set of lipids: Lab Results  Component Value Date   CHOL 127 04/29/2014   HDL 53.10 04/29/2014   LDLCALC 46 04/29/2014   LDLDIRECT 210.4 12/31/2013   TRIG 141.0 04/29/2014   CHOLHDL 2 04/29/2014   - last eye exam was in 06/2015 >> sees retina specialist. Has double vision from GBS. - + numbness and tingling in her feet.  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 In 2011.  She had pancreatitis >> ED 03/15/2014. On 04/06/2014 >> went to ED with Hgly , CBG >600.  Started Uloric and eye drops since last visit.  ROS: Constitutional: + weight gain, no fatigue, no subjective hypothermia Eyes: + blurry vision, no xerophthalmia ENT: no sore throat, no nodules palpated in throat, no dysphagia/odynophagia, no hoarseness Cardiovascular: no CP/SOB/palpitations/leg swelling Respiratory: no cough/no SOB Gastrointestinal: no N/V/D/C/acid reflux Musculoskeletal: no muscle/no joint  aches Skin: no rashes Neurological: no tremors/numbness/tingling/dizziness, + HA  I reviewed pt's medications, allergies, PMH, social hx, family hx, and changes were documented in the history of present illness. Otherwise, unchanged from my initial visit note:  Past Medical History:  Diagnosis Date  . Cancer ([place])   . 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 Diabetes mellitus    2  . Guillain-Barre syndrome ([place])   . Hyperlipidemia   . Hypertension   . Renal disorder    L kidney removed  . Renal mass    BEING WORKED UP BY UROLOGY  . 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 CESAREAN SECTION    .  CHOLECYSTECTOMY    . NEPHRECTOMY     History   Social History  . Marital Status: Divorced    Spouse Name: N/A    Number of Children: 1  . Years of Education: 14   Occupational History  .      disabled   Social History Main Topics  . Smoking status: Former Research scientist (life sciences)  . Smokeless tobacco: Never Used  . Alcohol Use: No  . Drug Use: No   Social History Narrative   Patient lives alone.    Disabled.   Education college education.   Right handed.   Caffeine. Rare one cup.   Current Outpatient Prescriptions on File Prior to Visit  Medication Sig Dispense Refill  . amitriptyline (ELAVIL) 10 MG tablet TAKE 4 TABS AT BEDTIME  FOR 1 WEEK THEN INCREASE TO 5 AT BEDTIME IF NECESSARY 150 tablet 3  . aspirin EC 81 MG tablet Take 81 mg by mouth daily.    . calcium citrate (CALCITRATE - DOSED IN MG ELEMENTAL CALCIUM) 950 MG tablet Take 200 mg of elemental calcium by mouth daily.    . cholecalciferol (VITAMIN D) 1000 UNITS tablet Take 5,000 Units by mouth daily.    . COD LIVER OIL PO Take 1 capsule by mouth daily.    Marland Kitchen gabapentin (NEURONTIN) 300 MG capsule TAKE 1 CAPSULE (300 MG TOTAL) BY MOUTH 4 (FOUR) TIMES DAILY. 120 capsule 3  . linagliptin (TRADJENTA) 5 MG TABS tablet Take 1 tablet (5 mg total) by mouth daily. 30 tablet 2  . lisinopril (PRINIVIL,ZESTRIL) 20 MG tablet Take 20 mg by mouth daily.    . metFORMIN (GLUCOPHAGE) 1000 MG tablet TAKE 1 TABLET (1,000 MG TOTAL) BY MOUTH 2 (TWO) TIMES DAILY WITH A MEAL. OFFICE VISIT DUE FOR MORE REFILLS. 60 tablet 0  . metoCLOPramide (REGLAN) 10 MG tablet Take 1 tablet (10 mg total) by mouth 4 (four) times daily -  before meals and at bedtime. 120 tablet 2  . nateglinide (STARLIX) 60 MG tablet Take 1 tablet (60 mg total) by mouth 3 (three) times daily with meals. 90 tablet 2  . promethazine (PHENERGAN) 25 MG tablet TAKE 1 TABLET (25 MG TOTAL) BY MOUTH AS NEEDED as directed by dr dohmeier  FOR NAUSEA. 90 tablet 1  . simvastatin (ZOCOR) 40 MG tablet Take 1  tablet (40 mg total) by mouth at bedtime. 30 tablet 2  . traMADol (ULTRAM) 50 MG tablet Take 50-100 mg by mouth every 6 (six) hours as needed for moderate pain. Take 1 tablet in the morning and 2 tablets at night     No current facility-administered medications on file prior to visit.   Allergies  Allergen Reactions  . Biaxin [Clarithromycin] Other (See Comments)    "  face turns red"  . Codeine Nausea And Vomiting and Other (See Comments)    Hallucinations  . Erythromycin Nausea And Vomiting  . Januvia [Sitagliptin]   . Prednisone     Mania.  . Augmentin [Amoxicillin-Pot Clavulanate] Diarrhea   Family History  Problem Relation Age of Onset  . Cancer Father     PROSTATE  . Cancer Sister     BREAST  . Healthy Mother    PE: BP 122/78 (BP Location: Left Arm, Patient Position: Sitting)   Pulse 80  There is no height or weight on file to calculate BMI. - refused to be weighed - in wheelchair  Wt Readings from Last 3 Encounters:  06/07/15 204 lb (92.5 kg)  04/21/15 215 lb (97.5 kg)  02/25/15 204 lb (92.5 kg)   Constitutional: overweight, in NAD, in wheelchair Eyes: PERRLA, EOMI, no exophthalmos ENT: moist mucous membranes, no thyromegaly, no cervical lymphadenopathy Cardiovascular: RRR, No MRG Respiratory: CTA B Gastrointestinal: abdomen soft, NT, ND, BS+ Musculoskeletal: no deformities, strength intact in all 4 Skin: dry, warm, no rashes Neurological: no tremor with outstretched hands, DTR not elicited  ASSESSMENT: 1. DM2, insulin-dependent, uncontrolled, with complications - CKD  She has Gastroparesis (self-dx) 2/2 GB.  PLAN:  1. Patient with long-standing, uncontrolled diabetes, on oral antidiabetic regimen + basal-bolus regimen and Metformin. Her sugars are higher (dietary indiscretions) >> will increase insulins. - reviewed last HbA1c >> 7.4% (improved) - I suggested to:  Patient Instructions  Please continue:  - Lantus 22 units at bedtime - Humalog:  10 units  with a small meal 15 units with a larger meal - Metformin ER 500 mg 2x a day.  Please return in 3 months with your sugar log.   - continue checking sugars at different times of the day - check 2-3 times a day, rotating checks - advised for yearly eye exams >> she is UTD  - new HbA1c >> 8.1% (higher) - Return to clinic in 3 mo with sugar log   Philemon Kingdom, MD PhD [facility]

## 2015-12-24 ENCOUNTER — Other Ambulatory Visit: Payer: Self-pay | Admitting: Family Medicine

## 2015-12-26 NOTE — Telephone Encounter (Signed)
Last lipid panel March 2016, normal at that time. Last seen April 2017 for acute visit. No future appts scheduled. Please advise.

## 2015-12-28 ENCOUNTER — Other Ambulatory Visit: Payer: Self-pay | Admitting: Neurology

## 2015-12-29 ENCOUNTER — Ambulatory Visit: Payer: Medicare Other | Admitting: Adult Health

## 2015-12-29 ENCOUNTER — Encounter: Payer: Self-pay | Admitting: Neurology

## 2015-12-29 ENCOUNTER — Ambulatory Visit (INDEPENDENT_AMBULATORY_CARE_PROVIDER_SITE_OTHER): Payer: Medicare Other | Admitting: Neurology

## 2015-12-29 VITALS — BP 165/75 | HR 75

## 2015-12-29 DIAGNOSIS — E559 Vitamin D deficiency, unspecified: Secondary | ICD-10-CM | POA: Diagnosis not present

## 2015-12-29 DIAGNOSIS — G610 Guillain-Barre syndrome: Secondary | ICD-10-CM

## 2015-12-29 DIAGNOSIS — G378 Other specified demyelinating diseases of central nervous system: Secondary | ICD-10-CM | POA: Diagnosis not present

## 2015-12-29 DIAGNOSIS — M85851 Other specified disorders of bone density and structure, right thigh: Secondary | ICD-10-CM

## 2015-12-29 DIAGNOSIS — G6181 Chronic inflammatory demyelinating polyneuritis: Secondary | ICD-10-CM

## 2015-12-29 DIAGNOSIS — M85852 Other specified disorders of bone density and structure, left thigh: Secondary | ICD-10-CM

## 2015-12-29 MED ORDER — VITAMIN D3 1.25 MG (50000 UT) PO CAPS: 50000.0000 [IU] | ORAL_CAPSULE | ORAL | 3 refills | Status: AC

## 2015-12-29 MED ORDER — GABAPENTIN 300 MG PO CAPS: ORAL_CAPSULE | ORAL | 3 refills | Status: DC

## 2015-12-29 MED ORDER — TRAMADOL HCL 50 MG PO TABS: ORAL_TABLET | ORAL | 3 refills | Status: DC

## 2015-12-29 NOTE — Progress Notes (Signed)
Provider:  [name], M D  Referring Provider: Carollee Herter, Alferd Apa, * Primary Care Physician:  Ann Held, DO  Chief Complaint  Patient presents with  . Follow-up    things are going well    HPI:  Nicole Strong is a 65 y.o. female , seen here as a  revisit from Dr. 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  In 2015 Mrs. Roszak underwent an EMG with nerve conduction study which I basically order to qualify her for IV Ig therapy. There were other obstacles to IVIG as she has only one kidney. However the nerve conduction study and EMG performed by Dr. Jannifer Franklin documented axonal peripheral nerve injury and not demyelinating. 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 She seems to have had the axonal form of Guillain-Barr which has a very slow recovery rate at often incomplete recovery. It is also more prone to have pain involvement. IVIG was for this reason not longer considered a feasible treatment option. At this time there is no specific treatment for axonal neuropathies except moderate diet moderate exercise lifestyle changes that will promote healthy nerves. 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 Follow for diabetes hopefully also will improve her overall neurologic health. She continues to gain weight on insulin which is common. She has struggled to visit the Guillain-Barr syndrome and aftermath now for over 4 years and had excessive weight gain due to inactivity.  Interval history 12-29-14, The patient reports stabilized but not improving ambulation. She is able to ambulate through her small apartment , but sits in her wheelchair for some manual activities. She lives alone.  The patient reminded me that she suffered a head injury in 2004 she ended up being disabled for on  short-term disability and was finally laid off her job. She worked at [employer]. The job was emotionally and physically stressful and depended on her productivity is beating fast and quick responding. 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   Review of Systems: Out of a complete 14 system review, the patient complains of only the following symptoms, and all other reviewed systems are negative. Pain, weight gain, diabetes,   Social History   Social History  . Marital status: Divorced    Spouse name: N/A  . Number of children: 1  . Years of education: 50   Occupational History  .      disabled   Social History Main Topics  . Smoking status: Former Research scientist (life sciences)  . Smokeless tobacco: Never Used  . Alcohol use No  . Drug use: No  . Sexual activity: Not on file   Other Topics Concern  . Not on file   Social History Narrative   Patient lives at home alone.    Disabled.   Education college education.   Right handed.   Caffeine. Rare one cup.    Family History  Problem Relation Age of Onset  . Cancer Father     PROSTATE  . Cancer Sister     BREAST  . Healthy Mother     Past Medical History:  Diagnosis Date  . Cancer ([place])   . 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 Diabetes mellitus    2  . Guillain-Barre syndrome ([place])   . Hyperlipidemia   . Hypertension   .  Renal disorder    L kidney removed  . Renal mass    BEING WORKED UP BY UROLOGY  . 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 CESAREAN SECTION    . CHOLECYSTECTOMY    . NEPHRECTOMY      Current Outpatient Prescriptions  Medication Sig Dispense Refill  . albuterol (VENTOLIN HFA) 108 (90 BASE) MCG/ACT inhaler Inhale 2 puffs into the lungs every 6 (six) hours as needed for wheezing or shortness of breath. 1 Inhaler 1  . amitriptyline (ELAVIL) 10 MG tablet TAKE 4 TABLETS (40 MG TOTAL) BY MOUTH AT BEDTIME. 120  tablet 4  . aspirin EC 81 MG tablet Take 81 mg by mouth daily.    Marland Kitchen atorvastatin (LIPITOR) 10 MG tablet TAKE ONE TABLET BY MOUTH 3 TIMES A WEEKLY 12 tablet 0  . azelastine (ASTELIN) 0.1 % nasal spray Place 2 sprays into both nostrils 2 (two) times daily. Use in each nostril as directed 30 mL 12  . BD PEN NEEDLE NANO U/F 32G X 4 MM MISC USE 1X A DAY 100 each 1  . Cholecalciferol (VITAMIN D3) 5000 UNITS CAPS Take 50,000 Units by mouth once a week.     . COD LIVER OIL PO Take 1 capsule by mouth daily.    Marland Kitchen FREESTYLE LITE test strip USE TO TEST BLOOD SUGAR 2 TIMES DAILY AS INSTRUCTED. DX CODE: E11.29 100 each 11  . gabapentin (NEURONTIN) 300 MG capsule TAKE 1 CAPSULE BY MOUTH IN THE MORNING, 2 CAPSULES AT LUNCH, 1 CAPSULE IN THE AFTERNOON. AND 2 CAPSULES AT BEDTIME 180 capsule 3  . glucose blood (FREESTYLE LITE) test strip Use to test blood sugar 2 times daily as instructed. Dx code: E11.29 100 each 11  . Insulin Glargine (LANTUS SOLOSTAR) 100 UNIT/ML Solostar Pen Inject 24 Units into the skin daily at 10 pm. 5 pen 5  . insulin lispro (HUMALOG KWIKPEN) 100 UNIT/ML KiwkPen Inject 0.14-0.18 mLs (14-18 Units total) into the skin 3 (three) times daily. 15 pen 5  . ipratropium (ATROVENT HFA) 17 MCG/ACT inhaler Inhale 2 puffs into the lungs every 6 (six) hours as needed for wheezing. 1 Inhaler 2  . lisinopril (PRINIVIL,ZESTRIL) 20 MG tablet Take 20 mg by mouth at bedtime.     . metFORMIN (GLUCOPHAGE-XR) 500 MG 24 hr tablet TAKE 1 TABLET (500 MG TOTAL) BY MOUTH 2 (TWO) TIMES DAILY. 60 tablet 2  . mometasone (ELOCON) 0.1 % cream Apply 1 application topically daily. 45 g 1  . oxyCODONE-acetaminophen (PERCOCET/ROXICET) 5-325 MG per tablet Take 1 tablet by mouth every 4 (four) hours as needed. 15 tablet 0  . promethazine (PHENERGAN) 25 MG tablet Take 1 tablet (25 mg total) by mouth every 8 (eight) hours as needed for nausea or vomiting. 90 tablet 3  . silver sulfADIAZINE (SILVADENE) 1 % cream Apply 1 application  topically daily. 50 g 0  . traMADol (ULTRAM) 50 MG tablet Take 1 tablet in the morning and 2 tablets at night 90 tablet 3  . ULORIC 40 MG tablet TAKE ONE TABLET BY MOUTH DAILY. 30 tablet 5   No current facility-administered medications for this visit.     Allergies as of 12/29/2015 - Review Complete 12/29/2015  Allergen Reaction Noted  . Biaxin [clarithromycin] Other (See Comments) 05/31/2012  . Codeine Nausea And Vomiting and Other (See Comments) 10/19/2009  . Erythromycin Nausea And Vomiting 06/17/2012  . Influenza vaccines  12/19/2015  . Januvia [sitagliptin]  06/22/2014  . Prednisone  04/06/2014  . Augmentin [amoxicillin-pot clavulanate] Diarrhea 08/02/2015    Vitals: BP (!) 165/75   Pulse 75  Last Weight:  Wt Readings from Last 1 Encounters:  06/07/15 204 lb (92.5 kg)   Last Height:   Ht Readings from Last 1 Encounters:  06/28/15 5\' 1"  (1.549 m)    Physical exam:  General: The patient is awake, alert and appears not in acute distress. The patient is well groomed. Head: Normocephalic, atraumatic.  Neck is short but supple. Mallampati 3 , neck circumference: 15.5 "  Cardiovascular:  Regular rate and rhythm , without  murmurs or carotid bruit, and without distended neck veins. Respiratory: Lungs are clear to auscultation. Skin:  Without evidence of edema, , she has rosacea. Skin changes visible over the lower arms and blotches.  Low skin turgor.  Trunk: BMI is severly elevated.  Neurologic exam : The patient is awake and alert, oriented to place and time.  Memory subjective described as intact.  There is a normal attention span & concentration ability.  Speech is fluent without dysarthria, dysphonia or aphasia. Mood and affect are appropriate.  Cranial nerves: Pupils are equal and briskly reactive to light.   Extraocular movements  in vertical and horizontal planes intact and without nystagmus.  Visual fields by finger perimetry are intact. Hearing to finger rub  intact.  Facial sensation intact to fine touch. Facial motor strength is symmetric and tongue and uvula move midline. Tongue protrusion into either cheek is normal. Shoulder shrug is weak-  Hip flexion is very weak.    Motor exam:  Decreased muscle tone in all extremities, loss of mass. Her grip strength is weak ,  she cannot maintain a strong grip. She continues to have spinal and lower back pain and most of her strength loss is evident from the navel down involving both lower extremities.  This is commonly a pattern of Guillain-Barr.   I was able to elicit deep tendon reflexes in her upper extremities. These are not preserved in her lower extremities. Sensory:  Fine touch, pinprick and vibration were tested in all extremities- sensory loss covered by hyperesthesia. Proprioception ab-normal.   Coordination: Rapid alternating movements in the fingers/hands were normal.  Finger-to-nose maneuver  normal without evidence of  tremor.  Gait and station: Patient in wheelchair, able to stand only with assistance.   She can walk with a walker on an even surface. She cannot manage an incline.  Strength abnormal,  limits. Stance is instable  Tandem gait deferred.    Deep tendon reflexes: in the upper and lower extremities are symmetric and intact. Babinski maneuver response is downgoing.  Assessment:  After physical and neurologic examination, review of laboratory studies, imaging, neurophysiology testing and pre-existing records, assessment is that of :   Axonal neuropathy with onset in form of GuillanAris Lot in December 2011 -2012. Dr. Doy Mince followed initially.   The patient never recovered fully, but has not longer progressed.   Spinal tap had been performed in the hospital but EMG and nerve conduction studies were not performed until earlier in 2016 .    Spells :  Loss of awareness was not part of it. She described a wave coming over her. She describes this as if being electrified from the  feet up. She felt like being electrocuted. Her hands shook . His occurred while in bed and reclined. The patient describes  electric shock sensations in both upper extremities,  and sometimes in her feet- ' a lightning bolt "  .   Osteopenia: continue on Vit D 3 per orthopedist  .     Plan:  Treatment plan and additional workup :  Axonal polyneuropathy with ascending pattern, subtype of Guillain-Barr, not qualifying as CIDP and therefore not treatable with IVIG.   IVIG was discontinued after the axonal nature of her disease was confirmed.  Main management is physical therapy diet adjustment, and pain therapy as needed. Tramadol. She has only one kidney, we have to keep that in mind.   Seizure medications are already in place , mainly to treat the neuropathic pain and back pain. Neurontin will be continued- Creatinine needs to be checked q 6 month. Asencion Partridge Tabetha Haraway MD 12/29/2015

## 2016-01-07 ENCOUNTER — Encounter: Payer: Self-pay | Admitting: Family Medicine

## 2016-01-19 ENCOUNTER — Encounter: Payer: Self-pay | Admitting: Family Medicine

## 2016-01-20 ENCOUNTER — Emergency Department (HOSPITAL_COMMUNITY)
Admission: EM | Admit: 2016-01-20 | Discharge: 2016-01-21 | Disposition: A | Discharge status: Home / Self Care | Payer: Medicare Other | Attending: Emergency Medicine | Admitting: Emergency Medicine

## 2016-01-20 ENCOUNTER — Encounter (HOSPITAL_COMMUNITY): Payer: Self-pay | Admitting: Emergency Medicine

## 2016-01-20 ENCOUNTER — Emergency Department (HOSPITAL_COMMUNITY): Payer: Medicare Other

## 2016-01-20 ENCOUNTER — Other Ambulatory Visit: Payer: Self-pay | Admitting: Family Medicine

## 2016-01-20 DIAGNOSIS — R42 Dizziness and giddiness: Secondary | ICD-10-CM | POA: Insufficient documentation

## 2016-01-20 DIAGNOSIS — Z79899 Other long term (current) drug therapy: Secondary | ICD-10-CM | POA: Insufficient documentation

## 2016-01-20 DIAGNOSIS — E1140 Type 2 diabetes mellitus with diabetic neuropathy, unspecified: Secondary | ICD-10-CM | POA: Diagnosis not present

## 2016-01-20 DIAGNOSIS — H8110 Benign paroxysmal vertigo, unspecified ear: Secondary | ICD-10-CM

## 2016-01-20 DIAGNOSIS — Z7984 Long term (current) use of oral hypoglycemic drugs: Secondary | ICD-10-CM | POA: Diagnosis not present

## 2016-01-20 DIAGNOSIS — I10 Essential (primary) hypertension: Secondary | ICD-10-CM | POA: Diagnosis not present

## 2016-01-20 LAB — URINALYSIS, ROUTINE W REFLEX MICROSCOPIC
BILIRUBIN URINE: NEGATIVE
Bacteria, UA: NONE SEEN
HGB URINE DIPSTICK: NEGATIVE
Ketones, ur: NEGATIVE mg/dL
Nitrite: NEGATIVE
SPECIFIC GRAVITY, URINE: 1.016 (ref 1.005–1.030)
pH: 6 (ref 5.0–8.0)

## 2016-01-20 LAB — CBC
HCT: 34.3 % — ABNORMAL LOW (ref 36.0–46.0)
HEMOGLOBIN: 12 g/dL (ref 12.0–15.0)
MCH: 30 pg (ref 26.0–34.0)
MCHC: 35 g/dL (ref 30.0–36.0)
MCV: 85.8 fL (ref 78.0–100.0)
PLATELETS: 229 10*3/uL (ref 150–400)
RBC: 4 MIL/uL (ref 3.87–5.11)
RDW: 13.9 % (ref 11.5–15.5)
WBC: 7.2 10*3/uL (ref 4.0–10.5)

## 2016-01-20 LAB — CBG MONITORING, ED: GLUCOSE-CAPILLARY: 176 mg/dL — AB (ref 65–99)

## 2016-01-20 LAB — BASIC METABOLIC PANEL
ANION GAP: 7 (ref 5–15)
BUN: 30 mg/dL — ABNORMAL HIGH (ref 6–20)
CHLORIDE: 103 mmol/L (ref 101–111)
CO2: 28 mmol/L (ref 22–32)
Calcium: 9.5 mg/dL (ref 8.9–10.3)
Creatinine, Ser: 1.62 mg/dL — ABNORMAL HIGH (ref 0.44–1.00)
GFR calc non Af Amer: 32 mL/min — ABNORMAL LOW (ref >60)
GFR, EST AFRICAN AMERICAN: 37 mL/min — AB (ref >60)
Glucose, Bld: 166 mg/dL — ABNORMAL HIGH (ref 65–99)
POTASSIUM: 3.9 mmol/L (ref 3.5–5.1)
SODIUM: 138 mmol/L (ref 135–145)

## 2016-01-20 MED ORDER — MECLIZINE HCL 25 MG PO TABS
12.5000 mg | ORAL_TABLET | Freq: Once | ORAL | Status: AC
  Administered 2016-01-20: 12.5 mg via ORAL
  Filled 2016-01-20: qty 1

## 2016-01-20 MED ORDER — SODIUM CHLORIDE 0.9 % IV BOLUS (SEPSIS)
1000.0000 mL | Freq: Once | INTRAVENOUS | Status: AC
  Administered 2016-01-20: 1000 mL via INTRAVENOUS

## 2016-01-20 MED ORDER — ONDANSETRON HCL 4 MG/2ML IJ SOLN
4.0000 mg | Freq: Once | INTRAMUSCULAR | Status: AC
  Administered 2016-01-20: 4 mg via INTRAVENOUS
  Filled 2016-01-20: qty 2

## 2016-01-20 MED ORDER — LORAZEPAM 2 MG/ML IJ SOLN
0.5000 mg | Freq: Once | INTRAMUSCULAR | Status: AC
  Administered 2016-01-21: 0.5 mg via INTRAVENOUS
  Filled 2016-01-20: qty 1

## 2016-01-20 MED ORDER — SODIUM CHLORIDE 0.9 % IV BOLUS (SEPSIS): 500.0000 mL | Freq: Once | INTRAVENOUS | Status: DC

## 2016-01-20 MED ORDER — MECLIZINE HCL 25 MG PO TABS: 12.5000 mg | ORAL_TABLET | Freq: Once | ORAL | Status: DC

## 2016-01-20 NOTE — ED Triage Notes (Signed)
Patient reports dizziness x6 days. Increases with movement. Denies SOB and chest pain. Hx guillain-barre

## 2016-01-20 NOTE — ED Notes (Signed)
Pt. In MRI.

## 2016-01-20 NOTE — ED Provider Notes (Signed)
[facility] DEPT Provider Note   CSN: [id number] Arrival date & time: 01/20/16  1543     History   Chief Complaint Chief Complaint  Patient presents with  . Dizziness    HPI Nicole Strong is a 65 y.o. female.  Nicole Strong is a 65 y.o. female with history of axonal GBS, DM, HTN ,HLD, obesity, gait disorder, neuropathy, chronic low back pain presents to ED with complaint of dizziness. Patient reports onset of dizziness approximately 6 days. Feels as though the "room is spinning," intermittent, lasting only a few minutes, initially with turning head to right, now with turning head left and right and with poisitional changes. She does report some nausea that started today, although attributes that to getting bad news. Patient reports chronic diplopia. She also reports chronic numbness secondary to GBS and DM. She has chronic low back pain. Pt is predominantly wheelchair bound, states she can walk a few steps but generally has to hold onto something at baseline. She denies any new numbness or weakness. No facial droop, slurred speech, fever, URI sxs, ear pain, tinnitus, CP, shortness of breath, abdominal pain, vomiting, diarrhea, or rash. No recent head trauma. No treatments tried PTA.       Past Medical History:  Diagnosis Date  . Cancer ([place])   . 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 Diabetes mellitus    2  . Guillain-Barre syndrome ([place])   . Hyperlipidemia   . Hypertension   . Renal disorder    L kidney removed  . Renal mass    BEING WORKED UP BY UROLOGY  . 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 Axonal GBS (Guillain-Barre syndrome) ([place]) 12/29/2015  . Vitamin D deficiency 12/29/2015  . Pain in toe of left foot 06/12/2015  . Uncontrolled type 2 diabetes mellitus with stage 2 chronic kidney disease, with long-term current use of insulin ([place]) 04/21/2015  . Neuropathy due to secondary  diabetes ([place]) 12/29/2014  . CIDP (chronic inflammatory demyelinating polyneuropathy) ([place]) 12/29/2014  . Diabetes 1.5, managed as type 2 ([place]) 06/23/2014  . 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 Enteritis due to Clostridium difficile 08/15/2013  . ARF (acute renal failure) ([place]) 08/14/2013  . Acute renal failure ([place]) 08/14/2013  . Obesity (BMI 30-39.9) 11/19/2012  . Single kidney   . Diarrhea 05/31/2012  . Dehydration 05/31/2012  . Weakness generalized 05/31/2012  . Hyponatremia 05/31/2012  . Gait disorder 05/19/2012  . 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 NEUROPATHY 01/26/2010  . BACK PAIN, LUMBAR, WITH RADICULOPATHY 01/26/2010  . NAUSEA WITH VOMITING 01/26/2010  . HYPERLIPIDEMIA 10/19/2009  . HYPERTENSION 10/19/2009  . CARDIAC MURMUR 10/19/2009  . POSTMENOPAUSAL STATUS 10/19/2009    Past Surgical History:  Procedure Laterality Date  . CESAREAN SECTION    . CHOLECYSTECTOMY    . NEPHRECTOMY      OB History    No data available       Home Medications    Prior to Admission medications   Medication Sig Start Date End Date Taking? Authorizing Provider  albuterol (VENTOLIN HFA) 108 (90 BASE) MCG/ACT inhaler Inhale 2 puffs into the lungs every 6 (six) hours as needed for wheezing or shortness of breath. 08/18/14  Yes Colon Branch, MD  amitriptyline (ELAVIL) 10 MG tablet TAKE 4 TABLETS (40 MG TOTAL) BY MOUTH AT BEDTIME. 10/03/15  Yes [name], MD  atorvastatin (LIPITOR) 10 MG tablet TAKE ONE TABLET BY MOUTH 3 TIMES A WEEKLY Patient taking differently: TAKE 10 MG BY MOUTH ON TUESDAY, THURSDAY, AND SATURDAY 12/26/15  Yes Ann Held, DO  BD PEN NEEDLE NANO U/F 32G X 4 MM MISC USE 1X A DAY 08/29/15  Yes Philemon Kingdom, MD  Cholecalciferol (VITAMIN D3) 50000 units CAPS Take 50,000 Units by mouth once a week. 12/29/15  Yes Carmen Dohmeier, MD  COD LIVER OIL PO Take 1 capsule by mouth every  evening.    Yes Historical Provider, MD  diclofenac (VOLTAREN) 50 MG EC tablet Take 50 mg by mouth 2 (two) times daily as needed for mild pain or moderate pain.  12/20/15  Yes Historical Provider, MD  FREESTYLE LITE test strip USE TO TEST BLOOD SUGAR 2 TIMES DAILY AS INSTRUCTED. DX CODE: E11.29 03/22/15  Yes Philemon Kingdom, MD  gabapentin (NEURONTIN) 300 MG capsule 1 in AM , 1 at lunch, 1 at dinner and 2 at night Patient taking differently: Take 900 mg by mouth 2 (two) times daily.  12/29/15  Yes Carmen Dohmeier, MD  glucose blood (FREESTYLE LITE) test strip Use to test blood sugar 2 times daily as instructed. Dx code: E11.29 06/03/14  Yes Yvonne R Lowne Chase, DO  hydrochlorothiazide (HYDRODIURIL) 12.5 MG tablet Take 12.5-25 mg by mouth daily as needed (fluid).  01/07/16  Yes Historical Provider, MD  Insulin Glargine (LANTUS SOLOSTAR) 100 UNIT/ML Solostar Pen Inject 24 Units into the skin daily at 10 pm. Patient taking differently: Inject 22 Units into the skin daily at 10 pm.  12/19/15  Yes Philemon Kingdom, MD  insulin lispro (HUMALOG KWIKPEN) 100 UNIT/ML KiwkPen Inject 0.14-0.18 mLs (14-18 Units total) into the skin 3 (three) times daily. 12/19/15  Yes Philemon Kingdom, MD  ipratropium (ATROVENT HFA) 17 MCG/ACT inhaler Inhale 2 puffs into the lungs every 6 (six) hours as needed for wheezing. 01/27/15  Yes Edward Saguier, PA-C  lisinopril (PRINIVIL,ZESTRIL) 20 MG tablet Take 20 mg by mouth at bedtime.    Yes Historical Provider, MD  mometasone (ELOCON) 0.1 % cream Apply 1 application topically daily. Patient taking differently: Apply 1 application topically daily as needed (irritation).  06/08/15  Yes Yvonne R Lowne Chase, DO  oxyCODONE-acetaminophen (PERCOCET/ROXICET) 5-325 MG per tablet Take 1 tablet by mouth every 4 (four) hours as needed. Patient taking differently: Take 1 tablet by mouth every 4 (four) hours as needed for moderate pain or severe pain.  03/15/14  Yes Kristen N Ward, DO  promethazine  (PHENERGAN) 25 MG tablet Take 1 tablet (25 mg total) by mouth every 8 (eight) hours as needed for nausea or vomiting. 05/26/15  Yes [name], MD  traMADol (ULTRAM) 50 MG tablet Take 1 tablet in the morning and 1 tablet at night Patient taking differently: Take 50-100 mg by mouth 2 (two) times daily as needed for moderate pain or severe pain. Take 1 tablet in the morning (IF NEEDED)  and 2 tablets at night (SCHEDULED) 12/29/15  Yes [name], MD  ULORIC 40 MG tablet TAKE ONE TABLET BY MOUTH DAILY. Patient taking differently: TAKE 40 MG BY MOUTH EVERY EVENING 12/05/15  Yes Yvonne R Lowne Chase, DO  azelastine (ASTELIN) 0.1 % nasal spray Place 2 sprays into both nostrils 2 (two) times daily. Use in each nostril as directed Patient not taking: Reported on 01/20/2016 01/27/15   Mackie Pai, PA-C  meclizine (ANTIVERT) 25 MG tablet Take 1 tablet (25 mg  total) by mouth 2 (two) times daily. 01/21/16   Roxanna Mew, PA-C  metFORMIN (GLUCOPHAGE-XR) 500 MG 24 hr tablet TAKE 1 TABLET (500 MG TOTAL) BY MOUTH 2 (TWO) TIMES DAILY. Patient not taking: Reported on 01/20/2016 06/24/15   Philemon Kingdom, MD    Family History Family History  Problem Relation Age of Onset  . Cancer Father     PROSTATE  . Healthy Mother   . Cancer Sister     BREAST    Social History Social History  Substance Use Topics  . Smoking status: Former Research scientist (life sciences)  . Smokeless tobacco: Never Used  . Alcohol use No     Allergies   Biaxin [clarithromycin]; Codeine; Erythromycin; Influenza vaccines; Januvia [sitagliptin]; Prednisone; and Augmentin [amoxicillin-pot clavulanate]   Review of Systems Review of Systems  Constitutional: Negative for fever.  HENT: Negative for congestion, ear pain, rhinorrhea, tinnitus and trouble swallowing.   Eyes: Positive for visual disturbance ( diplopia - chronic).  Respiratory: Negative for shortness of breath.   Cardiovascular: Negative for chest pain.  Gastrointestinal:  Positive for nausea. Negative for abdominal pain and vomiting.  Genitourinary: Negative for dysuria and hematuria.  Musculoskeletal: Negative for arthralgias and myalgias.  Skin: Negative for rash.  Neurological: Positive for dizziness and numbness ( chronic). Negative for syncope, facial asymmetry, speech difficulty, light-headedness and headaches.     Physical Exam Updated Vital Signs BP 196/77   Pulse 77   Temp 98.8 F (37.1 C) (Oral)   Resp 15   Ht 5\' 1"  (1.549 m)   Wt 90.7 kg   SpO2 96%   BMI 37.79 kg/m   Physical Exam  Constitutional: She appears well-developed and well-nourished. No distress.  HENT:  Head: Normocephalic and atraumatic.  Mouth/Throat: Oropharynx is clear and moist. No oropharyngeal exudate.  Dizziness provoked with lying back and turning head.  Eyes: Conjunctivae and EOM are normal. Pupils are equal, round, and reactive to light. Right eye exhibits no discharge. Left eye exhibits no discharge. No scleral icterus.  No Nystagmus. Re-assuring HINTS exam.   Neck: Normal range of motion and phonation normal. Neck supple. No neck rigidity. Normal range of motion present.  Cardiovascular: Normal rate, regular rhythm, normal heart sounds and intact distal pulses.   No murmur heard. Pulmonary/Chest: Effort normal and breath sounds normal. No stridor. No respiratory distress. She has no wheezes. She has no rales.  Abdominal: Soft. Bowel sounds are normal. She exhibits no distension. There is no tenderness. There is no rigidity, no rebound, no guarding and no CVA tenderness.  Musculoskeletal: Normal range of motion.  Lymphadenopathy:    She has no cervical adenopathy.  Neurological: She is alert. She has normal strength. She is not disoriented. No cranial nerve deficit or sensory deficit. She exhibits normal muscle tone. Coordination normal. GCS eye subscore is 4. GCS verbal subscore is 5. GCS motor subscore is 6.  Mental Status:  Alert, thought content appropriate,  able to give a coherent history. Speech fluent without evidence of aphasia. Able to follow 2 step commands without difficulty.  Cranial Nerves:  II:  Peripheral visual fields grossly normal, pupils equal, round, reactive to light III,IV, VI: ptosis not present, extra-ocular motions intact bilaterally  V,VII: smile symmetric, facial light touch sensation equal VIII: hearing grossly normal to voice  X: uvula elevates symmetrically  XI: bilateral shoulder shrug symmetric and strong XII: midline tongue extension without fassiculations Motor:  Normal tone. 5/5 in upper and lower extremities bilaterally including strong and equal grip strength  and dorsiflexion/plantar flexion Sensory: light touch normal in all extremities.l Cerebellar: normal finger-to-nose with bilateral upper extremities Gait: patient able ambulate and transition to wheelchair while holding onto bed with steady gait. Per patient this is her baseline. CV: distal pulses palpable throughout    Skin: Skin is warm and dry. She is not diaphoretic.  Psychiatric: She has a normal mood and affect. Her behavior is normal.     ED Treatments / Results  Labs (all labs ordered are listed, but only abnormal results are displayed) Labs Reviewed  BASIC METABOLIC PANEL - Abnormal; Notable for the following:       Result Value   Glucose, Bld 166 (*)    BUN 30 (*)    Creatinine, Ser 1.62 (*)    GFR calc non Af Amer 32 (*)    GFR calc Af Amer 37 (*)    All other components within normal limits  CBC - Abnormal; Notable for the following:    HCT 34.3 (*)    All other components within normal limits  URINALYSIS, ROUTINE W REFLEX MICROSCOPIC - Abnormal; Notable for the following:    APPearance CLOUDY (*)    Glucose, UA >=500 (*)    Protein, ur >=300 (*)    Leukocytes, UA TRACE (*)    Squamous Epithelial / LPF 6-30 (*)    Non Squamous Epithelial 0-5 (*)    All other components within normal limits  CBG MONITORING, ED - Abnormal;  Notable for the following:    Glucose-Capillary 176 (*)    All other components within normal limits  URINE CULTURE    EKG  EKG Interpretation  Date/Time:  Friday January 20 2016 15:55:55 EST Ventricular Rate:  75 PR Interval:    QRS Duration: 95 QT Interval:  379 QTC Calculation: 424 R Axis:   79 Text Interpretation:  Sinus rhythm Abnormal T, consider ischemia, lateral leads Baseline wander in lead(s) V3 No significant change since 2016 Confirmed by GOLDSTON MD, SCOTT [phone]) on 01/20/2016 4:51:19 PM       Radiology Mr Brain Wo Contrast (neuro Protocol)  Result Date: 01/20/2016 CLINICAL DATA:  65 y/o F; 6 days of dizziness. History of Guillain-Barre. EXAM: MRI HEAD WITHOUT CONTRAST TECHNIQUE: Multiplanar, multiecho pulse sequences of the brain and surrounding structures were obtained without intravenous contrast. COMPARISON:  12/18/20171MRI of the head. FINDINGS: Brain: No acute infarction, hemorrhage, hydrocephalus, extra-axial collection or mass lesion. Foci of T2 FLAIR hyperintensity in subcortical and periventricular white matter is consistent with mild chronic microvascular ischemic changes mildly progressed from 2011. Vascular: Normal flow voids. Skull and upper cervical spine: Normal marrow signal. Sinuses/Orbits: No abnormal signal of paranasal sinuses. Underpneumatized frontal sinuses. No significant abnormal signal of mastoid air cells. Orbits are unremarkable. Other: None. IMPRESSION: 1. No acute intracranial abnormality. 2. Mild chronic microvascular ischemic changes progressed from 2011. Electronically Signed   By: Kristine Garbe M.D.   On: 01/20/2016 20:54    Procedures Procedures (including critical care time)  Medications Ordered in ED Medications  ondansetron (ZOFRAN) injection 4 mg (4 mg Intravenous Given 01/20/16 1901)  meclizine (ANTIVERT) tablet 12.5 mg (12.5 mg Oral Given 01/20/16 1843)  sodium chloride 0.9 % bolus 1,000 mL (0 mLs Intravenous Stopped  01/20/16 2317)  sodium chloride 0.9 % bolus 1,000 mL (0 mLs Intravenous Stopped 01/21/16 0115)  LORazepam (ATIVAN) injection 0.5 mg (0.5 mg Intravenous Given 01/21/16 0004)  lisinopril (PRINIVIL,ZESTRIL) tablet 20 mg (20 mg Oral Given 01/21/16 0031)     Initial Impression /  Assessment and Plan / ED Course  I have reviewed the triage vital signs and the nursing notes.  Pertinent labs & imaging results that were available during my care of the patient were reviewed by me and considered in my medical decision making (see chart for details).  Clinical Course as of Jan 21 1251  Fri Jan 20, 2016  2110 Reviewed MR Brain Wo Contrast (neuro protocol) [AM]  Sat Jan 21, 2016  0003 On re-evaluation, pulse Ox adjusted with better plethysmography. O2 sats 98%.   [AM]  0115 On re-assessment patient able to stand and ambulate to wheelchair with steady gait.   [AM]    Clinical Course User Index [AM] Roxanna Mew, PA-C    Patient presents to ED with complaint of dizziness x 6 days. Provoked with changes in head position, lasting a few seconds. Patient is afebrile and non-toxic appearing in NAD. Hypertensive - patient h/o HTN, has not taken her night time mediation, dose given. Heart RRR. Lungs CTABL. Abdomen soft, non-tender, non-distended. No focal neuro deficits on exam. Will check basic labs. Given h/o GBS and complaint of dizziness will MRI brain to r/o intracranial pathology. Mild orthostatic hypotension. IVF, zofran, and meclizine initiated. Discussed pt with Dr. Regenia Skeeter who also evaluated patient, agrees with plan.   EKG shows no significant change from previous. CBC re-assuring. Mild hyperglycemia without AG. Mildly elevated creatinine, proteinuria - ?dehydration. U/A remarkable for trace leukocytes and TNTC WBC, no bacteria, 6-30 squamous epithelial - ?contaminated specimen, pt is asx, will send for culture. Patient given IVF. Encouraged patient to follow up with PCP for retesting. Pt states she  is scheduled to see her kidney specialist in January, encouraged earlier for re-assessment either with PCP or nephrologist.   MRI shows no acute infarction, hemorrhage, mass lesion; normal flows; of incidental finding are mild chronic microvascular ischemic changes. Discussed results with patient. Patient able to transition from bed to wheelchair, ambulating while holding onto object, per patient this is her baseline. She feels comfortable to go home and perform her ADLs. ?secondary to GBS vs. ?peripheral etiology such BPPV given provoked by head changes and lasting only a short duration. Encouraged close follow up with neurology. Rx meclizine. Strict return precautions given. Pt voiced understanding and is agreeable.   Final Clinical Impressions(s) / ED Diagnoses   Final diagnoses:  Dizziness    New Prescriptions Discharge Medication List as of 01/21/2016  1:15 AM    START taking these medications   Details  meclizine (ANTIVERT) 25 MG tablet Take 1 tablet (25 mg total) by mouth 2 (two) times daily., Starting Sat 01/21/2016, Print         [place], [place] 01/21/16 [place], MD 01/21/16 [phone]

## 2016-01-21 ENCOUNTER — Other Ambulatory Visit: Payer: Self-pay | Admitting: Neurology

## 2016-01-21 DIAGNOSIS — G6181 Chronic inflammatory demyelinating polyneuritis: Secondary | ICD-10-CM

## 2016-01-21 MED ORDER — MECLIZINE HCL 25 MG PO TABS: 25.0000 mg | ORAL_TABLET | Freq: Two times a day (BID) | ORAL | 0 refills | Status: DC

## 2016-01-21 MED ORDER — LISINOPRIL 20 MG PO TABS
20.0000 mg | ORAL_TABLET | Freq: Once | ORAL | Status: AC
  Administered 2016-01-21: 20 mg via ORAL
  Filled 2016-01-21: qty 1

## 2016-01-21 NOTE — Discharge Instructions (Signed)
Read the information below.  Your creatinine is slightly elevated, you were given fluids.  Your MRI did not show any acute abnormalities.  You are being prescribed meclizine for dizziness. Please take as directed. This can make you drowsy, please be aware and do not mix with other sedating medications.  Please call and follow up with your primary doctor for re-check of your creatinine.  Please call and follow up with your neurologist regarding your dizziness.  Use the prescribed medication as directed.  Please discuss all new medications with your pharmacist.   You may return to the Emergency Department at any time for worsening condition or any new symptoms that concern you. Return to ED if you develop chest pain, difficulty breathing, worsening dizziness, new neurologic symptoms, or any other new/concerning symptoms.

## 2016-01-22 LAB — URINE CULTURE

## 2016-02-07 ENCOUNTER — Encounter: Payer: Self-pay | Admitting: Family Medicine

## 2016-02-09 NOTE — Telephone Encounter (Signed)
We can refill it but if she is still having trouble with it we probably need to see her to figure out why she is having this problem

## 2016-02-13 ENCOUNTER — Encounter: Payer: Self-pay | Admitting: Family Medicine

## 2016-02-14 MED ORDER — MECLIZINE HCL 25 MG PO TABS: 25.0000 mg | ORAL_TABLET | Freq: Two times a day (BID) | ORAL | 0 refills | Status: DC

## 2016-02-23 ENCOUNTER — Telehealth: Payer: Self-pay | Admitting: Internal Medicine

## 2016-02-23 ENCOUNTER — Telehealth: Payer: Self-pay

## 2016-02-23 MED ORDER — ONETOUCH ULTRASOFT LANCETS MISC: 5 refills | Status: AC

## 2016-02-23 MED ORDER — GLUCOSE BLOOD VI STRP: ORAL_STRIP | 5 refills | Status: AC

## 2016-02-23 MED ORDER — ONETOUCH VERIO IQ SYSTEM W/DEVICE KIT: PACK | 0 refills | Status: AC

## 2016-02-23 NOTE — Telephone Encounter (Signed)
Patient called and advised that Nicole Strong was covered by insurance now. Sent in meter, strips, and lancets.

## 2016-02-23 NOTE — Telephone Encounter (Signed)
° °  Insurance will not cover FREESTYLE LITE test strip  Is there any alternative  [facility] [address] [place] - [place] [phone] (Phone) [phone] (Fax)

## 2016-02-25 ENCOUNTER — Other Ambulatory Visit: Payer: Self-pay | Admitting: Family Medicine

## 2016-03-08 DIAGNOSIS — E113211 Type 2 diabetes mellitus with mild nonproliferative diabetic retinopathy with macular edema, right eye: Secondary | ICD-10-CM | POA: Diagnosis not present

## 2016-03-10 ENCOUNTER — Encounter: Payer: Self-pay | Admitting: Neurology

## 2016-03-10 DIAGNOSIS — H1131 Conjunctival hemorrhage, right eye: Secondary | ICD-10-CM | POA: Diagnosis not present

## 2016-03-10 DIAGNOSIS — H11421 Conjunctival edema, right eye: Secondary | ICD-10-CM | POA: Diagnosis not present

## 2016-03-10 DIAGNOSIS — H05221 Edema of right orbit: Secondary | ICD-10-CM | POA: Diagnosis not present

## 2016-03-10 DIAGNOSIS — L03213 Periorbital cellulitis: Secondary | ICD-10-CM | POA: Diagnosis not present

## 2016-03-12 DIAGNOSIS — E113312 Type 2 diabetes mellitus with moderate nonproliferative diabetic retinopathy with macular edema, left eye: Secondary | ICD-10-CM | POA: Diagnosis not present

## 2016-03-13 ENCOUNTER — Other Ambulatory Visit: Payer: Self-pay

## 2016-03-13 ENCOUNTER — Other Ambulatory Visit: Payer: Self-pay | Admitting: Family Medicine

## 2016-03-20 ENCOUNTER — Encounter: Payer: Self-pay | Admitting: Internal Medicine

## 2016-03-20 ENCOUNTER — Ambulatory Visit (INDEPENDENT_AMBULATORY_CARE_PROVIDER_SITE_OTHER): Payer: Medicare Other | Admitting: Internal Medicine

## 2016-03-20 VITALS — BP 132/78 | HR 67

## 2016-03-20 DIAGNOSIS — E1165 Type 2 diabetes mellitus with hyperglycemia: Secondary | ICD-10-CM | POA: Diagnosis not present

## 2016-03-20 DIAGNOSIS — E785 Hyperlipidemia, unspecified: Secondary | ICD-10-CM

## 2016-03-20 DIAGNOSIS — Z794 Long term (current) use of insulin: Secondary | ICD-10-CM

## 2016-03-20 DIAGNOSIS — E1122 Type 2 diabetes mellitus with diabetic chronic kidney disease: Secondary | ICD-10-CM | POA: Diagnosis not present

## 2016-03-20 DIAGNOSIS — IMO0002 Reserved for concepts with insufficient information to code with codable children: Secondary | ICD-10-CM

## 2016-03-20 DIAGNOSIS — N182 Chronic kidney disease, stage 2 (mild): Secondary | ICD-10-CM

## 2016-03-20 LAB — LIPID PANEL
CHOLESTEROL: 192 mg/dL (ref 0–200)
HDL: 59.8 mg/dL (ref >39.00)
LDL CALC: 106 mg/dL — AB (ref 0–99)
NonHDL: 132.43
TRIGLYCERIDES: 134 mg/dL (ref 0.0–149.0)
Total CHOL/HDL Ratio: 3
VLDL: 26.8 mg/dL (ref 0.0–40.0)

## 2016-03-20 LAB — POCT GLYCOSYLATED HEMOGLOBIN (HGB A1C): HEMOGLOBIN A1C: 6.7

## 2016-03-20 NOTE — Progress Notes (Signed)
Patient ID: Nicole Strong, female   DOB: [birth year], 66 y.o.   MRN: [id number]  HPI: Nicole Strong is a 66 y.o.-year-old female, returning for f/u for DM2, dx in ~2009, insulin-dependent, uncontrolled, with complications (CKD). Last visit 4.5 mo ago.  She had a lot of stress with mother being sick (dementia) and on hospice >> since last visit, her mother died. Since then, she ate much less than before >> sugars much better >> she did not have to take insulin consistently.  Last hemoglobin A1c was: Lab Results  Component Value Date   HGBA1C 8.1 12/19/2015   HGBA1C 7.4 08/02/2015   HGBA1C 9.0 03/07/2015   Pt is on a regimen of: - Lantus 18 >> 22 units at bedtime  - Humalog:  10 units with a small meal 15 units with a larger meal - Metformin ER 500 mg 2x a day  She also started [employer]. She tried regular Metformin >> diarrhea >> had to stop it She tried Januvia >> had diarrhea from it. She tried Tradjenta >> could not afford it, also blocked the abs of her other meds. She tried Wechol 1000 mg po bid >> could not afford it She tried Actos >> sugars better, but had bladder cancer We stopped Starlix 120 mg 3x a day with meals.  Pt checks her sugars 1x a day and they are - forgot log: - am: <200 >> 145-171, 185 >> 120-140 >> 117-172, 181 >> 80-141, 157 - 2h after b'fast:200-260 >> 188, 199, 215 >> n/c >> 210-218 >> 155, 275 - before lunch: 200s >> n/c >> 180 >> n/c >> 153, 168 >> 141-161, 240 - 2h after lunch: n/c >> n/c >> 151-199 >> n/c >> 196 >> 122 - before dinner: 200-230 >> 148-185 >> 130-140, 150  >> 143-158 >> 107, 117-165, 203 - 2h after dinner: n/c >> 165-242 >> 140-160 >> 192-200, 212 >> 141, 234 - bedtime: n/c >> 180-198 >> 200-300 >> 195-218, 299 >> 140-160 >> 160 >> 173-212 - nighttime: n/c >> 121-208 >> 149-186 >> n/c  No lows. Lowest sugar was 107 >> 117 >> 80; she has hypoglycemia awareness at 200.  Highest sugar was 300s >> 200s >> 240  Glucometer:  Freestyle Lite  Pt's meals are: - Breakfast: eggs + ham + toast b/c gastroparesis - Lunch >> mostly brunch: 1/2 sandwich - Dinner: salad or TV dinner or egg + English muffin - Snacks: cheese, PB crackers No sodas  - + CKD, last BUN/creatinine:  Lab Results  Component Value Date   BUN 30 (H) 01/20/2016   CREATININE 1.62 (H) 01/20/2016  She had L nephrectomy 2011. - last set of lipids: Lab Results  Component Value Date   CHOL 127 04/29/2014   HDL 53.10 04/29/2014   LDLCALC 46 04/29/2014   LDLDIRECT 210.4 12/31/2013   TRIG 141.0 04/29/2014   CHOLHDL 2 04/29/2014  On Lipitor 10 mg 1x a week. - last eye exam was in 06/2015 >> sees retina specialist. Has double vision from GBS. - + numbness and tingling in her feet.  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 In 2011.  She had pancreatitis >> ED 03/15/2014. On 04/06/2014 >> went to ED with Hgly , CBG >600.  She had dizziness in 01/2016 >> went to the ED >> started Antivert. Will see neurology.   ROS: Constitutional: no weight gain, no fatigue, no subjective hypothermia Eyes: + blurry vision, no xerophthalmia ENT:  no sore throat, no nodules palpated in throat, no dysphagia/odynophagia, no hoarseness Cardiovascular: no CP/SOB/palpitations/leg swelling Respiratory: no cough/SOB/+ wheezing Gastrointestinal: no N/V/D/C/acid reflux Musculoskeletal: no muscle/no joint aches Skin: no rashes Neurological: no tremors/numbness/tingling/+ dizziness, no HA  I reviewed pt's medications, allergies, PMH, social hx, family hx, and changes were documented in the history of present illness. Otherwise, unchanged from my initial visit note:  Past Medical History:  Diagnosis Date  . Cancer ([place])   . 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 Diabetes mellitus    2  . 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 Hyperlipidemia   . Hypertension   . Renal disorder    L kidney removed  . Renal mass    BEING  WORKED UP BY UROLOGY  . 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 CESAREAN SECTION    . CHOLECYSTECTOMY    . NEPHRECTOMY     History   Social History  . Marital Status: Divorced    Spouse Name: N/A    Number of Children: 1  . Years of Education: 14   Occupational History  .      disabled   Social History Main Topics  . Smoking status: Former Research scientist (life sciences)  . Smokeless tobacco: Never Used  . Alcohol Use: No  . Drug Use: No   Social History Narrative   Patient lives alone.    Disabled.   Education college education.   Right handed.   Caffeine. Rare one cup.   Current Outpatient Prescriptions on File Prior to Visit  Medication Sig Dispense Refill  . amitriptyline (ELAVIL) 10 MG tablet TAKE 4 TABS AT BEDTIME  FOR 1 WEEK THEN INCREASE TO 5 AT BEDTIME IF NECESSARY 150 tablet 3  . aspirin EC 81 MG tablet Take 81 mg by mouth daily.    . calcium citrate (CALCITRATE - DOSED IN MG ELEMENTAL CALCIUM) 950 MG tablet Take 200 mg of elemental calcium by mouth daily.    . cholecalciferol (VITAMIN D) 1000 UNITS tablet Take 5,000 Units by mouth daily.    . COD LIVER OIL PO Take 1 capsule by mouth daily.    Marland Kitchen gabapentin (NEURONTIN) 300 MG capsule TAKE 1 CAPSULE (300 MG TOTAL) BY MOUTH 4 (FOUR) TIMES DAILY. 120 capsule 3  . linagliptin (TRADJENTA) 5 MG TABS tablet Take 1 tablet (5 mg total) by mouth daily. 30 tablet 2  . lisinopril (PRINIVIL,ZESTRIL) 20 MG tablet Take 20 mg by mouth daily.    . metFORMIN (GLUCOPHAGE) 1000 MG tablet TAKE 1 TABLET (1,000 MG TOTAL) BY MOUTH 2 (TWO) TIMES DAILY WITH A MEAL. OFFICE VISIT DUE FOR MORE REFILLS. 60 tablet 0  . metoCLOPramide (REGLAN) 10 MG tablet Take 1 tablet (10 mg total) by mouth 4 (four) times daily -  before meals and at bedtime. 120 tablet 2  . nateglinide (STARLIX) 60 MG tablet Take 1 tablet (60 mg total) by mouth 3 (three) times daily with meals. 90 tablet 2  . promethazine (PHENERGAN) 25  MG tablet TAKE 1 TABLET (25 MG TOTAL) BY MOUTH AS NEEDED as directed by dr dohmeier  FOR NAUSEA. 90 tablet 1  . simvastatin (ZOCOR) 40 MG tablet Take 1 tablet (40 mg total) by mouth at bedtime. 30 tablet 2  . traMADol (ULTRAM) 50 MG tablet Take 50-100 mg by mouth every 6 (six) hours as needed for moderate pain. Take 1 tablet in the morning and 2  tablets at night     No current facility-administered medications on file prior to visit.   Allergies  Allergen Reactions  . Biaxin [Clarithromycin] Other (See Comments)    "face turns red"  . Codeine Nausea And Vomiting and Other (See Comments)    Hallucinations  . Erythromycin Nausea And Vomiting  . Influenza Vaccines     Had Guillene-Barre sd.  Celesta Gentile [Sitagliptin]   . Prednisone     Mania.  . Augmentin [Amoxicillin-Pot Clavulanate] Diarrhea   Family History  Problem Relation Age of Onset  . Cancer Father     PROSTATE  . Healthy Mother   . Cancer Sister     BREAST   PE: BP 132/78 (BP Location: Left Arm, Patient Position: Sitting)   Pulse 67   SpO2 93%  There is no height or weight on file to calculate BMI. - refused to be weighed - in wheelchair  Wt Readings from Last 3 Encounters:  01/20/16 200 lb (90.7 kg)  06/07/15 204 lb (92.5 kg)  04/21/15 215 lb (97.5 kg)   Constitutional: overweight, in NAD, in wheelchair Eyes: PERRLA, EOMI, no exophthalmos ENT: moist mucous membranes, no thyromegaly, no cervical lymphadenopathy Cardiovascular: RRR, No MRG Respiratory: CTA B Gastrointestinal: abdomen soft, NT, ND, BS+ Musculoskeletal: no deformities, strength intact in all 4 Skin: dry, warm, no rashes Neurological: no tremor with outstretched hands, DTR not elicited  ASSESSMENT: 1. DM2, insulin-dependent, uncontrolled, with complications - CKD  She has Gastroparesis (self-dx) 2/2 GB.  2. HL  PLAN:  1. Patient with long-standing, uncontrolled diabetes, on oral antidiabetic regimen + basal-bolus regimen and Metformin. Her  sugars are much better now, with occasional hyperglycemic spikes (dietary indiscretions) >> no need to chang her regimen. - I suggested to:  Patient Instructions  Please continue:  - Lantus 22 units at bedtime - Humalog:  10 units with a small meal 15 units with a larger meal - Metformin ER 500 mg 2x a day.  Please return in 3 months with your sugar log.   - continue checking sugars at different times of the day - check 2-3 times a day, rotating checks - advised for yearly eye exams >> she is UTD  - new HbA1c >> 6.7% (much better!) - Return to clinic in 3 mo with sugar log   2. HL - she is taking Lipitor 10 mg only 1x a week reather than the recommended 3x a week - check Lipids today as she is fasting - reviewed levels from 2015 and 2016 with her  Office Visit on 03/20/2016  Component Date Value Ref Range Status  . Cholesterol 03/20/2016 192  0 - 200 mg/dL Final  . Triglycerides 03/20/2016 134.0  0.0 - 149.0 mg/dL Final  . HDL 03/20/2016 59.80  >39.00 mg/dL Final  . VLDL 03/20/2016 26.8  0.0 - 40.0 mg/dL Final  . LDL Cholesterol 03/20/2016 106* 0 - 99 mg/dL Final  . Total CHOL/HDL Ratio 03/20/2016 3   Final  . NonHDL 03/20/2016 132.43   Final  . Hemoglobin A1C 03/20/2016 6.7   Final   LDL has increased from before. I would suggest to increase the Lipitor to 3 times a week, and she was taking it before.  Philemon Kingdom, MD PhD [facility] Endocrin[facility]

## 2016-03-20 NOTE — Patient Instructions (Signed)
Please continue:  - Lantus 22 units at bedtime - Humalog:  10 units with a small meal 15 units with a larger meal - Metformin ER 500 mg 2x a day.  Please return in 3 months with your sugar log.

## 2016-03-21 ENCOUNTER — Other Ambulatory Visit: Payer: Self-pay | Admitting: Neurology

## 2016-03-21 DIAGNOSIS — G6189 Other inflammatory polyneuropathies: Secondary | ICD-10-CM

## 2016-03-21 DIAGNOSIS — E1340 Other specified diabetes mellitus with diabetic neuropathy, unspecified: Secondary | ICD-10-CM

## 2016-03-21 DIAGNOSIS — G6181 Chronic inflammatory demyelinating polyneuritis: Secondary | ICD-10-CM

## 2016-04-01 ENCOUNTER — Other Ambulatory Visit: Payer: Self-pay | Admitting: Family Medicine

## 2016-04-02 NOTE — Telephone Encounter (Signed)
Patient needs follow up appt.  thx PC

## 2016-04-04 ENCOUNTER — Ambulatory Visit (INDEPENDENT_AMBULATORY_CARE_PROVIDER_SITE_OTHER): Payer: Medicare Other | Admitting: Neurology

## 2016-04-04 ENCOUNTER — Other Ambulatory Visit: Payer: Self-pay

## 2016-04-04 ENCOUNTER — Encounter: Payer: Self-pay | Admitting: Neurology

## 2016-04-04 VITALS — BP 152/68 | HR 76 | Resp 18

## 2016-04-04 DIAGNOSIS — H8101 Meniere's disease, right ear: Secondary | ICD-10-CM | POA: Diagnosis not present

## 2016-04-04 DIAGNOSIS — G6289 Other specified polyneuropathies: Secondary | ICD-10-CM | POA: Diagnosis not present

## 2016-04-04 MED ORDER — PROMETHAZINE HCL 25 MG PO TABS: 25.0000 mg | ORAL_TABLET | Freq: Three times a day (TID) | ORAL | 3 refills | Status: AC | PRN

## 2016-04-04 MED ORDER — INSULIN LISPRO 100 UNIT/ML (KWIKPEN): PEN_INJECTOR | SUBCUTANEOUS | 5 refills | Status: AC

## 2016-04-04 NOTE — Addendum Note (Signed)
Addended by: [name] on: 04/04/2016 12:26 PM   Modules accepted: Orders, Level of Service

## 2016-04-04 NOTE — Progress Notes (Addendum)
Provider:  [name], M D  Referring Provider: Carollee Herter, Alferd Apa, * Primary Care Physician:  Ann Held, DO  Chief Complaint  Patient presents with  . Follow-up    Rm 10. Patient would like to discuss her dizziness today. States that Antivert does not help her.     HPI:  Nicole Strong is a 66 y.o. female , seen here as a  revisit from Dr. 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  In 2015 [name] underwent an EMG with nerve conduction study which I basically order to qualify her for IV Ig therapy. There were other obstacles to IVIG as she has only one kidney. However the nerve conduction study and EMG performed by Dr. Jannifer Franklin documented axonal peripheral nerve injury and not demyelinating. 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 She seems to have had the axonal form of Guillain-Barr which has a very slow recovery rate at often incomplete recovery. It is also more prone to have pain involvement. IVIG was for this reason not longer considered a feasible treatment option. At this time there is no specific treatment for axonal neuropathies except moderate diet moderate exercise lifestyle changes that will promote healthy nerves. She developed diabetes mellitus,  is on insulin. Follow for diabetes hopefully also will improve her overall neurologic health. She continues to gain weight on insulin which is common. She has struggled to visit the Guillain-Barr syndrome and aftermath now for over 4 years and had excessive weight gain due to inactivity.  Interval history 12-29-14, The patient reports stabilized but not improving ambulation. She is able to ambulate through her small apartment , but sits in her wheelchair for some manual activities. She lives alone. The patient reminded me that she suffered  a head injury in 2004 she ended up being disabled for on short-term disability and was finally laid off her job. She worked at [employer]. The job was emotionally and physically stressful and depended on her productivity is beating fast and quick responding. 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  Interval history from 04/04/2016, I have the pleasure of seeing Nicole Strong today we'll has been followed in this office for over 3 years following a hospitalization for what turned out to be an axonal form of Guillain-Barr syndrome. In 2015 she underwent EMG and nerve conduction studies to qualify for IVIG therapy under the assumption that she does have CIDP, but the results of the studies confirmed that axonal damage was present in that IVIG would not benefit her. She developed diabetes mellitus now is on insulin, hemoglobin A1c is at 6.7. She struggles to lose weight or at least to stop gaining weight. Of course, her exercise capacity has something to do with not being able to lose as much weight. Because of gastroparesis she feels that she does not eat much and she avoids high sugar and high starch food.  Stressors : Her mother died in 01-20-2023 and her cat on January 8th. Her sister is disputing the inheritance . Prior to this  time , she developed dizziness, orthostatic, with movement - the room was spinning around her. VERTIGO   .    Review of Systems: Out of a complete 14 system review, the patient complains of only the following symptoms, and all other reviewed systems are negative. Pain,  weight gain, diabetes, wheelchair bound. VERTIGO  Social History   Social History  . Marital status: Divorced    Spouse name: N/A  . Number of children: 1  . Years of education: 52   Occupational History  .      disabled   Social History Main Topics  . Smoking status: Former Research scientist (life sciences)  . Smokeless tobacco: Never Used  . Alcohol use No  . Drug use: No  . Sexual activity: Not on  file   Other Topics Concern  . Not on file   Social History Narrative   Patient lives at home alone.    Disabled.   Education college education.   Right handed.   Caffeine. Rare one cup.    Family History  Problem Relation Age of Onset  . Cancer Father     PROSTATE  . Healthy Mother   . Cancer Sister     BREAST    Past Medical History:  Diagnosis Date  . Cancer ([place])   . 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 Diabetes mellitus    2  . 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 Hyperlipidemia   . Hypertension   . Renal disorder    L kidney removed  . Renal mass    BEING WORKED UP BY UROLOGY  . 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 CESAREAN SECTION    . CHOLECYSTECTOMY    . NEPHRECTOMY      Current Outpatient Prescriptions  Medication Sig Dispense Refill  . albuterol (VENTOLIN HFA) 108 (90 BASE) MCG/ACT inhaler Inhale 2 puffs into the lungs every 6 (six) hours as needed for wheezing or shortness of breath. 1 Inhaler 1  . amitriptyline (ELAVIL) 10 MG tablet TAKE 4 TABLETS (40 MG TOTAL) BY MOUTH AT BEDTIME. 120 tablet 0  . atorvastatin (LIPITOR) 10 MG tablet 1 TABLET BY MOUTH 3 TIMES A WEEK 12 tablet 0  . azelastine (ASTELIN) 0.1 % nasal spray Place 2 sprays into both nostrils 2 (two) times daily. Use in each nostril as directed 30 mL 12  . BD PEN NEEDLE NANO U/F 32G X 4 MM MISC USE 1X A DAY 100 each 1  . Blood Glucose Monitoring Suppl (ONETOUCH VERIO IQ SYSTEM) w/Device KIT Used to check sugar 2 times daily. 1 kit 0  . cephALEXin (KEFLEX) 500 MG capsule     . Cholecalciferol (VITAMIN D3) 50000 units CAPS Take 50,000 Units by mouth once a week. 12 capsule 3  . COD LIVER OIL PO Take 1 capsule by mouth every evening.     . diclofenac (VOLTAREN) 50 MG EC tablet Take 50 mg by mouth 2 (two) times daily as needed for mild pain or moderate pain.     Marland Kitchen gabapentin (NEURONTIN) 300 MG capsule 1 in AM  , 1 at lunch, 1 at dinner and 2 at night (Patient taking differently: Take 900 mg by mouth 2 (two) times daily. ) 180 capsule 3  . glucose blood (ONETOUCH VERIO) test strip Use as instructed to check sugar 2 times daily 200 each 5  . hydrochlorothiazide (HYDRODIURIL) 12.5 MG tablet Take 12.5-25 mg by mouth daily as needed (fluid).     . Insulin Glargine (LANTUS SOLOSTAR) 100 UNIT/ML Solostar Pen Inject 24 Units into the skin daily at 10 pm. (Patient taking differently: Inject 22 Units into the skin daily at 10 pm. ) 5 pen 5  .  insulin lispro (HUMALOG KWIKPEN) 100 UNIT/ML KiwkPen Inject 0.14-0.18 mLs (14-18 Units total) into the skin 3 (three) times daily. 15 pen 5  . ipratropium (ATROVENT HFA) 17 MCG/ACT inhaler Inhale 2 puffs into the lungs every 6 (six) hours as needed for wheezing. 1 Inhaler 2  . Lancets (ONETOUCH ULTRASOFT) lancets Use as instructed to check sugar 2 times daily. 200 each 5  . lisinopril (PRINIVIL,ZESTRIL) 20 MG tablet Take 20 mg by mouth at bedtime.     . meclizine (ANTIVERT) 25 MG tablet TAKE ONE TABLET BY MOUTH TWICE A DAY 20 tablet 0  . metFORMIN (GLUCOPHAGE-XR) 500 MG 24 hr tablet TAKE 1 TABLET (500 MG TOTAL) BY MOUTH 2 (TWO) TIMES DAILY. 60 tablet 2  . mometasone (ELOCON) 0.1 % cream Apply 1 application topically daily. (Patient taking differently: Apply 1 application topically daily as needed (irritation). ) 45 g 1  . oxyCODONE-acetaminophen (PERCOCET/ROXICET) 5-325 MG per tablet Take 1 tablet by mouth every 4 (four) hours as needed. (Patient taking differently: Take 1 tablet by mouth every 4 (four) hours as needed for moderate pain or severe pain. ) 15 tablet 0  . promethazine (PHENERGAN) 25 MG tablet Take 1 tablet (25 mg total) by mouth every 8 (eight) hours as needed for nausea or vomiting. 90 tablet 3  . traMADol (ULTRAM) 50 MG tablet Take 1 tablet in the morning and 1 tablet at night (Patient taking differently: Take 50-100 mg by mouth 2 (two) times daily as needed for  moderate pain or severe pain. Take 1 tablet in the morning (IF NEEDED)  and 2 tablets at night (SCHEDULED)) 60 tablet 3  . ULORIC 40 MG tablet TAKE ONE TABLET BY MOUTH DAILY. (Patient taking differently: TAKE 40 MG BY MOUTH EVERY EVENING) 30 tablet 5   No current facility-administered medications for this visit.     Allergies as of 04/04/2016 - Review Complete 04/04/2016  Allergen Reaction Noted  . Biaxin [clarithromycin] Other (See Comments) 05/31/2012  . Codeine Nausea And Vomiting and Other (See Comments) 10/19/2009  . Erythromycin Nausea And Vomiting 06/17/2012  . Influenza vaccines  12/19/2015  . Januvia [sitagliptin]  06/22/2014  . Prednisone  04/06/2014  . Augmentin [amoxicillin-pot clavulanate] Diarrhea 08/02/2015    Vitals: BP (!) 152/68   Pulse 76   Resp 18  Last Weight:  Wt Readings from Last 1 Encounters:  01/20/16 200 lb (90.7 kg)   Last Height:   Ht Readings from Last 1 Encounters:  01/20/16 _0  (1.549 m)    Physical exam:  General: The patient is awake, alert and appears not in acute distress. The patient is well groomed. Head: Normocephalic, atraumatic. Neck is short but supple. Mallampati 3 , neck circumference: 15.5 " Cardiovascular:  Regular rate and rhythm, without  murmurs or carotid bruit, and without distended neck veins. Respiratory: Lungs are clear to auscultation.Skin:  Without evidence of edema, , she has rosacea. Skin changes visible over the lower arms and blotches.  Low skin turgor. Trunk: BMI is severly elevated.  Neurologic exam : The patient is awake and alert, oriented to place and time. Memory subjective described as intact.  There is a normal attention span & concentration ability.Speech is fluent without dysarthria, dysphonia or aphasia. Mood and affect are appropriate.   Extraocular movements  in vertical and horizontal planes intact and without nystagmus. After rapid head movement she has corrective nystagmus to the right.   Visual  fields by finger perimetry are intact.Hearing to finger rub intact.  Facial sensation intact to fine touch.  Facial motor strength is symmetric and tongue and uvula move midline.Tongue protrusion into either cheek is normal. Shoulder shrug is weak-  Hip flexion is very weak.    Decreased muscle tone in all extremities, loss of mass. Her grip strength is weak ,  she cannot maintain a strong grip. She continues to have spinal and lower back pain and most of her strength loss is evident from the navel down involving both lower extremities.  This is commonly a pattern of Guillain-Barr. I was able to elicit deep tendon reflexes in her upper extremities. These are not preserved in her lower extremities. Gait and station: Patient in wheelchair, able to stand only with assistance.   She can walk with a walker on an even surface. She cannot manage an incline.  Strength abnormal,  limits. Stance is instable  Tandem gait deferred.   Deep tendon reflexes: in the upper and lower extremities are symmetric and intact. Babinski maneuver response is downgoing. Assessment:  After physical and neurologic examination, review of laboratory studies, imaging, neurophysiology testing and pre-existing records, assessment is that of :   RIGHT side vestibular Vertigo. Referral to vestibular rehab.    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 Dr. Doy Mince followed initially.   The patient never recovered fully, but has not longer progressed.   Spinal tap had been performed in the hospital but EMG and nerve conduction studies were not performed until earlier in 2016 .   Spells :  Loss of awareness was not part of it. She described "a wave coming over" her. She describes this as if being electrified from the feet up. She felt like being electrocuted. Her hands shook . His occurred while in bed and reclined. The patient describes  electric shock sensations in both upper extremities,  and sometimes in her feet- ' a lightning bolt "  . Osteopenia: continue on Vit D 3 per orthopedist  .  Plan:  Treatment plan and additional workup :  Vestibular rehab ordered.  Axonal polyneuropathy with ascending pattern, subtype of Guillain-Barr, not qualifying as CIDP and therefore not treatable with IVIG. IVIG was discontinued after the axonal nature of her disease was confirmed.Main management is physical therapy diet adjustment, and pain therapy as needed. Tramadol. She has only one kidney, we have to keep that in mind. She will inform herself about low carb diet.   Seizure medications are already in place , mainly to treat the neuropathic pain and back pain.  Neurontin will be continued- Creatinine needs to be checked q 6 month.   Asencion Partridge Braxon Suder MD 04/04/2016

## 2016-04-06 DIAGNOSIS — Z8551 Personal history of malignant neoplasm of bladder: Secondary | ICD-10-CM | POA: Diagnosis not present

## 2016-04-07 ENCOUNTER — Other Ambulatory Visit: Payer: Self-pay | Admitting: Internal Medicine

## 2016-04-10 ENCOUNTER — Telehealth: Payer: Self-pay | Admitting: *Deleted

## 2016-04-10 NOTE — Telephone Encounter (Signed)
See my chart message

## 2016-04-10 NOTE — Telephone Encounter (Signed)
Left message on Voicemail for patient to call and schedule

## 2016-04-11 DIAGNOSIS — E113211 Type 2 diabetes mellitus with mild nonproliferative diabetic retinopathy with macular edema, right eye: Secondary | ICD-10-CM | POA: Diagnosis not present

## 2016-04-12 ENCOUNTER — Ambulatory Visit: Payer: Medicare Other | Attending: Neurology | Admitting: Physical Therapy

## 2016-04-12 DIAGNOSIS — R300 Dysuria: Secondary | ICD-10-CM | POA: Diagnosis not present

## 2016-04-12 DIAGNOSIS — R42 Dizziness and giddiness: Secondary | ICD-10-CM | POA: Insufficient documentation

## 2016-04-12 DIAGNOSIS — H8113 Benign paroxysmal vertigo, bilateral: Secondary | ICD-10-CM | POA: Insufficient documentation

## 2016-04-12 DIAGNOSIS — Z8551 Personal history of malignant neoplasm of bladder: Secondary | ICD-10-CM | POA: Diagnosis not present

## 2016-04-12 DIAGNOSIS — R310 Gross hematuria: Secondary | ICD-10-CM | POA: Diagnosis not present

## 2016-04-12 NOTE — Therapy (Addendum)
[facility] [address] [place], [place], [address] Phone: [phone]   Fax:  [phone]  Physical Therapy Evaluation  Patient Details  Name: Nicole Strong MRN: [id number] Date of Birth: Feb 23, 1950 Referring Provider: Dr. Asencion Partridge Dohmeier  Encounter Date: 04/12/2016      PT End of Session - 04/12/16 1021    Visit Number 1   Number of Visits 4   Date for PT Re-Evaluation 05/10/16   Authorization Type [employer] Medicare   PT Start Time 0920   PT Stop Time 1000   PT Time Calculation (min) 40 min   Activity Tolerance Patient tolerated treatment well   Behavior During Therapy [facility] for tasks assessed/performed      Past Medical History:  Diagnosis Date  . Cancer ([place])   . 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 Diabetes mellitus    2  . 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 Hyperlipidemia   . Hypertension   . Renal disorder    L kidney removed  . Renal mass    BEING WORKED UP BY UROLOGY  . 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 CESAREAN SECTION    . CHOLECYSTECTOMY    . NEPHRECTOMY      There were no vitals filed for this visit.       Subjective Assessment - 04/12/16 0925    Subjective Pt is a 66 y/o female who presents to OPPT for dizziness x 4-6 months ago.  Pt reports symptoms with lying down in bed at night described as spinning.  Pt reports symptoms got progressively worse and happened every time she would lie down and with rolling over.   Pertinent History GBS (axonal), DM, HLD, HTN, single kidney   Patient Stated Goals get rid of dizziness   Currently in Pain? No/denies            [facility] PT Assessment - 04/12/16 3606      Assessment   Medical Diagnosis vertigo   Referring Provider Dr. Asencion Partridge Dohmeier   Onset Date/Surgical Date --  4-5 months ago   Next MD Visit 06/28/16   Prior Therapy at this clinic for GBS      Precautions   Precautions Fall     Restrictions   Weight Bearing Restrictions No     Balance Screen   Has the patient fallen in the past 6 months No   Has the patient had a decrease in activity level because of a fear of falling?  No   Is the patient reluctant to leave their home because of a fear of falling?  No     Home Environment   Living Environment Private residence   Living Arrangements Alone   Type of [place];Wheelchair - [employer] - 2 wheels;Tub bench;Hand held shower head;Transport chair     Prior Function   Level of Independence Independent with homemaking with wheelchair   Vocation On disability   Vocation Requirements walks short distances with RW   Leisure nothing     Cognition   Overall Cognitive Status Within Functional Limits for tasks assessed     Observation/Other Assessments   Focus on Therapeutic Outcomes (FOTO)  45 (55% limited; predicted 37% limited)   Dizziness Handicap Inventory (DHI)  60 (severe)  Vestibular Assessment - 04/12/16 0934      Vestibular Assessment   General Observation no dizziness sitting     Symptom Behavior   Type of Dizziness Spinning   Frequency of Dizziness lying down and rolling, getting up out of bed   Duration of Dizziness < 1 min   Aggravating Factors Supine to sit;Lying supine   Relieving Factors Head stationary;Lying supine;Closing eyes     Occulomotor Exam   Occulomotor Alignment Normal   Spontaneous Absent   Gaze-induced Absent   Smooth Pursuits Intact   Saccades Intact   Comment head thrust negative     Vestibulo-Occular Reflex   VOR 1 Head Only (x 1 viewing) WNL     Positional Testing   Dix-Hallpike Dix-Hallpike Right;Dix-Hallpike Left   Sidelying Test Sidelying Right;Sidelying Left   Horizontal Canal Testing Horizontal Canal Right;Horizontal Canal Left     Dix-Hallpike Right    Dix-Hallpike Right Duration none   Dix-Hallpike Right Symptoms No nystagmus     Dix-Hallpike Left   Dix-Hallpike Left Duration none   Dix-Hallpike Left Symptoms No nystagmus     Sidelying Right   Sidelying Right Duration none   Sidelying Right Symptoms No nystagmus     Sidelying Left   Sidelying Left Duration none   Sidelying Left Symptoms No nystagmus     Horizontal Canal Right   Horizontal Canal Right Duration none   Horizontal Canal Right Symptoms Normal     Horizontal Canal Left   Horizontal Canal Left Duration none   Horizontal Canal Left Symptoms Normal                Vestibular Treatment/Exercise - 04/12/16 1020      Vestibular Treatment/Exercise   Vestibular Treatment Provided Canalith Repositioning   Canalith Repositioning Epley Manuever Right;Epley Manuever Left      EPLEY MANUEVER RIGHT   Number of Reps  1   Overall Response No change      EPLEY MANUEVER LEFT   Number of Reps  1   Overall Response  No change               PT Education - 04/12/16 1021    Education provided Yes   Education Details BPPV   Person(s) Educated Patient   Methods Explanation   Comprehension Verbalized understanding             PT Long Term Goals - 04/12/16 1028      PT LONG TERM GOAL #1   Title report resolve of symptoms with bed mobility (05/10/16)   Time 4   Period Weeks   Status New     PT LONG TERM GOAL #2   Title improve DHI to < 35% for improved function (05/10/16)   Time 4   Period Weeks   Status New               Plan - 04/12/16 1026    Clinical Impression Statement Pt is a 66 y/o female who presents to OPPT for 4-5 month hx of vertigo and dizziness.  Unable to provoke symptoms in clinic today but subjective highly suggestive of BPPV, specifically L as symptoms are consistent when lying onto L side of bed and rolling to L.  Performed bil epley's x 1 rep at pt's request to tx as she wants to minimize visits due to copay.  Will plan  to see PRN to treat dizziness.  Pt at baseline for all other mobility due to hx of GBS.  Rehab Potential Good   PT Frequency 1x / week   PT Duration 4 weeks   PT Treatment/Interventions ADLs/Self Care Home Management;Canalith Repostioning;Neuromuscular re-education;Therapeutic exercise;Therapeutic activities;Patient/family education;Vestibular   PT Next Visit Plan reassess PRN, treat dizziness as indicated   Consulted and Agree with Plan of Care Patient      Patient will benefit from skilled therapeutic intervention in order to improve the following deficits and impairments:  Dizziness  Visit Diagnosis: Dizziness and giddiness - Plan: PT plan of care cert/re-cert  BPPV (benign paroxysmal positional vertigo), bilateral - Plan: PT plan of care cert/re-cert      G-Codes - 70/14/10 1030    Functional Assessment Tool Used (Outpatient Only) clinical judgement, DHI, negative testing today   Functional Limitation Changing and maintaining body position   Changing and Maintaining Body Position Current Status (V0131) At least 40 percent but less than 60 percent impaired, limited or restricted   Changing and Maintaining Body Position Goal Status (Y3888) At least 20 percent but less than 40 percent impaired, limited or restricted       Problem List Patient Active Problem List   Diagnosis Date Noted  . Axonal GBS (Guillain-Barre syndrome) ([place]) 12/29/2015  . Vitamin D deficiency 12/29/2015  . Pain in toe of left foot 06/12/2015  . Uncontrolled type 2 diabetes mellitus with stage 2 chronic kidney disease, with long-term current use of insulin ([place]) 04/21/2015  . Neuropathy due to secondary diabetes ([place]) 12/29/2014  . CIDP (chronic inflammatory demyelinating polyneuropathy) ([place]) 12/29/2014  . 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 Enteritis due to Clostridium difficile 08/15/2013  . ARF (acute renal failure) ([place]) 08/14/2013  . Acute renal failure ([place]) 08/14/2013   . Obesity (BMI 30-39.9) 11/19/2012  . Single kidney   . Diarrhea 05/31/2012  . Dehydration 05/31/2012  . Weakness generalized 05/31/2012  . Hyponatremia 05/31/2012  . Gait disorder 05/19/2012  . 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 NEUROPATHY 01/26/2010  . BACK PAIN, LUMBAR, WITH RADICULOPATHY 01/26/2010  . NAUSEA WITH VOMITING 01/26/2010  . Hyperlipidemia 10/19/2009  . HYPERTENSION 10/19/2009  . CARDIAC MURMUR 10/19/2009  . POSTMENOPAUSAL STATUS 10/19/2009      Laureen Abrahams, PT, DPT 04/12/16 10:32 AM    [facility] [address] [place], [place], [address] Phone: [phone]   Fax:  [phone]  Name: ALOISE COPUS MRN: [id number] Date of Birth: January 24, 1951     PHYSICAL THERAPY DISCHARGE SUMMARY  Visits from Start of Care: 1  Current functional level related to goals / functional outcomes: See above   Remaining deficits: Unknown, pt cx scheduled appt and reported she would call back to reschedule and never did   Education / Equipment: n/a  Plan: Patient agrees to discharge.  Patient goals were not met. Patient is being discharged due to not returning since the last visit.  ?????    Laureen Abrahams, PT, DPT 07/30/16 8:29 AM  [facility] [address] [place], [place] [address]  [phone] (office) [phone] (fax)

## 2016-04-17 DIAGNOSIS — E113312 Type 2 diabetes mellitus with moderate nonproliferative diabetic retinopathy with macular edema, left eye: Secondary | ICD-10-CM | POA: Diagnosis not present

## 2016-04-18 ENCOUNTER — Ambulatory Visit: Payer: Medicare Other | Admitting: Physical Therapy

## 2016-04-18 DIAGNOSIS — E119 Type 2 diabetes mellitus without complications: Secondary | ICD-10-CM | POA: Diagnosis not present

## 2016-04-18 DIAGNOSIS — R809 Proteinuria, unspecified: Secondary | ICD-10-CM | POA: Diagnosis not present

## 2016-04-18 DIAGNOSIS — Z905 Acquired absence of kidney: Secondary | ICD-10-CM | POA: Diagnosis not present

## 2016-04-18 DIAGNOSIS — N183 Chronic kidney disease, stage 3 (moderate): Secondary | ICD-10-CM | POA: Diagnosis not present

## 2016-04-18 DIAGNOSIS — I10 Essential (primary) hypertension: Secondary | ICD-10-CM | POA: Diagnosis not present

## 2016-04-23 ENCOUNTER — Other Ambulatory Visit: Payer: Self-pay | Admitting: Neurology

## 2016-04-23 DIAGNOSIS — G6181 Chronic inflammatory demyelinating polyneuritis: Secondary | ICD-10-CM

## 2016-04-24 ENCOUNTER — Other Ambulatory Visit: Payer: Self-pay | Admitting: Neurology

## 2016-04-24 DIAGNOSIS — G6181 Chronic inflammatory demyelinating polyneuritis: Secondary | ICD-10-CM

## 2016-04-24 DIAGNOSIS — E1340 Other specified diabetes mellitus with diabetic neuropathy, unspecified: Secondary | ICD-10-CM

## 2016-04-24 DIAGNOSIS — G6189 Other inflammatory polyneuropathies: Secondary | ICD-10-CM

## 2016-04-24 NOTE — Telephone Encounter (Signed)
I also called pt to make sure that she is still taking elavil, no answer, did not leave another message.

## 2016-04-24 NOTE — Telephone Encounter (Signed)
I cannot find documentation in your last notes whether the elavil is to be continued. If it is to be continued, will you please add refills? Right now, it is ordered to dispense 30 day suppy and 0 refills.

## 2016-04-24 NOTE — Telephone Encounter (Signed)
I left VM for patient to call me back and confirm that she still takes Elavil.

## 2016-04-25 ENCOUNTER — Encounter: Payer: Self-pay | Admitting: Neurology

## 2016-04-30 ENCOUNTER — Other Ambulatory Visit: Payer: Self-pay | Admitting: Family Medicine

## 2016-04-30 ENCOUNTER — Encounter: Payer: Self-pay | Admitting: Neurology

## 2016-04-30 MED ORDER — ATORVASTATIN CALCIUM 10 MG PO TABS: ORAL_TABLET | ORAL | 0 refills | Status: DC

## 2016-04-30 NOTE — Telephone Encounter (Signed)
I called pt again to discuss. No answer, left a message asking her to call me back. 

## 2016-04-30 NOTE — Telephone Encounter (Signed)
I called pt. She says that she is still taking the elavil and has plenty of tablets. She is only taking 3 tablets qhs.

## 2016-04-30 NOTE — Telephone Encounter (Signed)
Pt returned RN's call °

## 2016-05-02 DIAGNOSIS — E113312 Type 2 diabetes mellitus with moderate nonproliferative diabetic retinopathy with macular edema, left eye: Secondary | ICD-10-CM | POA: Diagnosis not present

## 2016-05-23 DIAGNOSIS — L570 Actinic keratosis: Secondary | ICD-10-CM | POA: Diagnosis not present

## 2016-05-23 DIAGNOSIS — D170 Benign lipomatous neoplasm of skin and subcutaneous tissue of head, face and neck: Secondary | ICD-10-CM | POA: Diagnosis not present

## 2016-05-23 DIAGNOSIS — I872 Venous insufficiency (chronic) (peripheral): Secondary | ICD-10-CM | POA: Diagnosis not present

## 2016-05-23 DIAGNOSIS — X32XXXD Exposure to sunlight, subsequent encounter: Secondary | ICD-10-CM | POA: Diagnosis not present

## 2016-06-04 ENCOUNTER — Telehealth: Payer: Self-pay

## 2016-06-04 ENCOUNTER — Telehealth: Payer: Self-pay | Admitting: Internal Medicine

## 2016-06-04 NOTE — Telephone Encounter (Signed)
Called patient back to clarify previous message.

## 2016-06-04 NOTE — Telephone Encounter (Signed)
Called to clarify previous message from patient. Gave call back number.

## 2016-06-04 NOTE — Telephone Encounter (Signed)
Patient stated she anciently  Used her Lantus pen 22 units at bed time she is concerned about what to do when it comes times to use her humalog.  Her current b/s was 160  She used 10-20 units of humalog.  Please advise

## 2016-06-04 NOTE — Telephone Encounter (Signed)
Pt spoke with [facility] last night the accidentally took her lantus at bedtime instead of the humalog  She was just letting us know that she took the wrong insulin at the wrong time. No further action needed

## 2016-06-05 ENCOUNTER — Other Ambulatory Visit: Payer: Self-pay | Admitting: Neurology

## 2016-06-05 ENCOUNTER — Other Ambulatory Visit: Payer: Self-pay | Admitting: Family Medicine

## 2016-06-05 DIAGNOSIS — E113312 Type 2 diabetes mellitus with moderate nonproliferative diabetic retinopathy with macular edema, left eye: Secondary | ICD-10-CM | POA: Diagnosis not present

## 2016-06-05 MED ORDER — ATORVASTATIN CALCIUM 10 MG PO TABS: ORAL_TABLET | ORAL | 0 refills | Status: DC

## 2016-06-09 ENCOUNTER — Encounter: Payer: Self-pay | Admitting: Internal Medicine

## 2016-06-19 ENCOUNTER — Ambulatory Visit: Payer: Medicare Other | Admitting: Internal Medicine

## 2016-06-20 ENCOUNTER — Encounter: Payer: Self-pay | Admitting: Neurology

## 2016-06-26 DIAGNOSIS — M25531 Pain in right wrist: Secondary | ICD-10-CM | POA: Diagnosis not present

## 2016-06-26 DIAGNOSIS — M65312 Trigger thumb, left thumb: Secondary | ICD-10-CM | POA: Diagnosis not present

## 2016-06-26 DIAGNOSIS — M79641 Pain in right hand: Secondary | ICD-10-CM | POA: Diagnosis not present

## 2016-06-28 ENCOUNTER — Ambulatory Visit: Payer: Medicare Other | Admitting: Adult Health

## 2016-07-04 ENCOUNTER — Other Ambulatory Visit: Payer: Self-pay

## 2016-07-04 MED ORDER — ATORVASTATIN CALCIUM 10 MG PO TABS: ORAL_TABLET | ORAL | 0 refills | Status: DC

## 2016-07-10 DIAGNOSIS — E113312 Type 2 diabetes mellitus with moderate nonproliferative diabetic retinopathy with macular edema, left eye: Secondary | ICD-10-CM | POA: Diagnosis not present

## 2016-07-12 DIAGNOSIS — M65342 Trigger finger, left ring finger: Secondary | ICD-10-CM | POA: Diagnosis not present

## 2016-07-12 DIAGNOSIS — M65341 Trigger finger, right ring finger: Secondary | ICD-10-CM | POA: Diagnosis not present

## 2016-07-12 DIAGNOSIS — M65332 Trigger finger, left middle finger: Secondary | ICD-10-CM | POA: Diagnosis not present

## 2016-07-17 DIAGNOSIS — M65332 Trigger finger, left middle finger: Secondary | ICD-10-CM | POA: Diagnosis not present

## 2016-07-17 DIAGNOSIS — M65342 Trigger finger, left ring finger: Secondary | ICD-10-CM | POA: Diagnosis not present

## 2016-07-17 DIAGNOSIS — M65341 Trigger finger, right ring finger: Secondary | ICD-10-CM | POA: Diagnosis not present

## 2016-07-20 ENCOUNTER — Other Ambulatory Visit: Payer: Self-pay | Admitting: Neurology

## 2016-07-20 DIAGNOSIS — E1340 Other specified diabetes mellitus with diabetic neuropathy, unspecified: Secondary | ICD-10-CM

## 2016-07-20 DIAGNOSIS — G6189 Other inflammatory polyneuropathies: Secondary | ICD-10-CM

## 2016-07-20 DIAGNOSIS — G6181 Chronic inflammatory demyelinating polyneuritis: Secondary | ICD-10-CM

## 2016-07-30 ENCOUNTER — Other Ambulatory Visit: Payer: Self-pay | Admitting: Family Medicine

## 2016-08-13 ENCOUNTER — Other Ambulatory Visit: Payer: Self-pay | Admitting: Family Medicine

## 2016-08-31 ENCOUNTER — Other Ambulatory Visit: Payer: Self-pay | Admitting: Family Medicine

## 2016-09-03 ENCOUNTER — Other Ambulatory Visit: Payer: Self-pay | Admitting: Neurology

## 2016-09-17 DIAGNOSIS — E119 Type 2 diabetes mellitus without complications: Secondary | ICD-10-CM | POA: Diagnosis not present

## 2016-09-17 DIAGNOSIS — N183 Chronic kidney disease, stage 3 (moderate): Secondary | ICD-10-CM | POA: Diagnosis not present

## 2016-09-17 DIAGNOSIS — I10 Essential (primary) hypertension: Secondary | ICD-10-CM | POA: Diagnosis not present

## 2016-09-17 DIAGNOSIS — Z905 Acquired absence of kidney: Secondary | ICD-10-CM | POA: Diagnosis not present

## 2016-09-17 DIAGNOSIS — R809 Proteinuria, unspecified: Secondary | ICD-10-CM | POA: Diagnosis not present

## 2016-09-18 DIAGNOSIS — E113312 Type 2 diabetes mellitus with moderate nonproliferative diabetic retinopathy with macular edema, left eye: Secondary | ICD-10-CM | POA: Diagnosis not present

## 2016-09-20 ENCOUNTER — Encounter: Payer: Self-pay | Admitting: Adult Health

## 2016-10-02 ENCOUNTER — Ambulatory Visit (INDEPENDENT_AMBULATORY_CARE_PROVIDER_SITE_OTHER): Payer: Medicare Other | Admitting: Adult Health

## 2016-10-02 ENCOUNTER — Encounter: Payer: Self-pay | Admitting: Adult Health

## 2016-10-02 VITALS — BP 197/68 | HR 67

## 2016-10-02 DIAGNOSIS — R269 Unspecified abnormalities of gait and mobility: Secondary | ICD-10-CM | POA: Diagnosis not present

## 2016-10-02 DIAGNOSIS — E113511 Type 2 diabetes mellitus with proliferative diabetic retinopathy with macular edema, right eye: Secondary | ICD-10-CM | POA: Diagnosis not present

## 2016-10-02 DIAGNOSIS — G610 Guillain-Barre syndrome: Secondary | ICD-10-CM | POA: Diagnosis not present

## 2016-10-02 NOTE — Progress Notes (Signed)
I agree with the assessment and plan as directed by NP .The patient is known to me .   Calen Geister, MD  

## 2016-10-02 NOTE — Progress Notes (Signed)
PATIENT: Nicole Strong DOB: 1950/12/21  REASON FOR VISIT: follow up-Nicole Strong HISTORY FROM: patient  HISTORY OF PRESENT ILLNESS: Today 10/02/16 [name] is a 66 year old female with a history of Guillain-Barr Strong. She returns today for follow-up. She reports overall she has been stable. She denies any progressing symptoms. She continues to have weakness on the left side. She primarily ambulates with a wheelchair. She states inside her apartment she is able to ambulate by holding onto furniture. She denies any falls. She states that gabapentin is beneficial for her discomfort. She states despite taking this she will occasionally has sharp shooting pain in the legs. She is also on Elavil. She reports that she reduced her dose to 20 mg however she has noticed more problems sleeping at night. She was initially trying to get off the medication but feels that she may have to increase it in order to sleep at night. She is able to complete all ADLs independently. She does not operate a motor vehicle. Reports that she does have trouble getting in and out of a car due to weakness on the left side. She returns today for an evaluation.  HISTORY Interval history from 04/04/2016: Copied from Dr. Edwena Felty notes: I have the pleasure of seeing Nicole Strong today we'll has been followed in this office for over 3 years following a hospitalization for what turned out to be an axonal form of Guillain-Barr Strong. In 2015 she underwent EMG and nerve conduction studies to qualify for IVIG therapy under the assumption that she does have CIDP, but the results of the studies confirmed that axonal damage was present in that IVIG would not benefit her. She developed diabetes mellitus now is on insulin, hemoglobin A1c is at 6.7. She struggles to lose weight or at least to stop gaining weight. Of course, her exercise capacity has something to do with not being able to lose as much weight. Because of  gastroparesis she feels that she does not eat much and she avoids high sugar and high starch food.  Stressors : Her mother died in 02/26/23 and her cat on January 8th. Her sister is disputing the inheritance . Prior to this  time , she developed dizziness, orthostatic, with movement - the room was spinning around her. VERTIGO   .  REVIEW OF SYSTEMS: Out of a complete 14 system review of symptoms, the patient complains only of the following symptoms, and all other reviewed systems are negative.  See history of present illness  ALLERGIES: Allergies  Allergen Reactions  . Biaxin [Clarithromycin] Other (See Comments)    "face turns red"  . Codeine Nausea And Vomiting and Other (See Comments)    Hallucinations  . Erythromycin Nausea And Vomiting  . Influenza Vaccines     Had Guillene-Barre sd.  Celesta Gentile [Sitagliptin]   . Prednisone     Mania.  . Augmentin [Amoxicillin-Pot Clavulanate] Diarrhea    HOME MEDICATIONS: Outpatient Medications Prior to Visit  Medication Sig Dispense Refill  . albuterol (VENTOLIN HFA) 108 (90 BASE) MCG/ACT inhaler Inhale 2 puffs into the lungs every 6 (six) hours as needed for wheezing or shortness of breath. 1 Inhaler 1  . amitriptyline (ELAVIL) 10 MG tablet TAKE 4 TABLETS (40 MG TOTAL) BY MOUTH AT BEDTIME. 120 tablet 2  . atorvastatin (LIPITOR) 10 MG tablet Take 1 tablet by mouth 3 times a week. 12 tablet 0  . azelastine (ASTELIN) 0.1 % nasal spray Place 2 sprays into both nostrils 2 (two)  times daily. Use in each nostril as directed (Patient taking differently: Place 2 sprays into both nostrils 2 (two) times daily. Use in each nostril as needed.) 30 mL 12  . BD PEN NEEDLE NANO U/F 32G X 4 MM MISC USE 1X A DAY 100 each 1  . Blood Glucose Monitoring Suppl (ONETOUCH VERIO IQ SYSTEM) w/Device KIT Used to check sugar 2 times daily. 1 kit 0  . cephALEXin (KEFLEX) 500 MG capsule     . Cholecalciferol (VITAMIN D3) 50000 units CAPS Take 50,000 Units by mouth once a  week. 12 capsule 3  . COD LIVER OIL PO Take 1 capsule by mouth every evening.     . diclofenac (VOLTAREN) 50 MG EC tablet Take 50 mg by mouth 2 (two) times daily as needed for mild pain or moderate pain.     Marland Kitchen gabapentin (NEURONTIN) 300 MG capsule TAKE ONE CAPSULE BY MOUTH EVERY MORNING, 2 CAPSULES AT LUNCH AND 1 CAPSULE IN THE AFTERNOON AND 2 EVERY NIGHT AT BEDTIME 180 capsule 1  . glucose blood (ONETOUCH VERIO) test strip Use as instructed to check sugar 2 times daily 200 each 5  . HUMALOG KWIKPEN 100 UNIT/ML KiwkPen INJECT 10-15 UNITS THREE TIMES A DAY WITH MEALS 15 pen 4  . Insulin Glargine (LANTUS SOLOSTAR) 100 UNIT/ML Solostar Pen Inject 24 Units into the skin daily at 10 pm. (Patient taking differently: Inject 22 Units into the skin daily at 10 pm. ) 5 pen 5  . insulin lispro (HUMALOG KWIKPEN) 100 UNIT/ML KiwkPen Inject 10-15 units three times daily with meals. 15 pen 5  . ipratropium (ATROVENT HFA) 17 MCG/ACT inhaler Inhale 2 puffs into the lungs every 6 (six) hours as needed for wheezing. 1 Inhaler 2  . Lancets (ONETOUCH ULTRASOFT) lancets Use as instructed to check sugar 2 times daily. 200 each 5  . lisinopril (PRINIVIL,ZESTRIL) 20 MG tablet Take 20 mg by mouth at bedtime.     . meclizine (ANTIVERT) 25 MG tablet TAKE ONE TABLET BY MOUTH TWICE A DAY 20 tablet 0  . oxyCODONE-acetaminophen (PERCOCET/ROXICET) 5-325 MG per tablet Take 1 tablet by mouth every 4 (four) hours as needed. (Patient taking differently: Take 1 tablet by mouth every 4 (four) hours as needed for moderate pain or severe pain. ) 15 tablet 0  . promethazine (PHENERGAN) 25 MG tablet Take 1 tablet (25 mg total) by mouth every 8 (eight) hours as needed for nausea or vomiting. 90 tablet 3  . traMADol (ULTRAM) 50 MG tablet TAKE 1 EVERY MORNING AND 2 EVERY EVENING 90 tablet 2  . hydrochlorothiazide (HYDRODIURIL) 12.5 MG tablet Take 12.5-25 mg by mouth daily as needed (fluid).     . metFORMIN (GLUCOPHAGE-XR) 500 MG 24 hr tablet  TAKE 1 TABLET (500 MG TOTAL) BY MOUTH 2 (TWO) TIMES DAILY. (Patient not taking: Reported on 04/12/2016) 60 tablet 2  . mometasone (ELOCON) 0.1 % cream Apply 1 application topically daily. (Patient taking differently: Apply 1 application topically daily as needed (irritation). ) 45 g 1  . ULORIC 40 MG tablet TAKE ONE TABLET BY MOUTH DAILY. (Patient taking differently: TAKE 40 MG BY MOUTH EVERY EVENING) 30 tablet 5   No facility-administered medications prior to visit.     PAST MEDICAL HISTORY: Past Medical History:  Diagnosis Date  . Cancer ([place])   . 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 Diabetes mellitus    2  . Guillain-Barre Strong ([place])   . Hyperlipidemia   . Hypertension   .  Renal disorder    L kidney removed  . Renal mass    BEING WORKED UP BY UROLOGY  . 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 CESAREAN SECTION    . CHOLECYSTECTOMY    . NEPHRECTOMY      FAMILY HISTORY: Family History  Problem Relation Age of Onset  . Cancer Father        PROSTATE  . Healthy Mother   . Cancer Sister        BREAST    SOCIAL HISTORY: Social History   Social History  . Marital status: Divorced    Spouse name: N/A  . Number of children: 1  . Years of education: 50   Occupational History  .      disabled   Social History Main Topics  . Smoking status: Former Research scientist (life sciences)  . Smokeless tobacco: Never Used  . Alcohol use No  . Drug use: No  . Sexual activity: Not on file   Other Topics Concern  . Not on file   Social History Narrative   Patient lives at home alone.    Disabled.   Education college education.   Right handed.   Caffeine. Rare one cup.      PHYSICAL EXAM  Generalized: Well developed, in no acute distress   Neurological examination  Mentation: Alert oriented to time, place, history taking. Follows all commands speech and language fluent Cranial  nerve II-XII: Pupils were equal round reactive to light. Extraocular movements were full, visual field were full on confrontational test. Facial sensation and strength were normal. Uvula tongue midline. Head turning and shoulder shrug  were normal and symmetric. Motor: Motor testing reveals good strength mild weakness in the left upper and lower shin. Sensory: Sensory testing is intact to soft touch on all 4 extremities. No evidence of extinction is noted.  Coordination: Cerebellar testing reveals good finger-nose-finger and heel-to-shin bilaterally.  Gait and station: Patient is in a wheelchair. Reflexes: Deep tendon reflexes are symmetric and normal bilaterally.   DIAGNOSTIC DATA (LABS, IMAGING, TESTING) - I reviewed patient records, labs, notes, testing and imaging myself where available.  Lab Results  Component Value Date   WBC 7.2 01/20/2016   HGB 12.0 01/20/2016   HCT 34.3 (L) 01/20/2016   MCV 85.8 01/20/2016   PLT 229 01/20/2016      Component Value Date/Time   NA 138 01/20/2016 1602   NA 138 12/29/2014 1042   K 3.9 01/20/2016 1602   CL 103 01/20/2016 1602   CO2 28 01/20/2016 1602   GLUCOSE 166 (H) 01/20/2016 1602   BUN 30 (H) 01/20/2016 1602   BUN 20 12/29/2014 1042   CREATININE 1.62 (H) 01/20/2016 1602   CALCIUM 9.5 01/20/2016 1602   PROT 6.1 06/07/2015 1340   PROT 5.7 (L) 12/29/2014 1042   ALBUMIN 3.4 (L) 06/07/2015 1340   ALBUMIN 3.6 12/29/2014 1042   AST 13 06/07/2015 1340   ALT 13 06/07/2015 1340   ALKPHOS 79 06/07/2015 1340   BILITOT 0.5 06/07/2015 1340   BILITOT 0.4 12/29/2014 1042   GFRNONAA 32 (L) 01/20/2016 1602   GFRAA 37 (L) 01/20/2016 1602   Lab Results  Component Value Date   CHOL 192 03/20/2016   HDL 59.80 03/20/2016   LDLCALC 106 (H) 03/20/2016   LDLDIRECT 210.4 12/31/2013   TRIG 134.0 03/20/2016   CHOLHDL 3 03/20/2016   Lab Results  Component Value Date   HGBA1C 6.7 03/20/2016   Lab Results  Component Value Date   VITAMINB12 309  01/29/2010   Lab Results  Component Value Date   TSH 1.860 08/14/2013      ASSESSMENT AND PLAN 66 y.o. 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 here with:  1. Ethelene Hal Strong 2. Abnormality of gait  Overall the patient has remained stable. She will continue on gabapentin and Elavil. She is advised that if her symptoms worsen or she develops new symptoms she should let us know. She will follow-up in 6 months with Dr. Mechele Claude, MSN, NP-C 10/02/2016, 10:56 AM [facility] [address] [place], [place] [address] [phone]

## 2016-10-02 NOTE — Patient Instructions (Signed)
Your Plan:  Continue gabapentin and Elavil  If your symptoms worsen or you develop new symptoms please let us know.    Thank you for coming to see [place] at [facility]. I hope we have been able to provide you high quality care today.  You may receive a patient satisfaction survey over the next few weeks. We would appreciate your feedback and comments so that we may continue to improve ourselves and the health of our patients.

## 2016-10-08 DIAGNOSIS — H9313 Tinnitus, bilateral: Secondary | ICD-10-CM | POA: Diagnosis not present

## 2016-10-08 DIAGNOSIS — G610 Guillain-Barre syndrome: Secondary | ICD-10-CM | POA: Diagnosis not present

## 2016-10-08 DIAGNOSIS — E11319 Type 2 diabetes mellitus with unspecified diabetic retinopathy without macular edema: Secondary | ICD-10-CM | POA: Diagnosis not present

## 2016-10-08 DIAGNOSIS — H93291 Other abnormal auditory perceptions, right ear: Secondary | ICD-10-CM | POA: Diagnosis not present

## 2016-10-08 DIAGNOSIS — J310 Chronic rhinitis: Secondary | ICD-10-CM | POA: Diagnosis not present

## 2016-10-08 DIAGNOSIS — H903 Sensorineural hearing loss, bilateral: Secondary | ICD-10-CM | POA: Diagnosis not present

## 2016-10-10 ENCOUNTER — Encounter (HOSPITAL_COMMUNITY): Payer: Self-pay | Admitting: Emergency Medicine

## 2016-10-10 ENCOUNTER — Emergency Department (HOSPITAL_COMMUNITY): Payer: Medicare Other

## 2016-10-10 ENCOUNTER — Other Ambulatory Visit: Payer: Self-pay

## 2016-10-10 DIAGNOSIS — E785 Hyperlipidemia, unspecified: Secondary | ICD-10-CM | POA: Diagnosis not present

## 2016-10-10 DIAGNOSIS — Z885 Allergy status to narcotic agent status: Secondary | ICD-10-CM

## 2016-10-10 DIAGNOSIS — I5033 Acute on chronic diastolic (congestive) heart failure: Secondary | ICD-10-CM | POA: Diagnosis present

## 2016-10-10 DIAGNOSIS — Z803 Family history of malignant neoplasm of breast: Secondary | ICD-10-CM | POA: Diagnosis not present

## 2016-10-10 DIAGNOSIS — Z8042 Family history of malignant neoplasm of prostate: Secondary | ICD-10-CM | POA: Diagnosis not present

## 2016-10-10 DIAGNOSIS — D631 Anemia in chronic kidney disease: Secondary | ICD-10-CM | POA: Diagnosis present

## 2016-10-10 DIAGNOSIS — N179 Acute kidney failure, unspecified: Secondary | ICD-10-CM | POA: Diagnosis present

## 2016-10-10 DIAGNOSIS — Z85528 Personal history of other malignant neoplasm of kidney: Secondary | ICD-10-CM

## 2016-10-10 DIAGNOSIS — Z91128 Patient's intentional underdosing of medication regimen for other reason: Secondary | ICD-10-CM | POA: Diagnosis not present

## 2016-10-10 DIAGNOSIS — T501X6A Underdosing of loop [high-ceiling] diuretics, initial encounter: Secondary | ICD-10-CM | POA: Diagnosis not present

## 2016-10-10 DIAGNOSIS — Z905 Acquired absence of kidney: Secondary | ICD-10-CM | POA: Diagnosis not present

## 2016-10-10 DIAGNOSIS — Z87891 Personal history of nicotine dependence: Secondary | ICD-10-CM | POA: Diagnosis not present

## 2016-10-10 DIAGNOSIS — I130 Hypertensive heart and chronic kidney disease with heart failure and stage 1 through stage 4 chronic kidney disease, or unspecified chronic kidney disease: Principal | ICD-10-CM | POA: Diagnosis present

## 2016-10-10 DIAGNOSIS — R0602 Shortness of breath: Secondary | ICD-10-CM | POA: Diagnosis not present

## 2016-10-10 DIAGNOSIS — Z794 Long term (current) use of insulin: Secondary | ICD-10-CM | POA: Diagnosis not present

## 2016-10-10 DIAGNOSIS — J9621 Acute and chronic respiratory failure with hypoxia: Secondary | ICD-10-CM | POA: Diagnosis present

## 2016-10-10 DIAGNOSIS — E1122 Type 2 diabetes mellitus with diabetic chronic kidney disease: Secondary | ICD-10-CM | POA: Diagnosis present

## 2016-10-10 DIAGNOSIS — F329 Major depressive disorder, single episode, unspecified: Secondary | ICD-10-CM | POA: Diagnosis present

## 2016-10-10 DIAGNOSIS — N184 Chronic kidney disease, stage 4 (severe): Secondary | ICD-10-CM | POA: Diagnosis present

## 2016-10-10 DIAGNOSIS — Z6841 Body Mass Index (BMI) 40.0 and over, adult: Secondary | ICD-10-CM

## 2016-10-10 DIAGNOSIS — E662 Morbid (severe) obesity with alveolar hypoventilation: Secondary | ICD-10-CM | POA: Diagnosis present

## 2016-10-10 DIAGNOSIS — Z881 Allergy status to other antibiotic agents status: Secondary | ICD-10-CM | POA: Diagnosis not present

## 2016-10-10 DIAGNOSIS — Y92009 Unspecified place in unspecified non-institutional (private) residence as the place of occurrence of the external cause: Secondary | ICD-10-CM

## 2016-10-10 NOTE — ED Triage Notes (Signed)
Pt states that for several days she has had SOB and redness around her L eye. Alert and oriented.

## 2016-10-11 ENCOUNTER — Inpatient Hospital Stay (HOSPITAL_COMMUNITY): Payer: Medicare Other

## 2016-10-11 ENCOUNTER — Encounter (HOSPITAL_COMMUNITY): Payer: Self-pay | Admitting: Internal Medicine

## 2016-10-11 ENCOUNTER — Inpatient Hospital Stay (HOSPITAL_COMMUNITY)
Admission: EM | Admit: 2016-10-11 | Discharge: 2016-10-18 | DRG: 291 | Disposition: A | Discharge status: Home Health | Payer: Medicare Other | Attending: Internal Medicine | Admitting: Internal Medicine

## 2016-10-11 DIAGNOSIS — N189 Chronic kidney disease, unspecified: Secondary | ICD-10-CM | POA: Diagnosis not present

## 2016-10-11 DIAGNOSIS — R0602 Shortness of breath: Secondary | ICD-10-CM

## 2016-10-11 DIAGNOSIS — N171 Acute kidney failure with acute cortical necrosis: Secondary | ICD-10-CM | POA: Diagnosis not present

## 2016-10-11 DIAGNOSIS — E1122 Type 2 diabetes mellitus with diabetic chronic kidney disease: Secondary | ICD-10-CM | POA: Diagnosis not present

## 2016-10-11 DIAGNOSIS — Z794 Long term (current) use of insulin: Secondary | ICD-10-CM | POA: Diagnosis not present

## 2016-10-11 DIAGNOSIS — Z803 Family history of malignant neoplasm of breast: Secondary | ICD-10-CM | POA: Diagnosis not present

## 2016-10-11 DIAGNOSIS — N183 Chronic kidney disease, stage 3 (moderate): Secondary | ICD-10-CM | POA: Diagnosis not present

## 2016-10-11 DIAGNOSIS — I129 Hypertensive chronic kidney disease with stage 1 through stage 4 chronic kidney disease, or unspecified chronic kidney disease: Secondary | ICD-10-CM | POA: Diagnosis not present

## 2016-10-11 DIAGNOSIS — N179 Acute kidney failure, unspecified: Secondary | ICD-10-CM

## 2016-10-11 DIAGNOSIS — Z881 Allergy status to other antibiotic agents status: Secondary | ICD-10-CM | POA: Diagnosis not present

## 2016-10-11 DIAGNOSIS — Z91128 Patient's intentional underdosing of medication regimen for other reason: Secondary | ICD-10-CM | POA: Diagnosis not present

## 2016-10-11 DIAGNOSIS — I10 Essential (primary) hypertension: Secondary | ICD-10-CM | POA: Diagnosis not present

## 2016-10-11 DIAGNOSIS — E8770 Fluid overload, unspecified: Secondary | ICD-10-CM | POA: Diagnosis not present

## 2016-10-11 DIAGNOSIS — I509 Heart failure, unspecified: Secondary | ICD-10-CM | POA: Diagnosis not present

## 2016-10-11 DIAGNOSIS — F329 Major depressive disorder, single episode, unspecified: Secondary | ICD-10-CM | POA: Diagnosis present

## 2016-10-11 DIAGNOSIS — R0600 Dyspnea, unspecified: Secondary | ICD-10-CM | POA: Diagnosis not present

## 2016-10-11 DIAGNOSIS — Z85528 Personal history of other malignant neoplasm of kidney: Secondary | ICD-10-CM | POA: Diagnosis not present

## 2016-10-11 DIAGNOSIS — D631 Anemia in chronic kidney disease: Secondary | ICD-10-CM | POA: Diagnosis present

## 2016-10-11 DIAGNOSIS — Z87891 Personal history of nicotine dependence: Secondary | ICD-10-CM | POA: Diagnosis not present

## 2016-10-11 DIAGNOSIS — N182 Chronic kidney disease, stage 2 (mild): Secondary | ICD-10-CM | POA: Diagnosis not present

## 2016-10-11 DIAGNOSIS — R109 Unspecified abdominal pain: Secondary | ICD-10-CM | POA: Diagnosis not present

## 2016-10-11 DIAGNOSIS — IMO0002 Reserved for concepts with insufficient information to code with codable children: Secondary | ICD-10-CM

## 2016-10-11 DIAGNOSIS — Z6841 Body Mass Index (BMI) 40.0 and over, adult: Secondary | ICD-10-CM | POA: Diagnosis not present

## 2016-10-11 DIAGNOSIS — T501X6A Underdosing of loop [high-ceiling] diuretics, initial encounter: Secondary | ICD-10-CM | POA: Diagnosis present

## 2016-10-11 DIAGNOSIS — E1165 Type 2 diabetes mellitus with hyperglycemia: Secondary | ICD-10-CM | POA: Diagnosis not present

## 2016-10-11 DIAGNOSIS — G610 Guillain-Barre syndrome: Secondary | ICD-10-CM | POA: Diagnosis not present

## 2016-10-11 DIAGNOSIS — Z8042 Family history of malignant neoplasm of prostate: Secondary | ICD-10-CM | POA: Diagnosis not present

## 2016-10-11 DIAGNOSIS — Z885 Allergy status to narcotic agent status: Secondary | ICD-10-CM | POA: Diagnosis not present

## 2016-10-11 DIAGNOSIS — E1140 Type 2 diabetes mellitus with diabetic neuropathy, unspecified: Secondary | ICD-10-CM | POA: Diagnosis not present

## 2016-10-11 DIAGNOSIS — J9621 Acute and chronic respiratory failure with hypoxia: Secondary | ICD-10-CM | POA: Diagnosis present

## 2016-10-11 DIAGNOSIS — Y92009 Unspecified place in unspecified non-institutional (private) residence as the place of occurrence of the external cause: Secondary | ICD-10-CM | POA: Diagnosis not present

## 2016-10-11 DIAGNOSIS — R918 Other nonspecific abnormal finding of lung field: Secondary | ICD-10-CM | POA: Diagnosis not present

## 2016-10-11 DIAGNOSIS — I5033 Acute on chronic diastolic (congestive) heart failure: Secondary | ICD-10-CM | POA: Diagnosis not present

## 2016-10-11 DIAGNOSIS — Z905 Acquired absence of kidney: Secondary | ICD-10-CM | POA: Diagnosis not present

## 2016-10-11 DIAGNOSIS — I130 Hypertensive heart and chronic kidney disease with heart failure and stage 1 through stage 4 chronic kidney disease, or unspecified chronic kidney disease: Secondary | ICD-10-CM | POA: Diagnosis present

## 2016-10-11 DIAGNOSIS — E662 Morbid (severe) obesity with alveolar hypoventilation: Secondary | ICD-10-CM | POA: Diagnosis present

## 2016-10-11 DIAGNOSIS — N184 Chronic kidney disease, stage 4 (severe): Secondary | ICD-10-CM | POA: Diagnosis not present

## 2016-10-11 DIAGNOSIS — E785 Hyperlipidemia, unspecified: Secondary | ICD-10-CM | POA: Diagnosis present

## 2016-10-11 LAB — BASIC METABOLIC PANEL
ANION GAP: 7 (ref 5–15)
BUN: 26 mg/dL — AB (ref 6–20)
CHLORIDE: 108 mmol/L (ref 101–111)
CO2: 30 mmol/L (ref 22–32)
Calcium: 8.7 mg/dL — ABNORMAL LOW (ref 8.9–10.3)
Creatinine, Ser: 2.13 mg/dL — ABNORMAL HIGH (ref 0.44–1.00)
GFR, EST AFRICAN AMERICAN: 26 mL/min — AB (ref >60)
GFR, EST NON AFRICAN AMERICAN: 23 mL/min — AB (ref >60)
Glucose, Bld: 169 mg/dL — ABNORMAL HIGH (ref 65–99)
POTASSIUM: 3.9 mmol/L (ref 3.5–5.1)
SODIUM: 145 mmol/L (ref 135–145)

## 2016-10-11 LAB — CBC
HEMATOCRIT: 39.2 % (ref 36.0–46.0)
Hemoglobin: 13.1 g/dL (ref 12.0–15.0)
MCH: 29.8 pg (ref 26.0–34.0)
MCHC: 33.4 g/dL (ref 30.0–36.0)
MCV: 89.1 fL (ref 78.0–100.0)
Platelets: 248 10*3/uL (ref 150–400)
RBC: 4.4 MIL/uL (ref 3.87–5.11)
RDW: 14.2 % (ref 11.5–15.5)
WBC: 8.2 10*3/uL (ref 4.0–10.5)

## 2016-10-11 LAB — CBG MONITORING, ED
GLUCOSE-CAPILLARY: 180 mg/dL — AB (ref 65–99)
GLUCOSE-CAPILLARY: 203 mg/dL — AB (ref 65–99)

## 2016-10-11 LAB — POCT I-STAT TROPONIN I: TROPONIN I, POC: 0 ng/mL (ref 0.00–0.08)

## 2016-10-11 LAB — CBC WITH DIFFERENTIAL/PLATELET
BASOS ABS: 0 10*3/uL (ref 0.0–0.1)
Basophils Relative: 0 %
EOS ABS: 0.2 10*3/uL (ref 0.0–0.7)
EOS PCT: 3 %
HCT: 33.5 % — ABNORMAL LOW (ref 36.0–46.0)
HEMOGLOBIN: 11.3 g/dL — AB (ref 12.0–15.0)
LYMPHS ABS: 1.4 10*3/uL (ref 0.7–4.0)
Lymphocytes Relative: 22 %
MCH: 30 pg (ref 26.0–34.0)
MCHC: 33.7 g/dL (ref 30.0–36.0)
MCV: 88.9 fL (ref 78.0–100.0)
Monocytes Absolute: 0.4 10*3/uL (ref 0.1–1.0)
Monocytes Relative: 7 %
NEUTROS PCT: 68 %
Neutro Abs: 4.4 10*3/uL (ref 1.7–7.7)
PLATELETS: 211 10*3/uL (ref 150–400)
RBC: 3.77 MIL/uL — AB (ref 3.87–5.11)
RDW: 14 % (ref 11.5–15.5)
WBC: 6.5 10*3/uL (ref 4.0–10.5)

## 2016-10-11 LAB — GLUCOSE, CAPILLARY
GLUCOSE-CAPILLARY: 188 mg/dL — AB (ref 65–99)
Glucose-Capillary: 201 mg/dL — ABNORMAL HIGH (ref 65–99)

## 2016-10-11 LAB — MAGNESIUM: Magnesium: 2 mg/dL (ref 1.7–2.4)

## 2016-10-11 LAB — TROPONIN I: Troponin I: <0.03 ng/mL (ref <0.03)

## 2016-10-11 LAB — CREATININE, URINE, RANDOM: CREATININE, URINE: 23.6 mg/dL

## 2016-10-11 LAB — SODIUM, URINE, RANDOM: SODIUM UR: 118 mmol/L

## 2016-10-11 LAB — CREATININE, SERUM
Creatinine, Ser: 2.05 mg/dL — ABNORMAL HIGH (ref 0.44–1.00)
GFR calc Af Amer: 28 mL/min — ABNORMAL LOW (ref >60)
GFR, EST NON AFRICAN AMERICAN: 24 mL/min — AB (ref >60)

## 2016-10-11 LAB — BRAIN NATRIURETIC PEPTIDE: B NATRIURETIC PEPTIDE 5: 146.6 pg/mL — AB (ref 0.0–100.0)

## 2016-10-11 LAB — TSH: TSH: 3.631 u[IU]/mL (ref 0.350–4.500)

## 2016-10-11 MED ORDER — POLYETHYLENE GLYCOL 3350 17 G PO PACK
17.0000 g | PACK | Freq: Every day | ORAL | Status: DC
  Administered 2016-10-11 – 2016-10-18 (×7): 17 g via ORAL
  Filled 2016-10-11 (×7): qty 1

## 2016-10-11 MED ORDER — GABAPENTIN 300 MG PO CAPS
600.0000 mg | ORAL_CAPSULE | Freq: Every day | ORAL | Status: DC
  Administered 2016-10-11 – 2016-10-18 (×8): 600 mg via ORAL
  Filled 2016-10-11 (×8): qty 2

## 2016-10-11 MED ORDER — ACETAMINOPHEN 325 MG PO TABS
650.0000 mg | ORAL_TABLET | Freq: Four times a day (QID) | ORAL | Status: DC | PRN
  Administered 2016-10-17: 650 mg via ORAL
  Filled 2016-10-11: qty 2

## 2016-10-11 MED ORDER — FUROSEMIDE 10 MG/ML IJ SOLN
40.0000 mg | INTRAMUSCULAR | Status: AC
  Administered 2016-10-11: 40 mg via INTRAVENOUS
  Filled 2016-10-11: qty 4

## 2016-10-11 MED ORDER — ONDANSETRON HCL 4 MG PO TABS: 4.0000 mg | ORAL_TABLET | Freq: Four times a day (QID) | ORAL | Status: DC | PRN

## 2016-10-11 MED ORDER — GABAPENTIN 300 MG PO CAPS
300.0000 mg | ORAL_CAPSULE | Freq: Every day | ORAL | Status: DC
  Administered 2016-10-11 – 2016-10-18 (×8): 300 mg via ORAL
  Filled 2016-10-11 (×8): qty 1

## 2016-10-11 MED ORDER — GABAPENTIN 300 MG PO CAPS
600.0000 mg | ORAL_CAPSULE | Freq: Every day | ORAL | Status: DC
  Administered 2016-10-11 – 2016-10-17 (×7): 600 mg via ORAL
  Filled 2016-10-11 (×7): qty 2

## 2016-10-11 MED ORDER — GABAPENTIN 300 MG PO CAPS
300.0000 mg | ORAL_CAPSULE | Freq: Every day | ORAL | Status: DC
  Administered 2016-10-11 – 2016-10-12 (×2): 300 mg via ORAL
  Filled 2016-10-11 (×2): qty 1

## 2016-10-11 MED ORDER — FUROSEMIDE 10 MG/ML IJ SOLN
40.0000 mg | Freq: Two times a day (BID) | INTRAMUSCULAR | Status: DC
  Administered 2016-10-11 – 2016-10-12 (×3): 40 mg via INTRAVENOUS
  Filled 2016-10-11 (×3): qty 4

## 2016-10-11 MED ORDER — TRAMADOL HCL 50 MG PO TABS
50.0000 mg | ORAL_TABLET | Freq: Every day | ORAL | Status: DC
  Administered 2016-10-11 – 2016-10-18 (×8): 50 mg via ORAL
  Filled 2016-10-11 (×8): qty 1

## 2016-10-11 MED ORDER — HEPARIN SODIUM (PORCINE) 5000 UNIT/ML IJ SOLN
5000.0000 [IU] | Freq: Three times a day (TID) | INTRAMUSCULAR | Status: DC
  Administered 2016-10-11 – 2016-10-18 (×21): 5000 [IU] via SUBCUTANEOUS
  Filled 2016-10-11 (×25): qty 1

## 2016-10-11 MED ORDER — ACETAMINOPHEN 650 MG RE SUPP: 650.0000 mg | Freq: Four times a day (QID) | RECTAL | Status: DC | PRN

## 2016-10-11 MED ORDER — HYDRALAZINE HCL 20 MG/ML IJ SOLN
10.0000 mg | INTRAMUSCULAR | Status: DC | PRN
  Administered 2016-10-11 – 2016-10-13 (×3): 10 mg via INTRAVENOUS
  Filled 2016-10-11 (×2): qty 1
  Filled 2016-10-11: qty 0.5

## 2016-10-11 MED ORDER — ONDANSETRON HCL 4 MG/2ML IJ SOLN: 4.0000 mg | Freq: Four times a day (QID) | INTRAMUSCULAR | Status: DC | PRN

## 2016-10-11 MED ORDER — ATORVASTATIN CALCIUM 10 MG PO TABS
10.0000 mg | ORAL_TABLET | ORAL | Status: DC
  Administered 2016-10-12 – 2016-10-17 (×3): 10 mg via ORAL
  Filled 2016-10-11 (×3): qty 1

## 2016-10-11 MED ORDER — PROMETHAZINE HCL 25 MG PO TABS: 25.0000 mg | ORAL_TABLET | Freq: Three times a day (TID) | ORAL | Status: DC | PRN

## 2016-10-11 MED ORDER — AZELASTINE HCL 0.1 % NA SOLN
2.0000 | Freq: Two times a day (BID) | NASAL | Status: DC | PRN
  Administered 2016-10-13 – 2016-10-18 (×3): 2 via NASAL
  Filled 2016-10-11: qty 30

## 2016-10-11 MED ORDER — TRAMADOL HCL 50 MG PO TABS
100.0000 mg | ORAL_TABLET | Freq: Every evening | ORAL | Status: DC
  Administered 2016-10-11 – 2016-10-17 (×7): 100 mg via ORAL
  Filled 2016-10-11 (×7): qty 2

## 2016-10-11 MED ORDER — INSULIN ASPART 100 UNIT/ML ~~LOC~~ SOLN
0.0000 [IU] | Freq: Three times a day (TID) | SUBCUTANEOUS | Status: DC
  Administered 2016-10-11 (×2): 2 [IU] via SUBCUTANEOUS
  Administered 2016-10-11 – 2016-10-12 (×2): 3 [IU] via SUBCUTANEOUS
  Administered 2016-10-12 – 2016-10-13 (×2): 2 [IU] via SUBCUTANEOUS
  Administered 2016-10-13: 3 [IU] via SUBCUTANEOUS
  Administered 2016-10-14 – 2016-10-15 (×4): 2 [IU] via SUBCUTANEOUS
  Administered 2016-10-15: 3 [IU] via SUBCUTANEOUS
  Administered 2016-10-16: 1 [IU] via SUBCUTANEOUS
  Administered 2016-10-16 (×2): 3 [IU] via SUBCUTANEOUS
  Administered 2016-10-17 (×2): 1 [IU] via SUBCUTANEOUS
  Administered 2016-10-17: 2 [IU] via SUBCUTANEOUS
  Administered 2016-10-18: 1 [IU] via SUBCUTANEOUS
  Administered 2016-10-18: 3 [IU] via SUBCUTANEOUS
  Filled 2016-10-11 (×2): qty 1

## 2016-10-11 MED ORDER — AMITRIPTYLINE HCL 10 MG PO TABS
40.0000 mg | ORAL_TABLET | Freq: Every day | ORAL | Status: DC
  Administered 2016-10-11 – 2016-10-17 (×7): 40 mg via ORAL
  Filled 2016-10-11 (×8): qty 4

## 2016-10-11 MED ORDER — INSULIN GLARGINE 100 UNIT/ML ~~LOC~~ SOLN
15.0000 [IU] | Freq: Every day | SUBCUTANEOUS | Status: DC
  Administered 2016-10-11 – 2016-10-17 (×7): 15 [IU] via SUBCUTANEOUS
  Filled 2016-10-11 (×8): qty 0.15

## 2016-10-11 MED ORDER — DOCUSATE SODIUM 100 MG PO CAPS
100.0000 mg | ORAL_CAPSULE | Freq: Every day | ORAL | Status: DC
  Administered 2016-10-11 – 2016-10-18 (×7): 100 mg via ORAL
  Filled 2016-10-11 (×7): qty 1

## 2016-10-11 NOTE — ED Notes (Signed)
Report [id number]. [facility] RN [phone]

## 2016-10-11 NOTE — ED Notes (Signed)
ED TO INPATIENT HANDOFF REPORT  Name/Age/Gender Nicole Strong 66 y.o. female  Code Status    Code Status Orders        Start     Ordered   10/11/16 0417  Full code  Continuous     10/11/16 0417    Code Status History    Date Active Date Inactive Code Status Order ID Comments User Context   08/14/2013  4:02 AM 08/15/2013  4:09 PM Full Code [id number]  Toy Baker, MD Inpatient   05/31/2012  2:56 AM 06/01/2012  8:51 PM Full Code [id number]  Theressa Millard, MD ED      Home/SNF/Other Home  Chief Complaint SOB  Level of Care/Admitting Diagnosis ED Disposition    ED Disposition Condition [facility] Area: [facility] [[id number]]  Level of Care: Telemetry [5]  Admit to tele based on following criteria: Acute CHF  Diagnosis: CHF (congestive heart failure) [facility]) [[id number]]  Admitting Physician: Rise Patience [phone]  Attending Physician: Rise Patience [phone]  Estimated length of stay: past midnight tomorrow  Certification:: I certify this patient will need inpatient services for at least 2 midnights  PT Class (Do Not Modify): Inpatient [101]  PT Acc Code (Do Not Modify): Private [1]       Medical History Past Medical History:  Diagnosis Date  . Cancer ([place])   . 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 Diabetes mellitus    2  . 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 Hyperlipidemia   . Hypertension   . Renal disorder    L kidney removed  . Renal mass    BEING WORKED UP BY UROLOGY  . 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 Biaxin [Clarithromycin] Other (See Comments)    "face turns red"  . Ciprofloxacin Other (See Comments)    Had Guillene-Barre sd.  . Codeine Nausea And Vomiting and Other (See Comments)    Hallucinations  . Erythromycin Nausea And Vomiting  . Influenza Vaccines     Had Guillene-Barre sd.  Celesta Gentile [Sitagliptin]   .  Prednisone     Mania.  . Augmentin [Amoxicillin-Pot Clavulanate] Diarrhea    IV Location/Drains/Wounds Patient Lines/Drains/Airways Status   Active Line/Drains/Airways    Name:   Placement date:   Placement time:   Site:   Days:   Peripheral IV 10/11/16 Right Antecubital  10/11/16    0415    Antecubital    less than 1          Labs/Imaging Results for orders placed or performed during the hospital encounter of 10/11/16 (from the past 48 hour(s))  Brain natriuretic peptide     Status: Abnormal   Collection Time: 10/11/16  1:43 AM  Result Value Ref Range   B Natriuretic Peptide 146.6 (H) 0.0 - 100.0 pg/mL  CBC with Differential/Platelet     Status: Abnormal   Collection Time: 10/11/16  1:48 AM  Result Value Ref Range   WBC 6.5 4.0 - 10.5 K/uL   RBC 3.77 (L) 3.87 - 5.11 MIL/uL   Hemoglobin 11.3 (L) 12.0 - 15.0 g/dL   HCT 33.5 (L) 36.0 - 46.0 %   MCV 88.9 78.0 - 100.0 fL   MCH 30.0 26.0 - 34.0 pg   MCHC 33.7 30.0 - 36.0 g/dL   RDW 14.0 11.5 - 15.5 %   Platelets 211 150 - 400  K/uL   Neutrophils Relative % 68 %   Neutro Abs 4.4 1.7 - 7.7 K/uL   Lymphocytes Relative 22 %   Lymphs Abs 1.4 0.7 - 4.0 K/uL   Monocytes Relative 7 %   Monocytes Absolute 0.4 0.1 - 1.0 K/uL   Eosinophils Relative 3 %   Eosinophils Absolute 0.2 0.0 - 0.7 K/uL   Basophils Relative 0 %   Basophils Absolute 0.0 0.0 - 0.1 K/uL  Basic metabolic panel     Status: Abnormal   Collection Time: 10/11/16  1:48 AM  Result Value Ref Range   Sodium 145 135 - 145 mmol/L   Potassium 3.9 3.5 - 5.1 mmol/L   Chloride 108 101 - 111 mmol/L   CO2 30 22 - 32 mmol/L   Glucose, Bld 169 (H) 65 - 99 mg/dL   BUN 26 (H) 6 - 20 mg/dL   Creatinine, Ser 2.13 (H) 0.44 - 1.00 mg/dL   Calcium 8.7 (L) 8.9 - 10.3 mg/dL   GFR calc non Af Amer 23 (L) >60 mL/min   GFR calc Af Amer 26 (L) >60 mL/min    Comment: (NOTE) The eGFR has been calculated using the CKD EPI equation. This calculation has not been validated in all clinical  situations. eGFR's persistently <60 mL/min signify possible Chronic Kidney Disease.    Anion gap 7 5 - 15  POCT i-Stat troponin I     Status: None   Collection Time: 10/11/16  1:54 AM  Result Value Ref Range   Troponin i, poc 0.00 0.00 - 0.08 ng/mL   Comment 3            Comment: Due to the release kinetics of cTnI, a negative result within the first hours of the onset of symptoms does not rule out myocardial infarction with certainty. If myocardial infarction is still suspected, repeat the test at appropriate intervals.   Sodium, urine, random     Status: None   Collection Time: 10/11/16  4:22 AM  Result Value Ref Range   Sodium, Ur 118 mmol/L    Comment: Performed at [facility] [address] [place], [place] [address]  Creatinine, urine, random     Status: None   Collection Time: 10/11/16  4:22 AM  Result Value Ref Range   Creatinine, Urine 23.60 mg/dL    Comment: Performed at [facility] [address], [place], [place] [address]  Troponin I (q 6hr x 3)     Status: None   Collection Time: 10/11/16  4:31 AM  Result Value Ref Range   Troponin I <0.03 <0.03 ng/mL  CBC     Status: None   Collection Time: 10/11/16  4:31 AM  Result Value Ref Range   WBC 8.2 4.0 - 10.5 K/uL   RBC 4.40 3.87 - 5.11 MIL/uL   Hemoglobin 13.1 12.0 - 15.0 g/dL   HCT 39.2 36.0 - 46.0 %   MCV 89.1 78.0 - 100.0 fL   MCH 29.8 26.0 - 34.0 pg   MCHC 33.4 30.0 - 36.0 g/dL   RDW 14.2 11.5 - 15.5 %   Platelets 248 150 - 400 K/uL  Creatinine, serum     Status: Abnormal   Collection Time: 10/11/16  4:31 AM  Result Value Ref Range   Creatinine, Ser 2.05 (H) 0.44 - 1.00 mg/dL   GFR calc non Af Amer 24 (L) >60 mL/min   GFR calc Af Amer 28 (L) >60 mL/min    Comment: (  NOTE) The eGFR has been calculated using the CKD EPI equation. This calculation has not been validated in all clinical situations. eGFR's persistently <60 mL/min signify possible Chronic Kidney Disease.   TSH     Status:  None   Collection Time: 10/11/16  4:31 AM  Result Value Ref Range   TSH 3.631 0.350 - 4.500 uIU/mL    Comment: Performed by a 3rd Generation assay with a functional sensitivity of <=0.01 uIU/mL.  Magnesium     Status: None   Collection Time: 10/11/16  4:31 AM  Result Value Ref Range   Magnesium 2.0 1.7 - 2.4 mg/dL  CBG monitoring, ED     Status: Abnormal   Collection Time: 10/11/16  7:43 AM  Result Value Ref Range   Glucose-Capillary 180 (H) 65 - 99 mg/dL  Troponin I (q 6hr x 3)     Status: None   Collection Time: 10/11/16 10:14 AM  Result Value Ref Range   Troponin I <0.03 <0.03 ng/mL  CBG monitoring, ED     Status: Abnormal   Collection Time: 10/11/16  2:49 PM  Result Value Ref Range   Glucose-Capillary 203 (H) 65 - 99 mg/dL  Troponin I (q 6hr x 3)     Status: None   Collection Time: 10/11/16  4:28 PM  Result Value Ref Range   Troponin I <0.03 <0.03 ng/mL   Ct Abdomen Pelvis Wo Contrast  Result Date: 10/11/2016 CLINICAL DATA:  Flank pain, question stones EXAM: CT ABDOMEN AND PELVIS WITHOUT CONTRAST TECHNIQUE: Multidetector CT imaging of the abdomen and pelvis was performed following the standard protocol without IV contrast. COMPARISON:  CT 12/11/2013 FINDINGS: Lower chest: Small bilateral pleural effusions. Heart is normal size. Bibasilar atelectasis. Mitral valve annular calcifications noted. Hepatobiliary: No focal liver abnormality is seen. Status post cholecystectomy. No biliary dilatation. Pancreas: No focal abnormality or ductal dilatation. Spleen: No focal abnormality.  Normal size. Adrenals/Urinary Tract: Prior left nephrectomy. No renal or ureteral stones on the right. No hydronephrosis. 16 mm low-density nodule in the right adrenal gland compatible with adenoma. Small adenoma also in the left adrenal gland. Urinary bladder unremarkable. Stomach/Bowel: Normal appendix. Stomach, large and small bowel grossly unremarkable. Vascular/Lymphatic: No evidence of aneurysm or  adenopathy. Aortic and iliac calcifications. Reproductive: Uterus and adnexa unremarkable.  No mass. Other: No free fluid or free air. Musculoskeletal: No acute bony abnormality. IMPRESSION: Prior left nephrectomy.  No right ureteral stones or hydronephrosis. Small bilateral adrenal adenomas. Small bilateral pleural effusions.  Bibasilar atelectasis. Electronically Signed   By: Rolm Baptise M.D.   On: 10/11/2016 10:08   Dg Chest 2 View  Result Date: 10/10/2016 CLINICAL DATA:  Shortness of breath EXAM: CHEST  2 VIEW COMPARISON:  01/27/2015 FINDINGS: Low lung volumes. Tiny left greater than right pleural effusions. Hazy atelectasis or infiltrate at the left base. Borderline cardiomegaly with aortic atherosclerosis. No pneumothorax. IMPRESSION: 1. Small left greater than right pleural effusions. Hazy atelectasis or infiltrate at the left base 2. Borderline cardiomegaly Electronically Signed   By: Donavan Foil M.D.   On: 10/10/2016 18:00    Pending Labs [employer]    Start     Ordered   10/12/16 9233  Basic metabolic panel  Daily,   R     10/11/16 0417      Vitals/Pain Today's Vitals   10/11/16 1640 10/11/16 1643 10/11/16 1700 10/11/16 1701  BP: (!) 159/99 (!) 159/99 (!) 173/48   Pulse: 76 75  76  Resp: 20 18  (!)  22  Temp:      TempSrc:      SpO2: 100% 100%  100%  Weight:      Height:      PainSc:  7       Isolation Precautions No active isolations  Medications Medications  amitriptyline (ELAVIL) tablet 40 mg (not administered)  atorvastatin (LIPITOR) tablet 10 mg (not administered)  traMADol (ULTRAM) tablet 50 mg (50 mg Oral Given 10/11/16 1004)  promethazine (PHENERGAN) tablet 25 mg (not administered)  insulin glargine (LANTUS) injection 15 Units (not administered)  azelastine (ASTELIN) 0.1 % nasal spray 2 spray (not administered)  traMADol (ULTRAM) tablet 100 mg (not administered)  gabapentin (NEURONTIN) capsule 600 mg (600 mg Oral Given 10/11/16 1450)  gabapentin  (NEURONTIN) capsule 600 mg (not administered)  gabapentin (NEURONTIN) capsule 300 mg (300 mg Oral Given 10/11/16 0814)  gabapentin (NEURONTIN) capsule 300 mg (300 mg Oral Given 10/11/16 1450)  hydrALAZINE (APRESOLINE) injection 10 mg (10 mg Intravenous Given 10/11/16 0818)  acetaminophen (TYLENOL) tablet 650 mg (not administered)    Or  acetaminophen (TYLENOL) suppository 650 mg (not administered)  ondansetron (ZOFRAN) tablet 4 mg (not administered)    Or  ondansetron (ZOFRAN) injection 4 mg (not administered)  insulin aspart (novoLOG) injection 0-9 Units (3 Units Subcutaneous Given 10/11/16 1507)  furosemide (LASIX) injection 40 mg (40 mg Intravenous Refused 10/11/16 0910)  heparin injection 5,000 Units (5,000 Units Subcutaneous Given 10/11/16 1507)  polyethylene glycol (MIRALAX / GLYCOLAX) packet 17 g (17 g Oral Given 10/11/16 1642)  docusate sodium (COLACE) capsule 100 mg (100 mg Oral Given 10/11/16 1642)  furosemide (LASIX) injection 40 mg (40 mg Intravenous Given 10/11/16 0415)    Mobility walks with person assist

## 2016-10-11 NOTE — ED Notes (Signed)
Patient ambulated to hallway with 2 assist. Oxygen sats 96% when laying. Decreased to 92% on RA when walking to the hall. Patient c/o shortness of breath on the way back. Sats decreased to 87%. Patient assisted back into bed and instructed to take some nice deep breaths. Sats slowly increased to 92% RA. Will continue to monitor.

## 2016-10-11 NOTE — ED Provider Notes (Signed)
Patient presented to the ER with shortness of breath. Over the last 4 days she has noticed increased swelling of her legs as well as significantly diminished exercise tolerance. Minimal exertion causes her to get out of breath and have to stop and rest. No chest pain.  Face to face Exam: HEENT - PERRLA Lungs - faint crackles Heart - RRR, no M/R/G Abd - S/NT/ND Neuro - alert, oriented x3 Ext - 3+ pitting edema  Plan: Patient with increased fluid retention and worsening shortness of breath with minimal exertion over 4 days. She is supposed to be taking Lasix daily, but does not take it like she has prescribed because she is afraid to hurt her only kidney. Workup today does show decreased GFR with a creatinine of 2.13. She also appears to be volume overloaded. She will require diuresis with careful following of her kidney function. Will require hospitalization.   Orpah Greek, MD 10/11/16 [phone]

## 2016-10-11 NOTE — H&P (Signed)
History and Physical    Nicole Strong MVE:[id number] DOB: 1950/10/11 DOA: 10/11/2016  PCP: Ann Held, DO  Patient coming from: Home.  Chief Complaint: Shortness of breath.  HPI: Nicole Strong is a 66 y.o. 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 Patient states over the last 4-5 days patient has been getting short of breath on exertion. Denies any orthopnea. Denies any chest pain and productive cough fever or chills. Patient has noticed increasing edema over the lower extremity. Patient has not been taking her Lasix due to history of solitary kidney.   ED Course: In the ER patient was found to be adenomatous with lower extremity edema and chest x-ray shows congestion and pleural effusion. Patient was given Lasix 40 mg IV in the ER and admitted for CHF exacerbation.  Review of Systems: As per HPI, rest all negative.   Past Medical History:  Diagnosis Date  . Cancer ([place])   . 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 Diabetes mellitus    2  . 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 Hyperlipidemia   . Hypertension   . Renal disorder    L kidney removed  . Renal mass    BEING WORKED UP BY UROLOGY  . 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 CESAREAN SECTION    . CHOLECYSTECTOMY    . NEPHRECTOMY       reports that she has quit smoking. She has never used smokeless tobacco. She reports that she does not drink alcohol or use drugs.  Allergies  Allergen Reactions  . Biaxin [Clarithromycin] Other (See Comments)    "face turns red"  . Ciprofloxacin Other (See Comments)    Had Guillene-Barre sd.  . Codeine Nausea And Vomiting and Other (See Comments)    Hallucinations  . Erythromycin Nausea And Vomiting  . Influenza Vaccines     Had Guillene-Barre  sd.  Celesta Gentile [Sitagliptin]   . Prednisone     Mania.  . Augmentin [Amoxicillin-Pot Clavulanate] Diarrhea    Family History  Problem Relation Age of Onset  . Cancer Father        PROSTATE  . Healthy Mother   . Cancer Sister        BREAST    Prior to Admission medications   Medication Sig Start Date End Date Taking? Authorizing Provider  amitriptyline (ELAVIL) 10 MG tablet TAKE 4 TABLETS (40 MG TOTAL) BY MOUTH AT BEDTIME. 07/20/16  Yes Dohmeier, Asencion Partridge, MD  atorvastatin (LIPITOR) 10 MG tablet Take 1 tablet by mouth 3 times a week. 07/04/16  Yes Roma Schanz R, DO  azelastine (ASTELIN) 0.1 % nasal spray Place 2 sprays into both nostrils 2 (two) times daily. Use in each nostril as directed Patient taking differently: Place 2 sprays into both nostrils 2 (two) times daily. Use in each nostril as needed allergy 01/27/15  Yes Saguier, Iris Pert  Cholecalciferol (VITAMIN D3) 50000 units CAPS Take 50,000 Units by mouth once a week. 12/29/15  Yes Dohmeier, Asencion Partridge, MD  COD LIVER OIL PO Take 1 capsule by mouth every evening.    Yes [provider]  diclofenac (VOLTAREN) 50 MG EC tablet Take 50 mg by mouth 2 (two) times daily as needed for mild pain or moderate pain.  12/20/15  Yes [provider]  furosemide (LASIX) 40 MG tablet Take 40 mg by mouth as needed for fluid.    Yes [provider]  gabapentin (NEURONTIN) 300 MG capsule TAKE ONE CAPSULE BY MOUTH EVERY MORNING, 2 CAPSULES AT LUNCH AND 1 CAPSULE IN THE AFTERNOON AND 2 EVERY NIGHT AT BEDTIME 09/03/16  Yes Dohmeier, Asencion Partridge, MD  Insulin Glargine (LANTUS SOLOSTAR) 100 UNIT/ML Solostar Pen Inject 24 Units into the skin daily at 10 pm. Patient taking differently: Inject 22 Units into the skin daily at 10 pm.  12/19/15  Yes Philemon Kingdom, MD  insulin lispro (HUMALOG KWIKPEN) 100 UNIT/ML KiwkPen Inject 10-15 units three times daily with meals. 04/04/16  Yes Philemon Kingdom, MD  ipratropium (ATROVENT HFA) 17  MCG/ACT inhaler Inhale 2 puffs into the lungs every 6 (six) hours as needed for wheezing. 01/27/15  Yes Saguier, Percell Miller, PA-C  lisinopril (PRINIVIL,ZESTRIL) 20 MG tablet Take 20 mg by mouth at bedtime.    Yes [provider]  promethazine (PHENERGAN) 25 MG tablet Take 1 tablet (25 mg total) by mouth every 8 (eight) hours as needed for nausea or vomiting. 04/04/16  Yes Dohmeier, Asencion Partridge, MD  traMADol (ULTRAM) 50 MG tablet TAKE 1 EVERY MORNING AND 2 EVERY EVENING 04/24/16  Yes Dohmeier, Asencion Partridge, MD  albuterol (VENTOLIN HFA) 108 (90 BASE) MCG/ACT inhaler Inhale 2 puffs into the lungs every 6 (six) hours as needed for wheezing or shortness of breath. Patient not taking: Reported on 10/11/2016 08/18/14   Colon Branch, MD  BD PEN NEEDLE NANO U/F 32G X 4 MM MISC USE 1X A DAY 08/29/15   Philemon Kingdom, MD  Blood Glucose Monitoring Suppl (ONETOUCH VERIO IQ SYSTEM) w/Device KIT Used to check sugar 2 times daily. 02/23/16   Philemon Kingdom, MD  glucose blood (ONETOUCH VERIO) test strip Use as instructed to check sugar 2 times daily 02/23/16   Philemon Kingdom, MD  HUMALOG KWIKPEN 100 UNIT/ML KiwkPen INJECT 10-15 UNITS THREE TIMES A DAY WITH MEALS Patient not taking: Reported on 10/11/2016 04/09/16   Philemon Kingdom, MD  Lancets [facility] ULTRASOFT) lancets Use as instructed to check sugar 2 times daily. 02/23/16   Philemon Kingdom, MD  meclizine (ANTIVERT) 25 MG tablet TAKE ONE TABLET BY MOUTH TWICE A DAY Patient not taking: Reported on 10/11/2016 03/13/16   Ann Held, DO  oxyCODONE-acetaminophen (PERCOCET/ROXICET) 5-325 MG per tablet Take 1 tablet by mouth every 4 (four) hours as needed. Patient not taking: Reported on 10/11/2016 03/15/14   Ward, Delice Bison, DO    Physical Exam: Vitals:   10/11/16 0000 10/11/16 0156 10/11/16 0300 10/11/16 0330  BP: (!) 215/84 (!) 198/79 (!) 167/96 (!) 210/81  Pulse: 72 78 78 76  Resp: _0 Temp: 99 F (37.2 C)     TempSrc: Oral     SpO2: 93% 91%  95% 94%      Constitutional: Moderately built and nourished. Vitals:   10/11/16 0000 10/11/16 0156 10/11/16 0300 10/11/16 0330  BP: (!) 215/84 (!) 198/79 (!) 167/96 (!) 210/81  Pulse: 72 78 78 76  Resp: _1 Temp: 99 F (37.2 C)     TempSrc: Oral     SpO2: 93% 91% 95% 94%   Eyes: Anicteric. No pallor. ENMT: No discharge from the ears eyes nose or mouth. Neck: JVD elevated. No mass felt. Respiratory: No rhonchi or crepitations. Cardiovascular: S1 and S2 heard no murmurs appreciated. Abdomen: Mildly distended nontender bowel sounds present. Musculoskeletal:  Bilateral lower extremity edema present. Skin: No rash. Neurologic: Alert awake oriented to time place and person. Moves all extremities. Psychiatric: Appears normal. Normal affect.   Labs on Admission: I have personally reviewed following labs and imaging studies  CBC:  Recent Labs Lab 10/11/16 0148  WBC 6.5  NEUTROABS 4.4  HGB 11.3*  HCT 33.5*  MCV 88.9  PLT 035   Basic Metabolic Panel:  Recent Labs Lab 10/11/16 0148  NA 145  K 3.9  CL 108  CO2 30  GLUCOSE 169*  BUN 26*  CREATININE 2.13*  CALCIUM 8.7*   GFR: CrCl cannot be calculated (Unknown ideal weight.). Liver Function Tests: No results for input(s): AST, ALT, ALKPHOS, BILITOT, PROT, ALBUMIN in the last 168 hours. No results for input(s): LIPASE, AMYLASE in the last 168 hours. No results for input(s): AMMONIA in the last 168 hours. Coagulation Profile: No results for input(s): INR, PROTIME in the last 168 hours. Cardiac Enzymes: No results for input(s): CKTOTAL, CKMB, CKMBINDEX, TROPONINI in the last 168 hours. BNP (last 3 results) No results for input(s): PROBNP in the last 8760 hours. HbA1C: No results for input(s): HGBA1C in the last 72 hours. CBG: No results for input(s): GLUCAP in the last 168 hours. Lipid Profile: No results for input(s): CHOL, HDL, LDLCALC, TRIG, CHOLHDL, LDLDIRECT in the last 72 hours. Thyroid Function  Tests: No results for input(s): TSH, T4TOTAL, FREET4, T3FREE, THYROIDAB in the last 72 hours. Anemia Panel: No results for input(s): VITAMINB12, FOLATE, FERRITIN, TIBC, IRON, RETICCTPCT in the last 72 hours. Urine analysis:    Component Value Date/Time   COLORURINE YELLOW 01/20/2016 1721   APPEARANCEUR CLOUDY (A) 01/20/2016 1721   LABSPEC 1.016 01/20/2016 1721   PHURINE 6.0 01/20/2016 1721   GLUCOSEU >=500 (A) 01/20/2016 1721   HGBUR NEGATIVE 01/20/2016 1721   BILIRUBINUR NEGATIVE 01/20/2016 1721   BILIRUBINUR neg 12/31/2013 1458   KETONESUR NEGATIVE 01/20/2016 1721   PROTEINUR >=300 (A) 01/20/2016 1721   UROBILINOGEN 0.2 04/07/2014 0117   NITRITE NEGATIVE 01/20/2016 1721   LEUKOCYTESUR TRACE (A) 01/20/2016 1721   Sepsis Labs: _0 (procalcitonin:4,lacticidven:4) )No results found for this or any previous visit (from the past 240 hour(s)).   Radiological Exams on Admission: Dg Chest 2 View  Result Date: 10/10/2016 CLINICAL DATA:  Shortness of breath EXAM: CHEST  2 VIEW COMPARISON:  01/27/2015 FINDINGS: Low lung volumes. Tiny left greater than right pleural effusions. Hazy atelectasis or infiltrate at the left base. Borderline cardiomegaly with aortic atherosclerosis. No pneumothorax. IMPRESSION: 1. Small left greater than right pleural effusions. Hazy atelectasis or infiltrate at the left base 2. Borderline cardiomegaly Electronically Signed   By: Donavan Foil M.D.   On: 10/10/2016 18:00    EKG: Independently reviewed. Normal sinus rhythm.  Assessment/Plan Principal Problem:   Acute on chronic respiratory failure with hypoxia (HCC) Active Problems:   Essential hypertension   Uncontrolled type 2 diabetes mellitus with stage 2 chronic kidney disease, with long-term current use of insulin (HCC)   Axonal GBS (Guillain-Barre syndrome) (HCC)   CHF (congestive heart failure) ([place])    1. Acute congestive heart failure EF unknown - 2-D echo has been ordered and patient has  been placed on Lasix 40 mg IV every 12. Since patient has only solitary kidney and has renal failure closely follow metabolic panel. Not on ACE inhibitor or ARB due to renal failure. 2. Acute on chronic renal failure stage III with solitary kidney - patient's creatinine has significantly worsened from last year. Patient did  take some water history for abdominal discomfort. Will hold off lisinopril and discontinue diclofenac. Closely follow intake and output check UA and FENa. 3. Hypertension - since lisinopril is on hold secondary to renal failure will keep patient on when necessary IV hydralazine. Closely follow blood pressure trends. 4. Diabetes mellitus type 2 - I have decreased patient's home dose of insulin from 24 units to 15 minutes due to worsening creatinine. Sliding-scale coverage. 5. Anemia probably secondary to chronic kidney disease - follow CBC. 6. Abdominal discomfort with poor appetite - patient states over the last few months patient has been having abdominal discomfort with poor appetite. Will check CT abdomen. 7. History of GBS.   DVT prophylaxis: Heparin. Code Status: Full code.  Family Communication: Discussed with patient.  Disposition Plan: Home.  Consults called: None.  Admission status: Inpatient.    Rise Patience MD [facility] Pager [phone].  If 7PM-7AM, please contact night-coverage [email or web address] Password [email or web address]  10/11/2016, 4:18 AM

## 2016-10-11 NOTE — ED Provider Notes (Signed)
[facility] DEPT Provider Note   CSN: [id number] Arrival date & time: 10/10/16  1650     History   Chief Complaint Chief Complaint  Patient presents with  . Shortness of Breath  . Eye Problem    HPI Nicole Strong is a 66 y.o. female.  Patient presents to the emergency department with chief complaint of shortness of breath. She states that she has had some increasing shortness of breath over the past several days. She reports that she has some chest tightness sensation. She reports bilateral lower extremity swelling, which is not new for her. She does state that the swelling has worsened recently.she denies any fever, chills, cough. Denies any nausea, vomiting, diarrhea. There are no other associated symptoms. There are no modifying factors.   The history is provided by the patient. No language interpreter was used.  Eye Problem      Past Medical History:  Diagnosis Date  . Cancer ([place])   . 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 Diabetes mellitus    2  . 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 Hyperlipidemia   . Hypertension   . Renal disorder    L kidney removed  . Renal mass    BEING WORKED UP BY UROLOGY  . 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 Vitamin D deficiency 12/29/2015  . Pain in toe of left foot 06/12/2015  . Uncontrolled type 2 diabetes mellitus with stage 2 chronic kidney disease, with long-term current use of insulin ([place]) 04/21/2015  . Neuropathy due to secondary diabetes ([place]) 12/29/2014  . CIDP (chronic inflammatory demyelinating polyneuropathy) ([place]) 12/29/2014  . 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 Enteritis due to Clostridium difficile 08/15/2013  . ARF (acute renal failure) ([place]) 08/14/2013  . Acute renal failure ([place]) 08/14/2013  . Obesity (BMI 30-39.9) 11/19/2012  . Single  kidney   . Diarrhea 05/31/2012  . Dehydration 05/31/2012  . Weakness generalized 05/31/2012  . Hyponatremia 05/31/2012  . Gait disorder 05/19/2012  . 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 NEUROPATHY 01/26/2010  . BACK PAIN, LUMBAR, WITH RADICULOPATHY 01/26/2010  . NAUSEA WITH VOMITING 01/26/2010  . Hyperlipidemia 10/19/2009  . HYPERTENSION 10/19/2009  . CARDIAC MURMUR 10/19/2009  . POSTMENOPAUSAL STATUS 10/19/2009    Past Surgical History:  Procedure Laterality Date  . CESAREAN SECTION    . CHOLECYSTECTOMY    . NEPHRECTOMY      OB History    No data available       Home Medications    Prior to Admission medications   Medication Sig Start Date End Date Taking? Authorizing Provider  albuterol (VENTOLIN HFA) 108 (90 BASE) MCG/ACT inhaler Inhale 2 puffs into the lungs every 6 (six) hours as needed for wheezing or shortness of breath. 08/18/14   Colon Branch, MD  amitriptyline (ELAVIL) 10 MG tablet TAKE 4 TABLETS (40 MG TOTAL) BY MOUTH AT BEDTIME. 07/20/16   Dohmeier, Asencion Partridge, MD  atorvastatin (LIPITOR) 10 MG tablet Take 1 tablet by mouth 3 times a week. 07/04/16   Ann Held, DO  azelastine (ASTELIN) 0.1 % nasal spray Place 2 sprays into both nostrils 2 (two) times daily. Use in each nostril as directed Patient taking differently: Place 2 sprays into both nostrils 2 (two) times daily. Use in each nostril as  needed. 01/27/15   Saguier, Percell Miller, PA-C  BD PEN NEEDLE NANO U/F 32G X 4 MM MISC USE 1X A DAY 08/29/15   Philemon Kingdom, MD  Blood Glucose Monitoring Suppl (ONETOUCH VERIO IQ SYSTEM) w/Device KIT Used to check sugar 2 times daily. 02/23/16   Philemon Kingdom, MD  cephALEXin [facility]) 500 MG capsule  03/10/16   [provider]  Cholecalciferol (VITAMIN D3) 50000 units CAPS Take 50,000 Units by mouth once a week. 12/29/15   Dohmeier, Asencion Partridge, MD  COD LIVER OIL PO Take 1 capsule by mouth every evening.      [provider]  diclofenac (VOLTAREN) 50 MG EC tablet Take 50 mg by mouth 2 (two) times daily as needed for mild pain or moderate pain.  12/20/15   [provider]  furosemide (LASIX) 40 MG tablet Take 40 mg by mouth as needed.     [provider]  gabapentin (NEURONTIN) 300 MG capsule TAKE ONE CAPSULE BY MOUTH EVERY MORNING, 2 CAPSULES AT LUNCH AND 1 CAPSULE IN THE AFTERNOON AND 2 EVERY NIGHT AT BEDTIME 09/03/16   Dohmeier, Asencion Partridge, MD  glucose blood (ONETOUCH VERIO) test strip Use as instructed to check sugar 2 times daily 02/23/16   Philemon Kingdom, MD  HUMALOG KWIKPEN 100 UNIT/ML KiwkPen INJECT 10-15 UNITS THREE TIMES A DAY WITH MEALS 04/09/16   Philemon Kingdom, MD  Insulin Glargine (LANTUS SOLOSTAR) 100 UNIT/ML Solostar Pen Inject 24 Units into the skin daily at 10 pm. Patient taking differently: Inject 22 Units into the skin daily at 10 pm.  12/19/15   Philemon Kingdom, MD  insulin lispro (HUMALOG KWIKPEN) 100 UNIT/ML KiwkPen Inject 10-15 units three times daily with meals. 04/04/16   Philemon Kingdom, MD  ipratropium (ATROVENT HFA) 17 MCG/ACT inhaler Inhale 2 puffs into the lungs every 6 (six) hours as needed for wheezing. 01/27/15   Saguier, Percell Miller, PA-C  Lancets [facility] ULTRASOFT) lancets Use as instructed to check sugar 2 times daily. 02/23/16   Philemon Kingdom, MD  lisinopril (PRINIVIL,ZESTRIL) 20 MG tablet Take 20 mg by mouth at bedtime.     [provider]  meclizine (ANTIVERT) 25 MG tablet TAKE ONE TABLET BY MOUTH TWICE A DAY 03/13/16   Carollee Herter, Alferd Apa, DO  oxyCODONE-acetaminophen (PERCOCET/ROXICET) 5-325 MG per tablet Take 1 tablet by mouth every 4 (four) hours as needed. Patient taking differently: Take 1 tablet by mouth every 4 (four) hours as needed for moderate pain or severe pain.  03/15/14   Ward, Delice [place], DO  promethazine (PHENERGAN) 25 MG tablet Take 1 tablet (25 mg total) by mouth every 8 (eight) hours as needed for nausea or  vomiting. 04/04/16   Dohmeier, Asencion Partridge, MD  traMADol (ULTRAM) 50 MG tablet TAKE 1 EVERY MORNING AND 2 EVERY EVENING 04/24/16   Dohmeier, Asencion Partridge, MD    Family History Family History  Problem Relation Age of Onset  . Cancer Father        PROSTATE  . Healthy Mother   . Cancer Sister        BREAST    Social History Social History  Substance Use Topics  . Smoking status: Former Research scientist (life sciences)  . Smokeless tobacco: Never Used  . Alcohol use No     Allergies   Biaxin [clarithromycin]; Ciprofloxacin; Codeine; Erythromycin; Influenza vaccines; Januvia [sitagliptin]; Prednisone; and Augmentin [amoxicillin-pot clavulanate]   Review of Systems Review of Systems  All other systems reviewed and are negative.    Physical Exam Updated Vital Signs BP (!) 198/79 (  BP Location: Right Wrist)   Pulse 78   Temp 99 F (37.2 C) (Oral)   Resp 18   SpO2 91%   Physical Exam  Constitutional: She is oriented to person, place, and time. She appears well-developed and well-nourished.  HENT:  Head: Normocephalic and atraumatic.  Mild erythema beneath left eye, but doesn't appear like cellulitis, no abscess  Eyes: Pupils are equal, round, and reactive to light. Conjunctivae and EOM are normal.  Neck: Normal range of motion. Neck supple.  Cardiovascular: Normal rate and regular rhythm.  Exam reveals no gallop and no friction rub.   No murmur heard. Pulmonary/Chest: Effort normal and breath sounds normal. No respiratory distress. She has no wheezes. She has no rales. She exhibits no tenderness.  Abdominal: Soft. Bowel sounds are normal. She exhibits no distension and no mass. There is no tenderness. There is no rebound and no guarding.  Musculoskeletal: Normal range of motion. She exhibits edema. She exhibits no tenderness.  Bilateral lower extremity edema  Neurological: She is alert and oriented to person, place, and time.  Skin: Skin is warm and dry.  Psychiatric: She has a normal mood and affect. Her  behavior is normal. Judgment and thought content normal.  Nursing note and vitals reviewed.    ED Treatments / Results  Labs (all labs ordered are listed, but only abnormal results are displayed) Labs Reviewed  CBC WITH DIFFERENTIAL/PLATELET - Abnormal; Notable for the following:       Result Value   RBC 3.77 (*)    Hemoglobin 11.3 (*)    HCT 33.5 (*)    All other components within normal limits  BASIC METABOLIC PANEL - Abnormal; Notable for the following:    Glucose, Bld 169 (*)    BUN 26 (*)    Creatinine, Ser 2.13 (*)    Calcium 8.7 (*)    GFR calc non Af Amer 23 (*)    GFR calc Af Amer 26 (*)    All other components within normal limits  BRAIN NATRIURETIC PEPTIDE  I-STAT TROPONIN, ED  POCT I-STAT TROPONIN I    EKG  EKG Interpretation None       Radiology Dg Chest 2 View  Result Date: 10/10/2016 CLINICAL DATA:  Shortness of breath EXAM: CHEST  2 VIEW COMPARISON:  01/27/2015 FINDINGS: Low lung volumes. Tiny left greater than right pleural effusions. Hazy atelectasis or infiltrate at the left base. Borderline cardiomegaly with aortic atherosclerosis. No pneumothorax. IMPRESSION: 1. Small left greater than right pleural effusions. Hazy atelectasis or infiltrate at the left base 2. Borderline cardiomegaly Electronically Signed   By: Donavan Foil M.D.   On: 10/10/2016 18:00    Procedures Procedures (including critical care time)  Medications Ordered in ED Medications - No data to display   Initial Impression / Assessment and Plan / ED Course  I have reviewed the triage vital signs and the nursing notes.  Pertinent labs & imaging results that were available during my care of the patient were reviewed by me and considered in my medical decision making (see chart for details).     Patient with shortness of breath. No fever or cough. Chest x-ray shows left greater than right pleural effusions. She also has bilateral lower extremity edema. Will check BNP.  Patient  seen by and discussed with Dr. Betsey Holiday, who recommends admission. Patient has not been compliant with taking her Lasix due to her concern about injuring her kidney.  Today decreased GFR and creatinine is up at  2.13.  Clinically seems volume overloaded. O2 sat drops to 87% and becomes extremely winded while trying to walk to the door.  Will consult hospitalist for admission.    Appreciate Dr. Hal Hope for admitting the patient.  Final Clinical Impressions(s) / ED Diagnoses   Final diagnoses:  Shortness of breath    New Prescriptions New Prescriptions   No medications on file     Montine Circle, Hershal Coria 10/11/16 [id number]    Orpah Greek, MD 10/11/16 [phone]

## 2016-10-11 NOTE — ED Notes (Signed)
Patient denies pain and is resting comfortably.  

## 2016-10-11 NOTE — Progress Notes (Signed)
  Echocardiogram 2D Echocardiogram has been performed.  Jaevon Paras L Androw 10/11/2016, 3:42 PM

## 2016-10-11 NOTE — Progress Notes (Signed)
PROGRESS NOTE    Nicole Strong  WER:[id number] DOB: 05/26/1950 DOA: 10/11/2016 PCP: Ann Held, DO    Brief Narrative:  66 y.o. 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 Patient states over the last 4-5 days patient has been getting short of breath on exertion. Denies any orthopnea. Denies any chest pain and productive cough fever or chills. Patient has noticed increasing edema over the lower extremity. Patient has not been taking her Lasix due to history of solitary kidney.  Assessment & Plan:   Principal Problem:   Acute on chronic respiratory failure with hypoxia (HCC) Active Problems:   Essential hypertension   Uncontrolled type 2 diabetes mellitus with stage 2 chronic kidney disease, with long-term current use of insulin (HCC)   Axonal GBS (Guillain-Barre syndrome) (HCC)   CHF (congestive heart failure) ([place])   1. Acute congestive heart failure EF unknown, chronicity unknown 1. 2-D echo performed, pending results 2. Continue scheduled IV lasix BID 3. Repeat bmet in AM 2. Acute on chronic renal failure stage III with solitary kidney 1. Tolerating lasix 2. Mildly improved overnight 3. Continue lasix as tolerated 4. Repeat bmet in AM 5. Cont to hold nephrotoxic agents 3. Hypertension 1. Cont on PRN hydralazine 2. ACEI on hold secondary to renal insufficiency above 4. Diabetes mellitus type 2 1. Long-acting insulin decreased from 24 units to 15 minutes due to worsening creatinine. 2. Cont SSI coverage 5. Anemia probably secondary to chronic kidney disease 1. Hemodynamically stable 2. Cont to follow CBC. 6. Abdominal discomfort with poor appetite 1. CT abd reviewed 2. Patient with findings of stool, most notably in the transverse colon, corresponding to location of patient's abd discomfort 7. History of  GBS. 1. Stable at this time  DVT prophylaxis: Heparin subQ Code Status: Full Family Communication: Pt in room, family not at bedside Disposition Plan: Uncertain at this time  Consultants:     Procedures:     Antimicrobials: Anti-infectives    None       Subjective: Still complaining of LE swelling  Objective: Vitals:   10/11/16 1700 10/11/16 1701 10/11/16 1730 10/11/16 1800  BP: (!) 173/48  (!) 163/73 (!) 126/53  Pulse:  76 71 71  Resp:  (!) 22 (!) 23 20  Temp:      TempSrc:      SpO2:  100% 99% 99%  Weight:      Height:       No intake or output data in the 24 hours ending 10/11/16 1806 Filed Weights   10/11/16 0439  Weight: 97.5 kg (215 lb)    Examination:  General exam: Appears calm and comfortable  Respiratory system: Clear to auscultation. Respiratory effort normal. Cardiovascular system: S1 & S2 heard, RRR.  Gastrointestinal system: Abdomen is nondistended, soft and nontender. No organomegaly or masses felt. Normal bowel sounds heard. Central nervous system: Alert and oriented. No focal neurological deficits. Extremities: Symmetric 5 x 5 power. Skin: No rashes, lesions Psychiatry: Judgement and insight appear normal. Mood & affect appropriate.   Data Reviewed: I have personally reviewed following labs and imaging studies  CBC:  Recent Labs Lab 10/11/16 0148 10/11/16 0431  WBC 6.5 8.2  NEUTROABS 4.4  --   HGB 11.3* 13.1  HCT 33.5* 39.2  MCV 88.9 89.1  PLT 211 195   Basic Metabolic Panel:  Recent Labs Lab 10/11/16 0148 10/11/16  0431  NA 145  --   K 3.9  --   CL 108  --   CO2 30  --   GLUCOSE 169*  --   BUN 26*  --   CREATININE 2.13* 2.05*  CALCIUM 8.7*  --   MG  --  2.0   GFR: Estimated Creatinine Clearance: 28.1 mL/min (A) (by C-G formula based on SCr of 2.05 mg/dL (H)). Liver Function Tests: No results for input(s): AST, ALT, ALKPHOS, BILITOT, PROT, ALBUMIN in the last 168 hours. No results for input(s): LIPASE, AMYLASE  in the last 168 hours. No results for input(s): AMMONIA in the last 168 hours. Coagulation Profile: No results for input(s): INR, PROTIME in the last 168 hours. Cardiac Enzymes:  Recent Labs Lab 10/11/16 0431 10/11/16 1014 10/11/16 1628  TROPONINI <0.03 <0.03 <0.03   BNP (last 3 results) No results for input(s): PROBNP in the last 8760 hours. HbA1C: No results for input(s): HGBA1C in the last 72 hours. CBG:  Recent Labs Lab 10/11/16 0743 10/11/16 1449  GLUCAP 180* 203*   Lipid Profile: No results for input(s): CHOL, HDL, LDLCALC, TRIG, CHOLHDL, LDLDIRECT in the last 72 hours. Thyroid Function Tests:  Recent Labs  10/11/16 0431  TSH 3.631   Anemia Panel: No results for input(s): VITAMINB12, FOLATE, FERRITIN, TIBC, IRON, RETICCTPCT in the last 72 hours. Sepsis Labs: No results for input(s): PROCALCITON, LATICACIDVEN in the last 168 hours.  No results found for this or any previous visit (from the past 240 hour(s)).   Radiology Studies: Ct Abdomen Pelvis Wo Contrast  Result Date: 10/11/2016 CLINICAL DATA:  Flank pain, question stones EXAM: CT ABDOMEN AND PELVIS WITHOUT CONTRAST TECHNIQUE: Multidetector CT imaging of the abdomen and pelvis was performed following the standard protocol without IV contrast. COMPARISON:  CT 12/11/2013 FINDINGS: Lower chest: Small bilateral pleural effusions. Heart is normal size. Bibasilar atelectasis. Mitral valve annular calcifications noted. Hepatobiliary: No focal liver abnormality is seen. Status post cholecystectomy. No biliary dilatation. Pancreas: No focal abnormality or ductal dilatation. Spleen: No focal abnormality.  Normal size. Adrenals/Urinary Tract: Prior left nephrectomy. No renal or ureteral stones on the right. No hydronephrosis. 16 mm low-density nodule in the right adrenal gland compatible with adenoma. Small adenoma also in the left adrenal gland. Urinary bladder unremarkable. Stomach/Bowel: Normal appendix. Stomach, large  and small bowel grossly unremarkable. Vascular/Lymphatic: No evidence of aneurysm or adenopathy. Aortic and iliac calcifications. Reproductive: Uterus and adnexa unremarkable.  No mass. Other: No free fluid or free air. Musculoskeletal: No acute bony abnormality. IMPRESSION: Prior left nephrectomy.  No right ureteral stones or hydronephrosis. Small bilateral adrenal adenomas. Small bilateral pleural effusions.  Bibasilar atelectasis. Electronically Signed   By: Rolm Baptise M.D.   On: 10/11/2016 10:08   Dg Chest 2 View  Result Date: 10/10/2016 CLINICAL DATA:  Shortness of breath EXAM: CHEST  2 VIEW COMPARISON:  01/27/2015 FINDINGS: Low lung volumes. Tiny left greater than right pleural effusions. Hazy atelectasis or infiltrate at the left base. Borderline cardiomegaly with aortic atherosclerosis. No pneumothorax. IMPRESSION: 1. Small left greater than right pleural effusions. Hazy atelectasis or infiltrate at the left base 2. Borderline cardiomegaly Electronically Signed   By: Donavan Foil M.D.   On: 10/10/2016 18:00    Scheduled Meds: . amitriptyline  40 mg Oral QHS  . [START ON 10/12/2016] atorvastatin  10 mg Oral Once per day on Mon Wed Fri  . docusate sodium  100 mg Oral Daily  . furosemide  40 mg Intravenous Q12H  .  gabapentin  300 mg Oral Q breakfast  . gabapentin  300 mg Oral QPC lunch  . gabapentin  600 mg Oral Q lunch  . gabapentin  600 mg Oral QHS  . heparin  5,000 Units Subcutaneous Q8H  . insulin aspart  0-9 Units Subcutaneous TID WC  . insulin glargine  15 Units Subcutaneous Q2200  . polyethylene glycol  17 g Oral Daily  . traMADol  100 mg Oral QPM  . traMADol  50 mg Oral Daily   Continuous Infusions:   LOS: 0 days   CHIU, Orpah Melter, MD [facility] Pager [phone]  If 7PM-7AM, please contact night-coverage [email or web address] Password [email or web address] 10/11/2016, 6:06 PM

## 2016-10-12 DIAGNOSIS — G610 Guillain-Barre syndrome: Secondary | ICD-10-CM

## 2016-10-12 LAB — BASIC METABOLIC PANEL
ANION GAP: 6 (ref 5–15)
BUN: 25 mg/dL — ABNORMAL HIGH (ref 6–20)
CO2: 33 mmol/L — ABNORMAL HIGH (ref 22–32)
Calcium: 8.6 mg/dL — ABNORMAL LOW (ref 8.9–10.3)
Chloride: 105 mmol/L (ref 101–111)
Creatinine, Ser: 2.17 mg/dL — ABNORMAL HIGH (ref 0.44–1.00)
GFR calc Af Amer: 26 mL/min — ABNORMAL LOW (ref >60)
GFR, EST NON AFRICAN AMERICAN: 22 mL/min — AB (ref >60)
Glucose, Bld: 128 mg/dL — ABNORMAL HIGH (ref 65–99)
Potassium: 3.9 mmol/L (ref 3.5–5.1)
Sodium: 144 mmol/L (ref 135–145)

## 2016-10-12 LAB — ECHOCARDIOGRAM COMPLETE
Height: 61 in
Weight: 3440 oz

## 2016-10-12 LAB — GLUCOSE, CAPILLARY
GLUCOSE-CAPILLARY: 104 mg/dL — AB (ref 65–99)
GLUCOSE-CAPILLARY: 166 mg/dL — AB (ref 65–99)
GLUCOSE-CAPILLARY: 221 mg/dL — AB (ref 65–99)
Glucose-Capillary: 204 mg/dL — ABNORMAL HIGH (ref 65–99)

## 2016-10-12 NOTE — Progress Notes (Signed)
PROGRESS NOTE    Nicole Strong  NWG:[id number] DOB: 01-Oct-1950 DOA: 10/11/2016 PCP: Ann Held, DO    Brief Narrative:  66 y.o. 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 Patient states over the last 4-5 days patient has been getting short of breath on exertion. Denies any orthopnea. Denies any chest pain and productive cough fever or chills. Patient has noticed increasing edema over the lower extremity. Patient has not been taking her Lasix due to history of solitary kidney.  Assessment & Plan:   Principal Problem:   Acute on chronic respiratory failure with hypoxia (HCC) Active Problems:   Essential hypertension   Uncontrolled type 2 diabetes mellitus with stage 2 chronic kidney disease, with long-term current use of insulin (HCC)   Axonal GBS (Guillain-Barre syndrome) (HCC)   CHF (congestive heart failure) ([place])   1. Acute diastolic congestive heart failure, chronicity unknown 1. 2-D echo performed, normal LVEF, grade 2 diastolic dysfunction 2. Continue scheduled IV lasix BID 3. Will recheck bmet in AM 2. Acute on chronic renal failure stage III with solitary kidney 1. Tolerating lasix 2. Repeat lytes per above 3. Repeat bmet in AM 4. Continue to hold nephrotoxic agents 3. Hypertension 1. Patient continued on PRN hydralazine 2. ACEI remains on hold secondary to renal insufficiency above 4. Diabetes mellitus type 2 1. Long-acting insulin decreased from 24 units to 15 minutes due to worsening creatinine. 2. Cont SSI coverage 3. Glucose remains stable thus far 5. Anemia probably secondary to chronic kidney disease 1. Hemodynamically stable at this time 6. Abdominal discomfort with poor appetite 1. CT abd reviewed 2. Patient with findings of stool, most notably in the transverse colon, corresponding to location of  patient's abd discomfort 3. Continue cathartics as needd 7. History of GBS. 1. Stable at this time  DVT prophylaxis: Heparin subQ Code Status: Full Family Communication: Pt in room, family not at bedside Disposition Plan: Uncertain at this time  Consultants:     Procedures:     Antimicrobials: Anti-infectives    None      Subjective: Patient reports feeling better today  Objective: Vitals:   10/11/16 1848 10/11/16 2130 10/12/16 0715 10/12/16 1527  BP: (!) 152/43 (!) 113/48 (!) 149/63 (!) 153/54  Pulse: 74 64 72 73  Resp: 20 20 18 18   Temp: 98.2 F (36.8 C) 98.2 F (36.8 C) 99.1 F (37.3 C) 98.6 F (37 C)  TempSrc: Oral Oral Oral Oral  SpO2: 97% 97% 100% 95%  Weight:      Height:        Intake/Output Summary (Last 24 hours) at 10/12/16 1655 Last data filed at 10/12/16 1546  Gross per 24 hour  Intake              890 ml  Output              600 ml  Net              290 ml   Filed Weights   10/11/16 0439  Weight: 97.5 kg (215 lb)    Examination: General exam: Awake, laying in bed, in nad Respiratory system: Normal respiratory effort, no wheezing Cardiovascular system: regular rate, s1, s2 Gastrointestinal system: Soft, nondistended, positive BS Central nervous system: CN2-12 grossly intact, strength intact Extremities: Perfused, no clubbing Skin: Normal skin turgor, no notable skin lesions seen Psychiatry: Mood  normal // no visual hallucinations    Data Reviewed: I have personally reviewed following labs and imaging studies  CBC:  Recent Labs Lab 10/11/16 0148 10/11/16 0431  WBC 6.5 8.2  NEUTROABS 4.4  --   HGB 11.3* 13.1  HCT 33.5* 39.2  MCV 88.9 89.1  PLT 211 681   Basic Metabolic Panel:  Recent Labs Lab 10/11/16 0148 10/11/16 0431 10/12/16 0431  NA 145  --  144  K 3.9  --  3.9  CL 108  --  105  CO2 30  --  33*  GLUCOSE 169*  --  128*  BUN 26*  --  25*  CREATININE 2.13* 2.05* 2.17*  CALCIUM 8.7*  --  8.6*  MG  --  2.0  --     GFR: Estimated Creatinine Clearance: 26.5 mL/min (A) (by C-G formula based on SCr of 2.17 mg/dL (H)). Liver Function Tests: No results for input(s): AST, ALT, ALKPHOS, BILITOT, PROT, ALBUMIN in the last 168 hours. No results for input(s): LIPASE, AMYLASE in the last 168 hours. No results for input(s): AMMONIA in the last 168 hours. Coagulation Profile: No results for input(s): INR, PROTIME in the last 168 hours. Cardiac Enzymes:  Recent Labs Lab 10/11/16 0431 10/11/16 1014 10/11/16 1628  TROPONINI <0.03 <0.03 <0.03   BNP (last 3 results) No results for input(s): PROBNP in the last 8760 hours. HbA1C: No results for input(s): HGBA1C in the last 72 hours. CBG:  Recent Labs Lab 10/11/16 1449 10/11/16 1841 10/11/16 2207 10/12/16 0726 10/12/16 1238  GLUCAP 203* 188* 201* 104* 204*   Lipid Profile: No results for input(s): CHOL, HDL, LDLCALC, TRIG, CHOLHDL, LDLDIRECT in the last 72 hours. Thyroid Function Tests:  Recent Labs  10/11/16 0431  TSH 3.631   Anemia Panel: No results for input(s): VITAMINB12, FOLATE, FERRITIN, TIBC, IRON, RETICCTPCT in the last 72 hours. Sepsis Labs: No results for input(s): PROCALCITON, LATICACIDVEN in the last 168 hours.  No results found for this or any previous visit (from the past 240 hour(s)).   Radiology Studies: Ct Abdomen Pelvis Wo Contrast  Result Date: 10/11/2016 CLINICAL DATA:  Flank pain, question stones EXAM: CT ABDOMEN AND PELVIS WITHOUT CONTRAST TECHNIQUE: Multidetector CT imaging of the abdomen and pelvis was performed following the standard protocol without IV contrast. COMPARISON:  CT 12/11/2013 FINDINGS: Lower chest: Small bilateral pleural effusions. Heart is normal size. Bibasilar atelectasis. Mitral valve annular calcifications noted. Hepatobiliary: No focal liver abnormality is seen. Status post cholecystectomy. No biliary dilatation. Pancreas: No focal abnormality or ductal dilatation. Spleen: No focal abnormality.   Normal size. Adrenals/Urinary Tract: Prior left nephrectomy. No renal or ureteral stones on the right. No hydronephrosis. 16 mm low-density nodule in the right adrenal gland compatible with adenoma. Small adenoma also in the left adrenal gland. Urinary bladder unremarkable. Stomach/Bowel: Normal appendix. Stomach, large and small bowel grossly unremarkable. Vascular/Lymphatic: No evidence of aneurysm or adenopathy. Aortic and iliac calcifications. Reproductive: Uterus and adnexa unremarkable.  No mass. Other: No free fluid or free air. Musculoskeletal: No acute bony abnormality. IMPRESSION: Prior left nephrectomy.  No right ureteral stones or hydronephrosis. Small bilateral adrenal adenomas. Small bilateral pleural effusions.  Bibasilar atelectasis. Electronically Signed   By: Rolm Baptise M.D.   On: 10/11/2016 10:08   Dg Chest 2 View  Result Date: 10/10/2016 CLINICAL DATA:  Shortness of breath EXAM: CHEST  2 VIEW COMPARISON:  01/27/2015 FINDINGS: Low lung volumes. Tiny left greater than right pleural effusions. Hazy atelectasis or infiltrate at the  left base. Borderline cardiomegaly with aortic atherosclerosis. No pneumothorax. IMPRESSION: 1. Small left greater than right pleural effusions. Hazy atelectasis or infiltrate at the left base 2. Borderline cardiomegaly Electronically Signed   By: Donavan Foil M.D.   On: 10/10/2016 18:00    Scheduled Meds: . amitriptyline  40 mg Oral QHS  . atorvastatin  10 mg Oral Once per day on Mon Wed Fri  . docusate sodium  100 mg Oral Daily  . furosemide  40 mg Intravenous Q12H  . gabapentin  300 mg Oral Q breakfast  . gabapentin  300 mg Oral QPC lunch  . gabapentin  600 mg Oral Q lunch  . gabapentin  600 mg Oral QHS  . heparin  5,000 Units Subcutaneous Q8H  . insulin aspart  0-9 Units Subcutaneous TID WC  . insulin glargine  15 Units Subcutaneous Q2200  . polyethylene glycol  17 g Oral Daily  . traMADol  100 mg Oral QPM  . traMADol  50 mg Oral Daily    Continuous Infusions:   LOS: 1 day   Clerance Umland, Orpah Melter, MD [facility] Pager [phone]  If 7PM-7AM, please contact night-coverage [email or web address] Password [email or web address] 10/12/2016, 4:55 PM

## 2016-10-12 NOTE — Progress Notes (Signed)
Pt had no preference to [facility] agency.  Referral given to [place] in house rep for HHRN/CHF teaching.

## 2016-10-13 ENCOUNTER — Inpatient Hospital Stay (HOSPITAL_COMMUNITY): Payer: Medicare Other

## 2016-10-13 LAB — BASIC METABOLIC PANEL
ANION GAP: 6 (ref 5–15)
BUN: 26 mg/dL — ABNORMAL HIGH (ref 6–20)
CHLORIDE: 103 mmol/L (ref 101–111)
CO2: 32 mmol/L (ref 22–32)
Calcium: 8.3 mg/dL — ABNORMAL LOW (ref 8.9–10.3)
Creatinine, Ser: 2.42 mg/dL — ABNORMAL HIGH (ref 0.44–1.00)
GFR calc non Af Amer: 19 mL/min — ABNORMAL LOW (ref >60)
GFR, EST AFRICAN AMERICAN: 23 mL/min — AB (ref >60)
Glucose, Bld: 115 mg/dL — ABNORMAL HIGH (ref 65–99)
POTASSIUM: 3.8 mmol/L (ref 3.5–5.1)
SODIUM: 141 mmol/L (ref 135–145)

## 2016-10-13 LAB — URINALYSIS, ROUTINE W REFLEX MICROSCOPIC
BACTERIA UA: NONE SEEN
Bilirubin Urine: NEGATIVE
HGB URINE DIPSTICK: NEGATIVE
Ketones, ur: NEGATIVE mg/dL
LEUKOCYTES UA: NEGATIVE
Nitrite: NEGATIVE
Specific Gravity, Urine: 1.013 (ref 1.005–1.030)
pH: 7 (ref 5.0–8.0)

## 2016-10-13 LAB — GLUCOSE, CAPILLARY
GLUCOSE-CAPILLARY: 191 mg/dL — AB (ref 65–99)
Glucose-Capillary: 98 mg/dL (ref 65–99)
Glucose-Capillary: 243 mg/dL — ABNORMAL HIGH (ref 65–99)
Glucose-Capillary: 194 mg/dL — ABNORMAL HIGH (ref 65–99)

## 2016-10-13 MED ORDER — GABAPENTIN 300 MG PO CAPS
300.0000 mg | ORAL_CAPSULE | Freq: Every day | ORAL | Status: DC
  Administered 2016-10-13 – 2016-10-17 (×5): 300 mg via ORAL
  Filled 2016-10-13 (×5): qty 1

## 2016-10-13 MED ORDER — FUROSEMIDE 10 MG/ML IJ SOLN
80.0000 mg | Freq: Three times a day (TID) | INTRAMUSCULAR | Status: DC
  Administered 2016-10-13 – 2016-10-15 (×4): 80 mg via INTRAVENOUS
  Filled 2016-10-13 (×4): qty 8

## 2016-10-13 MED ORDER — BACITRACIN-POLYMYXIN B 500-10000 UNIT/GM OP OINT
TOPICAL_OINTMENT | OPHTHALMIC | Status: DC
  Administered 2016-10-13 (×2): via OPHTHALMIC
  Administered 2016-10-13: 1 via OPHTHALMIC
  Administered 2016-10-13 – 2016-10-15 (×8): via OPHTHALMIC
  Filled 2016-10-13: qty 3.5

## 2016-10-13 MED ORDER — HYDRALAZINE HCL 25 MG PO TABS
25.0000 mg | ORAL_TABLET | Freq: Three times a day (TID) | ORAL | Status: DC
  Administered 2016-10-13 – 2016-10-18 (×13): 25 mg via ORAL
  Filled 2016-10-13 (×14): qty 1

## 2016-10-13 NOTE — Progress Notes (Signed)
PROGRESS NOTE    Nicole Strong  [id number]:[id number] DOB: Oct 12, 1950 DOA: 10/11/2016 PCP: Ann Held, DO    Brief Narrative:  66 y.o. 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 Patient states over the last 4-5 days patient has been getting short of breath on exertion. Denies any orthopnea. Denies any chest pain and productive cough fever or chills. Patient has noticed increasing edema over the lower extremity. Patient has not been taking her Lasix due to history of solitary kidney.  Assessment & Plan:   Principal Problem:   Acute on chronic respiratory failure with hypoxia (HCC) Active Problems:   Essential hypertension   Uncontrolled type 2 diabetes mellitus with stage 2 chronic kidney disease, with long-term current use of insulin (HCC)   Axonal GBS (Guillain-Barre syndrome) (HCC)   CHF (congestive heart failure) ([place])   1. Acute diastolic congestive heart failure, chronicity unknown 1. 2-D echo performed, normal LVEF, grade 2 diastolic dysfunction 2. Pt had been continued on BID IV lasix with some improvement in LE edema 3. Cr has risen to 2.4. Will hold lasix for now 4. Pt to continue 1500cc fluid restricted diet 2. Acute on chronic renal failure stage III with solitary kidney 1. Had earlier tolerated lasix 2. Continue to hold nephrotoxic agents 3. Pt followed by Dr. Florene Glen prior to admission. Given rising Cr, will consult Nephrology for further assistance 3. Hypertension 1. Patient continued on PRN hydralazine 2. ACEI presently on hold secondary to renal insufficiency above 3. Stable at present 4. Diabetes mellitus type 2 1. Long-acting insulin decreased from 24 units to 15 minutes due to worsening creatinine. 2. Cont SSI coverage 3. Glucose remains stable at this time 5. Anemia probably secondary to chronic kidney  disease 1. Currently remains hemodynamically stable 6. Abdominal discomfort with poor appetite 1. CT abd reviewed 2. Patient with findings of stool, most notably in the transverse colon, corresponding to location of patient's abd discomfort 3. Will continue with cathartics as needed 7. History of GBS. 1. Remains stable currently  DVT prophylaxis: Heparin subQ Code Status: Full Family Communication: Pt in room, family not at bedside Disposition Plan: Uncertain at this time  Consultants:   Nephrology  Procedures:     Antimicrobials: Anti-infectives    None      Subjective: Overnight reported increased sob with O2 sats into the 80's. Pt currently with O2 via [place].  Objective: Vitals:   10/12/16 1527 10/12/16 2120 10/13/16 0556 10/13/16 1338  BP: (!) 153/54 (!) 140/42 121/69 (!) 184/68  Pulse: 73 71 73 76  Resp: 18 20 20 18   Temp: 98.6 F (37 C) 98.7 F (37.1 C) 97.8 F (36.6 C) 98.5 F (36.9 C)  TempSrc: Oral Oral Oral Oral  SpO2: 95% 96% 99% 90%  Weight:   105.3 kg (232 lb 2.3 oz)   Height:        Intake/Output Summary (Last 24 hours) at 10/13/16 1414 Last data filed at 10/13/16 1338  Gross per 24 hour  Intake              960 ml  Output             2180 ml  Net            -1220 ml   Filed Weights   10/11/16 0439 10/13/16 0556  Weight: 97.5 kg (215 lb) 105.3 kg (232  lb 2.3 oz)    Examination: General exam: Conversant, in no acute distress Respiratory system: normal chest rise, clear, no audible wheezing Cardiovascular system: regular rhythm, s1-s2 Gastrointestinal system: Nondistended, nontender, pos BS Central nervous system: No seizures, no tremors Extremities: No cyanosis, no joint deformities Skin: No rashes, no pallor Psychiatry: Affect normal // no auditory hallucinations    Data Reviewed: I have personally reviewed following labs and imaging studies  CBC:  Recent Labs Lab 10/11/16 0148 10/11/16 0431  WBC 6.5 8.2  NEUTROABS 4.4  --    HGB 11.3* 13.1  HCT 33.5* 39.2  MCV 88.9 89.1  PLT 211 595   Basic Metabolic Panel:  Recent Labs Lab 10/11/16 0148 10/11/16 0431 10/12/16 0431 10/13/16 0515  NA 145  --  144 141  K 3.9  --  3.9 3.8  CL 108  --  105 103  CO2 30  --  33* 32  GLUCOSE 169*  --  128* 115*  BUN 26*  --  25* 26*  CREATININE 2.13* 2.05* 2.17* 2.42*  CALCIUM 8.7*  --  8.6* 8.3*  MG  --  2.0  --   --    GFR: Estimated Creatinine Clearance: 24.9 mL/min (A) (by C-G formula based on SCr of 2.42 mg/dL (H)). Liver Function Tests: No results for input(s): AST, ALT, ALKPHOS, BILITOT, PROT, ALBUMIN in the last 168 hours. No results for input(s): LIPASE, AMYLASE in the last 168 hours. No results for input(s): AMMONIA in the last 168 hours. Coagulation Profile: No results for input(s): INR, PROTIME in the last 168 hours. Cardiac Enzymes:  Recent Labs Lab 10/11/16 0431 10/11/16 1014 10/11/16 1628  TROPONINI <0.03 <0.03 <0.03   BNP (last 3 results) No results for input(s): PROBNP in the last 8760 hours. HbA1C: No results for input(s): HGBA1C in the last 72 hours. CBG:  Recent Labs Lab 10/12/16 1238 10/12/16 1716 10/12/16 2125 10/13/16 0734 10/13/16 1206  GLUCAP 204* 166* 221* 98 191*   Lipid Profile: No results for input(s): CHOL, HDL, LDLCALC, TRIG, CHOLHDL, LDLDIRECT in the last 72 hours. Thyroid Function Tests:  Recent Labs  10/11/16 0431  TSH 3.631   Anemia Panel: No results for input(s): VITAMINB12, FOLATE, FERRITIN, TIBC, IRON, RETICCTPCT in the last 72 hours. Sepsis Labs: No results for input(s): PROCALCITON, LATICACIDVEN in the last 168 hours.  No results found for this or any previous visit (from the past 240 hour(s)).   Radiology Studies: No results found.  Scheduled Meds: . amitriptyline  40 mg Oral QHS  . atorvastatin  10 mg Oral Once per day on Mon Wed Fri  . bacitracin-polymyxin b   Both Eyes Q4H  . docusate sodium  100 mg Oral Daily  . gabapentin  300 mg Oral  Q breakfast  . gabapentin  300 mg Oral QAC supper  . gabapentin  600 mg Oral Q lunch  . gabapentin  600 mg Oral QHS  . heparin  5,000 Units Subcutaneous Q8H  . insulin aspart  0-9 Units Subcutaneous TID WC  . insulin glargine  15 Units Subcutaneous Q2200  . polyethylene glycol  17 g Oral Daily  . traMADol  100 mg Oral QPM  . traMADol  50 mg Oral Daily   Continuous Infusions:   LOS: 2 days   Nicole Strong, Orpah Melter, MD [facility] Pager [phone]  If 7PM-7AM, please contact night-coverage [email or web address] Password [email or web address] 10/13/2016, 2:14 PM

## 2016-10-13 NOTE — Plan of Care (Signed)
Problem: Safety: Goal: Ability to remain free from injury will improve Outcome: Progressing .  Problem: Health Behavior/Discharge Planning: Goal: Ability to manage health-related needs will improve Outcome: Progressing .  Problem: Pain Managment: Goal: General experience of comfort will improve Outcome: Completed/Met Date Met: 10/13/16 Denies pain  Problem: Physical Regulation: Goal: Ability to maintain clinical measurements within normal limits will improve Outcome: Progressing . Goal: Will remain free from infection Outcome: Progressing .  Problem: Skin Integrity: Goal: Risk for impaired skin integrity will decrease Outcome: Progressing .  Problem: Tissue Perfusion: Goal: Risk factors for ineffective tissue perfusion will decrease Outcome: Progressing .  Problem: Activity: Goal: Risk for activity intolerance will decrease Outcome: Progressing .  Problem: Fluid Volume: Goal: Ability to maintain a balanced intake and output will improve Outcome: Progressing .  Problem: Bowel/Gastric: Goal: Will not experience complications related to bowel motility Outcome: Completed/Met Date Met: 10/13/16 .

## 2016-10-13 NOTE — Consult Note (Signed)
Renal Service Consult Note [facility]  Nicole Strong 10/13/2016 Sol Blazing Requesting Physician:  Dr Wyline Copas  Reason for Consult:  Renal failure, anasarca HPI: The patient is a 66 y.o. year-old w/ hx of GBS, anxiety/ depression, HTN, DM2, DJD, RCC sp nephrectomy who presented to ED on 8/29 with SOB and L eye redness. Had stopped taking lasix as prescribed because of concern it might hurt her kidney.  Creat was 2.13.  Volume overloaded.  Admitted for diuresis and AKI.  Has received IV lasix 40 mg bid x 2 days, UOP yest was 1.5 L.  Creat was up today at 2.42.  Asked to see for A/ CRF.   Patient is f/b Dr Florene Glen the last few years for CKD.  She hasn't been taking her lasix regularly because she is worried it will harm her one kidney.  SOB is better today but not too baseline.  Denies any prod cough, CP, fevers, n/v/d, abd pain, voiding issues.    Pt has hx of L nephrectomy and topical Rx of bladder Ca in 2011.  One month later she had guillian-barre syndrome treated w. PLEX.  She has chronic nausea they think is due to gastroparesis. She has IDDM.    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 Divorced in 1990, has one son who lives in [place] and works in Nurse, children's at [employer].  Wants to retake his MCAT and go to med school.       Date   Creat  eGFR 2011- 14  0.8- 0.9 > 60 - > 90 2015    1.0- 1.35 43- 67 2016   1.09- 1.36 46- 61 2017   1.31- 1.62 37 Oct 11, 2016  2.13  26 Sept 1, 2018  2.42  23   Old chart: Dec 2011 - bilat UE/ LE weakness due to Guillian-Barre syndrome.  Rx with plasma pheresis; prior hx DM, RCC sp nephrectomy, bladder Ca 1 x treatment, anxiety, DJD, depression, GERD  April 2012 - fall, gen weakn, [place] UTI, HTN, DM2.    April 2014 - diarrhea likely viral , dehydratiojn, DM2, HTN, chronic gait d/o due  to hx GBS  July 2015 - enteritis (N/V/D) due to Cdif infection.  AKI, improved with IVF's, creat 1.0 at dc.    ROS  denies CP  no joint pain   no HA  no blurry vision  no rash  no diarrhea  no nausea/ vomiting  no dysuria  no difficulty voiding  no change in urine color    Past Medical History  Past Medical History:  Diagnosis Date  . Cancer ([place])   . 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 Diabetes mellitus    2  . Guillain-Barre syndrome ([place])   . Hyperlipidemia   . Hypertension   . Renal disorder    L kidney removed  . Renal mass    BEING WORKED UP BY UROLOGY  . 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 CESAREAN SECTION    . CHOLECYSTECTOMY    . NEPHRECTOMY     Family History  Family History  Problem Relation Age of Onset  . Cancer Father        PROSTATE  . Healthy Mother   .  Cancer Sister        BREAST   Social History  reports that she has quit smoking. She has never used smokeless tobacco. She reports that she does not drink alcohol or use drugs. Allergies  Allergies  Allergen Reactions  . Biaxin [Clarithromycin] Other (See Comments)    "face turns red"  . Ciprofloxacin Other (See Comments)    Had Guillene-Barre sd.  . Codeine Nausea And Vomiting and Other (See Comments)    Hallucinations  . Erythromycin Nausea And Vomiting  . Influenza Vaccines     Had Guillene-Barre sd.  Celesta Gentile [Sitagliptin]   . Prednisone     Mania.  . Augmentin [Amoxicillin-Pot Clavulanate] Diarrhea   Home medications Prior to Admission medications   Medication Sig Start Date End Date Taking? Authorizing Provider  amitriptyline (ELAVIL) 10 MG tablet TAKE 4 TABLETS (40 MG TOTAL) BY MOUTH AT BEDTIME. 07/20/16  Yes Dohmeier, Asencion Partridge, MD  atorvastatin (LIPITOR) 10 MG tablet Take 1 tablet by mouth 3 times a week. 07/04/16  Yes Roma Schanz R, DO  azelastine (ASTELIN)  0.1 % nasal spray Place 2 sprays into both nostrils 2 (two) times daily. Use in each nostril as directed Patient taking differently: Place 2 sprays into both nostrils 2 (two) times daily. Use in each nostril as needed allergy 01/27/15  Yes Saguier, Iris Pert  Cholecalciferol (VITAMIN D3) 50000 units CAPS Take 50,000 Units by mouth once a week. 12/29/15  Yes Dohmeier, Asencion Partridge, MD  COD LIVER OIL PO Take 1 capsule by mouth every evening.    Yes [provider]  diclofenac (VOLTAREN) 50 MG EC tablet Take 50 mg by mouth 2 (two) times daily as needed for mild pain or moderate pain.  12/20/15  Yes [provider]  furosemide (LASIX) 40 MG tablet Take 40 mg by mouth as needed for fluid.    Yes [provider]  gabapentin (NEURONTIN) 300 MG capsule TAKE ONE CAPSULE BY MOUTH EVERY MORNING, 2 CAPSULES AT LUNCH AND 1 CAPSULE IN THE AFTERNOON AND 2 EVERY NIGHT AT BEDTIME 09/03/16  Yes Dohmeier, Asencion Partridge, MD  Insulin Glargine (LANTUS SOLOSTAR) 100 UNIT/ML Solostar Pen Inject 24 Units into the skin daily at 10 pm. Patient taking differently: Inject 22 Units into the skin daily at 10 pm.  12/19/15  Yes Philemon Kingdom, MD  insulin lispro (HUMALOG KWIKPEN) 100 UNIT/ML KiwkPen Inject 10-15 units three times daily with meals. 04/04/16  Yes Philemon Kingdom, MD  ipratropium (ATROVENT HFA) 17 MCG/ACT inhaler Inhale 2 puffs into the lungs every 6 (six) hours as needed for wheezing. 01/27/15  Yes Saguier, Percell Miller, PA-C  lisinopril (PRINIVIL,ZESTRIL) 20 MG tablet Take 20 mg by mouth at bedtime.    Yes [provider]  promethazine (PHENERGAN) 25 MG tablet Take 1 tablet (25 mg total) by mouth every 8 (eight) hours as needed for nausea or vomiting. 04/04/16  Yes Dohmeier, Asencion Partridge, MD  traMADol (ULTRAM) 50 MG tablet TAKE 1 EVERY MORNING AND 2 EVERY EVENING 04/24/16  Yes Dohmeier, Asencion Partridge, MD  albuterol (VENTOLIN HFA) 108 (90 BASE) MCG/ACT inhaler Inhale 2 puffs into the lungs every 6 (six) hours as  needed for wheezing or shortness of breath. Patient not taking: Reported on 10/11/2016 08/18/14   Colon Branch, MD  BD PEN NEEDLE NANO U/F 32G X 4 MM MISC USE 1X A DAY 08/29/15   Philemon Kingdom, MD  Blood Glucose Monitoring Suppl (ONETOUCH VERIO IQ SYSTEM) w/Device KIT Used to check sugar 2 times daily. 02/23/16  Philemon Kingdom, MD  glucose blood (ONETOUCH VERIO) test strip Use as instructed to check sugar 2 times daily 02/23/16   Philemon Kingdom, MD  HUMALOG KWIKPEN 100 UNIT/ML KiwkPen INJECT 10-15 UNITS THREE TIMES A DAY WITH MEALS Patient not taking: Reported on 10/11/2016 04/09/16   Philemon Kingdom, MD  Lancets [facility] ULTRASOFT) lancets Use as instructed to check sugar 2 times daily. 02/23/16   Philemon Kingdom, MD  meclizine (ANTIVERT) 25 MG tablet TAKE ONE TABLET BY MOUTH TWICE A DAY Patient not taking: Reported on 10/11/2016 03/13/16   Ann Held, DO  oxyCODONE-acetaminophen (PERCOCET/ROXICET) 5-325 MG per tablet Take 1 tablet by mouth every 4 (four) hours as needed. Patient not taking: Reported on 10/11/2016 03/15/14   Ward, Delice Bison, DO   Liver Function Tests No results for input(s): AST, ALT, ALKPHOS, BILITOT, PROT, ALBUMIN in the last 168 hours. No results for input(s): LIPASE, AMYLASE in the last 168 hours. CBC  Recent Labs Lab 10/11/16 0148 10/11/16 0431  WBC 6.5 8.2  NEUTROABS 4.4  --   HGB 11.3* 13.1  HCT 33.5* 39.2  MCV 88.9 89.1  PLT 211 774   Basic Metabolic Panel  Recent Labs Lab 10/11/16 0148 10/11/16 0431 10/12/16 0431 10/13/16 0515  NA 145  --  144 141  K 3.9  --  3.9 3.8  CL 108  --  105 103  CO2 30  --  33* 32  GLUCOSE 169*  --  128* 115*  BUN 26*  --  25* 26*  CREATININE 2.13* 2.05* 2.17* 2.42*  CALCIUM 8.7*  --  8.6* 8.3*   Iron/TIBC/Ferritin/ %Sat No results found for: IRON, TIBC, FERRITIN, IRONPCTSAT  Vitals:   10/12/16 1527 10/12/16 2120 10/13/16 0556 10/13/16 1338  BP: (!) 153/54 (!) 140/42 121/69 (!) 184/68  Pulse: 73 71  73 76  Resp: 18 20 20 18   Temp: 98.6 F (37 C) 98.7 F (37.1 C) 97.8 F (36.6 C) 98.5 F (36.9 C)  TempSrc: Oral Oral Oral Oral  SpO2: 95% 96% 99% 90%  Weight:   105.3 kg (232 lb 2.3 oz)   Height:       Exam Gen obese WF, not in distress No rash, cyanosis or gangrene Sclera anicteric, throat clear  No jvd or bruits Chest bibasilar rale 1/3 up L> R RRR no MRG Abd soft ntnd no mass or ascites +bs GU deferred MS no joint effusions or deformity Ext 1-2+ bilat pretib edema / no wounds or ulcers Neuro is alert, Ox 3 , nf   Home meds > elavil, lipitor, voltaren 50 po bid, lasix 40 prn, neurontin 300/600/300, lantus qd, humalog tid, atrovent, prinivil, phenergan, ultram, ventolin, antivert, percocet prn  Alb 2.0  CO2 32   BNP 146   Hb 13  plt 248 UNa = 118  UCr = 23 CT abd showed > Prior left nephrectomy.  No right ureteral stones or hydronephrosis.  Small bilateral adrenal adenomas. Small bilateral pleural effusions.  Bibasilar atelectasis. CXR >  Small left greater than right pleural effusions. Hazy atelectasis or infiltrate at the left base.  Borderline cardiomegaly ECHO > peak PAP 16 mmHg, LVEF = 55-60%, G2DD, valves wnl UA - not done   Impression: 1.  Acute on chronic renal failure - check UA and renal US, resume IV lasix higher dose, needs diuresis.  Creat will rise w/ diuresis due to hemoconcentration, explained to pt.  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  Cont to hold voltaren and lisinopril.  2.  Vol overload - not taking lasix properly at home 3.  Hx GBS 4.  HTN - BP's up, will start hydralazine, cont to hold ACEi for now 5.  IDDM per primary 6.  Hx RCC sp L nephrect   Plan - as above  Kelly Splinter MD [employer] pager [phone]   10/13/2016, 4:58 PM

## 2016-10-14 LAB — GLUCOSE, CAPILLARY
GLUCOSE-CAPILLARY: 108 mg/dL — AB (ref 65–99)
GLUCOSE-CAPILLARY: 159 mg/dL — AB (ref 65–99)
GLUCOSE-CAPILLARY: 249 mg/dL — AB (ref 65–99)
Glucose-Capillary: 184 mg/dL — ABNORMAL HIGH (ref 65–99)

## 2016-10-14 LAB — BASIC METABOLIC PANEL
ANION GAP: 7 (ref 5–15)
BUN: 27 mg/dL — ABNORMAL HIGH (ref 6–20)
CALCIUM: 8.7 mg/dL — AB (ref 8.9–10.3)
CO2: 34 mmol/L — AB (ref 22–32)
Chloride: 100 mmol/L — ABNORMAL LOW (ref 101–111)
Creatinine, Ser: 2.27 mg/dL — ABNORMAL HIGH (ref 0.44–1.00)
GFR, EST AFRICAN AMERICAN: 24 mL/min — AB (ref >60)
GFR, EST NON AFRICAN AMERICAN: 21 mL/min — AB (ref >60)
Glucose, Bld: 131 mg/dL — ABNORMAL HIGH (ref 65–99)
Potassium: 3.7 mmol/L (ref 3.5–5.1)
Sodium: 141 mmol/L (ref 135–145)

## 2016-10-14 MED ORDER — KETOTIFEN FUMARATE 0.025 % OP SOLN
1.0000 [drp] | Freq: Two times a day (BID) | OPHTHALMIC | Status: DC
  Administered 2016-10-14 – 2016-10-18 (×8): 1 [drp] via OPHTHALMIC
  Filled 2016-10-14: qty 5

## 2016-10-14 NOTE — Plan of Care (Signed)
Problem: Safety: Goal: Ability to remain free from injury will improve Outcome: Progressing Moderate fall risk.   Problem: Health Behavior/Discharge Planning: Goal: Ability to manage health-related needs will improve Outcome: Progressing .  Problem: Physical Regulation: Goal: Ability to maintain clinical measurements within normal limits will improve Outcome: Progressing . Goal: Will remain free from infection Outcome: Progressing .  Problem: Skin Integrity: Goal: Risk for impaired skin integrity will decrease Outcome: Progressing .  Problem: Tissue Perfusion: Goal: Risk factors for ineffective tissue perfusion will decrease Outcome: Progressing .  Problem: Activity: Goal: Risk for activity intolerance will decrease Outcome: Progressing Worked with PT. One assist OOB.  Sitting in chair for meal.   Problem: Fluid Volume: Goal: Ability to maintain a balanced intake and output will improve Outcome: Progressing Diuresing.

## 2016-10-14 NOTE — Evaluation (Signed)
Occupational Therapy Evaluation Patient Details Name: Nicole Strong MRN: [id number] DOB: [birth year] Today's Date: 10/14/2016    History of Present Illness This 66 year old female was admitted for  acute or chronic respiratory failure. She hs a PMH significant for chronic renal failure, L nephrectomy due to renal CA, DM, and s/p Guillian Barre Syndrome   Clinical Impression   Pt was admitted for the above. At baseline, she is mod I for adls. She currently needs min guard overall. Goals are for supervision level in acute setting. Pt would benefit from continued OT. She is agreeable to therapy in the hospital but does not want follow up at home    Follow Up Recommendations  No OT follow up (pt does not want follow up OT)    Equipment Recommendations  None recommended by OT    Recommendations for Other Services       Precautions / Restrictions Precautions Precautions: Fall Precaution Comments: watch O2 sats Restrictions Weight Bearing Restrictions: No      Mobility Bed Mobility Overal bed mobility: Modified Independent             General bed mobility comments: use of rail, which she has at home  Transfers Overall transfer level: Needs assistance Equipment used: Rolling walker (2 wheeled) Transfers: Sit to/from Stand Sit to Stand: Min guard         General transfer comment: safety    Balance                                           ADL either performed or assessed with clinical judgement   ADL Overall ADL's : Needs assistance/impaired Eating/Feeding: Modified independent   Grooming: Set up;Sitting   Upper Body Bathing: Set up;Sitting   Lower Body Bathing: Min guard;Sit to/from stand   Upper Body Dressing : Set up;Sitting   Lower Body Dressing: Min guard;Sit to/from stand Lower Body Dressing Details (indicate cue type and reason): min A if she needs to wear compression stockings Toilet Transfer: Min guard;Ambulation;RW (chair)    Toileting- Clothing Manipulation and Hygiene: Min guard;Sit to/from stand;Sitting/lateral lean         General ADL Comments: pt needed 1 liter 02. Sats down to 86% on RA sitting EOB.  She cannot make a full fist but manages to feed herself. She reports that she shakes. Talked to her about keeping elbows on table. Will drop off built up foam and washmitt to see if these would help her. Also discussed wearing small zippered bag around neck to carry cell phone.  She does not feel that [employer] therapies helped her much in the past, so she really doesn't want these services     Vision Patient Visual Report:  (intermittent diplopia from GBS)       Perception     Praxis      Pertinent Vitals/Pain Pain Assessment: No/denies pain (general malaise)     Hand Dominance     Extremity/Trunk Assessment Upper Extremity Assessment Upper Extremity Assessment: Generalized weakness (can't make a complete fist)   Lower Extremity Assessment Lower Extremity Assessment:  (neuropathy mostly in legs)       Communication Communication Communication: No difficulties   Cognition Arousal/Alertness: Awake/alert Behavior During Therapy: WFL for tasks assessed/performed Overall Cognitive Status: Within Functional Limits for tasks assessed  General Comments  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 She sometimes feels a little "wavy" when she sits up.      Exercises     Shoulder Instructions      Home Living Family/patient expects to be discharged to:: Private residence Living Arrangements: Alone   Type of Home: Apartment Home Access: Ramped entrance           Bathroom Shower/Tub: Tub/shower unit   Bathroom Toilet: Handicapped height     Home Equipment: Environmental consultant - 2 wheels;Wheelchair - manual;Tub bench;Bedside commode   Additional Comments: tends to furniture hold.  Pt is getting ready to move into a house with a level entrance.  Moving  at end of Sept. Son lives in [place] near son      Prior Functioning/Environment Level of Independence: Independent with assistive device(s)        Comments: friend gets groceries; pt mod I with everything except for driving        OT Problem List: Decreased strength;Decreased activity tolerance;Impaired balance (sitting and/or standing);Impaired vision/perception;Decreased knowledge of use of DME or AE;Cardiopulmonary status limiting activity      OT Treatment/Interventions: Self-care/ADL training;DME and/or AE instruction;Patient/family education;Balance training;Therapeutic activities    OT Goals(Current goals can be found in the care plan section) Acute Rehab OT Goals Patient Stated Goal: return to PLOF and move into accessible house at the end of the month OT Goal Formulation: With patient Time For Goal Achievement: 10/21/16 Potential to Achieve Goals: Good ADL Goals Pt Will Transfer to Toilet: with supervision;bedside commode;ambulating Pt Will Perform Toileting - Clothing Manipulation and hygiene: with supervision;sitting/lateral leans;sit to/from stand Additional ADL Goal #1: pt will gather clothes at supervision level (walker or w/c) and complete ADL without cues/assistance  OT Frequency: Min 2X/week   Barriers to D/C:            Co-evaluation PT/OT/SLP Co-Evaluation/Treatment: Yes Reason for Co-Treatment: For patient/therapist safety PT goals addressed during session: Mobility/safety with mobility OT goals addressed during session: ADL's and self-care      AM-PAC PT "6 Clicks" Daily Activity     Outcome Measure Help from another person eating meals?: None Help from another person taking care of personal grooming?: A Little Help from another person toileting, which includes using toliet, bedpan, or urinal?: A Little Help from another person bathing (including washing, rinsing, drying)?: A Little Help from another person to put on and taking off regular  upper body clothing?: A Little Help from another person to put on and taking off regular lower body clothing?: A Little 6 Click Score: 19   End of Session Nurse Communication: Mobility status  Activity Tolerance: Patient tolerated treatment well Patient left: in chair;with call bell/phone within reach  OT Visit Diagnosis: Unsteadiness on feet (R26.81)                Time: 2595-6387 OT Time Calculation (min): 45 min Charges:  OT General Charges $OT Visit: 1 Visit OT Evaluation $OT Eval Moderate Complexity: 1 Mod G-Codes:    Co-eval with PT for PT safety OT worked on [employer], OTR/L [phone] 10/14/2016  [place] 10/14/2016, 10:51 AM

## 2016-10-14 NOTE — Progress Notes (Signed)
PROGRESS NOTE    Nicole Strong  PIR:[id number] DOB: 18-Dec-1950 DOA: 10/11/2016 PCP: Ann Held, DO    Brief Narrative:  66 y.o. 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 Patient states over the last 4-5 days patient has been getting short of breath on exertion. Denies any orthopnea. Denies any chest pain and productive cough fever or chills. Patient has noticed increasing edema over the lower extremity. Patient has not been taking her Lasix due to history of solitary kidney.  Assessment & Plan:   Principal Problem:   Acute on chronic respiratory failure with hypoxia (HCC) Active Problems:   Essential hypertension   Uncontrolled type 2 diabetes mellitus with stage 2 chronic kidney disease, with long-term current use of insulin (HCC)   Axonal GBS (Guillain-Barre syndrome) (HCC)   CHF (congestive heart failure) ([place])   1. Acute diastolic congestive heart failure, chronicity unknown 1. 2-D echo performed, normal LVEF, grade 2 diastolic dysfunction 2. Pt had been continued on BID IV lasix with some improvement in LE edema 3. Cr has peaked to 2.4.  4. Pt to continue 1500cc fluid restricted diet 5. Appreciate Nephro input. Pt continued on 80mg  Q8hr lasix IV 2. Acute on chronic renal failure stage III with solitary kidney 1. Had earlier tolerated lasix 2. Continue to hold nephrotoxic agents 3. Pt followed by Dr. Florene Glen prior to admission. Nephrology consulted. Pt continued on 80mg  IV q8hr lasix 3. Hypertension 1. Patient continued on PRN hydralazine 2. ACEI presently on hold secondary to renal insufficiency above 3. Remains stable at present 4. Diabetes mellitus type 2 1. Long-acting insulin decreased from 24 units to 15 minutes due to worsening creatinine. 2. Cont SSI coverage 3. Glucose remains stable at this time 5. Anemia  probably secondary to chronic kidney disease 1. Currently remains stable at this time 6. Abdominal discomfort with poor appetite 1. CT abd reviewed 2. Patient with findings of stool, most notably in the transverse colon, corresponding to location of patient's abd discomfort 3. Plan to continue with cathartics as needed 7. History of GBS. 1. Currently stable  DVT prophylaxis: Heparin subQ Code Status: Full Family Communication: Pt in room, family not at bedside Disposition Plan: Uncertain at this time  Consultants:   Nephrology  Procedures:     Antimicrobials: Anti-infectives    None      Subjective: Denies sob or chest pain  Objective: Vitals:   10/13/16 0556 10/13/16 1338 10/13/16 2212 10/14/16 0632  BP: 121/69 (!) 184/68 (!) 185/68 (!) 131/59  Pulse: 73 76 70 69  Resp: 20 18 18 16   Temp: 97.8 F (36.6 C) 98.5 F (36.9 C) 98.7 F (37.1 C) 97.8 F (36.6 C)  TempSrc: Oral Oral Oral Oral  SpO2: 99% 90% 98% 100%  Weight: 105.3 kg (232 lb 2.3 oz)   105.2 kg (231 lb 14.8 oz)  Height:        Intake/Output Summary (Last 24 hours) at 10/14/16 0855 Last data filed at 10/14/16 0300  Gross per 24 hour  Intake              462 ml  Output             3400 ml  Net            -2938 ml   Filed Weights   10/11/16 0439 10/13/16 0556 10/14/16 6301  Weight: 97.5  kg (215 lb) 105.3 kg (232 lb 2.3 oz) 105.2 kg (231 lb 14.8 oz)    Examination General exam: Awake, laying in bed, in nad Respiratory system: Normal respiratory effort, no wheezing Cardiovascular system: regular rate, s1, s2 Gastrointestinal system: Soft, nondistended, positive BS Central nervous system: CN2-12 grossly intact, strength intact Extremities: Perfused, no clubbing Skin: Normal skin turgor, no notable skin lesions seen Psychiatry: Mood normal // no visual hallucinations   Data Reviewed: I have personally reviewed following labs and imaging studies  CBC:  Recent Labs Lab 10/11/16 0148  10/11/16 0431  WBC 6.5 8.2  NEUTROABS 4.4  --   HGB 11.3* 13.1  HCT 33.5* 39.2  MCV 88.9 89.1  PLT 211 756   Basic Metabolic Panel:  Recent Labs Lab 10/11/16 0148 10/11/16 0431 10/12/16 0431 10/13/16 0515 10/14/16 0435  NA 145  --  144 141 141  K 3.9  --  3.9 3.8 3.7  CL 108  --  105 103 100*  CO2 30  --  33* 32 34*  GLUCOSE 169*  --  128* 115* 131*  BUN 26*  --  25* 26* 27*  CREATININE 2.13* 2.05* 2.17* 2.42* 2.27*  CALCIUM 8.7*  --  8.6* 8.3* 8.7*  MG  --  2.0  --   --   --    GFR: Estimated Creatinine Clearance: 26.5 mL/min (A) (by C-G formula based on SCr of 2.27 mg/dL (H)). Liver Function Tests: No results for input(s): AST, ALT, ALKPHOS, BILITOT, PROT, ALBUMIN in the last 168 hours. No results for input(s): LIPASE, AMYLASE in the last 168 hours. No results for input(s): AMMONIA in the last 168 hours. Coagulation Profile: No results for input(s): INR, PROTIME in the last 168 hours. Cardiac Enzymes:  Recent Labs Lab 10/11/16 0431 10/11/16 1014 10/11/16 1628  TROPONINI <0.03 <0.03 <0.03   BNP (last 3 results) No results for input(s): PROBNP in the last 8760 hours. HbA1C: No results for input(s): HGBA1C in the last 72 hours. CBG:  Recent Labs Lab 10/13/16 0734 10/13/16 1206 10/13/16 1648 10/13/16 2210 10/14/16 0724  GLUCAP 98 191* 243* 194* 108*   Lipid Profile: No results for input(s): CHOL, HDL, LDLCALC, TRIG, CHOLHDL, LDLDIRECT in the last 72 hours. Thyroid Function Tests: No results for input(s): TSH, T4TOTAL, FREET4, T3FREE, THYROIDAB in the last 72 hours. Anemia Panel: No results for input(s): VITAMINB12, FOLATE, FERRITIN, TIBC, IRON, RETICCTPCT in the last 72 hours. Sepsis Labs: No results for input(s): PROCALCITON, LATICACIDVEN in the last 168 hours.  No results found for this or any previous visit (from the past 240 hour(s)).   Radiology Studies: [place] Retroperitoneal (renal,aorta,ivc Nodes)  Result Date: 10/13/2016 CLINICAL DATA:   66 year old female with acute on chronic renal failure. History of left nephrectomy. EXAM: RENAL / URINARY TRACT ULTRASOUND COMPLETE COMPARISON:  10/11/2016 CT and 04/12/2014 MR FINDINGS: Right Kidney: Length: 11.3 cm. Echogenicity within normal limits. No mass or hydronephrosis visualized. Left Kidney: Not visualized compatible with nephrectomy. Bladder: Appears normal for degree of bladder distention. IMPRESSION: 1. Unremarkable right kidney. 2. Status post left nephrectomy. Electronically Signed   By: Margarette Canada M.D.   On: 10/13/2016 18:47    Scheduled Meds: . amitriptyline  40 mg Oral QHS  . atorvastatin  10 mg Oral Once per day on Mon Wed Fri  . bacitracin-polymyxin b   Both Eyes Q4H  . docusate sodium  100 mg Oral Daily  . furosemide  80 mg Intravenous Q8H  . gabapentin  300 mg  Oral Q breakfast  . gabapentin  300 mg Oral QAC supper  . gabapentin  600 mg Oral Q lunch  . gabapentin  600 mg Oral QHS  . heparin  5,000 Units Subcutaneous Q8H  . hydrALAZINE  25 mg Oral Q8H  . insulin aspart  0-9 Units Subcutaneous TID WC  . insulin glargine  15 Units Subcutaneous Q2200  . polyethylene glycol  17 g Oral Daily  . traMADol  100 mg Oral QPM  . traMADol  50 mg Oral Daily   Continuous Infusions:   LOS: 3 days   CHIU, Orpah Melter, MD [facility] Pager [phone]  If 7PM-7AM, please contact night-coverage [email or web address] Password [email or web address] 10/14/2016, 8:55 AM

## 2016-10-14 NOTE — Evaluation (Signed)
Physical Therapy Evaluation Patient Details Name: Nicole Strong MRN: [id number] DOB: [birth year] Today's Date: 10/14/2016   History of Present Illness  This 66 year old female was admitted for  acute or chronic respiratory failure. 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  Pt should progress to dc home with intermittent assist of family/friends.    Follow Up Recommendations No PT follow up    Equipment Recommendations  None recommended by PT    Recommendations for Other Services       Precautions / Restrictions Precautions Precautions: Fall Precaution Comments: watch O2 sats Restrictions Weight Bearing Restrictions: No      Mobility  Bed Mobility Overal bed mobility: Needs Assistance Bed Mobility: Supine to Sit     Supine to sit: Modified independent (Device/Increase time)     General bed mobility comments: min A for LLE to get back onto bed  Transfers Overall transfer level: Needs assistance Equipment used: Rolling walker (2 wheeled) Transfers: Sit to/from Stand Sit to Stand: Min guard         General transfer comment: safety  Ambulation/Gait Ambulation/Gait assistance: Min guard Ambulation Distance (Feet): 130 Feet Assistive device: Rolling walker (2 wheeled) Gait Pattern/deviations: Step-through pattern;Decreased step length - right;Decreased step length - left;Shuffle;Trunk flexed Gait velocity: decr Gait velocity interpretation: Below normal speed for age/gender General Gait Details: min cues for posture and position from RW.  Distance ltd by fatigue and "my back just gives out"  Stairs            Wheelchair Mobility    Modified Rankin (Stroke Patients Only)       Balance Overall balance assessment: Needs  assistance Sitting-balance support: No upper extremity supported;Feet supported Sitting balance-Leahy Scale: Good     Standing balance support: No upper extremity supported Standing balance-Leahy Scale: Fair                               Pertinent Vitals/Pain Pain Assessment: No/denies pain    Home Living Family/patient expects to be discharged to:: Private residence Living Arrangements: Alone   Type of Home: Apartment Home Access: Ramped entrance     Home Layout: One level Home Equipment: [place] - 2 wheels;Wheelchair - manual;Tub bench;Bedside commode Additional Comments: tends to furniture hold.  Pt is getting ready to move into a house with a level entrance.  Moving at end of Sept. Son lives in [place] near son    Prior Function Level of Independence: Independent with assistive device(s)         Comments: friend gets groceries; pt mod I with everything except for driving     Hand Dominance        Extremity/Trunk Assessment   Upper Extremity Assessment Upper Extremity Assessment: Generalized weakness    Lower Extremity Assessment Lower Extremity Assessment: Generalized weakness;RLE deficits/detail;LLE deficits/detail RLE Sensation: history of peripheral neuropathy LLE Sensation: history of peripheral neuropathy       Communication   Communication: No difficulties  Cognition Arousal/Alertness: Awake/alert Behavior During Therapy: WFL for tasks assessed/performed Overall Cognitive Status: Within Functional Limits for tasks assessed  General Comments General comments (skin integrity, edema, etc.): 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 She sometimes feels a little "wavy" when she sits up.      Exercises     Assessment/Plan    PT Assessment Patient needs continued PT services  PT Problem List Decreased strength;Decreased activity tolerance;Decreased balance;Decreased  mobility;Decreased knowledge of use of DME;Obesity       PT Treatment Interventions DME instruction;Gait training;Functional mobility training;Therapeutic activities;Therapeutic exercise;Patient/family education;Balance training    PT Goals (Current goals can be found in the Care Plan section)  Acute Rehab PT Goals Patient Stated Goal: return to PLOF and move into accessible house at the end of the month PT Goal Formulation: With patient Time For Goal Achievement: 10/20/16 Potential to Achieve Goals: Good    Frequency Min 3X/week   Barriers to discharge        Co-evaluation PT/OT/SLP Co-Evaluation/Treatment: Yes Reason for Co-Treatment: For patient/therapist safety PT goals addressed during session: Mobility/safety with mobility OT goals addressed during session: ADL's and self-care       AM-PAC PT "6 Clicks" Daily Activity  Outcome Measure Difficulty turning over in bed (including adjusting bedclothes, sheets and blankets)?: A Little Difficulty moving from lying on back to sitting on the side of the bed? : A Little Difficulty sitting down on and standing up from a chair with arms (e.g., wheelchair, bedside commode, etc,.)?: A Little Help needed moving to and from a bed to chair (including a wheelchair)?: A Little Help needed walking in hospital room?: A Little Help needed climbing 3-5 steps with a railing? : A Lot 6 Click Score: 17    End of Session Equipment Utilized During Treatment: Gait belt;Oxygen Activity Tolerance: Patient tolerated treatment well;Patient limited by fatigue Patient left: in chair;with call bell/phone within reach Nurse Communication: Mobility status PT Visit Diagnosis: Unsteadiness on feet (R26.81);Muscle weakness (generalized) (M62.81);Difficulty in walking, not elsewhere classified (R26.2)    Time: 0539-7673 PT Time Calculation (min) (ACUTE ONLY): 45 min   Charges:   PT Evaluation $PT Eval Low Complexity: 1 Low PT Treatments $Gait  Training: 8-22 mins   PT G Codes:        Pg [phone]   [name] 10/14/2016, 12:30 PM

## 2016-10-14 NOTE — Progress Notes (Signed)
   10/14/16 1100  OT Visit Information  Last OT Received On 10/14/16  Assistance Needed +1  History of Present Illness This 66 year old female was admitted for  acute or chronic respiratory failure. 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 She liked both. Pt is able to put foam on/off utensil and hook velcro strap and take off washmitt herself. Pt needed to use 3:1 commode. Assisted back to bed after this  Bed Mobility  General bed mobility comments min A for LLE to get back onto bed  Restrictions  Weight Bearing Restrictions No  Transfers  Equipment used Rolling walker (2 wheeled)  Sit to Stand Min guard  General transfer comment safety  OT - End of Session  Activity Tolerance Patient tolerated treatment well  Patient left in bed;with call bell/phone within reach;with bed alarm set  OT Assessment/Plan  OT Visit Diagnosis Unsteadiness on feet (R26.81)  OT Frequency (ACUTE ONLY) Min 2X/week  Follow Up Recommendations No OT follow up  OT Equipment None recommended by OT  AM-PAC OT "6 Clicks" Daily Activity Outcome Measure  Help from another person eating meals? 4  Help from another person taking care of personal grooming? 3  Help from another person toileting, which includes using toliet, bedpan, or urinal? 3  Help from another person bathing (including washing, rinsing, drying)? 3  Help from another person to put on and taking off regular upper body clothing? 3  Help from another person to put on and taking off  regular lower body clothing? 3  6 Click Score 19  ADL G Code Conversion CK  OT Goal Progression  Progress towards OT goals Progressing toward goals  OT Time Calculation  OT Start Time (ACUTE ONLY) 1110  OT Stop Time (ACUTE ONLY) 1122  OT Time Calculation (min) 12 min  OT General Charges  $OT Visit 1 Visit  OT Treatments  $Self Care/Home Management  8-22 mins  Lesle Chris, OTR/L [phone] 10/14/2016

## 2016-10-14 NOTE — Progress Notes (Addendum)
[facility] Progress Note  Subjective: 3.4 L out yest on ^'d lasix IV.  Creat down slightly today.   Vitals:   10/13/16 1338 10/13/16 2212 10/14/16 0632 10/14/16 1333  BP: (!) 184/68 (!) 185/68 (!) 131/59 (!) 127/41  Pulse: 76 70 69 76  Resp: 18 18 16 16   Temp: 98.5 F (36.9 C) 98.7 F (37.1 C) 97.8 F (36.6 C) 98.5 F (36.9 C)  TempSrc: Oral Oral Oral Oral  SpO2: 90% 98% 100% 99%  Weight:   105.2 kg (231 lb 14.8 oz)   Height:        Inpatient medications: . amitriptyline  40 mg Oral QHS  . atorvastatin  10 mg Oral Once per day on Mon Wed Fri  . bacitracin-polymyxin b   Both Eyes Q4H  . docusate sodium  100 mg Oral Daily  . furosemide  80 mg Intravenous Q8H  . gabapentin  300 mg Oral Q breakfast  . gabapentin  300 mg Oral QAC supper  . gabapentin  600 mg Oral Q lunch  . gabapentin  600 mg Oral QHS  . heparin  5,000 Units Subcutaneous Q8H  . hydrALAZINE  25 mg Oral Q8H  . insulin aspart  0-9 Units Subcutaneous TID WC  . insulin glargine  15 Units Subcutaneous Q2200  . ketotifen  1 drop Both Eyes BID  . polyethylene glycol  17 g Oral Daily  . traMADol  100 mg Oral QPM  . traMADol  50 mg Oral Daily    acetaminophen **OR** acetaminophen, azelastine, hydrALAZINE, ondansetron **OR** ondansetron (ZOFRAN) IV, promethazine  Exam: Gen obese WF, not in distress No jvd or bruits Chest mostly clear today, improved RRR no MRG Abd soft ntnd no mass or ascites +bs Ext 1+ pretib edema, better Neuro is alert, Ox 3 , nf   Home meds > elavil, lipitor, voltaren 50 po bid, lasix 40 prn, neurontin 300/600/300, lantus qd, humalog tid, atrovent, prinivil, phenergan, ultram, ventolin, antivert, percocet prn  Alb 2.0  CO2 32   BNP 146   Hb 13  plt 248 UNa = 118  UCr = 23 CT abd showed > Prior left nephrectomy. No right ureteral stones or hydronephrosis.  Small bilateral adrenal adenomas. Small bilateral pleural effusions. Bibasilar atelectasis. CXR >  Small left  greater than right pleural effusions. Hazy atelectasis or infiltrate at the left base. ECHO > peak PAP 16 mmHg, LVEF = 55-60%, G2DD, valves wnl UA - negative   Impression: 1.  Acute on CKD3 - baseline creat 1.5 from 2017. Sees Dr Florene Glen from [facility].  Acute likely due to decomp CHF.  Considerable vol overload and creat down w/ ^'d diuretics. Cont IV lasix x 24-48 hrs more.  UA negative, renal US normal L kidney, R absent.  F/U labs in am.  2.  Vol overload/ decomp diast CHF - not taking lasix properly at home. See above 3.  Hx GBS 4.  HTN - BP's up, will start hydralazine, cont to hold ACEi for now 5.  IDDM per primary 6.  Hx RCC sp L nephrect   Plan - as above   Kelly Splinter MD [facility] pager [phone]   10/14/2016, 4:51 PM    Recent Labs Lab 10/12/16 0431 10/13/16 0515 10/14/16 0435  NA 144 141 141  K 3.9 3.8 3.7  CL 105 103 100*  CO2 33* 32 34*  GLUCOSE 128* 115* 131*  BUN 25* 26* 27*  CREATININE 2.17* 2.42* 2.27*  CALCIUM 8.6* 8.3*  8.7*   No results for input(s): AST, ALT, ALKPHOS, BILITOT, PROT, ALBUMIN in the last 168 hours.  Recent Labs Lab 10/11/16 0148 10/11/16 0431  WBC 6.5 8.2  NEUTROABS 4.4  --   HGB 11.3* 13.1  HCT 33.5* 39.2  MCV 88.9 89.1  PLT 211 248   Iron/TIBC/Ferritin/ %Sat No results found for: IRON, TIBC, FERRITIN, IRONPCTSAT

## 2016-10-15 ENCOUNTER — Encounter (HOSPITAL_COMMUNITY): Payer: Self-pay

## 2016-10-15 DIAGNOSIS — N171 Acute kidney failure with acute cortical necrosis: Secondary | ICD-10-CM

## 2016-10-15 LAB — BASIC METABOLIC PANEL
Anion gap: 7 (ref 5–15)
BUN: 34 mg/dL — ABNORMAL HIGH (ref 6–20)
CALCIUM: 8.5 mg/dL — AB (ref 8.9–10.3)
CO2: 36 mmol/L — AB (ref 22–32)
CREATININE: 2.51 mg/dL — AB (ref 0.44–1.00)
Chloride: 96 mmol/L — ABNORMAL LOW (ref 101–111)
GFR calc Af Amer: 22 mL/min — ABNORMAL LOW (ref >60)
GFR, EST NON AFRICAN AMERICAN: 19 mL/min — AB (ref >60)
GLUCOSE: 156 mg/dL — AB (ref 65–99)
Potassium: 3.9 mmol/L (ref 3.5–5.1)
Sodium: 139 mmol/L (ref 135–145)

## 2016-10-15 LAB — GLUCOSE, CAPILLARY
Glucose-Capillary: 165 mg/dL — ABNORMAL HIGH (ref 65–99)
Glucose-Capillary: 228 mg/dL — ABNORMAL HIGH (ref 65–99)
Glucose-Capillary: 193 mg/dL — ABNORMAL HIGH (ref 65–99)
Glucose-Capillary: 247 mg/dL — ABNORMAL HIGH (ref 65–99)

## 2016-10-15 MED ORDER — FUROSEMIDE 40 MG PO TABS
40.0000 mg | ORAL_TABLET | Freq: Two times a day (BID) | ORAL | Status: DC
  Administered 2016-10-15 – 2016-10-17 (×4): 40 mg via ORAL
  Filled 2016-10-15 (×4): qty 1

## 2016-10-15 NOTE — Progress Notes (Signed)
PROGRESS NOTE    Nicole Strong  QPR:[id number] DOB: 24-Sep-1950 DOA: 10/11/2016 PCP: Ann Held, DO    Brief Narrative:  66 y.o. 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 Patient states over the last 4-5 days patient has been getting short of breath on exertion. Denies any orthopnea. Denies any chest pain and productive cough fever or chills. Patient has noticed increasing edema over the lower extremity. Patient has not been taking her Lasix due to history of solitary kidney.  Assessment & Plan:   Principal Problem:   Acute on chronic respiratory failure with hypoxia (HCC) Active Problems:   Essential hypertension   Uncontrolled type 2 diabetes mellitus with stage 2 chronic kidney disease, with long-term current use of insulin (HCC)   Axonal GBS (Guillain-Barre syndrome) (HCC)   CHF (congestive heart failure) ([place])   1. Acute diastolic congestive heart failure, chronicity unknown 1. 2-D echo performed, normal LVEF, grade 2 diastolic dysfunction 2. Pt had been continued on BID IV lasix with some improvement in LE edema 3. Cr has peaked to 2.4.  4. Pt to continue 1500cc fluid restricted diet 5. Appreciate Nephro input. Pt continued on 80mg  Q8hr lasix IV 2. Acute on chronic renal failure stage III with solitary kidney 1. Had earlier tolerated lasix 2. Continue to hold nephrotoxic agents 3. Pt followed by Dr. Florene Glen prior to admission. Nephrology consulted and recommendations appreciated. Patient improved with 80mg  IV lasix 3. Hypertension 1. Patient continued on PRN hydralazine 2. ACEI presently on hold secondary to renal insufficiency above 3. Currently stable 4. Diabetes mellitus type 2 1. Long-acting insulin was decreased from 24 units to 15 minutes due to worsening creatinine. 2. Cont SSI coverage 3. Glucose currently  stable at this time 5. Anemia probably secondary to chronic kidney disease 1. Remains stable at this time 6. Abdominal discomfort with poor appetite 1. CT abd reviewed 2. Patient with findings of stool, most notably in the transverse colon, corresponding to location of patient's abd discomfort 3. Will continue with cathartics as needed. No BM yet today or yesterday 7. History of GBS. 1. Remains stable  DVT prophylaxis: Heparin subQ Code Status: Full Family Communication: Pt in room, family not at bedside Disposition Plan: Uncertain at this time  Consultants:   Nephrology  Procedures:     Antimicrobials: Anti-infectives    None      Subjective: NO complaints at tpresent  Objective: Vitals:   10/14/16 2049 10/14/16 2100 10/15/16 0615 10/15/16 1245  BP: (!) 141/47  (!) 137/59 136/60  Pulse: 78  79 77  Resp: 18  18 18   Temp: 98.5 F (36.9 C)  98.5 F (36.9 C) 98.7 F (37.1 C)  TempSrc: Oral  Oral Oral  SpO2: (!) 88% 94% 93% 94%  Weight:   103.9 kg (229 lb)   Height:        Intake/Output Summary (Last 24 hours) at 10/15/16 1518 Last data filed at 10/15/16 0953  Gross per 24 hour  Intake              642 ml  Output             4750 ml  Net            -4108 ml   Filed Weights   10/13/16 0556 10/14/16 0632 10/15/16 0615  Weight: 105.3 kg (232 lb 2.3 oz) 105.2  kg (231 lb 14.8 oz) 103.9 kg (229 lb)    Examination General exam: Conversant, in no acute distress Respiratory system: normal chest rise, clear, no audible wheezing Cardiovascular system: regular rhythm, s1-s2 Gastrointestinal system: Nondistended, nontender, pos BS Central nervous system: No seizures, no tremors Extremities: No cyanosis, no joint deformities Skin: No rashes, no pallor Psychiatry: Affect normal // no auditory hallucinations   Data Reviewed: I have personally reviewed following labs and imaging studies  CBC:  Recent Labs Lab 10/11/16 0148 10/11/16 0431  WBC 6.5 8.2    NEUTROABS 4.4  --   HGB 11.3* 13.1  HCT 33.5* 39.2  MCV 88.9 89.1  PLT 211 761   Basic Metabolic Panel:  Recent Labs Lab 10/11/16 0148 10/11/16 0431 10/12/16 0431 10/13/16 0515 10/14/16 0435 10/15/16 0524  NA 145  --  144 141 141 139  K 3.9  --  3.9 3.8 3.7 3.9  CL 108  --  105 103 100* 96*  CO2 30  --  33* 32 34* 36*  GLUCOSE 169*  --  128* 115* 131* 156*  BUN 26*  --  25* 26* 27* 34*  CREATININE 2.13* 2.05* 2.17* 2.42* 2.27* 2.51*  CALCIUM 8.7*  --  8.6* 8.3* 8.7* 8.5*  MG  --  2.0  --   --   --   --    GFR: Estimated Creatinine Clearance: 23.8 mL/min (A) (by C-G formula based on SCr of 2.51 mg/dL (H)). Liver Function Tests: No results for input(s): AST, ALT, ALKPHOS, BILITOT, PROT, ALBUMIN in the last 168 hours. No results for input(s): LIPASE, AMYLASE in the last 168 hours. No results for input(s): AMMONIA in the last 168 hours. Coagulation Profile: No results for input(s): INR, PROTIME in the last 168 hours. Cardiac Enzymes:  Recent Labs Lab 10/11/16 0431 10/11/16 1014 10/11/16 1628  TROPONINI <0.03 <0.03 <0.03   BNP (last 3 results) No results for input(s): PROBNP in the last 8760 hours. HbA1C: No results for input(s): HGBA1C in the last 72 hours. CBG:  Recent Labs Lab 10/14/16 1209 10/14/16 1603 10/14/16 2047 10/15/16 0748 10/15/16 1244  GLUCAP 184* 159* 249* 165* 228*   Lipid Profile: No results for input(s): CHOL, HDL, LDLCALC, TRIG, CHOLHDL, LDLDIRECT in the last 72 hours. Thyroid Function Tests: No results for input(s): TSH, T4TOTAL, FREET4, T3FREE, THYROIDAB in the last 72 hours. Anemia Panel: No results for input(s): VITAMINB12, FOLATE, FERRITIN, TIBC, IRON, RETICCTPCT in the last 72 hours. Sepsis Labs: No results for input(s): PROCALCITON, LATICACIDVEN in the last 168 hours.  No results found for this or any previous visit (from the past 240 hour(s)).   Radiology Studies: [place] Retroperitoneal (renal,aorta,ivc Nodes)  Result Date:  10/13/2016 CLINICAL DATA:  66 year old female with acute on chronic renal failure. History of left nephrectomy. EXAM: RENAL / URINARY TRACT ULTRASOUND COMPLETE COMPARISON:  10/11/2016 CT and 04/12/2014 MR FINDINGS: Right Kidney: Length: 11.3 cm. Echogenicity within normal limits. No mass or hydronephrosis visualized. Left Kidney: Not visualized compatible with nephrectomy. Bladder: Appears normal for degree of bladder distention. IMPRESSION: 1. Unremarkable right kidney. 2. Status post left nephrectomy. Electronically Signed   By: Margarette Canada M.D.   On: 10/13/2016 18:47    Scheduled Meds: . amitriptyline  40 mg Oral QHS  . atorvastatin  10 mg Oral Once per day on Mon Wed Fri  . bacitracin-polymyxin b   Both Eyes Q4H  . docusate sodium  100 mg Oral Daily  . gabapentin  300 mg Oral Q breakfast  .  gabapentin  300 mg Oral QAC supper  . gabapentin  600 mg Oral Q lunch  . gabapentin  600 mg Oral QHS  . heparin  5,000 Units Subcutaneous Q8H  . hydrALAZINE  25 mg Oral Q8H  . insulin aspart  0-9 Units Subcutaneous TID WC  . insulin glargine  15 Units Subcutaneous Q2200  . ketotifen  1 drop Both Eyes BID  . polyethylene glycol  17 g Oral Daily  . traMADol  100 mg Oral QPM  . traMADol  50 mg Oral Daily   Continuous Infusions:   LOS: 4 days   CHIU, Orpah Melter, MD [facility] Pager [phone]  If 7PM-7AM, please contact night-coverage [email or web address] Password [email or web address] 10/15/2016, 3:18 PM

## 2016-10-15 NOTE — Progress Notes (Signed)
Occupational Therapy Treatment Patient Details Name: Nicole Strong MRN: [id number] DOB: 06/18/1950 Today's Date: 10/15/2016    History of present illness This 66 year old female was admitted for  acute or chronic respiratory failure. 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    Follow Up Recommendations  No OT follow up    Equipment Recommendations  None recommended by OT    Recommendations for Other Services      Precautions / Restrictions Precautions Precautions: Fall Precaution Comments: monitor O2 sats Restrictions Weight Bearing Restrictions: No       Mobility Bed Mobility Overal bed mobility: Needs Assistance Bed Mobility: Sit to Supine     Supine to sit: Supervision Sit to supine: Min assist   General bed mobility comments: HOB elevated and increased time  Transfers Overall transfer level: Needs assistance Equipment used: Rolling walker (2 wheeled) Transfers: Sit to/from Stand Sit to Stand: Min assist Stand pivot transfers: Min assist       General transfer comment: <25% VC's on safety with turns.  AQlso assisted in bathroom with toilet transfer    Balance Overall balance assessment: Needs assistance Sitting-balance support: No upper extremity supported;Feet supported Sitting balance-Leahy Scale: Good     Standing balance support: No upper extremity supported Standing balance-Leahy Scale: Fair                             ADL either performed or assessed with clinical judgement   ADL Overall ADL's : Needs assistance/impaired                         Toilet Transfer: Minimal assistance;RW;BSC   Toileting- Clothing Manipulation and Hygiene: Minimal assistance;Sit to/from stand;Cueing for safety;Cueing for sequencing         General ADL Comments: Pt exhausted from being up in chair.  Pt wanted to get  back to bed. OT did educated pt on AE but pt too fatigued to practice, Pt would like to practice next OT viist     Vision Patient Visual Report: No change from baseline     Perception     Praxis      Cognition Arousal/Alertness: Awake/alert Behavior During Therapy: WFL for tasks assessed/performed Overall Cognitive Status: Within Functional Limits for tasks assessed                                                     Pertinent Vitals/ Pain       Pain Assessment: No/denies pain         Frequency  Min 2X/week        Progress Toward Goals  OT Goals(current goals can now be found in the care plan section)  Progress towards OT goals: Progressing toward goals     Plan Discharge plan remains appropriate    Co-evaluation                 AM-PAC PT "6 Clicks" Daily Activity     Outcome Measure   Help from another person eating meals?: None Help from another person taking care of personal grooming?: A Little Help from another person toileting, which  includes using toliet, bedpan, or urinal?: A Little Help from another person bathing (including washing, rinsing, drying)?: A Little Help from another person to put on and taking off regular upper body clothing?: A Little Help from another person to put on and taking off regular lower body clothing?: A Little 6 Click Score: 19    End of Session Equipment Utilized During Treatment: Rolling walker  OT Visit Diagnosis: Unsteadiness on feet (R26.81)   Activity Tolerance Patient tolerated treatment well   Patient Left in bed;with call bell/phone within reach;with bed alarm set   Nurse Communication Mobility status        Time: 6701-4103 OT Time Calculation (min): 9 min  Charges: OT General Charges $OT Visit: 1 Visit OT Treatments $Self Care/Home Management : 8-22 mins  [place], [place]   Betsy Pries 10/15/2016, 2:34 PM

## 2016-10-15 NOTE — Progress Notes (Signed)
Physical Therapy Treatment Patient Details Name: Nicole Strong MRN: [id number] DOB: 1950/10/22 Today's Date: 10/15/2016    History of Present Illness This 66 year old female was admitted for  acute or chronic respiratory failure. 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  RA at rest 91%.  Amb on RA decreased to 86%.  amb pt in hallway using a 4 WW and instructed on safety and proper use to sit on rollator seat when  fatigue.  Instructed pt to park rollator against wall for safety.  Prior pt stated she would walk with her walker then push wheelchair behind her for a seat.    SATURATION QUALIFICATIONS: (This note is used to comply with regulatory documentation for home oxygen)  Patient Saturations on Room Air at Rest = 91%  Patient Saturations on Room Air while Ambulating = 86%  Patient Saturations on 2 Liters of oxygen while Ambulating 18 feet= 92%  Please briefly explain why patient needs home oxygen:  Pt required supplemental oxygen to achieve therapeutic level   Follow Up Recommendations  No PT follow up     Equipment Recommendations   (2WU Rollator)    Recommendations for Other Services       Precautions / Restrictions Precautions Precaution Comments: monitor O2 sats Restrictions Weight Bearing Restrictions: No    Mobility  Bed Mobility Overal bed mobility: Needs Assistance Bed Mobility: Supine to Sit     Supine to sit: Supervision     General bed mobility comments: HOB elevated and increased time  Transfers Overall transfer level: Needs assistance Equipment used: 4-wheeled walker Transfers: Sit to/from [employer] Transfers Sit to Stand: Supervision;Min guard Stand pivot transfers: Supervision;Min guard       General transfer comment: <25% VC's on safety with turns.  AQlso assisted in bathroom with toilet  transfer  Ambulation/Gait Ambulation/Gait assistance: Min guard Ambulation Distance (Feet): 115 Feet (required x 2 sitting rest breaks )   Gait Pattern/deviations: Step-to pattern;Step-through pattern;Drifts right/left Gait velocity: decreased   General Gait Details: 25% VC's safety with turns and proper safety tech to park rollator against wall before sitting.  Limited distance due to MOD c/o fatigue.   Stairs            Wheelchair Mobility    Modified Rankin (Stroke Patients Only)       Balance                                            Cognition Arousal/Alertness: Awake/alert Behavior During Therapy: WFL for tasks assessed/performed Overall Cognitive Status: Within Functional Limits for tasks assessed                                        Exercises      General Comments        Pertinent Vitals/Pain      Home Living                      Prior Function            PT Goals (current goals can now be found in  the care plan section) Progress towards PT goals: Progressing toward goals    Frequency    Min 3X/week      PT Plan Current plan remains appropriate    Co-evaluation              AM-PAC PT "6 Clicks" Daily Activity  Outcome Measure  Difficulty turning over in bed (including adjusting bedclothes, sheets and blankets)?: A Little Difficulty moving from lying on back to sitting on the side of the bed? : A Little Difficulty sitting down on and standing up from a chair with arms (e.g., wheelchair, bedside commode, etc,.)?: A Little Help needed moving to and from a bed to chair (including a wheelchair)?: A Little Help needed walking in hospital room?: A Little Help needed climbing 3-5 steps with a railing? : A Lot 6 Click Score: 17    End of Session Equipment Utilized During Treatment: Gait belt;Oxygen Activity Tolerance: Patient tolerated treatment well Patient left: in chair;with call  bell/phone within reach;with chair alarm set Nurse Communication: Mobility status PT Visit Diagnosis: Unsteadiness on feet (R26.81);Muscle weakness (generalized) (M62.81);Difficulty in walking, not elsewhere classified (R26.2)     Time: 5809-9833 PT Time Calculation (min) (ACUTE ONLY): 28 min  Charges:  $Gait Training: 8-22 mins $Therapeutic Activity: 8-22 mins                    G Codes:       {Judeth Gilles  PTA [facility]  Acute  Rehab Pager      [phone]

## 2016-10-15 NOTE — Progress Notes (Signed)
[facility] Progress Note  Subjective: 4L uop yest , and creat bumped up a bit.  Have stopped IV lasix.    Vitals:   10/14/16 1333 10/14/16 2049 10/14/16 2100 10/15/16 0615  BP: (!) 127/41 (!) 141/47  (!) 137/59  Pulse: 76 78  79  Resp: 16 18  18   Temp: 98.5 F (36.9 C) 98.5 F (36.9 C)  98.5 F (36.9 C)  TempSrc: Oral Oral  Oral  SpO2: 99% (!) 88% 94% 93%  Weight:    103.9 kg (229 lb)  Height:        Inpatient medications: . amitriptyline  40 mg Oral QHS  . atorvastatin  10 mg Oral Once per day on Mon Wed Fri  . bacitracin-polymyxin b   Both Eyes Q4H  . docusate sodium  100 mg Oral Daily  . gabapentin  300 mg Oral Q breakfast  . gabapentin  300 mg Oral QAC supper  . gabapentin  600 mg Oral Q lunch  . gabapentin  600 mg Oral QHS  . heparin  5,000 Units Subcutaneous Q8H  . hydrALAZINE  25 mg Oral Q8H  . insulin aspart  0-9 Units Subcutaneous TID WC  . insulin glargine  15 Units Subcutaneous Q2200  . ketotifen  1 drop Both Eyes BID  . polyethylene glycol  17 g Oral Daily  . traMADol  100 mg Oral QPM  . traMADol  50 mg Oral Daily    acetaminophen **OR** acetaminophen, azelastine, hydrALAZINE, ondansetron **OR** ondansetron (ZOFRAN) IV, promethazine  Exam: Gen obese WF, not in distress No jvd or bruits Chest mostly clear today, improved RRR no MRG Abd soft ntnd no mass or ascites +bs Ext 1+ pretib edema, better Neuro is alert, Ox 3 , nf   Home meds > elavil, lipitor, voltaren 50 po bid, lasix 40 prn, neurontin 300/600/300, lantus qd, humalog tid, atrovent, prinivil, phenergan, ultram, ventolin, antivert, percocet prn  Alb 2.0  CO2 32   BNP 146   Hb 13  plt 248 UNa = 118  UCr = 23 CT abd showed > Prior left nephrectomy. No right ureteral stones or hydronephrosis.  Small bilateral adrenal adenomas. Small bilateral pleural effusions. Bibasilar atelectasis. CXR >  Small left greater than right pleural effusions. Hazy atelectasis or infiltrate at the  left base. ECHO > peak PAP 16 mmHg, LVEF = 55-60%, G2DD, valves wnl UA - negative   Impression: 1.  Acute on CKD3 - baseline creat 1.9 from office labs recently.  Creat up due to decomp CHF, responded to higher dose IV lasix. Vol overload resolved, will start on po lasix at 40 bid (was on 40 qd pta). No other suggestions, will sign off. Pt knows to schedule a f/u appt with Dr Florene Glen after dc. Also told her its important to take BP and weigh herself on a daily basis.  2.  Vol overload/ decomp diast CHF - was not taking lasix.  3.  Hx GBS 4.  HTN - recommend avoid ace inhibitors in this lady w/ one kidney. On hydralazine now w good control.   5.  IDDM per primary 6.  Hx RCC sp L nephrect   Plan - as above   Kelly Splinter MD [facility] pager [phone]   10/15/2016, 11:47 AM    Recent Labs Lab 10/13/16 0515 10/14/16 0435 10/15/16 0524  NA 141 141 139  K 3.8 3.7 3.9  CL 103 100* 96*  CO2 32 34* 36*  GLUCOSE 115* 131* 156*  BUN 26* 27* 34*  CREATININE 2.42* 2.27* 2.51*  CALCIUM 8.3* 8.7* 8.5*   No results for input(s): AST, ALT, ALKPHOS, BILITOT, PROT, ALBUMIN in the last 168 hours.  Recent Labs Lab 10/11/16 0148 10/11/16 0431  WBC 6.5 8.2  NEUTROABS 4.4  --   HGB 11.3* 13.1  HCT 33.5* 39.2  MCV 88.9 89.1  PLT 211 248   Iron/TIBC/Ferritin/ %Sat No results found for: IRON, TIBC, FERRITIN, IRONPCTSAT

## 2016-10-16 ENCOUNTER — Inpatient Hospital Stay (HOSPITAL_COMMUNITY): Payer: Medicare Other

## 2016-10-16 ENCOUNTER — Encounter (HOSPITAL_COMMUNITY): Payer: Self-pay

## 2016-10-16 LAB — GLUCOSE, CAPILLARY
Glucose-Capillary: 137 mg/dL — ABNORMAL HIGH (ref 65–99)
Glucose-Capillary: 225 mg/dL — ABNORMAL HIGH (ref 65–99)
Glucose-Capillary: 221 mg/dL — ABNORMAL HIGH (ref 65–99)
Glucose-Capillary: 220 mg/dL — ABNORMAL HIGH (ref 65–99)

## 2016-10-16 LAB — BASIC METABOLIC PANEL
ANION GAP: 6 (ref 5–15)
BUN: 36 mg/dL — AB (ref 6–20)
CALCIUM: 8.4 mg/dL — AB (ref 8.9–10.3)
CO2: 36 mmol/L — ABNORMAL HIGH (ref 22–32)
CREATININE: 2.58 mg/dL — AB (ref 0.44–1.00)
Chloride: 95 mmol/L — ABNORMAL LOW (ref 101–111)
GFR calc Af Amer: 21 mL/min — ABNORMAL LOW (ref >60)
GFR, EST NON AFRICAN AMERICAN: 18 mL/min — AB (ref >60)
GLUCOSE: 184 mg/dL — AB (ref 65–99)
Potassium: 4 mmol/L (ref 3.5–5.1)
Sodium: 137 mmol/L (ref 135–145)

## 2016-10-16 MED ORDER — DOCUSATE SODIUM 100 MG PO CAPS: 100.0000 mg | ORAL_CAPSULE | Freq: Every day | ORAL | 0 refills | Status: AC

## 2016-10-16 MED ORDER — FUROSEMIDE 40 MG PO TABS: 40.0000 mg | ORAL_TABLET | Freq: Two times a day (BID) | ORAL | 0 refills | Status: DC

## 2016-10-16 MED ORDER — INSULIN ASPART 100 UNIT/ML ~~LOC~~ SOLN
3.0000 [IU] | Freq: Three times a day (TID) | SUBCUTANEOUS | Status: DC
  Administered 2016-10-16 – 2016-10-18 (×6): 3 [IU] via SUBCUTANEOUS

## 2016-10-16 MED ORDER — INSULIN GLARGINE 100 UNIT/ML SOLOSTAR PEN: 15.0000 [IU] | PEN_INJECTOR | Freq: Every day | SUBCUTANEOUS | 5 refills | Status: AC

## 2016-10-16 NOTE — Consult Note (Signed)
   [facility] CM Inpatient Consult   10/16/2016  Nicole Strong [birth year] [id number]    [facility] Care Management referral received.   Spoke with [name] at bedside. Confirmed Primary Care MD is Dr. Carollee Herter. This practice performs their post hospital transition of care calls.   [name] denies having any immediate [facility] Care Management needs at this time. She reports she is supposed to move to [place], [place] at the end of September to be closer to her son.   She declined the need for EMMI calls.   Provided [facility] Care Management brochure and 24-hr nurse line magnet for her to use if needed.   Appreciative of visit.  Made inpatient RNCM aware of above.   Marthenia Rolling, MSN-Ed, RN,BSN [facility] Liaison [phone]

## 2016-10-16 NOTE — Progress Notes (Signed)
Physical Therapy Treatment Patient Details Name: Nicole Strong MRN: [id number] DOB: 04-12-1950 Today's Date: 10/16/2016    History of Present Illness This 67 year old female was admitted for acute or chronic respiratory failure. 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   SATURATION QUALIFICATIONS: (This note is used to comply with regulatory documentation for home oxygen)  Patient Saturations on Room Air at Rest = 83%  Patient Saturations on Room Air while Ambulating = n/a  Patient Saturations on 1 Liters of oxygen while Ambulating = 97%  Please briefly explain why patient needs home oxygen: to improve oxygen saturations above 88% at rest and during activity.   Follow Up Recommendations  No PT follow up     Equipment Recommendations  Other (comment) (4 wheeled RW withs seat)    Recommendations for Other Services       Precautions / Restrictions Precautions Precautions: Fall Precaution Comments: monitor O2 sats    Mobility  Bed Mobility Overal bed mobility: Needs Assistance Bed Mobility: Supine to Sit;Sit to Supine     Supine to sit: Min guard Sit to supine: Min assist   General bed mobility comments: slight assist for LEs onto bed per pt request  Transfers Overall transfer level: Needs assistance Equipment used: Rolling walker (2 wheeled) Transfers: Sit to/from Stand Sit to Stand: Min guard         General transfer comment: verbal cues for hand placement  Ambulation/Gait Ambulation/Gait assistance: Min guard Ambulation Distance (Feet): 100 Feet Assistive device: Rolling walker (2 wheeled) Gait Pattern/deviations: Step-through pattern;Decreased stride length     General Gait Details: standing rest break resting against wall approx 50 feet, remained on 1L O2 [place], pt reports mild SOB and increased fatigue,  Spo2 97% on 1L O2 during ambulation   Stairs            Wheelchair Mobility    Modified Rankin (Stroke Patients Only)       Balance                                            Cognition Arousal/Alertness: Awake/alert Behavior During Therapy: WFL for tasks assessed/performed Overall Cognitive Status: Within Functional Limits for tasks assessed                                        Exercises      General Comments        Pertinent Vitals/Pain Pain Assessment: 0-10 Pain Score: 3  Pain Location: back (chronic) Pain Descriptors / Indicators: Aching Pain Intervention(s): Limited activity within patient's tolerance;Monitored during session;Repositioned    Home Living                      Prior Function            PT Goals (current goals can now be found in the care plan section) Progress towards PT goals: Progressing toward goals    Frequency    Min 3X/week      PT Plan Current plan remains appropriate    Co-evaluation  AM-PAC PT "6 Clicks" Daily Activity  Outcome Measure  Difficulty turning over in bed (including adjusting bedclothes, sheets and blankets)?: A Little Difficulty moving from lying on back to sitting on the side of the bed? : A Little Difficulty sitting down on and standing up from a chair with arms (e.g., wheelchair, bedside commode, etc,.)?: A Little Help needed moving to and from a bed to chair (including a wheelchair)?: A Little Help needed walking in hospital room?: A Little Help needed climbing 3-5 steps with a railing? : A Little 6 Click Score: 18    End of Session Equipment Utilized During Treatment: Gait belt;Oxygen Activity Tolerance: Patient tolerated treatment well Patient left: with call bell/phone within reach;in bed;with bed alarm set Nurse Communication: Mobility status PT Visit Diagnosis: Difficulty in walking, not elsewhere classified (R26.2);Muscle  weakness (generalized) (M62.81)     Time: 8850-2774 PT Time Calculation (min) (ACUTE ONLY): 25 min  Charges:  $Gait Training: 8-22 mins                    G Codes:       Carmelia Bake, PT, DPT 10/16/2016 Pager: [phone]  York Ram E 10/16/2016, 2:26 PM

## 2016-10-16 NOTE — Progress Notes (Signed)
Inpatient Diabetes Program Recommendations  AACE/ADA: New Consensus Statement on Inpatient Glycemic Control (2015)  Target Ranges:  Prepandial:   less than 140 mg/dL      Peak postprandial:   less than 180 mg/dL (1-2 hours)      Critically ill patients:  140 - 180 mg/dL   Lab Results  Component Value Date   GLUCAP 225 (H) 10/16/2016   HGBA1C 6.7 03/20/2016    Review of Glycemic Control:Results for PERLINE, AWE (MRN [id number]) as of 10/16/2016 13:49  Ref. Range 10/15/2016 12:44 10/15/2016 16:45 10/15/2016 20:30 10/16/2016 07:41 10/16/2016 12:06  Glucose-Capillary Latest Ref Range: 65 - 99 mg/dL 228 (H) 193 (H) 247 (H) 137 (H) 225 (H)   Diabetes history: Type 2 diabetes Outpatient Diabetes medications: Humalog 10-15 units tid with meals, Lantus 22 units q HS Current orders for Inpatient glycemic control: Novolog sensitive tid with meals, Lantus 15 units daily at 10 pm  Inpatient Diabetes Program Recommendations:   Please consider adding Novolog meal coverage 3 units tid with meals while patient is in the hospital. Hold if patient eats less than 50%.   Thanks, Adah Perl, RN, BC-ADM Inpatient Diabetes Coordinator Pager [phone] (8a-5p)

## 2016-10-16 NOTE — Progress Notes (Signed)
PROGRESS NOTE    Nicole Strong  NAT:[id number] DOB: 1950-05-19 DOA: 10/11/2016 PCP: Ann Held, DO    Brief Narrative:  66 y.o. 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 Patient states over the last 4-5 days patient has been getting short of breath on exertion. Denies any orthopnea. Denies any chest pain and productive cough fever or chills. Patient has noticed increasing edema over the lower extremity. Patient has not been taking her Lasix due to history of solitary kidney.  Assessment & Plan:   Principal Problem:   Acute on chronic respiratory failure with hypoxia (HCC) Active Problems:   Essential hypertension   Uncontrolled type 2 diabetes mellitus with stage 2 chronic kidney disease, with long-term current use of insulin (HCC)   Axonal GBS (Guillain-Barre syndrome) (HCC)   CHF (congestive heart failure) ([place])   1. Acute diastolic congestive heart failure, chronicity unknown 1. 2-D echo performed, normal LVEF, grade 2 diastolic dysfunction 2. Pt had been continued on BID IV lasix with some improvement in LE edema 3. Cr has peaked to 2.4.  4. Pt to continue 1500cc fluid restricted diet 5. Appreciate Nephro input. Pt had been continued on 80mg  Q8hr lasix IV, since weaned to PO lasix 6. Patient noted to desat to the 80's on ambulation. Discussed with Nephrology, who recommends CT chest to r/o continued pulm edema vs other acute pulm process 2. Acute on chronic renal failure stage III with solitary kidney 1. Had earlier tolerated lasix 2. Continue to hold nephrotoxic agents 3. Pt followed by Dr. Florene Glen prior to admission. Nephrology consulted and recommendations appreciated. Patient improved with 80mg  IV lasix 4. Nephrology had transitioned to PO lasix, however pt remains hypoxic on ambulation and has difficulty speaking in  full sentences 5. CT chest ordered, per Nephrology recommendations. May need additional IV lasix pending results 3. Hypertension 1. Patient continued on PRN hydralazine 2. ACEI presently on hold secondary to renal insufficiency above 3. Remains stable 4. Diabetes mellitus type 2 1. Long-acting insulin was decreased from 24 units to 15 minutes due to worsening creatinine. 2. Cont SSI coverage 3. Remains stable at present 5. Anemia probably secondary to chronic kidney disease 1. Presently stable 6. Abdominal discomfort with poor appetite 1. CT abd reviewed 2. Patient with findings of stool, most notably in the transverse colon, corresponding to location of patient's abd discomfort 3. Improved with BM 7. History of GBS. 1. currently stable  DVT prophylaxis: Heparin subQ Code Status: Full Family Communication: Pt in room, family not at bedside Disposition Plan: Uncertain at this time  Consultants:   Nephrology  Procedures:     Antimicrobials: Anti-infectives    None      Subjective: Still reports some sob, states having some difficulty speaking in full sentences  Objective: Vitals:   10/15/16 2034 10/16/16 0500 10/16/16 0506 10/16/16 0850  BP: 124/67  (!) 122/48 (!) 135/47  Pulse: 71  66 71  Resp: 18  16 17   Temp: 99.2 F (37.3 C)  98 F (36.7 C) 98.4 F (36.9 C)  TempSrc: Oral  Oral Oral  SpO2: 95%  98% 100%  Weight:  100.9 kg (222 lb 7.1 oz)    Height:        Intake/Output Summary (Last 24 hours) at 10/16/16 0943 Last data filed at 10/16/16 0846  Gross per 24 hour  Intake  1464 ml  Output              900 ml  Net              564 ml   Filed Weights   10/14/16 5809 10/15/16 0615 10/16/16 0500  Weight: 105.2 kg (231 lb 14.8 oz) 103.9 kg (229 lb) 100.9 kg (222 lb 7.1 oz)    Examination General exam: Conversant, in no acute distress Respiratory system: normal chest rise, clear, no audible wheezing Cardiovascular system: regular rhythm,  s1-s2 Gastrointestinal system: Nondistended, nontender, pos BS Central nervous system: No seizures, no tremors Extremities: No cyanosis, no joint deformities Skin: No rashes, no pallor Psychiatry: Affect normal // no auditory hallucinations   Data Reviewed: I have personally reviewed following labs and imaging studies  CBC:  Recent Labs Lab 10/11/16 0148 10/11/16 0431  WBC 6.5 8.2  NEUTROABS 4.4  --   HGB 11.3* 13.1  HCT 33.5* 39.2  MCV 88.9 89.1  PLT 211 983   Basic Metabolic Panel:  Recent Labs Lab 10/11/16 0431 10/12/16 0431 10/13/16 0515 10/14/16 0435 10/15/16 0524 10/16/16 0402  NA  --  144 141 141 139 137  K  --  3.9 3.8 3.7 3.9 4.0  CL  --  105 103 100* 96* 95*  CO2  --  33* 32 34* 36* 36*  GLUCOSE  --  128* 115* 131* 156* 184*  BUN  --  25* 26* 27* 34* 36*  CREATININE 2.05* 2.17* 2.42* 2.27* 2.51* 2.58*  CALCIUM  --  8.6* 8.3* 8.7* 8.5* 8.4*  MG 2.0  --   --   --   --   --    GFR: Estimated Creatinine Clearance: 22.7 mL/min (A) (by C-G formula based on SCr of 2.58 mg/dL (H)). Liver Function Tests: No results for input(s): AST, ALT, ALKPHOS, BILITOT, PROT, ALBUMIN in the last 168 hours. No results for input(s): LIPASE, AMYLASE in the last 168 hours. No results for input(s): AMMONIA in the last 168 hours. Coagulation Profile: No results for input(s): INR, PROTIME in the last 168 hours. Cardiac Enzymes:  Recent Labs Lab 10/11/16 0431 10/11/16 1014 10/11/16 1628  TROPONINI <0.03 <0.03 <0.03   BNP (last 3 results) No results for input(s): PROBNP in the last 8760 hours. HbA1C: No results for input(s): HGBA1C in the last 72 hours. CBG:  Recent Labs Lab 10/15/16 0748 10/15/16 1244 10/15/16 1645 10/15/16 2030 10/16/16 0741  GLUCAP 165* 228* 193* 247* 137*   Lipid Profile: No results for input(s): CHOL, HDL, LDLCALC, TRIG, CHOLHDL, LDLDIRECT in the last 72 hours. Thyroid Function Tests: No results for input(s): TSH, T4TOTAL, FREET4, T3FREE,  THYROIDAB in the last 72 hours. Anemia Panel: No results for input(s): VITAMINB12, FOLATE, FERRITIN, TIBC, IRON, RETICCTPCT in the last 72 hours. Sepsis Labs: No results for input(s): PROCALCITON, LATICACIDVEN in the last 168 hours.  No results found for this or any previous visit (from the past 240 hour(s)).   Radiology Studies: No results found.  Scheduled Meds: . amitriptyline  40 mg Oral QHS  . atorvastatin  10 mg Oral Once per day on Mon Wed Fri  . bacitracin-polymyxin b   Both Eyes Q4H  . docusate sodium  100 mg Oral Daily  . furosemide  40 mg Oral BID  . gabapentin  300 mg Oral Q breakfast  . gabapentin  300 mg Oral QAC supper  . gabapentin  600 mg Oral Q lunch  . gabapentin  600 mg Oral QHS  .  heparin  5,000 Units Subcutaneous Q8H  . hydrALAZINE  25 mg Oral Q8H  . insulin aspart  0-9 Units Subcutaneous TID WC  . insulin glargine  15 Units Subcutaneous Q2200  . ketotifen  1 drop Both Eyes BID  . polyethylene glycol  17 g Oral Daily  . traMADol  100 mg Oral QPM  . traMADol  50 mg Oral Daily   Continuous Infusions:   LOS: 5 days   CHIU, Orpah Melter, MD [facility] Pager [phone]  If 7PM-7AM, please contact night-coverage [email or web address] Password [facility] 10/16/2016, 9:43 AM

## 2016-10-17 DIAGNOSIS — N179 Acute kidney failure, unspecified: Secondary | ICD-10-CM

## 2016-10-17 DIAGNOSIS — N183 Chronic kidney disease, stage 3 (moderate): Secondary | ICD-10-CM

## 2016-10-17 DIAGNOSIS — I5033 Acute on chronic diastolic (congestive) heart failure: Secondary | ICD-10-CM

## 2016-10-17 LAB — BASIC METABOLIC PANEL
Anion gap: 5 (ref 5–15)
BUN: 40 mg/dL — ABNORMAL HIGH (ref 6–20)
CHLORIDE: 97 mmol/L — AB (ref 101–111)
CO2: 36 mmol/L — ABNORMAL HIGH (ref 22–32)
CREATININE: 2.81 mg/dL — AB (ref 0.44–1.00)
Calcium: 8.5 mg/dL — ABNORMAL LOW (ref 8.9–10.3)
GFR, EST AFRICAN AMERICAN: 19 mL/min — AB (ref >60)
GFR, EST NON AFRICAN AMERICAN: 16 mL/min — AB (ref >60)
Glucose, Bld: 170 mg/dL — ABNORMAL HIGH (ref 65–99)
POTASSIUM: 4.2 mmol/L (ref 3.5–5.1)
SODIUM: 138 mmol/L (ref 135–145)

## 2016-10-17 LAB — GLUCOSE, CAPILLARY
GLUCOSE-CAPILLARY: 232 mg/dL — AB (ref 65–99)
Glucose-Capillary: 134 mg/dL — ABNORMAL HIGH (ref 65–99)
Glucose-Capillary: 183 mg/dL — ABNORMAL HIGH (ref 65–99)
Glucose-Capillary: 149 mg/dL — ABNORMAL HIGH (ref 65–99)

## 2016-10-17 MED ORDER — FUROSEMIDE 40 MG PO TABS
40.0000 mg | ORAL_TABLET | Freq: Every day | ORAL | Status: DC
Start: 2016-10-18 — End: 2016-10-18
  Administered 2016-10-18: 40 mg via ORAL
  Filled 2016-10-17: qty 1

## 2016-10-17 NOTE — Progress Notes (Signed)
Occupational Therapy Treatment Patient Details Name: SHAINE MOUNT MRN: [id number] DOB: [birth year] Today's Date: 10/17/2016    History of present illness This 66 year old female was admitted for acute or chronic respiratory failure. 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  Pt needed max encouragement to get OOB and get to chair. Pt had not been in chair all day.   Follow Up Recommendations  No OT follow up    Equipment Recommendations  None recommended by OT       Precautions / Restrictions Precautions Precautions: Fall Precaution Comments: monitor O2 sats       Mobility Bed Mobility Overal bed mobility: Needs Assistance Bed Mobility: Supine to Sit     Supine to sit: Supervision     General bed mobility comments: slight assist for LEs onto bed per pt request  Transfers Overall transfer level: Needs assistance Equipment used: Rolling walker (2 wheeled) Transfers: Sit to/from Stand Sit to Stand: Supervision Stand pivot transfers: Supervision       General transfer comment: verbal cues for hand placement    Balance Overall balance assessment: Needs assistance Sitting-balance support: No upper extremity supported;Feet supported Sitting balance-Leahy Scale: Good     Standing balance support: No upper extremity supported Standing balance-Leahy Scale: Fair                             ADL either performed or assessed with clinical judgement   ADL Overall ADL's : Needs assistance/impaired                     Lower Body Dressing: Cueing for safety;With adaptive equipment;Supervision/safety Lower Body Dressing Details (indicate cue type and reason): pt overall S with sock aid- states she will obtain Toilet Transfer: Supervision/safety;RW;BSC   Toileting- Clothing Manipulation and Hygiene: Sit to/from stand;Cueing  for safety;Cueing for sequencing;Supervision/safety               Vision Patient Visual Report: No change from baseline            Cognition Arousal/Alertness: Awake/alert Behavior During Therapy: WFL for tasks assessed/performed Overall Cognitive Status: Within Functional Limits for tasks assessed                                                     Pertinent Vitals/ Pain       Pain Location: back (chronic) Pain Descriptors / Indicators: Discomfort         Frequency  Min 2X/week        Progress Toward Goals  OT Goals(current goals can now be found in the care plan section)        Plan Discharge plan remains appropriate       AM-PAC PT "6 Clicks" Daily Activity     Outcome Measure   Help from another person eating meals?: None Help from another person taking care of personal grooming?: A Little Help from another person toileting, which includes using toliet, bedpan, or urinal?: A Little Help from another person bathing (including washing, rinsing, drying)?: A Little Help from another person to put on and taking off regular upper body clothing?: A  Little Help from another person to put on and taking off regular lower body clothing?: A Little 6 Click Score: 19    End of Session Equipment Utilized During Treatment: Rolling walker  OT Visit Diagnosis: Unsteadiness on feet (R26.81)   Activity Tolerance Patient tolerated treatment well   Patient Left in bed;with call bell/phone within reach;with bed alarm set   Nurse Communication Mobility status        Time: 4461-9012 OT Time Calculation (min): 25 min  Charges: OT General Charges $OT Visit: 1 Visit OT Treatments $Self Care/Home Management : 23-37 mins  [place], [place]   Payton Mccallum D 10/17/2016, 4:04 PM

## 2016-10-17 NOTE — Progress Notes (Signed)
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 Patient states over the last 4-5 days patient has been getting short of breath on exertion. Denies any orthopnea.Denies any chest pain and productive cough fever or chills. Patient has noticed increasing edema over the lower extremity. Patient has not been taking her Lasix due to history of solitary kidney.  Assessment & Plan: Principal Problem:   Acute on chronic respiratory failure with hypoxia (HCC) Active Problems:   Essential hypertension   Uncontrolled type 2 diabetes mellitus with stage 2 chronic kidney disease, with long-term current use of insulin (HCC)   Axonal GBS (Guillain-Barre syndrome) (HCC)   CHF (congestive heart failure) (HCC)    Acute on chronic diastolic CHF -2D echo shows normal ejection fraction, grade 2 diastolic dysfunction -She was placed on IV diuresis, net -7.4 L.  Her diuretics were changed to p.o. -daily weights  Acute hypoxic respiratory failure -Suspect chronic component due to obesity hypoventilation as well as probable undiagnosed obstructive sleep apnea. Given persistent hypoxia, she underwent a CT scan of the chest on 9/4 which did not show any pulmonary edema -May need oxygen on discharge, to be determined tomorrow  Acute kidney injury on CKD stage III with solitary kidney -Creatinine increasing to date 2.8, discussed with nephrology Dr. Jonnie Finner, will decrease to Lasix to once daily -She normally follows up with Dr. Florene Glen  Hypertension -Continue hydralazine  Diabetes mellitus type 2 -Continue long-acting insulin, sliding scale, fasting CBG 134 this morning  Anemia probably secondary to  chronic kidney disease - stable  History of GBS -Stable    DVT prophylaxis: Heparin Code Status: Full code Family Communication: No family at bedside Disposition Plan: Home when ready, 1-2 days  Consultants:   Nephrology  Procedures:   2D echo:  Study Conclusions - Left ventricle: The cavity size was normal. There was mild concentric hypertrophy. Systolic function was normal. The estimated ejection fraction was in the range of 55% to 60%. Wall motion was normal; there were no regional wall motion abnormalities. Features are consistent with a pseudonormal left ventricular filling pattern, with concomitant abnormal relaxation and increased filling pressure (grade 2 diastolic dysfunction). Doppler parameters are consistent with high ventricular filling pressure. - Aortic valve: Transvalvular velocity was within the normal range. There was no stenosis. There was no regurgitation. Valve area (VTI): 1.55 cm^2. Valve area (Vmax): 1.66 cm^2. Valve area (Vmean): 1.64 cm^2. - Mitral valve: Mildly calcified annulus. Transvalvular velocity was within the normal range. There was no evidence for stenosis. There was trivial regurgitation. - Right ventricle: The cavity size was normal. Wall thickness was normal. Systolic function was normal. - Atrial septum: No defect or patent foramen ovale was identified by color flow Doppler. - Tricuspid valve: There was trivial regurgitation. - Pulmonary arteries: Systolic pressure was within the normal range. PA peak pressure: 15 mm Hg (S).  Antimicrobials:  None    Subjective: - no chest pain, no abdominal pain, nausea or vomiting. Still dyspneic however feels improved  Objective: Vitals:   10/16/16 1427 10/16/16 1454 10/16/16 2040 10/17/16 0444  BP: (!) 144/47 (!) 152/48 (!) 149/49 (!) 131/51  Pulse: 70  73 71  Resp: 18  16   Temp: 98.5 F (36.9 C)  98.3 F (36.8 C) 98 F (36.7 C)  TempSrc: Oral  Oral Oral  SpO2: 100%  95% 97%  Weight:    91 kg  (200 lb 9.9 oz)  Height:        Intake/Output Summary (Last 24 hours) at 10/17/16 1112 Last data filed at 10/17/16 1000  Gross per 24 hour  Intake              360 ml  Output             1900 ml  Net            -1540 ml   Filed Weights   10/15/16 0615 10/16/16 0500 10/17/16 0444  Weight: 103.9 kg (229 lb) 100.9 kg (222 lb 7.1 oz) 91 kg (200 lb 9.9 oz)    Examination:  Vitals:   10/16/16 1427 10/16/16 1454 10/16/16 2040 10/17/16 0444  BP: (!) 144/47 (!) 152/48 (!) 149/49 (!) 131/51  Pulse: 70  73 71  Resp: 18  16   Temp: 98.5 F (36.9 C)  98.3 F (36.8 C) 98 F (36.7 C)  TempSrc: Oral  Oral Oral  SpO2: 100%  95% 97%  Weight:    91 kg (200 lb 9.9 oz)  Height:        Constitutional: NAD Eyes: lids and conjunctivae normal ENMT: Mucous membranes are moist.  Respiratory: clear to auscultation bilaterally, no wheezing, no crackles. Normal respiratory effort.  Cardiovascular: Regular rate and rhythm, no murmurs / rubs / gallops. Trace LE edema.  Abdomen: no tenderness. Bowel sounds positive.  Neurologic: CN 2-12 grossly intact. Strength 5/5 in all 4.  Psychiatric: Normal judgment and insight. Alert and oriented x 3. Normal mood.    Data Reviewed: I have independently reviewed following labs and imaging studies  CBC:  Recent Labs Lab 10/11/16 0148 10/11/16 0431  WBC 6.5 8.2  NEUTROABS 4.4  --   HGB 11.3* 13.1  HCT 33.5* 39.2  MCV 88.9 89.1  PLT 211 161   Basic Metabolic Panel:  Recent Labs Lab 10/11/16 0431  10/13/16 0515 10/14/16 0435 10/15/16 0524 10/16/16 0402 10/17/16 0423  NA  --   < > 141 141 139 137 138  K  --   < > 3.8 3.7 3.9 4.0 4.2  CL  --   < > 103 100* 96* 95* 97*  CO2  --   < > 32 34* 36* 36* 36*  GLUCOSE  --   < > 115* 131* 156* 184* 170*  BUN  --   < > 26* 27* 34* 36* 40*  CREATININE 2.05*  < > 2.42* 2.27* 2.51* 2.58* 2.81*  CALCIUM  --   < > 8.3* 8.7* 8.5* 8.4* 8.5*  MG 2.0  --   --   --   --   --   --   < > = values in this  interval not displayed. GFR: Estimated Creatinine Clearance: 19.7 mL/min (A) (by C-G formula based on SCr of 2.81 mg/dL (H)). Liver Function Tests: No results for input(s): AST, ALT, ALKPHOS, BILITOT, PROT, ALBUMIN in the last 168 hours. No results for input(s): LIPASE, AMYLASE in the last 168 hours. No results for input(s): AMMONIA in the last 168 hours. Coagulation Profile: No results for input(s): INR, PROTIME in the last 168 hours. Cardiac Enzymes:  Recent Labs Lab 10/11/16 0431 10/11/16 1014 10/11/16 1628  TROPONINI <0.03 <0.03 <0.03   BNP (last 3 results) No results for input(s): PROBNP in the last  8760 hours. HbA1C: No results for input(s): HGBA1C in the last 72 hours. CBG:  Recent Labs Lab 10/16/16 0741 10/16/16 1206 10/16/16 1641 10/16/16 2136 10/17/16 0739  GLUCAP 137* 225* 221* 220* 134*   Lipid Profile: No results for input(s): CHOL, HDL, LDLCALC, TRIG, CHOLHDL, LDLDIRECT in the last 72 hours. Thyroid Function Tests: No results for input(s): TSH, T4TOTAL, FREET4, T3FREE, THYROIDAB in the last 72 hours. Anemia Panel: No results for input(s): VITAMINB12, FOLATE, FERRITIN, TIBC, IRON, RETICCTPCT in the last 72 hours. Urine analysis:    Component Value Date/Time   COLORURINE YELLOW 10/13/2016 [place] 10/13/2016 1747   LABSPEC 1.013 10/13/2016 1747   PHURINE 7.0 10/13/2016 1747   GLUCOSEU >=500 (A) 10/13/2016 1747   HGBUR NEGATIVE 10/13/2016 1747   BILIRUBINUR NEGATIVE 10/13/2016 1747   BILIRUBINUR neg 12/31/2013 1458   KETONESUR NEGATIVE 10/13/2016 1747   PROTEINUR >=300 (A) 10/13/2016 1747   UROBILINOGEN 0.2 04/07/2014 0117   NITRITE NEGATIVE 10/13/2016 1747   LEUKOCYTESUR NEGATIVE 10/13/2016 1747   Sepsis Labs: Invalid input(s): PROCALCITONIN, LACTICIDVEN  No results found for this or any previous visit (from the past 240 hour(s)).    Radiology Studies: Ct Chest Wo Contrast  Result Date: 10/16/2016 CLINICAL DATA:  Increasing  shortness of breath. The the history of single kidney and renal cell carcinoma. The EXAM: CT CHEST WITHOUT CONTRAST TECHNIQUE: Multidetector CT imaging of the chest was performed following the standard protocol without IV contrast. COMPARISON:  10/10/2016 chest radiograph. FINDINGS: Cardiovascular: Normal heart size. No pericardial effusion. Dense mitral annular calcification. No appreciable coronary artery calcification. Normal caliber thoracic aorta with mild calcific atherosclerosis. Mediastinum/Nodes: No enlarged mediastinal or axillary lymph nodes. Thyroid gland, trachea, and esophagus demonstrate no significant findings. Lungs/Pleura: Scattered pulmonary nodules measuring up to 9 mm in the right upper lobe (series 5, image 41). Small bilateral pleural effusions. No consolidation, pneumothorax, or findings of pulmonary edema. Upper Abdomen: 20 mm left and 15 mm right adrenal nodules measuring -10 HU compatible with adenoma. Musculoskeletal: No fracture is seen. Chronic postsurgical changes within the right upper quadrant abdominal wall. IMPRESSION: 1. Small bilateral pleural effusions. 2. No consolidation, pneumothorax, or findings of pulmonary edema. 3. Pulmonary nodules measuring up to 9 mm in the right upper lobe. Given history of prior malignancy, short interval follow-up is recommended to ensure stability. 4. Bilateral adrenal adenoma. Electronically Signed   By: Kristine Garbe M.D.   On: 10/16/2016 23:16     Scheduled Meds: . amitriptyline  40 mg Oral QHS  . atorvastatin  10 mg Oral Once per day on Mon Wed Fri  . bacitracin-polymyxin b   Both Eyes Q4H  . docusate sodium  100 mg Oral Daily  . [START ON 10/18/2016] furosemide  40 mg Oral Daily  . gabapentin  300 mg Oral Q breakfast  . gabapentin  300 mg Oral QAC supper  . gabapentin  600 mg Oral Q lunch  . gabapentin  600 mg Oral QHS  . heparin  5,000 Units Subcutaneous Q8H  . hydrALAZINE  25 mg Oral Q8H  . insulin aspart  0-9 Units  Subcutaneous TID WC  . insulin aspart  3 Units Subcutaneous TID WC  . insulin glargine  15 Units Subcutaneous Q2200  . ketotifen  1 drop Both Eyes BID  . polyethylene glycol  17 g Oral Daily  . traMADol  100 mg Oral QPM  . traMADol  50 mg Oral Daily   Continuous Infusions:   Marzetta Board, MD,  PhD [facility] Pager [phone]  If 7PM-7AM, please contact night-coverage [email or web address] Password [email or web address] 10/17/2016, 12:17 PM

## 2016-10-17 NOTE — Care Management Important Message (Signed)
Important Message  Patient Details  Name: Nicole Strong MRN: [id number] Date of Birth: 07-Mar-1950   Medicare Important Message Given:  Yes    Kerin Salen 10/17/2016, 10:26 AMImportant Message  Patient Details  Name: Nicole Strong MRN: [id number] Date of Birth: 1950/03/09   Medicare Important Message Given:  Yes    Kerin Salen 10/17/2016, 10:25 AM

## 2016-10-18 DIAGNOSIS — N184 Chronic kidney disease, stage 4 (severe): Secondary | ICD-10-CM

## 2016-10-18 LAB — BASIC METABOLIC PANEL
ANION GAP: 8 (ref 5–15)
BUN: 42 mg/dL — ABNORMAL HIGH (ref 6–20)
CALCIUM: 8.7 mg/dL — AB (ref 8.9–10.3)
CHLORIDE: 96 mmol/L — AB (ref 101–111)
CO2: 35 mmol/L — AB (ref 22–32)
Creatinine, Ser: 2.74 mg/dL — ABNORMAL HIGH (ref 0.44–1.00)
GFR calc non Af Amer: 17 mL/min — ABNORMAL LOW (ref >60)
GFR, EST AFRICAN AMERICAN: 19 mL/min — AB (ref >60)
Glucose, Bld: 148 mg/dL — ABNORMAL HIGH (ref 65–99)
POTASSIUM: 4.2 mmol/L (ref 3.5–5.1)
Sodium: 139 mmol/L (ref 135–145)

## 2016-10-18 LAB — GLUCOSE, CAPILLARY
GLUCOSE-CAPILLARY: 233 mg/dL — AB (ref 65–99)
Glucose-Capillary: 122 mg/dL — ABNORMAL HIGH (ref 65–99)

## 2016-10-18 MED ORDER — HYDRALAZINE HCL 25 MG PO TABS: 25.0000 mg | ORAL_TABLET | Freq: Three times a day (TID) | ORAL | 1 refills | Status: AC

## 2016-10-18 MED ORDER — FUROSEMIDE 40 MG PO TABS
40.0000 mg | ORAL_TABLET | Freq: Every day | ORAL | 0 refills | Status: DC
Start: 2016-10-18 — End: 2016-10-25

## 2016-10-18 NOTE — Progress Notes (Signed)
Physical Therapy Treatment Patient Details Name: Nicole Strong MRN: [id number] DOB: [birth year] Today's Date: 10/18/2016    History of Present Illness This 66 year old female was admitted for acute or chronic respiratory failure. She has a PMH significant for chronic renal failure, L nephrectomy due to renal CA, DM, and s/p Guillian Barre Syndrome    PT Comments    Pt ambulated in hallway and educated on use of rollator.  Pt likely to d/c home today.  [employer] rep bringing home oxygen in room and educating pt upon PT departure.  Follow Up Recommendations  No PT follow up     Equipment Recommendations  Other (comment) (4 wheeled RW with seat)    Recommendations for Other Services       Precautions / Restrictions Precautions Precautions: Fall Precaution Comments: monitor O2 sats Restrictions Weight Bearing Restrictions: No    Mobility  Bed Mobility Overal bed mobility: Needs Assistance Bed Mobility: Supine to Sit     Supine to sit: Supervision        Transfers Overall transfer level: Needs assistance Equipment used: 4-wheeled walker Transfers: Sit to/from Stand Sit to Stand: Supervision Stand pivot transfers: Supervision       General transfer comment: verbal cues for hand placement and using brakes on rollator  Ambulation/Gait Ambulation/Gait assistance: Min guard Ambulation Distance (Feet): 60 Feet (x2) Assistive device: Rolling walker (2 wheeled) Gait Pattern/deviations: Step-through pattern;Decreased stride length Gait velocity: decreased   General Gait Details: seated rest break required due to SOB and fatigue, used rollator with cues for brakes, SpO2 91% on room air during ambulation   Stairs            Wheelchair Mobility    Modified Rankin (Stroke Patients Only)       Balance                                            Cognition Arousal/Alertness: Awake/alert Behavior During Therapy: WFL for tasks  assessed/performed Overall Cognitive Status: Within Functional Limits for tasks assessed                                        Exercises      General Comments        Pertinent Vitals/Pain Pain Assessment: 0-10 Pain Score: 6  Pain Location: R low back (pt thinks it's kidney related) Pain Descriptors / Indicators: Discomfort Pain Intervention(s): Monitored during session;Limited activity within patient's tolerance (RN aware)    Home Living                      Prior Function            PT Goals (current goals can now be found in the care plan section) Progress towards PT goals: Progressing toward goals    Frequency    Min 3X/week      PT Plan Current plan remains appropriate    Co-evaluation              AM-PAC PT "6 Clicks" Daily Activity  Outcome Measure  Difficulty turning over in bed (including adjusting bedclothes, sheets and blankets)?: A Little Difficulty moving from lying on back to sitting on the side of the bed? : A Little Difficulty sitting down on and standing  up from a chair with arms (e.g., wheelchair, bedside commode, etc,.)?: A Little Help needed moving to and from a bed to chair (including a wheelchair)?: A Little Help needed walking in hospital room?: A Little Help needed climbing 3-5 steps with a railing? : A Little 6 Click Score: 18    End of Session Equipment Utilized During Treatment: Oxygen Activity Tolerance: Patient tolerated treatment well Patient left: in bed;with call bell/phone within reach (sitting EOB with [employer] bringing home O2) Nurse Communication: Mobility status PT Visit Diagnosis: Difficulty in walking, not elsewhere classified (R26.2);Muscle weakness (generalized) (M62.81)     Time: 5681-2751 PT Time Calculation (min) (ACUTE ONLY): 18 min  Charges:  $Gait Training: 8-22 mins                    G Codes:       Carmelia Bake, PT, DPT 10/18/2016 Pager: [phone]  York Ram  E 10/18/2016, 1:24 PM

## 2016-10-18 NOTE — Progress Notes (Signed)
Pt's home O2 from [place].

## 2016-10-18 NOTE — Discharge Summary (Addendum)
Physician Discharge Summary  KEYSHA Strong OIB:[id number] DOB: 1950-10-04 DOA: 10/11/2016  PCP: Ann Held, DO  Admit date: 10/11/2016 Discharge date: 10/18/2016  Admitted From: home Disposition:  home  Recommendations for Outpatient Follow-up:  1. Follow up with PCP in 1-2 weeks 2. Follow up with Dr. Florene Strong in 1-2 weeks  Home Health: none Equipment/Devices: 1L [place] oxygen  Discharge Condition: stable CODE STATUS: Full code Diet recommendation: low salt, renal   HPI: Per Dr. Glyn Ade, Nicole Strong is a 66 y.o. 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 Patient states over the last 4-5 days patient has been getting short of breath on exertion. Denies any orthopnea. Denies any chest pain and productive cough fever or chills. Patient has noticed increasing edema over the lower extremity. Patient has not been taking her Lasix due to history of solitary kidney. ED Course: In the ER patient was found to be adenomatous with lower extremity edema and chest x-ray shows congestion and pleural effusion. Patient was given Lasix 40 mg IV in the ER and admitted for CHF exacerbation.  Hospital Course: Discharge Diagnoses:  Principal Problem:   Acute on chronic respiratory failure with hypoxia (HCC) Active Problems:   Essential hypertension   Uncontrolled type 2 diabetes mellitus with stage 2 chronic kidney disease, with long-term current use of insulin (HCC)   Axonal GBS (Guillain-Barre syndrome) (HCC)   CHF (congestive heart failure) (HCC)   Acute on chronic diastolic CHF -patient was admitted to the hospital with fluid overload in the setting of acute on chronic diastolic CHF.  She underwent a 2D echo which showed normal EF and grade 2 diastolic dysfunction.  She was kept on IV diuresis being negative close to 8 L, with significant  improvement in her respiratory status.  She was euvolemic on the day of discharge, her Lasix was transitioned to p.o. and was discharged home in stable condition.  She was extensively counseled for daily weights as well as a low-salt diet, along with weight loss Acute hypoxic respiratory failure -Suspect chronic component due to obesity hypoventilation as well as probable undiagnosed obstructive sleep apnea. Given persistent hypoxia, she underwent a CT scan of the chest on 9/4 which did not show any pulmonary edema or other process that could explain her hypoxia.  She did require 1 L nasal cannula on discharge. Recommended weight loss. Recommend outpatient sleep study Acute kidney injury on CKD stage III with solitary kidney -she tolerated IV diuresis fairly well, her creatinine increased up to 2.8, and at that point had no further evidence of fluid overload and her diuretics were changed to p.o. per nephrology recommendations.  Creatinine slowly improving on the day of discharge, she will need to continue on her Lasix, and have recommended follow-up with Dr. Florene Strong in few weeks. Hypertension -Continue hydralazine, her ACE inhibitor has been discontinued Diabetes mellitus type 2 -Continue home regimen, outpatient follow-up Anemia probably secondary to chronic kidney disease - stable History of GBS - Stable, follows up with neurology as an outpatient   Discharge Instructions   Allergies as of 10/18/2016      Reactions   Biaxin [clarithromycin] Other (See Comments)   "face turns red"   Ciprofloxacin Other (See Comments)   Had Guillene-Barre sd.   Codeine Nausea And Vomiting, Other (See Comments)   Hallucinations   Erythromycin Nausea And Vomiting   Influenza Vaccines  Had Guillene-Barre sd.   Januvia [sitagliptin]    Prednisone    Mania.   Augmentin [amoxicillin-pot Clavulanate] Diarrhea      Medication List    STOP taking these medications   diclofenac 50 MG EC tablet Commonly known  as:  VOLTAREN   lisinopril 20 MG tablet Commonly known as:  PRINIVIL,ZESTRIL   meclizine 25 MG tablet Commonly known as:  ANTIVERT     TAKE these medications   albuterol 108 (90 Base) MCG/ACT inhaler Commonly known as:  VENTOLIN HFA Inhale 2 puffs into the lungs every 6 (six) hours as needed for wheezing or shortness of breath.   amitriptyline 10 MG tablet Commonly known as:  ELAVIL TAKE 4 TABLETS (40 MG TOTAL) BY MOUTH AT BEDTIME.   atorvastatin 10 MG tablet Commonly known as:  LIPITOR Take 1 tablet by mouth 3 times a week.   azelastine 0.1 % nasal spray Commonly known as:  ASTELIN Place 2 sprays into both nostrils 2 (two) times daily. Use in each nostril as directed What changed:  additional instructions   BD PEN NEEDLE NANO U/F 32G X 4 MM Misc Generic drug:  Insulin Pen Needle USE 1X A DAY   COD LIVER OIL PO Take 1 capsule by mouth every evening.   docusate sodium 100 MG capsule Commonly known as:  COLACE Take 1 capsule (100 mg total) by mouth daily.   furosemide 40 MG tablet Commonly known as:  LASIX Take 1 tablet (40 mg total) by mouth daily. What changed:  when to take this  reasons to take this   gabapentin 300 MG capsule Commonly known as:  NEURONTIN TAKE ONE CAPSULE BY MOUTH EVERY MORNING, 2 CAPSULES AT LUNCH AND 1 CAPSULE IN THE AFTERNOON AND 2 EVERY NIGHT AT BEDTIME   glucose blood test strip Commonly known as:  ONETOUCH VERIO Use as instructed to check sugar 2 times daily   Insulin Glargine 100 UNIT/ML Solostar Pen Commonly known as:  LANTUS SOLOSTAR Inject 15 Units into the skin daily at 10 pm. What changed:  how much to take   insulin lispro 100 UNIT/ML KiwkPen Commonly known as:  HUMALOG KWIKPEN Inject 10-15 units three times daily with meals.   HUMALOG KWIKPEN 100 UNIT/ML KiwkPen Generic drug:  insulin lispro INJECT 10-15 UNITS THREE TIMES A DAY WITH MEALS   ipratropium 17 MCG/ACT inhaler Commonly known as:  ATROVENT HFA Inhale 2  puffs into the lungs every 6 (six) hours as needed for wheezing.   onetouch ultrasoft lancets Use as instructed to check sugar 2 times daily.   ONETOUCH VERIO IQ SYSTEM w/Device Kit Used to check sugar 2 times daily.   oxyCODONE-acetaminophen 5-325 MG tablet Commonly known as:  PERCOCET/ROXICET Take 1 tablet by mouth every 4 (four) hours as needed.   promethazine 25 MG tablet Commonly known as:  PHENERGAN Take 1 tablet (25 mg total) by mouth every 8 (eight) hours as needed for nausea or vomiting.   traMADol 50 MG tablet Commonly known as:  ULTRAM TAKE 1 EVERY MORNING AND 2 EVERY EVENING   Vitamin D3 50000 units Caps Take 50,000 Units by mouth once a week.            Durable Medical Equipment        Start     Ordered   10/16/16 1549  For home use only DME oxygen  Once    Question Answer Comment  Mode or (Route) Nasal cannula   Liters per Minute 1  Frequency Continuous (stationary and portable oxygen unit needed)   Oxygen conserving device No   Oxygen delivery system Gas      10/16/16 1548   10/15/16 1232  For home use only DME 4 wheeled rolling walker with seat  Once    Question:  Patient needs a walker to treat with the following condition  Answer:  Balance disorder   10/15/16 1232       Discharge Care Instructions        Start     Ordered   10/18/16 0000  furosemide (LASIX) 40 MG tablet  Daily     10/18/16 0849   10/17/16 0000  docusate sodium (COLACE) 100 MG capsule  Daily     10/16/16 1554   10/16/16 0000  Insulin Glargine (LANTUS SOLOSTAR) 100 UNIT/ML Solostar Pen  Daily at 10 pm     10/16/16 1554     Follow-up Information    Estanislado Emms, MD. Schedule an appointment as soon as possible for a visit in 2 week(s).   Specialty:  Nephrology Contact information: [address] [place] [address] Atkins, [place], DO. Schedule an appointment as soon as possible for a visit in 2 week(s).     Specialty:  Family Medicine Contact information: [address] [place] [address] [phone]          Allergies  Allergen Reactions  . Biaxin [Clarithromycin] Other (See Comments)    "face turns red"  . Ciprofloxacin Other (See Comments)    Had Guillene-Barre sd.  . Codeine Nausea And Vomiting and Other (See Comments)    Hallucinations  . Erythromycin Nausea And Vomiting  . Influenza Vaccines     Had Guillene-Barre sd.  Celesta Gentile [Sitagliptin]   . Prednisone     Mania.  . Augmentin [Amoxicillin-Pot Clavulanate] Diarrhea    Consultations:  Nephrology  Procedures/Studies:  2D echo  Study Conclusions - Left ventricle: The cavity size was normal. There was mild concentric hypertrophy. Systolic function was normal. The estimated ejection fraction was in the range of 55% to 60%. Wall motion was normal; there were no regional wall motion abnormalities. Features are consistent with a pseudonormal left ventricular filling pattern, with concomitant abnormal relaxation and increased filling pressure (grade 2 diastolic dysfunction). Doppler parameters are consistent with high ventricular filling pressure. - Aortic valve: Transvalvular velocity was within the normal range. There was no stenosis. There was no regurgitation. Valve area (VTI): 1.55 cm^2. Valve area (Vmax): 1.66 cm^2. Valve area (Vmean): 1.64 cm^2. - Mitral valve: Mildly calcified annulus. Transvalvular velocity was within the normal range. There was no evidence for stenosis. There was trivial regurgitation. - Right ventricle: The cavity size was normal. Wall thickness was normal. Systolic function was normal. - Atrial septum: No defect or patent foramen ovale was identified by color flow Doppler. - Tricuspid valve: There was trivial regurgitation. - Pulmonary arteries: Systolic pressure was within the normal range. PA peak pressure: 15 mm Hg (S).  Ct Abdomen Pelvis Wo Contrast  Result Date:  10/11/2016 CLINICAL DATA:  Flank pain, question stones EXAM: CT ABDOMEN AND PELVIS WITHOUT CONTRAST TECHNIQUE: Multidetector CT imaging of the abdomen and pelvis was performed following the standard protocol without IV contrast. COMPARISON:  CT 12/11/2013 FINDINGS: Lower chest: Small bilateral pleural  effusions. Heart is normal size. Bibasilar atelectasis. Mitral valve annular calcifications noted. Hepatobiliary: No focal liver abnormality is seen. Status post cholecystectomy. No biliary dilatation. Pancreas: No focal abnormality or ductal dilatation. Spleen: No focal abnormality.  Normal size. Adrenals/Urinary Tract: Prior left nephrectomy. No renal or ureteral stones on the right. No hydronephrosis. 16 mm low-density nodule in the right adrenal gland compatible with adenoma. Small adenoma also in the left adrenal gland. Urinary bladder unremarkable. Stomach/Bowel: Normal appendix. Stomach, large and small bowel grossly unremarkable. Vascular/Lymphatic: No evidence of aneurysm or adenopathy. Aortic and iliac calcifications. Reproductive: Uterus and adnexa unremarkable.  No mass. Other: No free fluid or free air. Musculoskeletal: No acute bony abnormality. IMPRESSION: Prior left nephrectomy.  No right ureteral stones or hydronephrosis. Small bilateral adrenal adenomas. Small bilateral pleural effusions.  Bibasilar atelectasis. Electronically Signed   By: Rolm Baptise M.D.   On: 10/11/2016 10:08   Dg Chest 2 View  Result Date: 10/10/2016 CLINICAL DATA:  Shortness of breath EXAM: CHEST  2 VIEW COMPARISON:  01/27/2015 FINDINGS: Low lung volumes. Tiny left greater than right pleural effusions. Hazy atelectasis or infiltrate at the left base. Borderline cardiomegaly with aortic atherosclerosis. No pneumothorax. IMPRESSION: 1. Small left greater than right pleural effusions. Hazy atelectasis or infiltrate at the left base 2. Borderline cardiomegaly Electronically Signed   By: Donavan Foil M.D.   On: 10/10/2016 18:00     Ct Chest Wo Contrast  Result Date: 10/16/2016 CLINICAL DATA:  Increasing shortness of breath. The the history of single kidney and renal cell carcinoma. The EXAM: CT CHEST WITHOUT CONTRAST TECHNIQUE: Multidetector CT imaging of the chest was performed following the standard protocol without IV contrast. COMPARISON:  10/10/2016 chest radiograph. FINDINGS: Cardiovascular: Normal heart size. No pericardial effusion. Dense mitral annular calcification. No appreciable coronary artery calcification. Normal caliber thoracic aorta with mild calcific atherosclerosis. Mediastinum/Nodes: No enlarged mediastinal or axillary lymph nodes. Thyroid gland, trachea, and esophagus demonstrate no significant findings. Lungs/Pleura: Scattered pulmonary nodules measuring up to 9 mm in the right upper lobe (series 5, image 41). Small bilateral pleural effusions. No consolidation, pneumothorax, or findings of pulmonary edema. Upper Abdomen: 20 mm left and 15 mm right adrenal nodules measuring -10 HU compatible with adenoma. Musculoskeletal: No fracture is seen. Chronic postsurgical changes within the right upper quadrant abdominal wall. IMPRESSION: 1. Small bilateral pleural effusions. 2. No consolidation, pneumothorax, or findings of pulmonary edema. 3. Pulmonary nodules measuring up to 9 mm in the right upper lobe. Given history of prior malignancy, short interval follow-up is recommended to ensure stability. 4. Bilateral adrenal adenoma. Electronically Signed   By: Kristine Garbe M.D.   On: 10/16/2016 23:16   [place] Retroperitoneal (renal,aorta,ivc Nodes)  Result Date: 10/13/2016 CLINICAL DATA:  66 year old female with acute on chronic renal failure. History of left nephrectomy. EXAM: RENAL / URINARY TRACT ULTRASOUND COMPLETE COMPARISON:  10/11/2016 CT and 04/12/2014 MR FINDINGS: Right Kidney: Length: 11.3 cm. Echogenicity within normal limits. No mass or hydronephrosis visualized. Left Kidney: Not visualized compatible  with nephrectomy. Bladder: Appears normal for degree of bladder distention. IMPRESSION: 1. Unremarkable right kidney. 2. Status post left nephrectomy. Electronically Signed   By: Margarette Canada M.D.   On: 10/13/2016 18:47      Subjective: - no chest pain, shortness of breath, no abdominal pain, nausea or vomiting.   Discharge Exam: Vitals:   10/18/16 1420 10/18/16 1447  BP: (!) 152/45 (!) 166/59  Pulse: 74   Resp: 19   Temp: 98.9 F (37.2  C)   SpO2: 97%    Vitals:   10/18/16 0900 10/18/16 0951 10/18/16 1420 10/18/16 1447  BP: (!) 143/39 (!) 140/44 (!) 152/45 (!) 166/59  Pulse:   74   Resp:   19   Temp:   98.9 F (37.2 C)   TempSrc:   Oral   SpO2: 95%  97%   Weight:      Height:        General: Pt is alert, awake, not in acute distress Cardiovascular: RRR, S1/S2 +, no rubs, no gallops Respiratory: CTA bilaterally, no wheezing, no rhonchi Abdominal: Soft, NT, ND, bowel sounds + Extremities: no edema, no cyanosis   The results of significant diagnostics from this hospitalization (including imaging, microbiology, ancillary and laboratory) are listed below for reference.     Microbiology: No results found for this or any previous visit (from the past 240 hour(s)).   Labs: BNP (last 3 results)  Recent Labs  10/11/16 0143  BNP 035.0*   Basic Metabolic Panel:  Recent Labs Lab 10/14/16 0435 10/15/16 0524 10/16/16 0402 10/17/16 0423 10/18/16 0419  NA 141 139 137 138 139  K 3.7 3.9 4.0 4.2 4.2  CL 100* 96* 95* 97* 96*  CO2 34* 36* 36* 36* 35*  GLUCOSE 131* 156* 184* 170* 148*  BUN 27* 34* 36* 40* 42*  CREATININE 2.27* 2.51* 2.58* 2.81* 2.74*  CALCIUM 8.7* 8.5* 8.4* 8.5* 8.7*   Liver Function Tests: No results for input(s): AST, ALT, ALKPHOS, BILITOT, PROT, ALBUMIN in the last 168 hours. No results for input(s): LIPASE, AMYLASE in the last 168 hours. No results for input(s): AMMONIA in the last 168 hours. CBC: No results for input(s): WBC, NEUTROABS, HGB,  HCT, MCV, PLT in the last 168 hours. Cardiac Enzymes:  Recent Labs Lab 10/11/16 1628  TROPONINI <0.03   BNP: Invalid input(s): POCBNP CBG:  Recent Labs Lab 10/17/16 1213 10/17/16 1652 10/17/16 2055 10/18/16 0734 10/18/16 1224  GLUCAP 183* 149* 232* 122* 233*   D-Dimer No results for input(s): DDIMER in the last 72 hours. Hgb A1c No results for input(s): HGBA1C in the last 72 hours. Lipid Profile No results for input(s): CHOL, HDL, LDLCALC, TRIG, CHOLHDL, LDLDIRECT in the last 72 hours. Thyroid function studies No results for input(s): TSH, T4TOTAL, T3FREE, THYROIDAB in the last 72 hours.  Invalid input(s): FREET3 Anemia work up No results for input(s): VITAMINB12, FOLATE, FERRITIN, TIBC, IRON, RETICCTPCT in the last 72 hours. Urinalysis    Component Value Date/Time   COLORURINE YELLOW 10/13/2016 1747   APPEARANCEUR CLEAR 10/13/2016 1747   LABSPEC 1.013 10/13/2016 1747   PHURINE 7.0 10/13/2016 1747   GLUCOSEU >=500 (A) 10/13/2016 1747   HGBUR NEGATIVE 10/13/2016 1747   BILIRUBINUR NEGATIVE 10/13/2016 1747   BILIRUBINUR neg 12/31/2013 1458   KETONESUR NEGATIVE 10/13/2016 1747   PROTEINUR >=300 (A) 10/13/2016 1747   UROBILINOGEN 0.2 04/07/2014 0117   NITRITE NEGATIVE 10/13/2016 1747   LEUKOCYTESUR NEGATIVE 10/13/2016 1747   Sepsis Labs Invalid input(s): PROCALCITONIN,  WBC,  LACTICIDVEN Microbiology No results found for this or any previous visit (from the past 240 hour(s)).   Time coordinating discharge: 35 minutes  SIGNED:  Marzetta Board, MD  [facility] 10/18/2016, 3:40 PM Pager [phone]  If 7PM-7AM, please contact night-coverage [email or web address] Password [email or web address]

## 2016-10-18 NOTE — Progress Notes (Signed)
SATURATION QUALIFICATIONS: (This note is used to comply with regulatory documentation for home oxygen)  Patient Saturations on Room Air at Rest = 87%  Patient Saturations on Room Air while Ambulating = N/A  Patient Saturations on 1 Liters of oxygen while Ambulating = 97%  Please briefly explain why patient needs home oxygen: pt. Still needs O2 to maintain saturation >88%, lasix IV given then switched to PO.

## 2016-10-19 ENCOUNTER — Telehealth: Payer: Self-pay | Admitting: Behavioral Health

## 2016-10-19 NOTE — Telephone Encounter (Signed)
Transition Care Management Follow-up Telephone Call  PCP: Ann Held, DO  Admit date: 10/11/2016 Discharge date: 10/18/2016  Admitted From: home Disposition:  home  Recommendations for Outpatient Follow-up:  1. Follow up with PCP in 1-2 weeks 2. Follow up with Dr. Florene Glen in 1-2 weeks  Home Health: none Equipment/Devices: 1L [place] oxygen  Discharge Condition: stable    How have you been since you were released from the hospital? Patient stated, "I'm really tired".   Do you understand why you were in the hospital? yes   Do you understand the discharge instructions? yes   Where were you discharged to? Home   Items Reviewed:  Medications reviewed: yes  Allergies reviewed: yes  Dietary changes reviewed: yes, low sodium diet  Referrals reviewed: yes, Follow up with PCP in 1-2 weeks; Follow up with Dr. Florene Glen in 1-2 weeks   Functional Questionnaire:   Activities of Daily Living (ADLs):   She states they are independent in the following: ambulation, bathing and hygiene, feeding, continence, grooming, toileting and dressing States they require assistance with the following: None   Any transportation issues/concerns?: no   Any patient concerns? no   Confirmed importance and date/time of follow-up visits scheduled yes, 10/25/16 at 11:30 AM.  Provider Appointment booked with Dr. Carollee Herter.  Confirmed with patient if condition begins to worsen call PCP or go to the ER.  Patient was given the office number and encouraged to call back with question or concerns.  : yes

## 2016-10-20 ENCOUNTER — Other Ambulatory Visit: Payer: Self-pay | Admitting: Neurology

## 2016-10-20 DIAGNOSIS — G6181 Chronic inflammatory demyelinating polyneuritis: Secondary | ICD-10-CM

## 2016-10-23 DIAGNOSIS — E113291 Type 2 diabetes mellitus with mild nonproliferative diabetic retinopathy without macular edema, right eye: Secondary | ICD-10-CM | POA: Diagnosis not present

## 2016-10-25 ENCOUNTER — Encounter: Payer: Self-pay | Admitting: Family Medicine

## 2016-10-25 ENCOUNTER — Other Ambulatory Visit: Payer: Self-pay | Admitting: Family Medicine

## 2016-10-25 ENCOUNTER — Ambulatory Visit (HOSPITAL_BASED_OUTPATIENT_CLINIC_OR_DEPARTMENT_OTHER)
Admission: RE | Admit: 2016-10-25 | Discharge: 2016-10-25 | Disposition: A | Discharge status: Home / Self Care | Payer: Medicare Other | Source: Ambulatory Visit | Attending: Family Medicine | Admitting: Family Medicine

## 2016-10-25 ENCOUNTER — Ambulatory Visit (INDEPENDENT_AMBULATORY_CARE_PROVIDER_SITE_OTHER): Payer: Medicare Other | Admitting: Family Medicine

## 2016-10-25 VITALS — BP 126/56 | HR 68 | Ht 61.0 in

## 2016-10-25 DIAGNOSIS — Z794 Long term (current) use of insulin: Secondary | ICD-10-CM

## 2016-10-25 DIAGNOSIS — E1122 Type 2 diabetes mellitus with diabetic chronic kidney disease: Secondary | ICD-10-CM | POA: Diagnosis not present

## 2016-10-25 DIAGNOSIS — N182 Chronic kidney disease, stage 2 (mild): Secondary | ICD-10-CM

## 2016-10-25 DIAGNOSIS — E785 Hyperlipidemia, unspecified: Secondary | ICD-10-CM | POA: Diagnosis not present

## 2016-10-25 DIAGNOSIS — R0602 Shortness of breath: Secondary | ICD-10-CM | POA: Diagnosis not present

## 2016-10-25 DIAGNOSIS — I509 Heart failure, unspecified: Secondary | ICD-10-CM

## 2016-10-25 DIAGNOSIS — I10 Essential (primary) hypertension: Secondary | ICD-10-CM | POA: Diagnosis not present

## 2016-10-25 DIAGNOSIS — E1165 Type 2 diabetes mellitus with hyperglycemia: Secondary | ICD-10-CM

## 2016-10-25 DIAGNOSIS — E1151 Type 2 diabetes mellitus with diabetic peripheral angiopathy without gangrene: Secondary | ICD-10-CM

## 2016-10-25 DIAGNOSIS — IMO0002 Reserved for concepts with insufficient information to code with codable children: Secondary | ICD-10-CM

## 2016-10-25 LAB — HEMOGLOBIN A1C: HEMOGLOBIN A1C: 6.8 % — AB (ref 4.6–6.5)

## 2016-10-25 LAB — COMPREHENSIVE METABOLIC PANEL
ALK PHOS: 73 U/L (ref 39–117)
ALT: 13 U/L (ref 0–35)
AST: 13 U/L (ref 0–37)
Albumin: 3.3 g/dL — ABNORMAL LOW (ref 3.5–5.2)
BILIRUBIN TOTAL: 0.3 mg/dL (ref 0.2–1.2)
BUN: 42 mg/dL — AB (ref 6–23)
CO2: 34 meq/L — AB (ref 19–32)
CREATININE: 2.4 mg/dL — AB (ref 0.40–1.20)
Calcium: 9.6 mg/dL (ref 8.4–10.5)
Chloride: 102 mEq/L (ref 96–112)
GFR: 21.32 mL/min — AB (ref >60.00)
GLUCOSE: 89 mg/dL (ref 70–99)
Potassium: 3.8 mEq/L (ref 3.5–5.1)
SODIUM: 142 meq/L (ref 135–145)
TOTAL PROTEIN: 5.8 g/dL — AB (ref 6.0–8.3)

## 2016-10-25 LAB — LIPID PANEL
Cholesterol: 215 mg/dL — ABNORMAL HIGH (ref 0–200)
HDL: 71.8 mg/dL (ref >39.00)
LDL Cholesterol: 104 mg/dL — ABNORMAL HIGH (ref 0–99)
NONHDL: 143.56
Total CHOL/HDL Ratio: 3
Triglycerides: 200 mg/dL — ABNORMAL HIGH (ref 0.0–149.0)
VLDL: 40 mg/dL (ref 0.0–40.0)

## 2016-10-25 LAB — CBC WITH DIFFERENTIAL/PLATELET
BASOS ABS: 0 10*3/uL (ref 0.0–0.1)
Basophils Relative: 0.6 % (ref 0.0–3.0)
Eosinophils Absolute: 0.2 10*3/uL (ref 0.0–0.7)
Eosinophils Relative: 2.8 % (ref 0.0–5.0)
HCT: 31.9 % — ABNORMAL LOW (ref 36.0–46.0)
Hemoglobin: 10.6 g/dL — ABNORMAL LOW (ref 12.0–15.0)
LYMPHS ABS: 1.1 10*3/uL (ref 0.7–4.0)
LYMPHS PCT: 13.9 % (ref 12.0–46.0)
MCHC: 33.3 g/dL (ref 30.0–36.0)
MCV: 91.3 fl (ref 78.0–100.0)
MONOS PCT: 7.6 % (ref 3.0–12.0)
Monocytes Absolute: 0.6 10*3/uL (ref 0.1–1.0)
NEUTROS PCT: 75.1 % (ref 43.0–77.0)
Neutro Abs: 5.8 10*3/uL (ref 1.4–7.7)
PLATELETS: 216 10*3/uL (ref 150.0–400.0)
RBC: 3.5 Mil/uL — AB (ref 3.87–5.11)
RDW: 14.2 % (ref 11.5–15.5)
WBC: 7.7 10*3/uL (ref 4.0–10.5)

## 2016-10-25 LAB — MICROALBUMIN / CREATININE URINE RATIO
Creatinine,U: 142.6 mg/dL
Microalb Creat Ratio: 62.8 mg/g — ABNORMAL HIGH (ref 0.0–30.0)
Microalb, Ur: 89.6 mg/dL — ABNORMAL HIGH (ref 0.0–1.9)

## 2016-10-25 MED ORDER — FUROSEMIDE 40 MG PO TABS: ORAL_TABLET | ORAL | 1 refills | Status: AC

## 2016-10-25 NOTE — Progress Notes (Signed)
Patient ID: Nicole Strong, female    DOB: [birth year]  Age: 66 y.o. MRN: [id number]    Subjective:  Subjective  HPI Nicole Strong presents for f/u er.  She was admitted 8/30-9/6 for chf.  Pt was diuresed and d/c d home.   She still has sob and uses the O2 all the time.   HYPERTENSION   Blood pressure range-not checking   Chest pain- no      Dyspnea- yes--- on O2 Lightheadedness- no   Edema- no  Other side effects - no   Medication compliance: good Low salt diet- yes     DIABETES    Blood Sugar ranges-elevated per pt   Polyuria- no New Visual problems- no  Hypoglycemic symptoms- no  Other side effects-no Medication compliance - good Last eye exam- due Foot exam- today   HYPERLIPIDEMIA  Medication compliance- good RUQ pain- no  Muscle aches- no Other side effects-no   Review of Systems  Constitutional: Positive for fatigue. Negative for activity change, appetite change and unexpected weight change.  Respiratory: Positive for shortness of breath. Negative for cough.   Cardiovascular: Negative for chest pain and palpitations.  Psychiatric/Behavioral: Negative for behavioral problems and dysphoric mood. The patient is not nervous/anxious.     History Past Medical History:  Diagnosis Date  . Cancer ([place])   . 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 Diabetes mellitus    2  . 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 Hyperlipidemia   . Hypertension   . Renal disorder    L kidney removed  . Renal mass    BEING WORKED UP BY UROLOGY  . 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   Her family history includes Cancer in her father and sister; Healthy in her mother.She reports that she has quit smoking. She has never used smokeless tobacco. She reports that she does not drink alcohol or use drugs.  Current Outpatient Prescriptions on File Prior to Visit    Medication Sig Dispense Refill  . albuterol (VENTOLIN HFA) 108 (90 BASE) MCG/ACT inhaler Inhale 2 puffs into the lungs every 6 (six) hours as needed for wheezing or shortness of breath. 1 Inhaler 1  . amitriptyline (ELAVIL) 10 MG tablet TAKE 4 TABLETS (40 MG TOTAL) BY MOUTH AT BEDTIME. 120 tablet 2  . atorvastatin (LIPITOR) 10 MG tablet Take 1 tablet by mouth 3 times a week. 12 tablet 0  . azelastine (ASTELIN) 0.1 % nasal spray Place 2 sprays into both nostrils 2 (two) times daily. Use in each nostril as directed (Patient taking differently: Place 2 sprays into both nostrils 2 (two) times daily. Use in each nostril as needed allergy) 30 mL 12  . BD PEN NEEDLE NANO U/F 32G X 4 MM MISC USE 1X A DAY 100 each 1  . Blood Glucose Monitoring Suppl (ONETOUCH VERIO IQ SYSTEM) w/Device KIT Used to check sugar 2 times daily. 1 kit 0  . Cholecalciferol (VITAMIN D3) 50000 units CAPS Take 50,000 Units by mouth once a week. 12 capsule 3  . COD LIVER OIL PO Take 1 capsule by mouth every evening.     . docusate sodium (COLACE) 100 MG capsule Take 1 capsule (100 mg total) by mouth daily. 10 capsule 0  . gabapentin (NEURONTIN) 300 MG capsule TAKE ONE CAPSULE BY MOUTH EVERY MORNING, 2 CAPSULES AT LUNCH AND 1 CAPSULE  IN THE AFTERNOON AND 2 EVERY NIGHT AT BEDTIME 180 capsule 1  . glucose blood (ONETOUCH VERIO) test strip Use as instructed to check sugar 2 times daily 200 each 5  . HUMALOG KWIKPEN 100 UNIT/ML KiwkPen INJECT 10-15 UNITS THREE TIMES A DAY WITH MEALS 15 pen 4  . hydrALAZINE (APRESOLINE) 25 MG tablet Take 1 tablet (25 mg total) by mouth every 8 (eight) hours. 90 tablet 1  . Insulin Glargine (LANTUS SOLOSTAR) 100 UNIT/ML Solostar Pen Inject 15 Units into the skin daily at 10 pm. 5 pen 5  . insulin lispro (HUMALOG KWIKPEN) 100 UNIT/ML KiwkPen Inject 10-15 units three times daily with meals. 15 pen 5  . ipratropium (ATROVENT HFA) 17 MCG/ACT inhaler Inhale 2 puffs into the lungs every 6 (six) hours as needed for  wheezing. 1 Inhaler 2  . Lancets (ONETOUCH ULTRASOFT) lancets Use as instructed to check sugar 2 times daily. 200 each 5  . oxyCODONE-acetaminophen (PERCOCET/ROXICET) 5-325 MG per tablet Take 1 tablet by mouth every 4 (four) hours as needed. 15 tablet 0  . promethazine (PHENERGAN) 25 MG tablet Take 1 tablet (25 mg total) by mouth every 8 (eight) hours as needed for nausea or vomiting. 90 tablet 3  . traMADol (ULTRAM) 50 MG tablet TAKE 1 TABLET BY MOUTH EVERY MORNING AND TAKE 2 TABLETS EVERY EVENING 90 tablet 1   No current facility-administered medications on file prior to visit.      Objective:  Objective  Physical Exam  Constitutional: She is oriented to person, place, and time. She appears well-developed and well-nourished.  HENT:  Head: Normocephalic and atraumatic.  Eyes: Conjunctivae and EOM are normal.  Neck: Normal range of motion. Neck supple. No JVD present. Carotid bruit is not present. No thyromegaly present.  Cardiovascular: Normal rate, regular rhythm and normal heart sounds.   No murmur heard. Pulmonary/Chest: Effort normal and breath sounds normal. No respiratory distress. She has no wheezes. She has no rales. She exhibits no tenderness.  Musculoskeletal: She exhibits no edema.  Neurological: She is alert and oriented to person, place, and time.  Psychiatric: She has a normal mood and affect.  Nursing note and vitals reviewed. Sensory exam of the foot is normal, tested with the monofilament. Good pulses, no lesions or ulcers, good peripheral pulses. BP (!) 126/56 (BP Location: Right Arm, Patient Position: Sitting, Cuff Size: Large)   Pulse 68   Ht _0  (1.549 m)   SpO2 95%  Wt Readings from Last 3 Encounters:  10/17/16 223 lb 6.4 oz (101.3 kg)  01/20/16 200 lb (90.7 kg)  06/07/15 204 lb (92.5 kg)     Lab Results  Component Value Date   WBC 7.7 10/25/2016   HGB 10.6 (L) 10/25/2016   HCT 31.9 (L) 10/25/2016   PLT 216.0 10/25/2016   GLUCOSE 89 10/25/2016    CHOL 215 (H) 10/25/2016   TRIG 200.0 (H) 10/25/2016   HDL 71.80 10/25/2016   LDLDIRECT 210.4 12/31/2013   LDLCALC 104 (H) 10/25/2016   ALT 13 10/25/2016   AST 13 10/25/2016   NA 142 10/25/2016   K 3.8 10/25/2016   CL 102 10/25/2016   CREATININE 2.40 (H) 10/25/2016   BUN 42 (H) 10/25/2016   CO2 34 (H) 10/25/2016   TSH 3.631 10/11/2016   INR 0.96 06/18/2012   HGBA1C 6.8 (H) 10/25/2016   MICROALBUR 89.6 (H) 10/25/2016    Ct Abdomen Pelvis Wo Contrast  Result Date: 10/11/2016 CLINICAL DATA:  Flank pain, question stones EXAM: CT  ABDOMEN AND PELVIS WITHOUT CONTRAST TECHNIQUE: Multidetector CT imaging of the abdomen and pelvis was performed following the standard protocol without IV contrast. COMPARISON:  CT 12/11/2013 FINDINGS: Lower chest: Small bilateral pleural effusions. Heart is normal size. Bibasilar atelectasis. Mitral valve annular calcifications noted. Hepatobiliary: No focal liver abnormality is seen. Status post cholecystectomy. No biliary dilatation. Pancreas: No focal abnormality or ductal dilatation. Spleen: No focal abnormality.  Normal size. Adrenals/Urinary Tract: Prior left nephrectomy. No renal or ureteral stones on the right. No hydronephrosis. 16 mm low-density nodule in the right adrenal gland compatible with adenoma. Small adenoma also in the left adrenal gland. Urinary bladder unremarkable. Stomach/Bowel: Normal appendix. Stomach, large and small bowel grossly unremarkable. Vascular/Lymphatic: No evidence of aneurysm or adenopathy. Aortic and iliac calcifications. Reproductive: Uterus and adnexa unremarkable.  No mass. Other: No free fluid or free air. Musculoskeletal: No acute bony abnormality. IMPRESSION: Prior left nephrectomy.  No right ureteral stones or hydronephrosis. Small bilateral adrenal adenomas. Small bilateral pleural effusions.  Bibasilar atelectasis. Electronically Signed   By: Rolm Baptise M.D.   On: 10/11/2016 10:08     Assessment & Plan:  Plan  I am  having Ms. Takach maintain her COD LIVER OIL PO, oxyCODONE-acetaminophen, albuterol, azelastine, ipratropium, BD PEN NEEDLE NANO U/F, Vitamin D3, glucose blood, onetouch ultrasoft, ONETOUCH VERIO IQ SYSTEM, promethazine, insulin lispro, HUMALOG KWIKPEN, atorvastatin, amitriptyline, gabapentin, docusate sodium, Insulin Glargine, hydrALAZINE, and traMADol.  No orders of the defined types were placed in this encounter.   Problem List Items Addressed This Visit      Unprioritized   CHF (congestive heart failure) ([place]) - Primary   Relevant Orders   DG Chest 2 View (Completed)   Essential hypertension    Well controlled, no changes to meds. Encouraged heart healthy diet such as the DASH diet and exercise as tolerated.        Relevant Orders   CBC with Differential/Platelet (Completed)   Comprehensive metabolic panel (Completed)   Microalbumin / creatinine urine ratio (Completed)   Hyperlipidemia    Tolerating statin, encouraged heart healthy diet, avoid trans fats, minimize simple carbs and saturated fats. Increase exercise as tolerated      Uncontrolled type 2 diabetes mellitus with stage 2 chronic kidney disease, with long-term current use of insulin ([facility])    hgba1c to be checked, minimize simple carbs. Increase exercise as tolerated. Continue current meds Per endo        Other Visit Diagnoses    DM (diabetes mellitus) type II uncontrolled, periph vascular disorder ([place])       Relevant Orders   Hemoglobin A1c (Completed)   Comprehensive metabolic panel (Completed)   Microalbumin / creatinine urine ratio (Completed)   Hyperlipidemia LDL goal <70       Relevant Orders   Lipid panel (Completed)   Hemoglobin A1c (Completed)   Comprehensive metabolic panel (Completed)      Follow-up: Return in about 3 months (around 01/24/2017), or if symptoms worsen or fail to improve.  Ann Held, DO

## 2016-10-25 NOTE — Telephone Encounter (Signed)
Please refer to cardiology in [place]

## 2016-10-25 NOTE — Assessment & Plan Note (Signed)
Tolerating statin, encouraged heart healthy diet, avoid trans fats, minimize simple carbs and saturated fats. Increase exercise as tolerated 

## 2016-10-25 NOTE — Assessment & Plan Note (Signed)
Well controlled, no changes to meds. Encouraged heart healthy diet such as the DASH diet and exercise as tolerated.  °

## 2016-10-25 NOTE — Assessment & Plan Note (Signed)
hgba1c to be checked, minimize simple carbs. Increase exercise as tolerated. Continue current meds Per endo  

## 2016-10-25 NOTE — Patient Instructions (Signed)
Heart Failure °Heart failure is a condition in which the heart has trouble pumping blood because it has become weak or stiff. This means that the heart does not pump blood efficiently for the body to work well. For some people with heart failure, fluid may back up into the lungs and there may be swelling (edema) in the lower legs. Heart failure is usually a long-term (chronic) condition. It is important for you to take good care of yourself and follow the treatment plan from your health care provider. °What are the causes? °This condition is caused by some health problems, including: °· High blood pressure (hypertension). Hypertension causes the heart muscle to work harder than normal. High blood pressure eventually causes the heart to become stiff and weak. °· Coronary artery disease (CAD). CAD is the buildup of cholesterol and fat (plaques) in the arteries of the heart. °· Heart attack (myocardial infarction). Injured tissue, which is caused by the heart attack, does not contract as well and the heart's ability to pump blood is weakened. °· Abnormal heart valves. When the heart valves do not open and close properly, the heart muscle must pump harder to keep the blood flowing. °· Heart muscle disease (cardiomyopathy or myocarditis). Heart muscle disease is damage to the heart muscle from a variety of causes, such as drug or alcohol abuse, infections, or unknown causes. These can increase the risk of heart failure. °· Lung disease. When the lungs do not work properly, the heart must work harder. ° °What increases the risk? °Risk of heart failure increases as a person ages. This condition is also more likely to develop in people who: °· Are overweight. °· Are female. °· Smoke or chew tobacco. °· Abuse alcohol or illegal drugs. °· Have taken medicines that can damage the heart, such as chemotherapy drugs. °· Have diabetes. °? High blood sugar (glucose) is associated with high fat (lipid) levels in the blood. °? Diabetes  can also damage tiny blood vessels that carry nutrients to the heart muscle. °· Have abnormal heart rhythms. °· Have thyroid problems. °· Have low blood counts (anemia). ° °What are the signs or symptoms? °Symptoms of this condition include: °· Shortness of breath with activity, such as when climbing stairs. °· Persistent cough. °· Swelling of the feet, ankles, legs, or abdomen. °· Unexplained weight gain. °· Difficulty breathing when lying flat (orthopnea). °· Waking from sleep because of the need to sit up and get more air. °· Rapid heartbeat. °· Fatigue and loss of energy. °· Feeling light-headed, dizzy, or close to fainting. °· Loss of appetite. °· Nausea. °· Increased urination during the night (nocturia). °· Confusion. ° °How is this diagnosed? °This condition is diagnosed based on: °· Medical history, symptoms, and a physical exam. °· Diagnostic tests, which may include: °? Echocardiogram. °? Electrocardiogram (ECG). °? Chest X-ray. °? Blood tests. °? Exercise stress test. °? Radionuclide scans. °? Cardiac catheterization and angiogram. ° °How is this treated? °Treatment for this condition is aimed at managing the symptoms of heart failure. Medicines, behavioral changes, or other treatments may be necessary to treat heart failure. °Medicines °These may include: °· Angiotensin-converting enzyme (ACE) inhibitors. This type of medicine blocks the effects of a blood protein called angiotensin-converting enzyme. ACE inhibitors relax (dilate) the blood vessels and help to lower blood pressure. °· Angiotensin receptor blockers (ARBs). This type of medicine blocks the actions of a blood protein called angiotensin. ARBs dilate the blood vessels and help to lower blood pressure. °· Water   pills (diuretics). Diuretics cause the kidneys to remove salt and water from the blood. The extra fluid is removed through urination, leaving a lower volume of blood that the heart has to pump. °· Beta blockers. These improve heart  muscle strength and they prevent the heart from beating too quickly. °· Digoxin. This increases the force of the heartbeat. ° °Healthy behavior changes °These may include: °· Reaching and maintaining a healthy weight. °· Stopping smoking or chewing tobacco. °· Eating heart-healthy foods. °· Limiting or avoiding alcohol. °· Stopping use of street drugs (illegal drugs). °· Physical activity. ° °Other treatments °These may include: °· Surgery to open blocked coronary arteries or repair damaged heart valves. °· Placement of a biventricular pacemaker to improve heart muscle function (cardiac resynchronization therapy). This device paces both the right ventricle and left ventricle. °· Placement of a device to treat serious abnormal heart rhythms (implantable cardioverter defibrillator, or ICD). °· Placement of a device to improve the pumping ability of the heart (left ventricular assist device, or LVAD). °· Heart transplant. This can cure heart failure, and it is considered for certain patients who do not improve with other therapies. ° °Follow these instructions at home: °Medicines °· Take over-the-counter and prescription medicines only as told by your health care provider. Medicines are important in reducing the workload of your heart, slowing the progression of heart failure, and improving your symptoms. °? Do not stop taking your medicine unless your health care provider told you to do that. °? Do not skip any dose of medicine. °? Refill your prescriptions before you run out of medicine. You need your medicines every day. °Eating and drinking ° °· Eat heart-healthy foods. Talk with a dietitian to make an eating plan that is right for you. °? Choose foods that contain no trans fat and are low in saturated fat and cholesterol. Healthy choices include fresh or frozen fruits and vegetables, fish, lean meats, legumes, fat-free or low-fat dairy products, and whole-grain or high-fiber foods. °? Limit salt (sodium) if  directed by your health care provider. Sodium restriction may reduce symptoms of heart failure. Ask a dietitian to recommend heart-healthy seasonings. °? Use healthy cooking methods instead of frying. Healthy methods include roasting, grilling, broiling, baking, poaching, steaming, and stir-frying. °· Limit your fluid intake if directed by your health care provider. Fluid restriction may reduce symptoms of heart failure. °Lifestyle °· Stop smoking or using chewing tobacco. Nicotine and tobacco can damage your heart and your blood vessels. Do not use nicotine gum or patches before talking to your health care provider. °· Limit alcohol intake to no more than 1 drink per day for non-pregnant women and 2 drinks per day for men. One drink equals 12 oz of beer, 5 oz of wine, or 1½ oz of hard liquor. °? Drinking more than that is harmful to your heart. Tell your health care provider if you drink alcohol several times a week. °? Talk with your health care provider about whether any level of alcohol use is safe for you. °? If your heart has already been damaged by alcohol or you have severe heart failure, drinking alcohol should be stopped completely. °· Stop use of illegal drugs. °· Lose weight if directed by your health care provider. Weight loss may reduce symptoms of heart failure. °· Do moderate physical activity if directed by your health care provider. People who are elderly and people with severe heart failure should consult with a health care provider for physical activity recommendations. °  Monitor important information °· Weigh yourself every day. Keeping track of your weight daily helps you to notice excess fluid sooner. °? Weigh yourself every morning after you urinate and before you eat breakfast. °? Wear the same amount of clothing each time you weigh yourself. °? Record your daily weight. Provide your health care provider with your weight record. °· Monitor and record your blood pressure as told by your health  care provider. °· Check your pulse as told by your health care provider. °Dealing with extreme temperatures °· If the weather is extremely hot: °? Avoid vigorous physical activity. °? Use air conditioning or fans or seek a cooler location. °? Avoid caffeine and alcohol. °? Wear loose-fitting, lightweight, and light-colored clothing. °· If the weather is extremely cold: °? Avoid vigorous physical activity. °? Layer your clothes. °? Wear mittens or gloves, a hat, and a scarf when you go outside. °? Avoid alcohol. °General instructions °· Manage other health conditions such as hypertension, diabetes, thyroid disease, or abnormal heart rhythms as told by your health care provider. °· Learn to manage stress. If you need help to do this, ask your health care provider. °· Plan rest periods when fatigued. °· Get ongoing education and support as needed. °· Participate in or seek rehabilitation as needed to maintain or improve independence and quality of life. °· Stay up to date with immunizations. Keeping current on pneumococcal and influenza immunizations is especially important to prevent respiratory infections. °· Keep all follow-up visits as told by your health care provider. This is important. °Contact a health care provider if: °· You have a rapid weight gain. °· You have increasing shortness of breath that is unusual for you. °· You are unable to participate in your usual physical activities. °· You tire easily. °· You cough more than normal, especially with physical activity. °· You have any swelling or more swelling in areas such as your hands, feet, ankles, or abdomen. °· You are unable to sleep because it is hard to breathe. °· You feel like your heart is beating quickly (palpitations). °· You become dizzy or light-headed when you stand up. °Get help right away if: °· You have difficulty breathing. °· You notice or your family notices a change in your awareness, such as having trouble staying awake or having  difficulty with concentration. °· You have pain or discomfort in your chest. °· You have an episode of fainting (syncope). °This information is not intended to replace advice given to you by your health care provider. Make sure you discuss any questions you have with your health care provider. °Document Released: 01/29/2005 Document Revised: 10/04/2015 Document Reviewed: 08/24/2015 °Elsevier Interactive Patient Education © 2017 [employer] ° °

## 2016-10-29 ENCOUNTER — Other Ambulatory Visit: Payer: Self-pay | Admitting: Family Medicine

## 2016-10-29 DIAGNOSIS — E785 Hyperlipidemia, unspecified: Secondary | ICD-10-CM

## 2016-10-29 DIAGNOSIS — E119 Type 2 diabetes mellitus without complications: Secondary | ICD-10-CM

## 2016-10-29 MED ORDER — ATORVASTATIN CALCIUM 10 MG PO TABS: 10.0000 mg | ORAL_TABLET | Freq: Every day | ORAL | 3 refills | Status: AC

## 2016-10-30 ENCOUNTER — Encounter: Payer: Self-pay | Admitting: Family Medicine

## 2016-10-31 ENCOUNTER — Telehealth: Payer: Self-pay | Admitting: Family Medicine

## 2016-10-31 NOTE — Telephone Encounter (Signed)
Spoke with pt regarding AWV. Pt stated that she is in the process of moving and that she will give office a call when she is ready to schedule appt.

## 2016-11-01 NOTE — Telephone Encounter (Signed)
Ok to send rx to company-- just need that info from pt

## 2016-11-03 ENCOUNTER — Emergency Department (HOSPITAL_COMMUNITY): Payer: Medicare Other

## 2016-11-03 ENCOUNTER — Encounter (HOSPITAL_COMMUNITY): Payer: Self-pay | Admitting: Emergency Medicine

## 2016-11-03 ENCOUNTER — Emergency Department (HOSPITAL_COMMUNITY)
Admission: EM | Admit: 2016-11-03 | Discharge: 2016-11-04 | Disposition: A | Discharge status: Home / Self Care | Payer: Medicare Other | Attending: Emergency Medicine | Admitting: Emergency Medicine

## 2016-11-03 DIAGNOSIS — J069 Acute upper respiratory infection, unspecified: Secondary | ICD-10-CM | POA: Diagnosis not present

## 2016-11-03 DIAGNOSIS — R509 Fever, unspecified: Secondary | ICD-10-CM | POA: Diagnosis not present

## 2016-11-03 DIAGNOSIS — R270 Ataxia, unspecified: Secondary | ICD-10-CM | POA: Diagnosis not present

## 2016-11-03 DIAGNOSIS — R42 Dizziness and giddiness: Secondary | ICD-10-CM | POA: Diagnosis not present

## 2016-11-03 DIAGNOSIS — Z880 Allergy status to penicillin: Secondary | ICD-10-CM | POA: Diagnosis not present

## 2016-11-03 DIAGNOSIS — Z885 Allergy status to narcotic agent status: Secondary | ICD-10-CM | POA: Insufficient documentation

## 2016-11-03 DIAGNOSIS — N182 Chronic kidney disease, stage 2 (mild): Secondary | ICD-10-CM | POA: Insufficient documentation

## 2016-11-03 DIAGNOSIS — R0602 Shortness of breath: Secondary | ICD-10-CM | POA: Diagnosis not present

## 2016-11-03 DIAGNOSIS — I509 Heart failure, unspecified: Secondary | ICD-10-CM | POA: Insufficient documentation

## 2016-11-03 DIAGNOSIS — Z85528 Personal history of other malignant neoplasm of kidney: Secondary | ICD-10-CM | POA: Diagnosis not present

## 2016-11-03 DIAGNOSIS — E1122 Type 2 diabetes mellitus with diabetic chronic kidney disease: Secondary | ICD-10-CM | POA: Diagnosis not present

## 2016-11-03 DIAGNOSIS — Z87891 Personal history of nicotine dependence: Secondary | ICD-10-CM | POA: Diagnosis not present

## 2016-11-03 DIAGNOSIS — Z794 Long term (current) use of insulin: Secondary | ICD-10-CM | POA: Insufficient documentation

## 2016-11-03 DIAGNOSIS — Z79899 Other long term (current) drug therapy: Secondary | ICD-10-CM | POA: Insufficient documentation

## 2016-11-03 DIAGNOSIS — I130 Hypertensive heart and chronic kidney disease with heart failure and stage 1 through stage 4 chronic kidney disease, or unspecified chronic kidney disease: Secondary | ICD-10-CM | POA: Diagnosis not present

## 2016-11-03 LAB — COMPREHENSIVE METABOLIC PANEL
ALBUMIN: 2.2 g/dL — AB (ref 3.5–5.0)
ALT: 88 U/L — ABNORMAL HIGH (ref 14–54)
ANION GAP: 9 (ref 5–15)
AST: 57 U/L — AB (ref 15–41)
Alkaline Phosphatase: 222 U/L — ABNORMAL HIGH (ref 38–126)
BUN: 47 mg/dL — ABNORMAL HIGH (ref 6–20)
CALCIUM: 8.4 mg/dL — AB (ref 8.9–10.3)
CHLORIDE: 98 mmol/L — AB (ref 101–111)
CO2: 27 mmol/L (ref 22–32)
Creatinine, Ser: 3.28 mg/dL — ABNORMAL HIGH (ref 0.44–1.00)
GFR calc Af Amer: 16 mL/min — ABNORMAL LOW (ref >60)
GFR calc non Af Amer: 13 mL/min — ABNORMAL LOW (ref >60)
GLUCOSE: 347 mg/dL — AB (ref 65–99)
POTASSIUM: 3.3 mmol/L — AB (ref 3.5–5.1)
SODIUM: 134 mmol/L — AB (ref 135–145)
Total Bilirubin: 0.9 mg/dL (ref 0.3–1.2)
Total Protein: 5.2 g/dL — ABNORMAL LOW (ref 6.5–8.1)

## 2016-11-03 LAB — CBC
HEMATOCRIT: 27.2 % — AB (ref 36.0–46.0)
HEMOGLOBIN: 9.1 g/dL — AB (ref 12.0–15.0)
MCH: 29.2 pg (ref 26.0–34.0)
MCHC: 33.5 g/dL (ref 30.0–36.0)
MCV: 87.2 fL (ref 78.0–100.0)
Platelets: 217 10*3/uL (ref 150–400)
RBC: 3.12 MIL/uL — AB (ref 3.87–5.11)
RDW: 13.4 % (ref 11.5–15.5)
WBC: 10.6 10*3/uL — AB (ref 4.0–10.5)

## 2016-11-03 LAB — URINALYSIS, ROUTINE W REFLEX MICROSCOPIC
BILIRUBIN URINE: NEGATIVE
Glucose, UA: >=500 mg/dL — AB
KETONES UR: NEGATIVE mg/dL
LEUKOCYTES UA: NEGATIVE
NITRITE: NEGATIVE
Specific Gravity, Urine: 1.02 (ref 1.005–1.030)
pH: 5 (ref 5.0–8.0)

## 2016-11-03 LAB — LIPASE, BLOOD: Lipase: 31 U/L (ref 11–51)

## 2016-11-03 MED ORDER — SODIUM CHLORIDE 0.9 % IV BOLUS (SEPSIS)
250.0000 mL | Freq: Once | INTRAVENOUS | Status: AC
  Administered 2016-11-03: 250 mL via INTRAVENOUS

## 2016-11-03 MED ORDER — SODIUM CHLORIDE 0.9 % IV SOLN
INTRAVENOUS | Status: DC
Start: 2016-11-04 — End: 2016-11-04

## 2016-11-03 MED ORDER — SODIUM CHLORIDE 0.9 % IV SOLN
INTRAVENOUS | Status: DC
  Administered 2016-11-03: 19:00:00 via INTRAVENOUS

## 2016-11-03 NOTE — Discharge Instructions (Signed)
Return for any new or worse symptoms. Chest x-ray without evidence of pneumonia. Labs without significant abnormalities. Also no evidence of any congestive heart failure. Continue your oxygen at home. Tylenol for fever. Make a plan follow-up with your doctor. Also urinalysis not consistent with urinary tract infection. Rest at home return for any new or worse symptoms.

## 2016-11-03 NOTE — ED Triage Notes (Signed)
Per [facility],  Pt reports dizziness/weakness x 2 days. Pt reports SHOB x 2 days as well. Pt had a fall today. Pt states she was standing, washing clothes. She turned really quick and slid to the floor. Pt denies any pain. Pt was recently discharged from the hospital, recently diagnosed with CHF. Pt alert and oriented x4. Pt wwears 1 L [place] O2 at home. Pt placed on 2 L here for O2 sats 92-93%.

## 2016-11-03 NOTE — ED Notes (Signed)
Pt on bedpan, attempting to give urine sample.

## 2016-11-03 NOTE — ED Notes (Addendum)
Patient ambulated with walker while on pulse oximetry, O2 sats remained between 93-98 and HR remained 75-82 range. Complained of a little dizziness but walked with a steady gait.

## 2016-11-03 NOTE — ED Provider Notes (Signed)
[facility] DEPT Provider Note   CSN: [id number] Arrival date & time: 11/03/16  1659     History   Chief Complaint Chief Complaint  Patient presents with  . Dizziness  . Shortness of Breath    HPI Nicole Strong is a 66 y.o. female.  Patient with recent admission to the hospital on August 30. Was in the hospital for about a week. For that she was better for shortness of breath. Had some congestive heart failure. Patient presents today with a complaint of 2 day history of feeling fatigued in general weakness and some dizziness but no true room spinning. Patient also had increased shortness of breath today. Patient is on 1 L of oxygen at home at all times. Patient had a fall due to the dizziness again no room spinning. She was standing washing close and then slid down to the floor. No injuries. Patient started on 2 L of oxygen upon presentation here. Oxygen sats have been in the mid 90s. Patient laying still feels much better. No specific complaints. She's had some cough and some congestion. And now felt as if she had a fever. Night any dysuria. Admitted to the cough and some shortness of breath no chills. No abdominal pain no nausea vomiting or diarrhea no headache. No unusual rash. Does admit to some leg swelling.      Past Medical History:  Diagnosis Date  . Cancer ([place])   . 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 Diabetes mellitus    2  . 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 Hyperlipidemia   . Hypertension   . Renal disorder    L kidney removed  . Renal mass    BEING WORKED UP BY UROLOGY  . 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 Acute on chronic respiratory failure with hypoxia ([place]) 10/11/2016  . CHF (congestive heart failure) ([place]) 10/11/2016  . Axonal GBS (Guillain-Barre syndrome) ([place]) 12/29/2015  . Vitamin D deficiency 12/29/2015  . Pain in toe of left foot 06/12/2015  .  Uncontrolled type 2 diabetes mellitus with stage 2 chronic kidney disease, with long-term current use of insulin ([place]) 04/21/2015  . 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 CIDP (chronic inflammatory demyelinating polyneuropathy) ([place]) 12/29/2014  . 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 Enteritis due to Clostridium difficile 08/15/2013  . ARF (acute renal failure) ([place]) 08/14/2013  . Acute renal failure ([place]) 08/14/2013  . Obesity (BMI 30-39.9) 11/19/2012  . Single kidney   . Diarrhea 05/31/2012  . Dehydration 05/31/2012  . Weakness generalized 05/31/2012  . Hyponatremia 05/31/2012  . Gait disorder 05/19/2012  . 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 NEUROPATHY 01/26/2010  . BACK PAIN, LUMBAR, WITH RADICULOPATHY 01/26/2010  . NAUSEA WITH VOMITING 01/26/2010  . Hyperlipidemia 10/19/2009  . Essential hypertension 10/19/2009  . CARDIAC MURMUR 10/19/2009  . POSTMENOPAUSAL STATUS 10/19/2009    Past Surgical History:  Procedure Laterality Date  . CESAREAN SECTION    . CHOLECYSTECTOMY    . NEPHRECTOMY      OB History    No data available       Home Medications    Prior to Admission medications   Medication Sig Start Date End Date Taking? Authorizing Provider  albuterol (VENTOLIN HFA) 108 (90 BASE) MCG/ACT inhaler Inhale 2 puffs into the lungs every 6 (six) hours as  needed for wheezing or shortness of breath. 08/18/14  Yes Paz, Alda Berthold, MD  amitriptyline (ELAVIL) 10 MG tablet TAKE 4 TABLETS (40 MG TOTAL) BY MOUTH AT BEDTIME. 07/20/16  Yes Dohmeier, Asencion Partridge, MD  atorvastatin (LIPITOR) 10 MG tablet Take 1 tablet (10 mg total) by mouth daily. Patient taking differently: Take 20 mg by mouth daily.  10/29/16  Yes Ann Held, DO  Cholecalciferol (VITAMIN D3) 50000 units CAPS Take 50,000 Units by mouth once a week. 12/29/15  Yes Dohmeier, Asencion Partridge, MD  docusate sodium (COLACE) 100 MG  capsule Take 1 capsule (100 mg total) by mouth daily. 10/17/16  Yes Donne Hazel, MD  furosemide (LASIX) 40 MG tablet Take 1 and 1/2 daily Patient taking differently: Take 60 mg by mouth daily.  10/25/16  Yes Roma Schanz R, DO  gabapentin (NEURONTIN) 300 MG capsule TAKE ONE CAPSULE BY MOUTH EVERY MORNING, 2 CAPSULES AT LUNCH AND 1 CAPSULE IN THE AFTERNOON AND 2 EVERY NIGHT AT BEDTIME Patient taking differently: TAKE 900MG BY MOUTH TWICE DAILY 09/03/16  Yes Dohmeier, Asencion Partridge, MD  HUMALOG KWIKPEN 100 UNIT/ML KiwkPen INJECT 10-15 UNITS THREE TIMES A DAY WITH MEALS Patient taking differently: INJECT 15-18 UNITS THREE TIMES A DAY WITH MEALS PER SLIDING SCALE 04/09/16  Yes Philemon Kingdom, MD  hydrALAZINE (APRESOLINE) 25 MG tablet Take 1 tablet (25 mg total) by mouth every 8 (eight) hours. 10/18/16  Yes Caren Griffins, MD  Insulin Glargine (LANTUS SOLOSTAR) 100 UNIT/ML Solostar Pen Inject 15 Units into the skin daily at 10 pm. 10/16/16  Yes Donne Hazel, MD  ipratropium (ATROVENT HFA) 17 MCG/ACT inhaler Inhale 2 puffs into the lungs every 6 (six) hours as needed for wheezing. 01/27/15  Yes Saguier, Percell Miller, PA-C  oxyCODONE-acetaminophen (PERCOCET/ROXICET) 5-325 MG per tablet Take 1 tablet by mouth every 4 (four) hours as needed. 03/15/14  Yes Ward, Delice Bison, DO  promethazine (PHENERGAN) 25 MG tablet Take 1 tablet (25 mg total) by mouth every 8 (eight) hours as needed for nausea or vomiting. 04/04/16  Yes Dohmeier, Asencion Partridge, MD  traMADol (ULTRAM) 50 MG tablet TAKE 1 TABLET BY MOUTH EVERY MORNING AND TAKE 2 TABLETS EVERY EVENING Patient taking differently: TAKE 100MG BY MOUTH EVERY NIGHT 10/22/16  Yes Dohmeier, Asencion Partridge, MD  azelastine (ASTELIN) 0.1 % nasal spray Place 2 sprays into both nostrils 2 (two) times daily. Use in each nostril as directed Patient not taking: Reported on 11/03/2016 01/27/15   Saguier, Percell Miller, PA-C  BD PEN NEEDLE NANO U/F 32G X 4 MM MISC USE 1X A DAY 08/29/15   Philemon Kingdom, MD    Blood Glucose Monitoring Suppl (ONETOUCH VERIO IQ SYSTEM) w/Device KIT Used to check sugar 2 times daily. 02/23/16   Philemon Kingdom, MD  glucose blood (ONETOUCH VERIO) test strip Use as instructed to check sugar 2 times daily 02/23/16   Philemon Kingdom, MD  insulin lispro (HUMALOG KWIKPEN) 100 UNIT/ML KiwkPen Inject 10-15 units three times daily with meals. Patient not taking: Reported on 11/03/2016 04/04/16   Philemon Kingdom, MD  Lancets [facility] ULTRASOFT) lancets Use as instructed to check sugar 2 times daily. 02/23/16   Philemon Kingdom, MD    Family History Family History  Problem Relation Age of Onset  . Cancer Father        PROSTATE  . Healthy Mother   . Cancer Sister        BREAST    Social History Social History  Substance Use Topics  . Smoking status: Former  Smoker  . Smokeless tobacco: Never Used  . Alcohol use No     Allergies   Biaxin [clarithromycin]; Ciprofloxacin; Codeine; Erythromycin; Influenza vaccines; Januvia [sitagliptin]; Prednisone; and Augmentin [amoxicillin-pot clavulanate]   Review of Systems Review of Systems  Constitutional: Positive for fever. Negative for chills.  HENT: Positive for congestion.   Eyes: Negative for visual disturbance.  Respiratory: Positive for shortness of breath.   Cardiovascular: Positive for leg swelling. Negative for chest pain.  Gastrointestinal: Negative for abdominal pain, diarrhea, nausea and vomiting.  Genitourinary: Negative for dysuria.  Musculoskeletal: Negative for back pain, myalgias and neck pain.  Skin: Negative for rash.  Neurological: Negative for headaches.  Hematological: Does not bruise/bleed easily.  Psychiatric/Behavioral: Negative for confusion.     Physical Exam Updated Vital Signs BP (!) 125/50   Pulse 71   Temp (!) 100.6 F (38.1 C) (Oral)   Resp 17   SpO2 98%   Physical Exam  Constitutional: She is oriented to person, place, and time. She appears well-developed and  well-nourished. No distress.  HENT:  Head: Normocephalic and atraumatic.  Mouth/Throat: Oropharynx is clear and moist.  Eyes: Pupils are equal, round, and reactive to light. Conjunctivae and EOM are normal.  Neck: Normal range of motion. Neck supple.  Cardiovascular: Normal rate, regular rhythm and normal heart sounds.   Pulmonary/Chest: Effort normal and breath sounds normal.  Abdominal: Soft. Bowel sounds are normal. There is no tenderness.  Musculoskeletal: Normal range of motion. She exhibits edema.  Neurological: She is alert and oriented to person, place, and time. No cranial nerve deficit or sensory deficit. She exhibits normal muscle tone. Coordination normal.  Skin: Skin is warm. No rash noted.  Nursing note and vitals reviewed.    ED Treatments / Results  Labs (all labs ordered are listed, but only abnormal results are displayed) Labs Reviewed  CBC - Abnormal; Notable for the following:       Result Value   WBC 10.6 (*)    RBC 3.12 (*)    Hemoglobin 9.1 (*)    HCT 27.2 (*)    All other components within normal limits  URINALYSIS, ROUTINE W REFLEX MICROSCOPIC - Abnormal; Notable for the following:    Color, Urine AMBER (*)    APPearance HAZY (*)    Glucose, UA >=500 (*)    Hgb urine dipstick MODERATE (*)    Protein, ur >=300 (*)    Bacteria, UA RARE (*)    Squamous Epithelial / LPF 0-5 (*)    All other components within normal limits  COMPREHENSIVE METABOLIC PANEL - Abnormal; Notable for the following:    Sodium 134 (*)    Potassium 3.3 (*)    Chloride 98 (*)    Glucose, Bld 347 (*)    BUN 47 (*)    Creatinine, Ser 3.28 (*)    Calcium 8.4 (*)    Total Protein 5.2 (*)    Albumin 2.2 (*)    AST 57 (*)    ALT 88 (*)    Alkaline Phosphatase 222 (*)    GFR calc non Af Amer 13 (*)    GFR calc Af Amer 16 (*)    All other components within normal limits  LIPASE, BLOOD  CBG MONITORING, ED    EKG  EKG Interpretation  Date/Time:  Saturday November 03 2016  17:14:43 EDT Ventricular Rate:  76 PR Interval:    QRS Duration: 104 QT Interval:  397 QTC Calculation: 447 R Axis:   65  Text Interpretation:  Sinus rhythm Low voltage, precordial leads Abnormal T, consider ischemia, lateral leads No significant change since last tracing Confirmed by Fredia Sorrow [phone]) on 11/03/2016 5:32:56 PM       Radiology Dg Chest 2 View  Result Date: 11/03/2016 CLINICAL DATA:  Shortness of breath and fever. EXAM: CHEST  2 VIEW COMPARISON:  10/25/2016 FINDINGS: Lungs are adequately inflated without focal consolidation or effusion. Stable elevation the right hemidiaphragm. Mild stable cardiomegaly. Calcified plaque over the thoracic aorta. Mild degenerative change of the spine. IMPRESSION: No acute cardiopulmonary disease. Mild stable cardiomegaly. Aortic Atherosclerosis (ICD10-I70.0). Electronically Signed   By: Marin Olp M.D.   On: 11/03/2016 18:44   Ct Head Wo Contrast  Result Date: 11/03/2016 CLINICAL DATA:  Dizziness with ataxia EXAM: CT HEAD WITHOUT CONTRAST TECHNIQUE: Contiguous axial images were obtained from the base of the skull through the vertex without intravenous contrast. COMPARISON:  MRI 01/19/2017, CT brain 01/28/2010 FINDINGS: Brain: No acute territorial infarction, hemorrhage or intracranial mass is seen. Mild small vessel ischemic changes of the white matter. Old lacunar infarct or dilated perivascular space in the left periatrial white matter. Ventricles are nonenlarged Vascular: No hyperdense vessels. Scattered calcifications at the carotid siphons Skull: No fracture.  Mastoid air cells are clear. Sinuses/Orbits: Mild mucosal thickening in the ethmoid sinuses. Small fluid level in the right maxillary sinus. No acute orbital abnormality Other: None IMPRESSION: 1. No CT evidence for acute intracranial abnormality. 2. Sinus disease. 3. Mild small vessel ischemic changes of the white matter. Electronically Signed   By: Donavan Foil M.D.   On:  11/03/2016 19:05    Procedures Procedures (including critical care time)  Medications Ordered in ED Medications  0.9 %  sodium chloride infusion (not administered)  0.9 %  sodium chloride infusion ( Intravenous New Bag/Given 11/03/16 1928)  sodium chloride 0.9 % bolus 250 mL (0 mLs Intravenous Stopped 11/03/16 2049)     Initial Impression / Assessment and Plan / ED Course  I have reviewed the triage vital signs and the nursing notes.  Pertinent labs & imaging results that were available during my care of the patient were reviewed by me and considered in my medical decision making (see chart for details).    Workup here in the emergency department without any acute findings. No leukocytosis. Chest x-ray negative for congestive heart failure or pneumonia. Urinalysis negative for urinary tract infection. Patient does have a fever low-grade. No evidence of sepsis.  Patient was ambulated on her oxygen and did fine. Also patient without any true vertigo. Suspect this is viral type infection may be upper respiratory infection. Patient stable for discharge home. Patient will return for any new or worse symptoms. Patient will be treated symptomatically.    Final Clinical Impressions(s) / ED Diagnoses   Final diagnoses:  Dizziness  Viral upper respiratory tract infection  Fever, unspecified fever cause    New Prescriptions New Prescriptions   No medications on file     Fredia Sorrow, MD 11/03/16 2311

## 2016-11-03 NOTE — ED Notes (Signed)
Pt in CT.

## 2016-11-05 ENCOUNTER — Other Ambulatory Visit: Payer: Self-pay | Admitting: Neurology

## 2016-11-05 DIAGNOSIS — G6189 Other inflammatory polyneuropathies: Secondary | ICD-10-CM

## 2016-11-05 DIAGNOSIS — G6181 Chronic inflammatory demyelinating polyneuritis: Secondary | ICD-10-CM

## 2016-11-05 DIAGNOSIS — E1340 Other specified diabetes mellitus with diabetic neuropathy, unspecified: Secondary | ICD-10-CM

## 2016-11-07 DIAGNOSIS — I10 Essential (primary) hypertension: Secondary | ICD-10-CM | POA: Diagnosis not present

## 2016-11-07 DIAGNOSIS — G610 Guillain-Barre syndrome: Secondary | ICD-10-CM | POA: Diagnosis not present

## 2016-11-07 DIAGNOSIS — G6181 Chronic inflammatory demyelinating polyneuritis: Secondary | ICD-10-CM | POA: Diagnosis not present

## 2016-11-07 DIAGNOSIS — E1165 Type 2 diabetes mellitus with hyperglycemia: Secondary | ICD-10-CM | POA: Diagnosis not present

## 2016-11-07 DIAGNOSIS — K859 Acute pancreatitis without necrosis or infection, unspecified: Secondary | ICD-10-CM | POA: Diagnosis not present

## 2016-11-07 DIAGNOSIS — F4321 Adjustment disorder with depressed mood: Secondary | ICD-10-CM | POA: Diagnosis not present

## 2016-11-07 DIAGNOSIS — R918 Other nonspecific abnormal finding of lung field: Secondary | ICD-10-CM | POA: Diagnosis not present

## 2016-11-07 DIAGNOSIS — I517 Cardiomegaly: Secondary | ICD-10-CM | POA: Diagnosis not present

## 2016-11-07 DIAGNOSIS — Z8551 Personal history of malignant neoplasm of bladder: Secondary | ICD-10-CM | POA: Diagnosis not present

## 2016-11-07 DIAGNOSIS — R269 Unspecified abnormalities of gait and mobility: Secondary | ICD-10-CM | POA: Diagnosis not present

## 2016-11-07 DIAGNOSIS — Z794 Long term (current) use of insulin: Secondary | ICD-10-CM | POA: Diagnosis not present

## 2016-11-07 DIAGNOSIS — E785 Hyperlipidemia, unspecified: Secondary | ICD-10-CM | POA: Diagnosis not present

## 2016-11-07 DIAGNOSIS — I129 Hypertensive chronic kidney disease with stage 1 through stage 4 chronic kidney disease, or unspecified chronic kidney disease: Secondary | ICD-10-CM | POA: Diagnosis not present

## 2016-11-07 DIAGNOSIS — K8591 Acute pancreatitis with uninfected necrosis, unspecified: Secondary | ICD-10-CM | POA: Diagnosis not present

## 2016-11-07 DIAGNOSIS — R0902 Hypoxemia: Secondary | ICD-10-CM | POA: Diagnosis not present

## 2016-11-07 DIAGNOSIS — R938 Abnormal findings on diagnostic imaging of other specified body structures: Secondary | ICD-10-CM | POA: Diagnosis not present

## 2016-11-07 DIAGNOSIS — M792 Neuralgia and neuritis, unspecified: Secondary | ICD-10-CM | POA: Diagnosis not present

## 2016-11-07 DIAGNOSIS — M6281 Muscle weakness (generalized): Secondary | ICD-10-CM | POA: Diagnosis not present

## 2016-11-07 DIAGNOSIS — N189 Chronic kidney disease, unspecified: Secondary | ICD-10-CM | POA: Diagnosis not present

## 2016-11-07 DIAGNOSIS — I130 Hypertensive heart and chronic kidney disease with heart failure and stage 1 through stage 4 chronic kidney disease, or unspecified chronic kidney disease: Secondary | ICD-10-CM | POA: Diagnosis not present

## 2016-11-07 DIAGNOSIS — I340 Nonrheumatic mitral (valve) insufficiency: Secondary | ICD-10-CM | POA: Diagnosis not present

## 2016-11-07 DIAGNOSIS — I5030 Unspecified diastolic (congestive) heart failure: Secondary | ICD-10-CM | POA: Diagnosis not present

## 2016-11-07 DIAGNOSIS — R6881 Early satiety: Secondary | ICD-10-CM | POA: Diagnosis not present

## 2016-11-07 DIAGNOSIS — Z85528 Personal history of other malignant neoplasm of kidney: Secondary | ICD-10-CM | POA: Diagnosis not present

## 2016-11-07 DIAGNOSIS — J9811 Atelectasis: Secondary | ICD-10-CM | POA: Diagnosis not present

## 2016-11-07 DIAGNOSIS — G4733 Obstructive sleep apnea (adult) (pediatric): Secondary | ICD-10-CM | POA: Diagnosis not present

## 2016-11-07 DIAGNOSIS — N179 Acute kidney failure, unspecified: Secondary | ICD-10-CM | POA: Diagnosis not present

## 2016-11-07 DIAGNOSIS — G9349 Other encephalopathy: Secondary | ICD-10-CM | POA: Diagnosis not present

## 2016-11-07 DIAGNOSIS — R0602 Shortness of breath: Secondary | ICD-10-CM | POA: Diagnosis not present

## 2016-11-07 DIAGNOSIS — N170 Acute kidney failure with tubular necrosis: Secondary | ICD-10-CM | POA: Diagnosis not present

## 2016-11-07 DIAGNOSIS — K8500 Idiopathic acute pancreatitis without necrosis or infection: Secondary | ICD-10-CM | POA: Diagnosis not present

## 2016-11-07 DIAGNOSIS — N182 Chronic kidney disease, stage 2 (mild): Secondary | ICD-10-CM | POA: Diagnosis not present

## 2016-11-07 DIAGNOSIS — E1122 Type 2 diabetes mellitus with diabetic chronic kidney disease: Secondary | ICD-10-CM | POA: Diagnosis not present

## 2016-11-07 DIAGNOSIS — Z905 Acquired absence of kidney: Secondary | ICD-10-CM | POA: Diagnosis not present

## 2016-11-07 DIAGNOSIS — Q600 Renal agenesis, unilateral: Secondary | ICD-10-CM | POA: Diagnosis not present

## 2016-11-16 DIAGNOSIS — N186 End stage renal disease: Secondary | ICD-10-CM | POA: Diagnosis not present

## 2016-11-16 DIAGNOSIS — M6281 Muscle weakness (generalized): Secondary | ICD-10-CM | POA: Diagnosis not present

## 2016-11-16 DIAGNOSIS — N183 Chronic kidney disease, stage 3 (moderate): Secondary | ICD-10-CM | POA: Diagnosis not present

## 2016-11-16 DIAGNOSIS — I130 Hypertensive heart and chronic kidney disease with heart failure and stage 1 through stage 4 chronic kidney disease, or unspecified chronic kidney disease: Secondary | ICD-10-CM | POA: Diagnosis not present

## 2016-11-16 DIAGNOSIS — K8500 Idiopathic acute pancreatitis without necrosis or infection: Secondary | ICD-10-CM | POA: Diagnosis not present

## 2016-11-16 DIAGNOSIS — R079 Chest pain, unspecified: Secondary | ICD-10-CM | POA: Diagnosis not present

## 2016-11-16 DIAGNOSIS — I2510 Atherosclerotic heart disease of native coronary artery without angina pectoris: Secondary | ICD-10-CM | POA: Diagnosis not present

## 2016-11-16 DIAGNOSIS — R269 Unspecified abnormalities of gait and mobility: Secondary | ICD-10-CM | POA: Diagnosis not present

## 2016-11-16 DIAGNOSIS — K2990 Gastroduodenitis, unspecified, without bleeding: Secondary | ICD-10-CM | POA: Diagnosis not present

## 2016-11-16 DIAGNOSIS — Z85528 Personal history of other malignant neoplasm of kidney: Secondary | ICD-10-CM | POA: Diagnosis not present

## 2016-11-16 DIAGNOSIS — Z992 Dependence on renal dialysis: Secondary | ICD-10-CM | POA: Diagnosis not present

## 2016-11-16 DIAGNOSIS — R6521 Severe sepsis with septic shock: Secondary | ICD-10-CM | POA: Diagnosis not present

## 2016-11-16 DIAGNOSIS — I517 Cardiomegaly: Secondary | ICD-10-CM | POA: Diagnosis not present

## 2016-11-16 DIAGNOSIS — K859 Acute pancreatitis without necrosis or infection, unspecified: Secondary | ICD-10-CM | POA: Diagnosis not present

## 2016-11-16 DIAGNOSIS — A4159 Other Gram-negative sepsis: Secondary | ICD-10-CM | POA: Diagnosis not present

## 2016-11-16 DIAGNOSIS — Z794 Long term (current) use of insulin: Secondary | ICD-10-CM | POA: Diagnosis not present

## 2016-11-16 DIAGNOSIS — E1122 Type 2 diabetes mellitus with diabetic chronic kidney disease: Secondary | ICD-10-CM | POA: Diagnosis not present

## 2016-11-16 DIAGNOSIS — M792 Neuralgia and neuritis, unspecified: Secondary | ICD-10-CM | POA: Diagnosis not present

## 2016-11-16 DIAGNOSIS — J9622 Acute and chronic respiratory failure with hypercapnia: Secondary | ICD-10-CM | POA: Diagnosis not present

## 2016-11-16 DIAGNOSIS — G6181 Chronic inflammatory demyelinating polyneuritis: Secondary | ICD-10-CM | POA: Diagnosis not present

## 2016-11-16 DIAGNOSIS — E43 Unspecified severe protein-calorie malnutrition: Secondary | ICD-10-CM | POA: Diagnosis not present

## 2016-11-16 DIAGNOSIS — N179 Acute kidney failure, unspecified: Secondary | ICD-10-CM | POA: Diagnosis not present

## 2016-11-16 DIAGNOSIS — Q600 Renal agenesis, unilateral: Secondary | ICD-10-CM | POA: Diagnosis not present

## 2016-11-16 DIAGNOSIS — Z79891 Long term (current) use of opiate analgesic: Secondary | ICD-10-CM | POA: Diagnosis not present

## 2016-11-16 DIAGNOSIS — I5033 Acute on chronic diastolic (congestive) heart failure: Secondary | ICD-10-CM | POA: Diagnosis not present

## 2016-11-16 DIAGNOSIS — R109 Unspecified abdominal pain: Secondary | ICD-10-CM | POA: Diagnosis not present

## 2016-11-16 DIAGNOSIS — G610 Guillain-Barre syndrome: Secondary | ICD-10-CM | POA: Diagnosis not present

## 2016-11-16 DIAGNOSIS — E1165 Type 2 diabetes mellitus with hyperglycemia: Secondary | ICD-10-CM | POA: Diagnosis not present

## 2016-11-16 DIAGNOSIS — J811 Chronic pulmonary edema: Secondary | ICD-10-CM | POA: Diagnosis not present

## 2016-11-16 DIAGNOSIS — E785 Hyperlipidemia, unspecified: Secondary | ICD-10-CM | POA: Diagnosis not present

## 2016-11-16 DIAGNOSIS — R1013 Epigastric pain: Secondary | ICD-10-CM | POA: Diagnosis not present

## 2016-11-16 DIAGNOSIS — I480 Paroxysmal atrial fibrillation: Secondary | ICD-10-CM | POA: Diagnosis not present

## 2016-11-16 DIAGNOSIS — I5030 Unspecified diastolic (congestive) heart failure: Secondary | ICD-10-CM | POA: Diagnosis not present

## 2016-11-16 DIAGNOSIS — I129 Hypertensive chronic kidney disease with stage 1 through stage 4 chronic kidney disease, or unspecified chronic kidney disease: Secondary | ICD-10-CM | POA: Diagnosis not present

## 2016-11-16 DIAGNOSIS — I5032 Chronic diastolic (congestive) heart failure: Secondary | ICD-10-CM | POA: Diagnosis not present

## 2016-11-16 DIAGNOSIS — F4321 Adjustment disorder with depressed mood: Secondary | ICD-10-CM | POA: Diagnosis not present

## 2016-11-16 DIAGNOSIS — E11649 Type 2 diabetes mellitus with hypoglycemia without coma: Secondary | ICD-10-CM | POA: Diagnosis not present

## 2016-11-16 DIAGNOSIS — I10 Essential (primary) hypertension: Secondary | ICD-10-CM | POA: Diagnosis not present

## 2016-11-16 DIAGNOSIS — N189 Chronic kidney disease, unspecified: Secondary | ICD-10-CM | POA: Diagnosis not present

## 2016-11-16 DIAGNOSIS — I110 Hypertensive heart disease with heart failure: Secondary | ICD-10-CM | POA: Diagnosis not present

## 2016-11-16 DIAGNOSIS — N390 Urinary tract infection, site not specified: Secondary | ICD-10-CM | POA: Diagnosis not present

## 2016-11-16 DIAGNOSIS — E1140 Type 2 diabetes mellitus with diabetic neuropathy, unspecified: Secondary | ICD-10-CM | POA: Diagnosis not present

## 2016-11-19 DIAGNOSIS — I2510 Atherosclerotic heart disease of native coronary artery without angina pectoris: Secondary | ICD-10-CM | POA: Diagnosis not present

## 2016-11-19 DIAGNOSIS — K859 Acute pancreatitis without necrosis or infection, unspecified: Secondary | ICD-10-CM | POA: Diagnosis not present

## 2016-11-19 DIAGNOSIS — I5030 Unspecified diastolic (congestive) heart failure: Secondary | ICD-10-CM | POA: Diagnosis not present

## 2016-11-19 DIAGNOSIS — I130 Hypertensive heart and chronic kidney disease with heart failure and stage 1 through stage 4 chronic kidney disease, or unspecified chronic kidney disease: Secondary | ICD-10-CM | POA: Diagnosis not present

## 2016-11-19 DIAGNOSIS — N183 Chronic kidney disease, stage 3 (moderate): Secondary | ICD-10-CM | POA: Diagnosis not present

## 2016-11-21 DIAGNOSIS — I519 Heart disease, unspecified: Secondary | ICD-10-CM | POA: Diagnosis not present

## 2016-11-21 DIAGNOSIS — E1165 Type 2 diabetes mellitus with hyperglycemia: Secondary | ICD-10-CM | POA: Diagnosis not present

## 2016-11-21 DIAGNOSIS — J811 Chronic pulmonary edema: Secondary | ICD-10-CM | POA: Diagnosis not present

## 2016-11-21 DIAGNOSIS — R9431 Abnormal electrocardiogram [ECG] [EKG]: Secondary | ICD-10-CM | POA: Diagnosis not present

## 2016-11-21 DIAGNOSIS — N179 Acute kidney failure, unspecified: Secondary | ICD-10-CM | POA: Diagnosis not present

## 2016-11-21 DIAGNOSIS — R410 Disorientation, unspecified: Secondary | ICD-10-CM | POA: Diagnosis not present

## 2016-11-21 DIAGNOSIS — Z992 Dependence on renal dialysis: Secondary | ICD-10-CM | POA: Diagnosis not present

## 2016-11-21 DIAGNOSIS — R339 Retention of urine, unspecified: Secondary | ICD-10-CM | POA: Diagnosis not present

## 2016-11-21 DIAGNOSIS — R400 Somnolence: Secondary | ICD-10-CM | POA: Diagnosis not present

## 2016-11-21 DIAGNOSIS — E1122 Type 2 diabetes mellitus with diabetic chronic kidney disease: Secondary | ICD-10-CM | POA: Diagnosis not present

## 2016-11-21 DIAGNOSIS — E119 Type 2 diabetes mellitus without complications: Secondary | ICD-10-CM | POA: Diagnosis not present

## 2016-11-21 DIAGNOSIS — E8779 Other fluid overload: Secondary | ICD-10-CM | POA: Diagnosis not present

## 2016-11-21 DIAGNOSIS — K2990 Gastroduodenitis, unspecified, without bleeding: Secondary | ICD-10-CM | POA: Diagnosis not present

## 2016-11-21 DIAGNOSIS — I481 Persistent atrial fibrillation: Secondary | ICD-10-CM | POA: Diagnosis not present

## 2016-11-21 DIAGNOSIS — K859 Acute pancreatitis without necrosis or infection, unspecified: Secondary | ICD-10-CM | POA: Diagnosis not present

## 2016-11-21 DIAGNOSIS — Z794 Long term (current) use of insulin: Secondary | ICD-10-CM | POA: Diagnosis not present

## 2016-11-21 DIAGNOSIS — E43 Unspecified severe protein-calorie malnutrition: Secondary | ICD-10-CM | POA: Diagnosis not present

## 2016-11-21 DIAGNOSIS — I110 Hypertensive heart disease with heart failure: Secondary | ICD-10-CM | POA: Diagnosis not present

## 2016-11-21 DIAGNOSIS — N186 End stage renal disease: Secondary | ICD-10-CM | POA: Diagnosis not present

## 2016-11-21 DIAGNOSIS — E11649 Type 2 diabetes mellitus with hypoglycemia without coma: Secondary | ICD-10-CM | POA: Diagnosis not present

## 2016-11-21 DIAGNOSIS — N189 Chronic kidney disease, unspecified: Secondary | ICD-10-CM | POA: Diagnosis not present

## 2016-11-21 DIAGNOSIS — Z452 Encounter for adjustment and management of vascular access device: Secondary | ICD-10-CM | POA: Diagnosis not present

## 2016-11-21 DIAGNOSIS — I517 Cardiomegaly: Secondary | ICD-10-CM | POA: Diagnosis not present

## 2016-11-21 DIAGNOSIS — Z9911 Dependence on respirator [ventilator] status: Secondary | ICD-10-CM | POA: Diagnosis not present

## 2016-11-21 DIAGNOSIS — A419 Sepsis, unspecified organism: Secondary | ICD-10-CM | POA: Diagnosis not present

## 2016-11-21 DIAGNOSIS — I5032 Chronic diastolic (congestive) heart failure: Secondary | ICD-10-CM | POA: Diagnosis not present

## 2016-11-21 DIAGNOSIS — I5020 Unspecified systolic (congestive) heart failure: Secondary | ICD-10-CM | POA: Diagnosis not present

## 2016-11-21 DIAGNOSIS — N390 Urinary tract infection, site not specified: Secondary | ICD-10-CM | POA: Diagnosis not present

## 2016-11-21 DIAGNOSIS — I130 Hypertensive heart and chronic kidney disease with heart failure and stage 1 through stage 4 chronic kidney disease, or unspecified chronic kidney disease: Secondary | ICD-10-CM | POA: Diagnosis not present

## 2016-11-21 DIAGNOSIS — G610 Guillain-Barre syndrome: Secondary | ICD-10-CM | POA: Diagnosis not present

## 2016-11-21 DIAGNOSIS — I340 Nonrheumatic mitral (valve) insufficiency: Secondary | ICD-10-CM | POA: Diagnosis not present

## 2016-11-21 DIAGNOSIS — I4891 Unspecified atrial fibrillation: Secondary | ICD-10-CM | POA: Diagnosis not present

## 2016-11-21 DIAGNOSIS — R079 Chest pain, unspecified: Secondary | ICD-10-CM | POA: Diagnosis not present

## 2016-11-21 DIAGNOSIS — R6521 Severe sepsis with septic shock: Secondary | ICD-10-CM | POA: Diagnosis not present

## 2016-11-21 DIAGNOSIS — Q600 Renal agenesis, unilateral: Secondary | ICD-10-CM | POA: Diagnosis not present

## 2016-11-21 DIAGNOSIS — J181 Lobar pneumonia, unspecified organism: Secondary | ICD-10-CM | POA: Diagnosis not present

## 2016-11-21 DIAGNOSIS — R918 Other nonspecific abnormal finding of lung field: Secondary | ICD-10-CM | POA: Diagnosis not present

## 2016-11-21 DIAGNOSIS — R5381 Other malaise: Secondary | ICD-10-CM | POA: Diagnosis not present

## 2016-11-21 DIAGNOSIS — R1013 Epigastric pain: Secondary | ICD-10-CM | POA: Diagnosis not present

## 2016-11-21 DIAGNOSIS — K8591 Acute pancreatitis with uninfected necrosis, unspecified: Secondary | ICD-10-CM | POA: Diagnosis not present

## 2016-11-21 DIAGNOSIS — Z9989 Dependence on other enabling machines and devices: Secondary | ICD-10-CM | POA: Diagnosis not present

## 2016-11-21 DIAGNOSIS — E162 Hypoglycemia, unspecified: Secondary | ICD-10-CM | POA: Diagnosis not present

## 2016-11-21 DIAGNOSIS — G729 Myopathy, unspecified: Secondary | ICD-10-CM | POA: Diagnosis not present

## 2016-11-21 DIAGNOSIS — N250 Renal osteodystrophy: Secondary | ICD-10-CM | POA: Diagnosis not present

## 2016-11-21 DIAGNOSIS — G910 Communicating hydrocephalus: Secondary | ICD-10-CM | POA: Diagnosis not present

## 2016-11-21 DIAGNOSIS — I509 Heart failure, unspecified: Secondary | ICD-10-CM | POA: Diagnosis not present

## 2016-11-21 DIAGNOSIS — I480 Paroxysmal atrial fibrillation: Secondary | ICD-10-CM | POA: Diagnosis not present

## 2016-11-21 DIAGNOSIS — R942 Abnormal results of pulmonary function studies: Secondary | ICD-10-CM | POA: Diagnosis not present

## 2016-11-21 DIAGNOSIS — Z79891 Long term (current) use of opiate analgesic: Secondary | ICD-10-CM | POA: Diagnosis not present

## 2016-11-21 DIAGNOSIS — R001 Bradycardia, unspecified: Secondary | ICD-10-CM | POA: Diagnosis not present

## 2016-11-21 DIAGNOSIS — K8500 Idiopathic acute pancreatitis without necrosis or infection: Secondary | ICD-10-CM | POA: Diagnosis not present

## 2016-11-21 DIAGNOSIS — Z9889 Other specified postprocedural states: Secondary | ICD-10-CM | POA: Diagnosis not present

## 2016-11-21 DIAGNOSIS — I82612 Acute embolism and thrombosis of superficial veins of left upper extremity: Secondary | ICD-10-CM | POA: Diagnosis not present

## 2016-11-21 DIAGNOSIS — R112 Nausea with vomiting, unspecified: Secondary | ICD-10-CM | POA: Diagnosis not present

## 2016-11-21 DIAGNOSIS — I10 Essential (primary) hypertension: Secondary | ICD-10-CM | POA: Diagnosis not present

## 2016-11-21 DIAGNOSIS — J9602 Acute respiratory failure with hypercapnia: Secondary | ICD-10-CM | POA: Diagnosis not present

## 2016-11-21 DIAGNOSIS — I700 Atherosclerosis of aorta: Secondary | ICD-10-CM | POA: Diagnosis not present

## 2016-11-21 DIAGNOSIS — E1140 Type 2 diabetes mellitus with diabetic neuropathy, unspecified: Secondary | ICD-10-CM | POA: Diagnosis not present

## 2016-11-21 DIAGNOSIS — D631 Anemia in chronic kidney disease: Secondary | ICD-10-CM | POA: Diagnosis not present

## 2016-11-21 DIAGNOSIS — J9601 Acute respiratory failure with hypoxia: Secondary | ICD-10-CM | POA: Diagnosis not present

## 2016-11-21 DIAGNOSIS — J9622 Acute and chronic respiratory failure with hypercapnia: Secondary | ICD-10-CM | POA: Diagnosis not present

## 2016-11-21 DIAGNOSIS — B961 Klebsiella pneumoniae [K. pneumoniae] as the cause of diseases classified elsewhere: Secondary | ICD-10-CM | POA: Diagnosis not present

## 2016-11-21 DIAGNOSIS — Z4901 Encounter for fitting and adjustment of extracorporeal dialysis catheter: Secondary | ICD-10-CM | POA: Diagnosis not present

## 2016-11-21 DIAGNOSIS — K5900 Constipation, unspecified: Secondary | ICD-10-CM | POA: Diagnosis not present

## 2016-11-21 DIAGNOSIS — N185 Chronic kidney disease, stage 5: Secondary | ICD-10-CM | POA: Diagnosis not present

## 2016-11-21 DIAGNOSIS — J9620 Acute and chronic respiratory failure, unspecified whether with hypoxia or hypercapnia: Secondary | ICD-10-CM | POA: Diagnosis not present

## 2016-11-21 DIAGNOSIS — E118 Type 2 diabetes mellitus with unspecified complications: Secondary | ICD-10-CM | POA: Diagnosis not present

## 2016-11-21 DIAGNOSIS — Z85528 Personal history of other malignant neoplasm of kidney: Secondary | ICD-10-CM | POA: Diagnosis not present

## 2016-11-21 DIAGNOSIS — D649 Anemia, unspecified: Secondary | ICD-10-CM | POA: Diagnosis not present

## 2016-11-21 DIAGNOSIS — I5033 Acute on chronic diastolic (congestive) heart failure: Secondary | ICD-10-CM | POA: Diagnosis not present

## 2016-11-21 DIAGNOSIS — A4159 Other Gram-negative sepsis: Secondary | ICD-10-CM | POA: Diagnosis not present

## 2016-11-21 DIAGNOSIS — J9692 Respiratory failure, unspecified with hypercapnia: Secondary | ICD-10-CM | POA: Diagnosis not present

## 2016-11-21 DIAGNOSIS — R109 Unspecified abdominal pain: Secondary | ICD-10-CM | POA: Diagnosis not present

## 2016-11-21 DIAGNOSIS — E8770 Fluid overload, unspecified: Secondary | ICD-10-CM | POA: Diagnosis not present

## 2016-11-21 DIAGNOSIS — R0989 Other specified symptoms and signs involving the circulatory and respiratory systems: Secondary | ICD-10-CM | POA: Diagnosis not present

## 2016-11-21 DIAGNOSIS — R531 Weakness: Secondary | ICD-10-CM | POA: Diagnosis not present

## 2016-11-21 DIAGNOSIS — J90 Pleural effusion, not elsewhere classified: Secondary | ICD-10-CM | POA: Diagnosis not present

## 2016-11-21 DIAGNOSIS — R0902 Hypoxemia: Secondary | ICD-10-CM | POA: Diagnosis not present

## 2016-11-21 DIAGNOSIS — J9811 Atelectasis: Secondary | ICD-10-CM | POA: Diagnosis not present

## 2016-11-21 DIAGNOSIS — R0600 Dyspnea, unspecified: Secondary | ICD-10-CM | POA: Diagnosis not present

## 2016-11-21 DIAGNOSIS — I5030 Unspecified diastolic (congestive) heart failure: Secondary | ICD-10-CM | POA: Diagnosis not present

## 2016-11-21 DIAGNOSIS — R930 Abnormal findings on diagnostic imaging of skull and head, not elsewhere classified: Secondary | ICD-10-CM | POA: Diagnosis not present

## 2016-11-23 ENCOUNTER — Telehealth: Payer: Self-pay | Admitting: *Deleted

## 2016-11-23 NOTE — Telephone Encounter (Signed)
Received Physician Orders from [facility] for [facility] Oxygen; forwarded to provider/SLS 10/12

## 2017-01-28 ENCOUNTER — Other Ambulatory Visit: Payer: Medicare Other

## 2017-02-28 DIAGNOSIS — J9602 Acute respiratory failure with hypercapnia: Secondary | ICD-10-CM | POA: Diagnosis not present

## 2017-02-28 DIAGNOSIS — D649 Anemia, unspecified: Secondary | ICD-10-CM | POA: Diagnosis not present

## 2017-02-28 DIAGNOSIS — E43 Unspecified severe protein-calorie malnutrition: Secondary | ICD-10-CM | POA: Diagnosis not present

## 2017-02-28 DIAGNOSIS — I5032 Chronic diastolic (congestive) heart failure: Secondary | ICD-10-CM | POA: Diagnosis not present

## 2017-02-28 DIAGNOSIS — G8929 Other chronic pain: Secondary | ICD-10-CM | POA: Diagnosis not present

## 2017-02-28 DIAGNOSIS — J9601 Acute respiratory failure with hypoxia: Secondary | ICD-10-CM | POA: Diagnosis not present

## 2017-02-28 DIAGNOSIS — J9811 Atelectasis: Secondary | ICD-10-CM | POA: Diagnosis not present

## 2017-02-28 DIAGNOSIS — I509 Heart failure, unspecified: Secondary | ICD-10-CM | POA: Diagnosis not present

## 2017-02-28 DIAGNOSIS — E1122 Type 2 diabetes mellitus with diabetic chronic kidney disease: Secondary | ICD-10-CM | POA: Diagnosis not present

## 2017-02-28 DIAGNOSIS — J9612 Chronic respiratory failure with hypercapnia: Secondary | ICD-10-CM | POA: Diagnosis not present

## 2017-02-28 DIAGNOSIS — R001 Bradycardia, unspecified: Secondary | ICD-10-CM | POA: Diagnosis not present

## 2017-02-28 DIAGNOSIS — H9201 Otalgia, right ear: Secondary | ICD-10-CM | POA: Diagnosis not present

## 2017-02-28 DIAGNOSIS — J90 Pleural effusion, not elsewhere classified: Secondary | ICD-10-CM | POA: Diagnosis not present

## 2017-02-28 DIAGNOSIS — I4891 Unspecified atrial fibrillation: Secondary | ICD-10-CM | POA: Diagnosis not present

## 2017-02-28 DIAGNOSIS — E1140 Type 2 diabetes mellitus with diabetic neuropathy, unspecified: Secondary | ICD-10-CM | POA: Diagnosis not present

## 2017-02-28 DIAGNOSIS — N185 Chronic kidney disease, stage 5: Secondary | ICD-10-CM | POA: Diagnosis not present

## 2017-02-28 DIAGNOSIS — I499 Cardiac arrhythmia, unspecified: Secondary | ICD-10-CM | POA: Diagnosis not present

## 2017-02-28 DIAGNOSIS — R944 Abnormal results of kidney function studies: Secondary | ICD-10-CM | POA: Diagnosis not present

## 2017-02-28 DIAGNOSIS — I10 Essential (primary) hypertension: Secondary | ICD-10-CM | POA: Diagnosis not present

## 2017-02-28 DIAGNOSIS — G610 Guillain-Barre syndrome: Secondary | ICD-10-CM | POA: Diagnosis not present

## 2017-02-28 DIAGNOSIS — Z992 Dependence on renal dialysis: Secondary | ICD-10-CM | POA: Diagnosis not present

## 2017-02-28 DIAGNOSIS — K859 Acute pancreatitis without necrosis or infection, unspecified: Secondary | ICD-10-CM | POA: Diagnosis not present

## 2017-02-28 DIAGNOSIS — N182 Chronic kidney disease, stage 2 (mild): Secondary | ICD-10-CM | POA: Diagnosis not present

## 2017-02-28 DIAGNOSIS — E875 Hyperkalemia: Secondary | ICD-10-CM | POA: Diagnosis not present

## 2017-02-28 DIAGNOSIS — Z85528 Personal history of other malignant neoplasm of kidney: Secondary | ICD-10-CM | POA: Diagnosis not present

## 2017-02-28 DIAGNOSIS — N179 Acute kidney failure, unspecified: Secondary | ICD-10-CM | POA: Diagnosis not present

## 2017-02-28 DIAGNOSIS — I130 Hypertensive heart and chronic kidney disease with heart failure and stage 1 through stage 4 chronic kidney disease, or unspecified chronic kidney disease: Secondary | ICD-10-CM | POA: Diagnosis not present

## 2017-02-28 DIAGNOSIS — Z905 Acquired absence of kidney: Secondary | ICD-10-CM | POA: Diagnosis not present

## 2017-02-28 DIAGNOSIS — N186 End stage renal disease: Secondary | ICD-10-CM | POA: Diagnosis not present

## 2017-02-28 DIAGNOSIS — Z794 Long term (current) use of insulin: Secondary | ICD-10-CM | POA: Diagnosis not present

## 2017-02-28 DIAGNOSIS — J9622 Acute and chronic respiratory failure with hypercapnia: Secondary | ICD-10-CM | POA: Diagnosis not present

## 2017-03-11 ENCOUNTER — Encounter: Payer: Self-pay | Admitting: Family Medicine

## 2017-03-11 MED ORDER — ALBUMIN HUMAN 25 % IV SOLN
25.00 | INTRAVENOUS | Status: DC
Start: ? — End: 2017-03-11

## 2017-03-11 MED ORDER — GENERIC EXTERNAL MEDICATION
2.10 | Status: DC
Start: ? — End: 2017-03-11

## 2017-03-11 MED ORDER — TRAMADOL HCL 50 MG PO TABS
50.00 mg | ORAL_TABLET | ORAL | Status: DC
Start: ? — End: 2017-03-11

## 2017-03-11 MED ORDER — GENERIC EXTERNAL MEDICATION
Status: DC
Start: ? — End: 2017-03-11

## 2017-03-11 MED ORDER — INSULIN LISPRO 100 UNIT/ML ~~LOC~~ SOLN
3.00 | SUBCUTANEOUS | Status: DC
Start: 2017-03-12 — End: 2017-03-11

## 2017-03-11 MED ORDER — LIDOCAINE HCL 2 % EX GEL
CUTANEOUS | Status: DC
Start: ? — End: 2017-03-11

## 2017-03-11 MED ORDER — FLUTICASONE PROPIONATE 50 MCG/ACT NA SUSP
2.00 | NASAL | Status: DC
Start: ? — End: 2017-03-11

## 2017-03-11 MED ORDER — GABAPENTIN 100 MG PO CAPS
100.00 mg | ORAL_CAPSULE | ORAL | Status: DC
Start: 2017-03-12 — End: 2017-03-11

## 2017-03-11 MED ORDER — GENERIC EXTERNAL MEDICATION
30.00 | Status: DC
Start: ? — End: 2017-03-11

## 2017-03-11 MED ORDER — DEXTROSE 50 % IV SOLN
12.50 | INTRAVENOUS | Status: DC
Start: ? — End: 2017-03-11

## 2017-03-11 MED ORDER — GENERIC EXTERNAL MEDICATION
2.00 | Status: DC
Start: ? — End: 2017-03-11

## 2017-03-11 MED ORDER — LIDOCAINE 5 % EX PTCH
1.00 | MEDICATED_PATCH | CUTANEOUS | Status: DC
Start: 2017-03-12 — End: 2017-03-11

## 2017-03-11 MED ORDER — GENERIC EXTERNAL MEDICATION
1.00 | Status: DC
Start: ? — End: 2017-03-11

## 2017-03-11 MED ORDER — PANTOPRAZOLE SODIUM 40 MG PO TBEC
40.00 mg | DELAYED_RELEASE_TABLET | ORAL | Status: DC
Start: 2017-03-12 — End: 2017-03-11

## 2017-03-11 MED ORDER — THIAMINE HCL 100 MG PO TABS
100.00 mg | ORAL_TABLET | ORAL | Status: DC
Start: 2017-03-12 — End: 2017-03-11

## 2017-03-11 MED ORDER — ONDANSETRON 4 MG PO TBDP
4.00 mg | ORAL_TABLET | ORAL | Status: DC
Start: ? — End: 2017-03-11

## 2017-03-11 MED ORDER — INSULIN LISPRO 100 UNIT/ML ~~LOC~~ SOLN
0.00 | SUBCUTANEOUS | Status: DC
Start: 2017-03-11 — End: 2017-03-11

## 2017-03-11 MED ORDER — PROCHLORPERAZINE MALEATE 5 MG PO TABS
5.00 mg | ORAL_TABLET | ORAL | Status: DC
Start: ? — End: 2017-03-11

## 2017-03-11 MED ORDER — CHOLECALCIFEROL 25 MCG (1000 UT) PO TABS
1000.00 | ORAL_TABLET | ORAL | Status: DC
Start: 2017-03-12 — End: 2017-03-11

## 2017-03-11 MED ORDER — MUPIROCIN CALCIUM 2 % EX CREA
TOPICAL_CREAM | CUTANEOUS | Status: DC
Start: ? — End: 2017-03-11

## 2017-03-11 MED ORDER — CETIRIZINE HCL 5 MG PO TABS
5.00 mg | ORAL_TABLET | ORAL | Status: DC
Start: 2017-03-12 — End: 2017-03-11

## 2017-03-11 MED ORDER — SENNOSIDES 8.6 MG PO TABS
1.00 | ORAL_TABLET | ORAL | Status: DC
Start: ? — End: 2017-03-11

## 2017-03-11 MED ORDER — APIXABAN 5 MG PO TABS
5.00 mg | ORAL_TABLET | ORAL | Status: DC
Start: 2017-03-11 — End: 2017-03-11

## 2017-03-11 MED ORDER — GENERIC EXTERNAL MEDICATION
1.00 | Status: DC
Start: 2017-03-12 — End: 2017-03-11

## 2017-03-11 MED ORDER — POLYETHYLENE GLYCOL 3350 17 G PO PACK
17.00 | PACK | ORAL | Status: DC
Start: ? — End: 2017-03-11

## 2017-03-11 MED ORDER — SEVELAMER CARBONATE 800 MG PO TABS
800.00 mg | ORAL_TABLET | ORAL | Status: DC
Start: 2017-03-12 — End: 2017-03-11

## 2017-03-11 MED ORDER — BISACODYL 10 MG RE SUPP
10.00 mg | RECTAL | Status: DC
Start: ? — End: 2017-03-11

## 2017-03-11 MED ORDER — LISINOPRIL 5 MG PO TABS
2.50 mg | ORAL_TABLET | ORAL | Status: DC
Start: 2017-03-12 — End: 2017-03-11

## 2017-03-11 MED ORDER — AMLODIPINE BESYLATE 5 MG PO TABS
2.50 mg | ORAL_TABLET | ORAL | Status: DC
Start: 2017-03-12 — End: 2017-03-11

## 2017-03-11 MED ORDER — DRONEDARONE HCL 400 MG PO TABS
400.00 mg | ORAL_TABLET | ORAL | Status: DC
Start: 2017-03-11 — End: 2017-03-11

## 2017-03-11 MED ORDER — GENERIC EXTERNAL MEDICATION
75.00 mg | Status: DC
Start: 2017-03-12 — End: 2017-03-11

## 2017-03-11 MED ORDER — HYDROXYZINE HCL 25 MG PO TABS
25.00 mg | ORAL_TABLET | ORAL | Status: DC
Start: 2017-03-11 — End: 2017-03-11

## 2017-03-11 MED ORDER — HYDRALAZINE HCL 10 MG PO TABS
10.00 mg | ORAL_TABLET | ORAL | Status: DC
Start: ? — End: 2017-03-11

## 2017-03-11 MED ORDER — GENERIC EXTERNAL MEDICATION
10.00 | Status: DC
Start: 2017-03-11 — End: 2017-03-11

## 2017-03-11 MED ORDER — GENERIC EXTERNAL MEDICATION
3.00 | Status: DC
Start: 2017-03-13 — End: 2017-03-11

## 2017-03-11 MED ORDER — MELATONIN 3 MG PO TABS
3.00 mg | ORAL_TABLET | ORAL | Status: DC
Start: 2017-03-11 — End: 2017-03-11

## 2017-03-11 MED ORDER — ATORVASTATIN CALCIUM 10 MG PO TABS
10.00 mg | ORAL_TABLET | ORAL | Status: DC
Start: 2017-03-12 — End: 2017-03-11

## 2017-03-11 NOTE — Telephone Encounter (Signed)
Appointment has been canceled.

## 2017-03-13 DIAGNOSIS — D689 Coagulation defect, unspecified: Secondary | ICD-10-CM | POA: Diagnosis not present

## 2017-03-13 DIAGNOSIS — N2581 Secondary hyperparathyroidism of renal origin: Secondary | ICD-10-CM | POA: Diagnosis not present

## 2017-03-13 DIAGNOSIS — N186 End stage renal disease: Secondary | ICD-10-CM | POA: Diagnosis not present

## 2017-03-14 DIAGNOSIS — N186 End stage renal disease: Secondary | ICD-10-CM | POA: Diagnosis not present

## 2017-03-14 DIAGNOSIS — Z905 Acquired absence of kidney: Secondary | ICD-10-CM | POA: Diagnosis not present

## 2017-03-14 DIAGNOSIS — J9612 Chronic respiratory failure with hypercapnia: Secondary | ICD-10-CM | POA: Diagnosis not present

## 2017-03-14 DIAGNOSIS — I132 Hypertensive heart and chronic kidney disease with heart failure and with stage 5 chronic kidney disease, or end stage renal disease: Secondary | ICD-10-CM | POA: Diagnosis not present

## 2017-03-14 DIAGNOSIS — Z9981 Dependence on supplemental oxygen: Secondary | ICD-10-CM | POA: Diagnosis not present

## 2017-03-14 DIAGNOSIS — E1122 Type 2 diabetes mellitus with diabetic chronic kidney disease: Secondary | ICD-10-CM | POA: Diagnosis not present

## 2017-03-14 DIAGNOSIS — G610 Guillain-Barre syndrome: Secondary | ICD-10-CM | POA: Diagnosis not present

## 2017-03-14 DIAGNOSIS — I5032 Chronic diastolic (congestive) heart failure: Secondary | ICD-10-CM | POA: Diagnosis not present

## 2017-03-14 DIAGNOSIS — E43 Unspecified severe protein-calorie malnutrition: Secondary | ICD-10-CM | POA: Diagnosis not present

## 2017-03-14 DIAGNOSIS — Z794 Long term (current) use of insulin: Secondary | ICD-10-CM | POA: Diagnosis not present

## 2017-03-14 DIAGNOSIS — Z992 Dependence on renal dialysis: Secondary | ICD-10-CM | POA: Diagnosis not present

## 2017-03-14 DIAGNOSIS — E1140 Type 2 diabetes mellitus with diabetic neuropathy, unspecified: Secondary | ICD-10-CM | POA: Diagnosis not present

## 2017-03-14 DIAGNOSIS — Z7901 Long term (current) use of anticoagulants: Secondary | ICD-10-CM | POA: Diagnosis not present

## 2017-03-14 DIAGNOSIS — D631 Anemia in chronic kidney disease: Secondary | ICD-10-CM | POA: Diagnosis not present

## 2017-03-15 DIAGNOSIS — Z9981 Dependence on supplemental oxygen: Secondary | ICD-10-CM | POA: Diagnosis not present

## 2017-03-15 DIAGNOSIS — G610 Guillain-Barre syndrome: Secondary | ICD-10-CM | POA: Diagnosis not present

## 2017-03-15 DIAGNOSIS — J9612 Chronic respiratory failure with hypercapnia: Secondary | ICD-10-CM | POA: Diagnosis not present

## 2017-03-15 DIAGNOSIS — Z992 Dependence on renal dialysis: Secondary | ICD-10-CM | POA: Diagnosis not present

## 2017-03-15 DIAGNOSIS — N2581 Secondary hyperparathyroidism of renal origin: Secondary | ICD-10-CM | POA: Diagnosis not present

## 2017-03-15 DIAGNOSIS — I132 Hypertensive heart and chronic kidney disease with heart failure and with stage 5 chronic kidney disease, or end stage renal disease: Secondary | ICD-10-CM | POA: Diagnosis not present

## 2017-03-15 DIAGNOSIS — N186 End stage renal disease: Secondary | ICD-10-CM | POA: Diagnosis not present

## 2017-03-15 DIAGNOSIS — D631 Anemia in chronic kidney disease: Secondary | ICD-10-CM | POA: Diagnosis not present

## 2017-03-15 DIAGNOSIS — E1140 Type 2 diabetes mellitus with diabetic neuropathy, unspecified: Secondary | ICD-10-CM | POA: Diagnosis not present

## 2017-03-15 DIAGNOSIS — D689 Coagulation defect, unspecified: Secondary | ICD-10-CM | POA: Diagnosis not present

## 2017-03-15 DIAGNOSIS — R509 Fever, unspecified: Secondary | ICD-10-CM | POA: Diagnosis not present

## 2017-03-15 DIAGNOSIS — I5032 Chronic diastolic (congestive) heart failure: Secondary | ICD-10-CM | POA: Diagnosis not present

## 2017-03-15 DIAGNOSIS — E43 Unspecified severe protein-calorie malnutrition: Secondary | ICD-10-CM | POA: Diagnosis not present

## 2017-03-15 DIAGNOSIS — E1122 Type 2 diabetes mellitus with diabetic chronic kidney disease: Secondary | ICD-10-CM | POA: Diagnosis not present

## 2017-03-15 DIAGNOSIS — Z905 Acquired absence of kidney: Secondary | ICD-10-CM | POA: Diagnosis not present

## 2017-03-15 DIAGNOSIS — Z794 Long term (current) use of insulin: Secondary | ICD-10-CM | POA: Diagnosis not present

## 2017-03-15 DIAGNOSIS — Z7901 Long term (current) use of anticoagulants: Secondary | ICD-10-CM | POA: Diagnosis not present

## 2017-03-18 ENCOUNTER — Inpatient Hospital Stay: Payer: Medicare Other | Admitting: Family Medicine

## 2017-03-18 DIAGNOSIS — R509 Fever, unspecified: Secondary | ICD-10-CM | POA: Diagnosis not present

## 2017-03-18 DIAGNOSIS — D631 Anemia in chronic kidney disease: Secondary | ICD-10-CM | POA: Diagnosis not present

## 2017-03-18 DIAGNOSIS — N186 End stage renal disease: Secondary | ICD-10-CM | POA: Diagnosis not present

## 2017-03-18 DIAGNOSIS — N2581 Secondary hyperparathyroidism of renal origin: Secondary | ICD-10-CM | POA: Diagnosis not present

## 2017-03-18 DIAGNOSIS — D689 Coagulation defect, unspecified: Secondary | ICD-10-CM | POA: Diagnosis not present

## 2017-03-19 DIAGNOSIS — Z9981 Dependence on supplemental oxygen: Secondary | ICD-10-CM | POA: Diagnosis not present

## 2017-03-19 DIAGNOSIS — Z794 Long term (current) use of insulin: Secondary | ICD-10-CM | POA: Diagnosis not present

## 2017-03-19 DIAGNOSIS — J9612 Chronic respiratory failure with hypercapnia: Secondary | ICD-10-CM | POA: Diagnosis not present

## 2017-03-19 DIAGNOSIS — E1122 Type 2 diabetes mellitus with diabetic chronic kidney disease: Secondary | ICD-10-CM | POA: Diagnosis not present

## 2017-03-19 DIAGNOSIS — E43 Unspecified severe protein-calorie malnutrition: Secondary | ICD-10-CM | POA: Diagnosis not present

## 2017-03-19 DIAGNOSIS — Z992 Dependence on renal dialysis: Secondary | ICD-10-CM | POA: Diagnosis not present

## 2017-03-19 DIAGNOSIS — I5032 Chronic diastolic (congestive) heart failure: Secondary | ICD-10-CM | POA: Diagnosis not present

## 2017-03-19 DIAGNOSIS — G610 Guillain-Barre syndrome: Secondary | ICD-10-CM | POA: Diagnosis not present

## 2017-03-19 DIAGNOSIS — N186 End stage renal disease: Secondary | ICD-10-CM | POA: Diagnosis not present

## 2017-03-19 DIAGNOSIS — D631 Anemia in chronic kidney disease: Secondary | ICD-10-CM | POA: Diagnosis not present

## 2017-03-19 DIAGNOSIS — I132 Hypertensive heart and chronic kidney disease with heart failure and with stage 5 chronic kidney disease, or end stage renal disease: Secondary | ICD-10-CM | POA: Diagnosis not present

## 2017-03-19 DIAGNOSIS — Z905 Acquired absence of kidney: Secondary | ICD-10-CM | POA: Diagnosis not present

## 2017-03-19 DIAGNOSIS — E1140 Type 2 diabetes mellitus with diabetic neuropathy, unspecified: Secondary | ICD-10-CM | POA: Diagnosis not present

## 2017-03-19 DIAGNOSIS — Z7901 Long term (current) use of anticoagulants: Secondary | ICD-10-CM | POA: Diagnosis not present

## 2017-03-20 DIAGNOSIS — R509 Fever, unspecified: Secondary | ICD-10-CM | POA: Diagnosis not present

## 2017-03-20 DIAGNOSIS — D689 Coagulation defect, unspecified: Secondary | ICD-10-CM | POA: Diagnosis not present

## 2017-03-20 DIAGNOSIS — D631 Anemia in chronic kidney disease: Secondary | ICD-10-CM | POA: Diagnosis not present

## 2017-03-20 DIAGNOSIS — N186 End stage renal disease: Secondary | ICD-10-CM | POA: Diagnosis not present

## 2017-03-20 DIAGNOSIS — I5033 Acute on chronic diastolic (congestive) heart failure: Secondary | ICD-10-CM | POA: Diagnosis not present

## 2017-03-20 DIAGNOSIS — N2581 Secondary hyperparathyroidism of renal origin: Secondary | ICD-10-CM | POA: Diagnosis not present

## 2017-03-21 DIAGNOSIS — E1140 Type 2 diabetes mellitus with diabetic neuropathy, unspecified: Secondary | ICD-10-CM | POA: Diagnosis not present

## 2017-03-21 DIAGNOSIS — E43 Unspecified severe protein-calorie malnutrition: Secondary | ICD-10-CM | POA: Diagnosis not present

## 2017-03-21 DIAGNOSIS — G610 Guillain-Barre syndrome: Secondary | ICD-10-CM | POA: Diagnosis not present

## 2017-03-21 DIAGNOSIS — I5032 Chronic diastolic (congestive) heart failure: Secondary | ICD-10-CM | POA: Diagnosis not present

## 2017-03-21 DIAGNOSIS — E1122 Type 2 diabetes mellitus with diabetic chronic kidney disease: Secondary | ICD-10-CM | POA: Diagnosis not present

## 2017-03-21 DIAGNOSIS — Z905 Acquired absence of kidney: Secondary | ICD-10-CM | POA: Diagnosis not present

## 2017-03-21 DIAGNOSIS — J9612 Chronic respiratory failure with hypercapnia: Secondary | ICD-10-CM | POA: Diagnosis not present

## 2017-03-21 DIAGNOSIS — N189 Chronic kidney disease, unspecified: Secondary | ICD-10-CM | POA: Diagnosis not present

## 2017-03-21 DIAGNOSIS — N186 End stage renal disease: Secondary | ICD-10-CM | POA: Diagnosis not present

## 2017-03-21 DIAGNOSIS — Z794 Long term (current) use of insulin: Secondary | ICD-10-CM | POA: Diagnosis not present

## 2017-03-21 DIAGNOSIS — J9610 Chronic respiratory failure, unspecified whether with hypoxia or hypercapnia: Secondary | ICD-10-CM | POA: Diagnosis not present

## 2017-03-21 DIAGNOSIS — Z9981 Dependence on supplemental oxygen: Secondary | ICD-10-CM | POA: Diagnosis not present

## 2017-03-21 DIAGNOSIS — N179 Acute kidney failure, unspecified: Secondary | ICD-10-CM | POA: Diagnosis not present

## 2017-03-21 DIAGNOSIS — Z992 Dependence on renal dialysis: Secondary | ICD-10-CM | POA: Diagnosis not present

## 2017-03-21 DIAGNOSIS — I132 Hypertensive heart and chronic kidney disease with heart failure and with stage 5 chronic kidney disease, or end stage renal disease: Secondary | ICD-10-CM | POA: Diagnosis not present

## 2017-03-21 DIAGNOSIS — Z7901 Long term (current) use of anticoagulants: Secondary | ICD-10-CM | POA: Diagnosis not present

## 2017-03-21 DIAGNOSIS — D631 Anemia in chronic kidney disease: Secondary | ICD-10-CM | POA: Diagnosis not present

## 2017-03-23 DIAGNOSIS — N2581 Secondary hyperparathyroidism of renal origin: Secondary | ICD-10-CM | POA: Diagnosis not present

## 2017-03-23 DIAGNOSIS — D631 Anemia in chronic kidney disease: Secondary | ICD-10-CM | POA: Diagnosis not present

## 2017-03-23 DIAGNOSIS — N186 End stage renal disease: Secondary | ICD-10-CM | POA: Diagnosis not present

## 2017-03-23 DIAGNOSIS — D689 Coagulation defect, unspecified: Secondary | ICD-10-CM | POA: Diagnosis not present

## 2017-03-23 DIAGNOSIS — R509 Fever, unspecified: Secondary | ICD-10-CM | POA: Diagnosis not present

## 2017-03-25 DIAGNOSIS — J9612 Chronic respiratory failure with hypercapnia: Secondary | ICD-10-CM | POA: Diagnosis not present

## 2017-03-25 DIAGNOSIS — Z794 Long term (current) use of insulin: Secondary | ICD-10-CM | POA: Diagnosis not present

## 2017-03-25 DIAGNOSIS — D631 Anemia in chronic kidney disease: Secondary | ICD-10-CM | POA: Diagnosis not present

## 2017-03-25 DIAGNOSIS — I132 Hypertensive heart and chronic kidney disease with heart failure and with stage 5 chronic kidney disease, or end stage renal disease: Secondary | ICD-10-CM | POA: Diagnosis not present

## 2017-03-25 DIAGNOSIS — Z7901 Long term (current) use of anticoagulants: Secondary | ICD-10-CM | POA: Diagnosis not present

## 2017-03-25 DIAGNOSIS — Z9981 Dependence on supplemental oxygen: Secondary | ICD-10-CM | POA: Diagnosis not present

## 2017-03-25 DIAGNOSIS — Z992 Dependence on renal dialysis: Secondary | ICD-10-CM | POA: Diagnosis not present

## 2017-03-25 DIAGNOSIS — E1122 Type 2 diabetes mellitus with diabetic chronic kidney disease: Secondary | ICD-10-CM | POA: Diagnosis not present

## 2017-03-25 DIAGNOSIS — Z905 Acquired absence of kidney: Secondary | ICD-10-CM | POA: Diagnosis not present

## 2017-03-25 DIAGNOSIS — E43 Unspecified severe protein-calorie malnutrition: Secondary | ICD-10-CM | POA: Diagnosis not present

## 2017-03-25 DIAGNOSIS — I5032 Chronic diastolic (congestive) heart failure: Secondary | ICD-10-CM | POA: Diagnosis not present

## 2017-03-25 DIAGNOSIS — N186 End stage renal disease: Secondary | ICD-10-CM | POA: Diagnosis not present

## 2017-03-25 DIAGNOSIS — E1140 Type 2 diabetes mellitus with diabetic neuropathy, unspecified: Secondary | ICD-10-CM | POA: Diagnosis not present

## 2017-03-25 DIAGNOSIS — G610 Guillain-Barre syndrome: Secondary | ICD-10-CM | POA: Diagnosis not present

## 2017-03-26 DIAGNOSIS — N186 End stage renal disease: Secondary | ICD-10-CM | POA: Diagnosis not present

## 2017-03-26 DIAGNOSIS — D689 Coagulation defect, unspecified: Secondary | ICD-10-CM | POA: Diagnosis not present

## 2017-03-26 DIAGNOSIS — N2581 Secondary hyperparathyroidism of renal origin: Secondary | ICD-10-CM | POA: Diagnosis not present

## 2017-03-26 DIAGNOSIS — R509 Fever, unspecified: Secondary | ICD-10-CM | POA: Diagnosis not present

## 2017-03-26 DIAGNOSIS — D631 Anemia in chronic kidney disease: Secondary | ICD-10-CM | POA: Diagnosis not present

## 2017-03-27 DIAGNOSIS — Z794 Long term (current) use of insulin: Secondary | ICD-10-CM | POA: Diagnosis not present

## 2017-03-27 DIAGNOSIS — I132 Hypertensive heart and chronic kidney disease with heart failure and with stage 5 chronic kidney disease, or end stage renal disease: Secondary | ICD-10-CM | POA: Diagnosis not present

## 2017-03-27 DIAGNOSIS — E1122 Type 2 diabetes mellitus with diabetic chronic kidney disease: Secondary | ICD-10-CM | POA: Diagnosis not present

## 2017-03-27 DIAGNOSIS — E43 Unspecified severe protein-calorie malnutrition: Secondary | ICD-10-CM | POA: Diagnosis not present

## 2017-03-27 DIAGNOSIS — Z9981 Dependence on supplemental oxygen: Secondary | ICD-10-CM | POA: Diagnosis not present

## 2017-03-27 DIAGNOSIS — N186 End stage renal disease: Secondary | ICD-10-CM | POA: Diagnosis not present

## 2017-03-27 DIAGNOSIS — Z905 Acquired absence of kidney: Secondary | ICD-10-CM | POA: Diagnosis not present

## 2017-03-27 DIAGNOSIS — Z7901 Long term (current) use of anticoagulants: Secondary | ICD-10-CM | POA: Diagnosis not present

## 2017-03-27 DIAGNOSIS — J9612 Chronic respiratory failure with hypercapnia: Secondary | ICD-10-CM | POA: Diagnosis not present

## 2017-03-27 DIAGNOSIS — I5032 Chronic diastolic (congestive) heart failure: Secondary | ICD-10-CM | POA: Diagnosis not present

## 2017-03-27 DIAGNOSIS — Z992 Dependence on renal dialysis: Secondary | ICD-10-CM | POA: Diagnosis not present

## 2017-03-27 DIAGNOSIS — D631 Anemia in chronic kidney disease: Secondary | ICD-10-CM | POA: Diagnosis not present

## 2017-03-27 DIAGNOSIS — G610 Guillain-Barre syndrome: Secondary | ICD-10-CM | POA: Diagnosis not present

## 2017-03-27 DIAGNOSIS — E1140 Type 2 diabetes mellitus with diabetic neuropathy, unspecified: Secondary | ICD-10-CM | POA: Diagnosis not present

## 2017-03-28 DIAGNOSIS — N186 End stage renal disease: Secondary | ICD-10-CM | POA: Diagnosis not present

## 2017-03-28 DIAGNOSIS — D689 Coagulation defect, unspecified: Secondary | ICD-10-CM | POA: Diagnosis not present

## 2017-03-28 DIAGNOSIS — N2581 Secondary hyperparathyroidism of renal origin: Secondary | ICD-10-CM | POA: Diagnosis not present

## 2017-03-28 DIAGNOSIS — R509 Fever, unspecified: Secondary | ICD-10-CM | POA: Diagnosis not present

## 2017-03-28 DIAGNOSIS — D631 Anemia in chronic kidney disease: Secondary | ICD-10-CM | POA: Diagnosis not present

## 2017-03-29 DIAGNOSIS — Z905 Acquired absence of kidney: Secondary | ICD-10-CM | POA: Diagnosis not present

## 2017-03-29 DIAGNOSIS — D631 Anemia in chronic kidney disease: Secondary | ICD-10-CM | POA: Diagnosis not present

## 2017-03-29 DIAGNOSIS — Z7901 Long term (current) use of anticoagulants: Secondary | ICD-10-CM | POA: Diagnosis not present

## 2017-03-29 DIAGNOSIS — N186 End stage renal disease: Secondary | ICD-10-CM | POA: Diagnosis not present

## 2017-03-29 DIAGNOSIS — Z794 Long term (current) use of insulin: Secondary | ICD-10-CM | POA: Diagnosis not present

## 2017-03-29 DIAGNOSIS — E1122 Type 2 diabetes mellitus with diabetic chronic kidney disease: Secondary | ICD-10-CM | POA: Diagnosis not present

## 2017-03-29 DIAGNOSIS — I5032 Chronic diastolic (congestive) heart failure: Secondary | ICD-10-CM | POA: Diagnosis not present

## 2017-03-29 DIAGNOSIS — J9612 Chronic respiratory failure with hypercapnia: Secondary | ICD-10-CM | POA: Diagnosis not present

## 2017-03-29 DIAGNOSIS — E43 Unspecified severe protein-calorie malnutrition: Secondary | ICD-10-CM | POA: Diagnosis not present

## 2017-03-29 DIAGNOSIS — E1140 Type 2 diabetes mellitus with diabetic neuropathy, unspecified: Secondary | ICD-10-CM | POA: Diagnosis not present

## 2017-03-29 DIAGNOSIS — Z9981 Dependence on supplemental oxygen: Secondary | ICD-10-CM | POA: Diagnosis not present

## 2017-03-29 DIAGNOSIS — I132 Hypertensive heart and chronic kidney disease with heart failure and with stage 5 chronic kidney disease, or end stage renal disease: Secondary | ICD-10-CM | POA: Diagnosis not present

## 2017-03-29 DIAGNOSIS — Z992 Dependence on renal dialysis: Secondary | ICD-10-CM | POA: Diagnosis not present

## 2017-03-29 DIAGNOSIS — G610 Guillain-Barre syndrome: Secondary | ICD-10-CM | POA: Diagnosis not present

## 2017-03-30 ENCOUNTER — Encounter: Payer: Self-pay | Admitting: Neurology

## 2017-03-30 DIAGNOSIS — N2581 Secondary hyperparathyroidism of renal origin: Secondary | ICD-10-CM | POA: Diagnosis not present

## 2017-03-30 DIAGNOSIS — N186 End stage renal disease: Secondary | ICD-10-CM | POA: Diagnosis not present

## 2017-03-30 DIAGNOSIS — D689 Coagulation defect, unspecified: Secondary | ICD-10-CM | POA: Diagnosis not present

## 2017-03-30 DIAGNOSIS — R509 Fever, unspecified: Secondary | ICD-10-CM | POA: Diagnosis not present

## 2017-03-30 DIAGNOSIS — D631 Anemia in chronic kidney disease: Secondary | ICD-10-CM | POA: Diagnosis not present

## 2017-04-01 DIAGNOSIS — G610 Guillain-Barre syndrome: Secondary | ICD-10-CM | POA: Diagnosis not present

## 2017-04-01 DIAGNOSIS — I132 Hypertensive heart and chronic kidney disease with heart failure and with stage 5 chronic kidney disease, or end stage renal disease: Secondary | ICD-10-CM | POA: Diagnosis not present

## 2017-04-01 DIAGNOSIS — I5032 Chronic diastolic (congestive) heart failure: Secondary | ICD-10-CM | POA: Diagnosis not present

## 2017-04-01 DIAGNOSIS — Z7901 Long term (current) use of anticoagulants: Secondary | ICD-10-CM | POA: Diagnosis not present

## 2017-04-01 DIAGNOSIS — N186 End stage renal disease: Secondary | ICD-10-CM | POA: Diagnosis not present

## 2017-04-01 DIAGNOSIS — J9612 Chronic respiratory failure with hypercapnia: Secondary | ICD-10-CM | POA: Diagnosis not present

## 2017-04-01 DIAGNOSIS — E1122 Type 2 diabetes mellitus with diabetic chronic kidney disease: Secondary | ICD-10-CM | POA: Diagnosis not present

## 2017-04-01 DIAGNOSIS — E1140 Type 2 diabetes mellitus with diabetic neuropathy, unspecified: Secondary | ICD-10-CM | POA: Diagnosis not present

## 2017-04-01 DIAGNOSIS — Z9981 Dependence on supplemental oxygen: Secondary | ICD-10-CM | POA: Diagnosis not present

## 2017-04-01 DIAGNOSIS — D631 Anemia in chronic kidney disease: Secondary | ICD-10-CM | POA: Diagnosis not present

## 2017-04-01 DIAGNOSIS — E43 Unspecified severe protein-calorie malnutrition: Secondary | ICD-10-CM | POA: Diagnosis not present

## 2017-04-01 DIAGNOSIS — Z905 Acquired absence of kidney: Secondary | ICD-10-CM | POA: Diagnosis not present

## 2017-04-01 DIAGNOSIS — Z794 Long term (current) use of insulin: Secondary | ICD-10-CM | POA: Diagnosis not present

## 2017-04-01 DIAGNOSIS — Z992 Dependence on renal dialysis: Secondary | ICD-10-CM | POA: Diagnosis not present

## 2017-04-02 DIAGNOSIS — N2581 Secondary hyperparathyroidism of renal origin: Secondary | ICD-10-CM | POA: Diagnosis not present

## 2017-04-02 DIAGNOSIS — D689 Coagulation defect, unspecified: Secondary | ICD-10-CM | POA: Diagnosis not present

## 2017-04-02 DIAGNOSIS — D631 Anemia in chronic kidney disease: Secondary | ICD-10-CM | POA: Diagnosis not present

## 2017-04-02 DIAGNOSIS — R509 Fever, unspecified: Secondary | ICD-10-CM | POA: Diagnosis not present

## 2017-04-02 DIAGNOSIS — N186 End stage renal disease: Secondary | ICD-10-CM | POA: Diagnosis not present

## 2017-04-03 ENCOUNTER — Ambulatory Visit: Payer: Medicare Other | Admitting: Neurology

## 2017-04-03 DIAGNOSIS — I132 Hypertensive heart and chronic kidney disease with heart failure and with stage 5 chronic kidney disease, or end stage renal disease: Secondary | ICD-10-CM | POA: Diagnosis not present

## 2017-04-03 DIAGNOSIS — Z794 Long term (current) use of insulin: Secondary | ICD-10-CM | POA: Diagnosis not present

## 2017-04-03 DIAGNOSIS — D631 Anemia in chronic kidney disease: Secondary | ICD-10-CM | POA: Diagnosis not present

## 2017-04-03 DIAGNOSIS — Z905 Acquired absence of kidney: Secondary | ICD-10-CM | POA: Diagnosis not present

## 2017-04-03 DIAGNOSIS — N186 End stage renal disease: Secondary | ICD-10-CM | POA: Diagnosis not present

## 2017-04-03 DIAGNOSIS — I5032 Chronic diastolic (congestive) heart failure: Secondary | ICD-10-CM | POA: Diagnosis not present

## 2017-04-03 DIAGNOSIS — R509 Fever, unspecified: Secondary | ICD-10-CM | POA: Diagnosis not present

## 2017-04-03 DIAGNOSIS — E43 Unspecified severe protein-calorie malnutrition: Secondary | ICD-10-CM | POA: Diagnosis not present

## 2017-04-03 DIAGNOSIS — E1140 Type 2 diabetes mellitus with diabetic neuropathy, unspecified: Secondary | ICD-10-CM | POA: Diagnosis not present

## 2017-04-03 DIAGNOSIS — J9612 Chronic respiratory failure with hypercapnia: Secondary | ICD-10-CM | POA: Diagnosis not present

## 2017-04-03 DIAGNOSIS — E1122 Type 2 diabetes mellitus with diabetic chronic kidney disease: Secondary | ICD-10-CM | POA: Diagnosis not present

## 2017-04-03 DIAGNOSIS — G610 Guillain-Barre syndrome: Secondary | ICD-10-CM | POA: Diagnosis not present

## 2017-04-03 DIAGNOSIS — Z9981 Dependence on supplemental oxygen: Secondary | ICD-10-CM | POA: Diagnosis not present

## 2017-04-03 DIAGNOSIS — Z7901 Long term (current) use of anticoagulants: Secondary | ICD-10-CM | POA: Diagnosis not present

## 2017-04-03 DIAGNOSIS — Z992 Dependence on renal dialysis: Secondary | ICD-10-CM | POA: Diagnosis not present

## 2017-04-05 DIAGNOSIS — Z8551 Personal history of malignant neoplasm of bladder: Secondary | ICD-10-CM | POA: Diagnosis not present

## 2017-04-05 DIAGNOSIS — Z992 Dependence on renal dialysis: Secondary | ICD-10-CM | POA: Diagnosis not present

## 2017-04-05 DIAGNOSIS — D649 Anemia, unspecified: Secondary | ICD-10-CM | POA: Diagnosis not present

## 2017-04-05 DIAGNOSIS — B370 Candidal stomatitis: Secondary | ICD-10-CM | POA: Diagnosis not present

## 2017-04-05 DIAGNOSIS — N186 End stage renal disease: Secondary | ICD-10-CM | POA: Diagnosis not present

## 2017-04-05 DIAGNOSIS — R110 Nausea: Secondary | ICD-10-CM | POA: Diagnosis not present

## 2017-04-05 DIAGNOSIS — Z7901 Long term (current) use of anticoagulants: Secondary | ICD-10-CM | POA: Diagnosis not present

## 2017-04-05 DIAGNOSIS — I132 Hypertensive heart and chronic kidney disease with heart failure and with stage 5 chronic kidney disease, or end stage renal disease: Secondary | ICD-10-CM | POA: Diagnosis not present

## 2017-04-05 DIAGNOSIS — Z905 Acquired absence of kidney: Secondary | ICD-10-CM | POA: Diagnosis not present

## 2017-04-05 DIAGNOSIS — N250 Renal osteodystrophy: Secondary | ICD-10-CM | POA: Diagnosis not present

## 2017-04-05 DIAGNOSIS — J189 Pneumonia, unspecified organism: Secondary | ICD-10-CM | POA: Diagnosis not present

## 2017-04-05 DIAGNOSIS — R509 Fever, unspecified: Secondary | ICD-10-CM | POA: Diagnosis not present

## 2017-04-05 DIAGNOSIS — Z9981 Dependence on supplemental oxygen: Secondary | ICD-10-CM | POA: Diagnosis not present

## 2017-04-05 DIAGNOSIS — I509 Heart failure, unspecified: Secondary | ICD-10-CM | POA: Diagnosis not present

## 2017-04-05 DIAGNOSIS — I5033 Acute on chronic diastolic (congestive) heart failure: Secondary | ICD-10-CM | POA: Diagnosis not present

## 2017-04-05 DIAGNOSIS — R05 Cough: Secondary | ICD-10-CM | POA: Diagnosis not present

## 2017-04-05 DIAGNOSIS — E1122 Type 2 diabetes mellitus with diabetic chronic kidney disease: Secondary | ICD-10-CM | POA: Diagnosis not present

## 2017-04-05 DIAGNOSIS — I4891 Unspecified atrial fibrillation: Secondary | ICD-10-CM | POA: Diagnosis not present

## 2017-04-05 DIAGNOSIS — Z794 Long term (current) use of insulin: Secondary | ICD-10-CM | POA: Diagnosis not present

## 2017-04-05 DIAGNOSIS — R638 Other symptoms and signs concerning food and fluid intake: Secondary | ICD-10-CM | POA: Diagnosis not present

## 2017-04-05 DIAGNOSIS — I120 Hypertensive chronic kidney disease with stage 5 chronic kidney disease or end stage renal disease: Secondary | ICD-10-CM | POA: Diagnosis not present

## 2017-04-05 DIAGNOSIS — Z85528 Personal history of other malignant neoplasm of kidney: Secondary | ICD-10-CM | POA: Diagnosis not present

## 2017-04-05 DIAGNOSIS — J069 Acute upper respiratory infection, unspecified: Secondary | ICD-10-CM | POA: Diagnosis not present

## 2017-04-05 DIAGNOSIS — D72829 Elevated white blood cell count, unspecified: Secondary | ICD-10-CM | POA: Diagnosis not present

## 2017-04-11 DIAGNOSIS — E1122 Type 2 diabetes mellitus with diabetic chronic kidney disease: Secondary | ICD-10-CM | POA: Diagnosis not present

## 2017-04-11 DIAGNOSIS — Z992 Dependence on renal dialysis: Secondary | ICD-10-CM | POA: Diagnosis not present

## 2017-04-11 DIAGNOSIS — N186 End stage renal disease: Secondary | ICD-10-CM | POA: Diagnosis not present

## 2017-04-12 DIAGNOSIS — Z992 Dependence on renal dialysis: Secondary | ICD-10-CM | POA: Diagnosis not present

## 2017-04-12 DIAGNOSIS — E43 Unspecified severe protein-calorie malnutrition: Secondary | ICD-10-CM | POA: Diagnosis not present

## 2017-04-12 DIAGNOSIS — Z905 Acquired absence of kidney: Secondary | ICD-10-CM | POA: Diagnosis not present

## 2017-04-12 DIAGNOSIS — E1122 Type 2 diabetes mellitus with diabetic chronic kidney disease: Secondary | ICD-10-CM | POA: Diagnosis not present

## 2017-04-12 DIAGNOSIS — Z9981 Dependence on supplemental oxygen: Secondary | ICD-10-CM | POA: Diagnosis not present

## 2017-04-12 DIAGNOSIS — Z794 Long term (current) use of insulin: Secondary | ICD-10-CM | POA: Diagnosis not present

## 2017-04-12 DIAGNOSIS — D631 Anemia in chronic kidney disease: Secondary | ICD-10-CM | POA: Diagnosis not present

## 2017-04-12 DIAGNOSIS — E1140 Type 2 diabetes mellitus with diabetic neuropathy, unspecified: Secondary | ICD-10-CM | POA: Diagnosis not present

## 2017-04-12 DIAGNOSIS — J9612 Chronic respiratory failure with hypercapnia: Secondary | ICD-10-CM | POA: Diagnosis not present

## 2017-04-12 DIAGNOSIS — I132 Hypertensive heart and chronic kidney disease with heart failure and with stage 5 chronic kidney disease, or end stage renal disease: Secondary | ICD-10-CM | POA: Diagnosis not present

## 2017-04-12 DIAGNOSIS — I5032 Chronic diastolic (congestive) heart failure: Secondary | ICD-10-CM | POA: Diagnosis not present

## 2017-04-12 DIAGNOSIS — N186 End stage renal disease: Secondary | ICD-10-CM | POA: Diagnosis not present

## 2017-04-12 DIAGNOSIS — G610 Guillain-Barre syndrome: Secondary | ICD-10-CM | POA: Diagnosis not present

## 2017-04-12 DIAGNOSIS — Z7901 Long term (current) use of anticoagulants: Secondary | ICD-10-CM | POA: Diagnosis not present

## 2017-04-12 DIAGNOSIS — R110 Nausea: Secondary | ICD-10-CM | POA: Diagnosis not present

## 2017-04-13 DIAGNOSIS — N2581 Secondary hyperparathyroidism of renal origin: Secondary | ICD-10-CM | POA: Diagnosis not present

## 2017-04-13 DIAGNOSIS — T80211A Bloodstream infection due to central venous catheter, initial encounter: Secondary | ICD-10-CM | POA: Diagnosis not present

## 2017-04-13 DIAGNOSIS — D631 Anemia in chronic kidney disease: Secondary | ICD-10-CM | POA: Diagnosis not present

## 2017-04-13 DIAGNOSIS — N186 End stage renal disease: Secondary | ICD-10-CM | POA: Diagnosis not present

## 2017-04-13 DIAGNOSIS — D689 Coagulation defect, unspecified: Secondary | ICD-10-CM | POA: Diagnosis not present

## 2017-04-15 DIAGNOSIS — D631 Anemia in chronic kidney disease: Secondary | ICD-10-CM | POA: Diagnosis not present

## 2017-04-15 DIAGNOSIS — Z7901 Long term (current) use of anticoagulants: Secondary | ICD-10-CM | POA: Diagnosis not present

## 2017-04-15 DIAGNOSIS — N186 End stage renal disease: Secondary | ICD-10-CM | POA: Diagnosis not present

## 2017-04-15 DIAGNOSIS — G610 Guillain-Barre syndrome: Secondary | ICD-10-CM | POA: Diagnosis not present

## 2017-04-15 DIAGNOSIS — Z905 Acquired absence of kidney: Secondary | ICD-10-CM | POA: Diagnosis not present

## 2017-04-15 DIAGNOSIS — J9612 Chronic respiratory failure with hypercapnia: Secondary | ICD-10-CM | POA: Diagnosis not present

## 2017-04-15 DIAGNOSIS — E43 Unspecified severe protein-calorie malnutrition: Secondary | ICD-10-CM | POA: Diagnosis not present

## 2017-04-15 DIAGNOSIS — E1140 Type 2 diabetes mellitus with diabetic neuropathy, unspecified: Secondary | ICD-10-CM | POA: Diagnosis not present

## 2017-04-15 DIAGNOSIS — I5032 Chronic diastolic (congestive) heart failure: Secondary | ICD-10-CM | POA: Diagnosis not present

## 2017-04-15 DIAGNOSIS — Z794 Long term (current) use of insulin: Secondary | ICD-10-CM | POA: Diagnosis not present

## 2017-04-15 DIAGNOSIS — I132 Hypertensive heart and chronic kidney disease with heart failure and with stage 5 chronic kidney disease, or end stage renal disease: Secondary | ICD-10-CM | POA: Diagnosis not present

## 2017-04-15 DIAGNOSIS — E1122 Type 2 diabetes mellitus with diabetic chronic kidney disease: Secondary | ICD-10-CM | POA: Diagnosis not present

## 2017-04-15 DIAGNOSIS — Z9981 Dependence on supplemental oxygen: Secondary | ICD-10-CM | POA: Diagnosis not present

## 2017-04-15 DIAGNOSIS — Z992 Dependence on renal dialysis: Secondary | ICD-10-CM | POA: Diagnosis not present

## 2017-04-17 DIAGNOSIS — I132 Hypertensive heart and chronic kidney disease with heart failure and with stage 5 chronic kidney disease, or end stage renal disease: Secondary | ICD-10-CM | POA: Diagnosis not present

## 2017-04-17 DIAGNOSIS — J9612 Chronic respiratory failure with hypercapnia: Secondary | ICD-10-CM | POA: Diagnosis not present

## 2017-04-17 DIAGNOSIS — I5033 Acute on chronic diastolic (congestive) heart failure: Secondary | ICD-10-CM | POA: Diagnosis not present

## 2017-04-17 DIAGNOSIS — I5032 Chronic diastolic (congestive) heart failure: Secondary | ICD-10-CM | POA: Diagnosis not present

## 2017-04-17 DIAGNOSIS — G610 Guillain-Barre syndrome: Secondary | ICD-10-CM | POA: Diagnosis not present

## 2017-04-17 DIAGNOSIS — Z794 Long term (current) use of insulin: Secondary | ICD-10-CM | POA: Diagnosis not present

## 2017-04-17 DIAGNOSIS — N186 End stage renal disease: Secondary | ICD-10-CM | POA: Diagnosis not present

## 2017-04-17 DIAGNOSIS — Z992 Dependence on renal dialysis: Secondary | ICD-10-CM | POA: Diagnosis not present

## 2017-04-17 DIAGNOSIS — E43 Unspecified severe protein-calorie malnutrition: Secondary | ICD-10-CM | POA: Diagnosis not present

## 2017-04-17 DIAGNOSIS — E1122 Type 2 diabetes mellitus with diabetic chronic kidney disease: Secondary | ICD-10-CM | POA: Diagnosis not present

## 2017-04-17 DIAGNOSIS — E1140 Type 2 diabetes mellitus with diabetic neuropathy, unspecified: Secondary | ICD-10-CM | POA: Diagnosis not present

## 2017-04-17 DIAGNOSIS — D631 Anemia in chronic kidney disease: Secondary | ICD-10-CM | POA: Diagnosis not present

## 2017-04-17 DIAGNOSIS — Z9981 Dependence on supplemental oxygen: Secondary | ICD-10-CM | POA: Diagnosis not present

## 2017-04-17 DIAGNOSIS — Z7901 Long term (current) use of anticoagulants: Secondary | ICD-10-CM | POA: Diagnosis not present

## 2017-04-17 DIAGNOSIS — Z905 Acquired absence of kidney: Secondary | ICD-10-CM | POA: Diagnosis not present

## 2017-04-18 DIAGNOSIS — D689 Coagulation defect, unspecified: Secondary | ICD-10-CM | POA: Diagnosis not present

## 2017-04-18 DIAGNOSIS — N2581 Secondary hyperparathyroidism of renal origin: Secondary | ICD-10-CM | POA: Diagnosis not present

## 2017-04-18 DIAGNOSIS — N186 End stage renal disease: Secondary | ICD-10-CM | POA: Diagnosis not present

## 2017-04-18 DIAGNOSIS — D631 Anemia in chronic kidney disease: Secondary | ICD-10-CM | POA: Diagnosis not present

## 2017-04-18 DIAGNOSIS — J9610 Chronic respiratory failure, unspecified whether with hypoxia or hypercapnia: Secondary | ICD-10-CM | POA: Diagnosis not present

## 2017-04-18 DIAGNOSIS — E1122 Type 2 diabetes mellitus with diabetic chronic kidney disease: Secondary | ICD-10-CM | POA: Diagnosis not present

## 2017-04-18 DIAGNOSIS — T80211A Bloodstream infection due to central venous catheter, initial encounter: Secondary | ICD-10-CM | POA: Diagnosis not present

## 2017-04-18 DIAGNOSIS — G610 Guillain-Barre syndrome: Secondary | ICD-10-CM | POA: Diagnosis not present

## 2017-04-19 ENCOUNTER — Inpatient Hospital Stay: Payer: Medicare Other | Admitting: Family Medicine

## 2017-04-20 DIAGNOSIS — D689 Coagulation defect, unspecified: Secondary | ICD-10-CM | POA: Diagnosis not present

## 2017-04-20 DIAGNOSIS — T80211A Bloodstream infection due to central venous catheter, initial encounter: Secondary | ICD-10-CM | POA: Diagnosis not present

## 2017-04-20 DIAGNOSIS — N186 End stage renal disease: Secondary | ICD-10-CM | POA: Diagnosis not present

## 2017-04-20 DIAGNOSIS — D631 Anemia in chronic kidney disease: Secondary | ICD-10-CM | POA: Diagnosis not present

## 2017-04-20 DIAGNOSIS — N2581 Secondary hyperparathyroidism of renal origin: Secondary | ICD-10-CM | POA: Diagnosis not present

## 2017-04-22 DIAGNOSIS — Z7901 Long term (current) use of anticoagulants: Secondary | ICD-10-CM | POA: Diagnosis not present

## 2017-04-22 DIAGNOSIS — E1140 Type 2 diabetes mellitus with diabetic neuropathy, unspecified: Secondary | ICD-10-CM | POA: Diagnosis not present

## 2017-04-22 DIAGNOSIS — E43 Unspecified severe protein-calorie malnutrition: Secondary | ICD-10-CM | POA: Diagnosis not present

## 2017-04-22 DIAGNOSIS — D631 Anemia in chronic kidney disease: Secondary | ICD-10-CM | POA: Diagnosis not present

## 2017-04-22 DIAGNOSIS — G610 Guillain-Barre syndrome: Secondary | ICD-10-CM | POA: Diagnosis not present

## 2017-04-22 DIAGNOSIS — J9612 Chronic respiratory failure with hypercapnia: Secondary | ICD-10-CM | POA: Diagnosis not present

## 2017-04-22 DIAGNOSIS — Z905 Acquired absence of kidney: Secondary | ICD-10-CM | POA: Diagnosis not present

## 2017-04-22 DIAGNOSIS — I132 Hypertensive heart and chronic kidney disease with heart failure and with stage 5 chronic kidney disease, or end stage renal disease: Secondary | ICD-10-CM | POA: Diagnosis not present

## 2017-04-22 DIAGNOSIS — N186 End stage renal disease: Secondary | ICD-10-CM | POA: Diagnosis not present

## 2017-04-22 DIAGNOSIS — I5032 Chronic diastolic (congestive) heart failure: Secondary | ICD-10-CM | POA: Diagnosis not present

## 2017-04-22 DIAGNOSIS — Z992 Dependence on renal dialysis: Secondary | ICD-10-CM | POA: Diagnosis not present

## 2017-04-22 DIAGNOSIS — E1122 Type 2 diabetes mellitus with diabetic chronic kidney disease: Secondary | ICD-10-CM | POA: Diagnosis not present

## 2017-04-22 DIAGNOSIS — Z794 Long term (current) use of insulin: Secondary | ICD-10-CM | POA: Diagnosis not present

## 2017-04-22 DIAGNOSIS — Z9981 Dependence on supplemental oxygen: Secondary | ICD-10-CM | POA: Diagnosis not present

## 2017-04-23 DIAGNOSIS — D631 Anemia in chronic kidney disease: Secondary | ICD-10-CM | POA: Diagnosis not present

## 2017-04-23 DIAGNOSIS — N2581 Secondary hyperparathyroidism of renal origin: Secondary | ICD-10-CM | POA: Diagnosis not present

## 2017-04-23 DIAGNOSIS — T80211A Bloodstream infection due to central venous catheter, initial encounter: Secondary | ICD-10-CM | POA: Diagnosis not present

## 2017-04-23 DIAGNOSIS — N186 End stage renal disease: Secondary | ICD-10-CM | POA: Diagnosis not present

## 2017-04-23 DIAGNOSIS — D689 Coagulation defect, unspecified: Secondary | ICD-10-CM | POA: Diagnosis not present

## 2017-04-24 DIAGNOSIS — Z992 Dependence on renal dialysis: Secondary | ICD-10-CM | POA: Diagnosis not present

## 2017-04-24 DIAGNOSIS — Z7901 Long term (current) use of anticoagulants: Secondary | ICD-10-CM | POA: Diagnosis not present

## 2017-04-24 DIAGNOSIS — N186 End stage renal disease: Secondary | ICD-10-CM | POA: Diagnosis not present

## 2017-04-24 DIAGNOSIS — E1140 Type 2 diabetes mellitus with diabetic neuropathy, unspecified: Secondary | ICD-10-CM | POA: Diagnosis not present

## 2017-04-24 DIAGNOSIS — Z905 Acquired absence of kidney: Secondary | ICD-10-CM | POA: Diagnosis not present

## 2017-04-24 DIAGNOSIS — Z9981 Dependence on supplemental oxygen: Secondary | ICD-10-CM | POA: Diagnosis not present

## 2017-04-24 DIAGNOSIS — E43 Unspecified severe protein-calorie malnutrition: Secondary | ICD-10-CM | POA: Diagnosis not present

## 2017-04-24 DIAGNOSIS — Z794 Long term (current) use of insulin: Secondary | ICD-10-CM | POA: Diagnosis not present

## 2017-04-24 DIAGNOSIS — I5032 Chronic diastolic (congestive) heart failure: Secondary | ICD-10-CM | POA: Diagnosis not present

## 2017-04-24 DIAGNOSIS — D631 Anemia in chronic kidney disease: Secondary | ICD-10-CM | POA: Diagnosis not present

## 2017-04-24 DIAGNOSIS — G610 Guillain-Barre syndrome: Secondary | ICD-10-CM | POA: Diagnosis not present

## 2017-04-24 DIAGNOSIS — I132 Hypertensive heart and chronic kidney disease with heart failure and with stage 5 chronic kidney disease, or end stage renal disease: Secondary | ICD-10-CM | POA: Diagnosis not present

## 2017-04-24 DIAGNOSIS — E1122 Type 2 diabetes mellitus with diabetic chronic kidney disease: Secondary | ICD-10-CM | POA: Diagnosis not present

## 2017-04-24 DIAGNOSIS — J9612 Chronic respiratory failure with hypercapnia: Secondary | ICD-10-CM | POA: Diagnosis not present

## 2017-04-25 DIAGNOSIS — N2581 Secondary hyperparathyroidism of renal origin: Secondary | ICD-10-CM | POA: Diagnosis not present

## 2017-04-25 DIAGNOSIS — T80211A Bloodstream infection due to central venous catheter, initial encounter: Secondary | ICD-10-CM | POA: Diagnosis not present

## 2017-04-25 DIAGNOSIS — D689 Coagulation defect, unspecified: Secondary | ICD-10-CM | POA: Diagnosis not present

## 2017-04-25 DIAGNOSIS — D631 Anemia in chronic kidney disease: Secondary | ICD-10-CM | POA: Diagnosis not present

## 2017-04-25 DIAGNOSIS — N186 End stage renal disease: Secondary | ICD-10-CM | POA: Diagnosis not present

## 2017-04-27 DIAGNOSIS — D689 Coagulation defect, unspecified: Secondary | ICD-10-CM | POA: Diagnosis not present

## 2017-04-27 DIAGNOSIS — T80211A Bloodstream infection due to central venous catheter, initial encounter: Secondary | ICD-10-CM | POA: Diagnosis not present

## 2017-04-27 DIAGNOSIS — N186 End stage renal disease: Secondary | ICD-10-CM | POA: Diagnosis not present

## 2017-04-27 DIAGNOSIS — N2581 Secondary hyperparathyroidism of renal origin: Secondary | ICD-10-CM | POA: Diagnosis not present

## 2017-04-27 DIAGNOSIS — D631 Anemia in chronic kidney disease: Secondary | ICD-10-CM | POA: Diagnosis not present

## 2017-04-29 DIAGNOSIS — E43 Unspecified severe protein-calorie malnutrition: Secondary | ICD-10-CM | POA: Diagnosis not present

## 2017-04-29 DIAGNOSIS — I132 Hypertensive heart and chronic kidney disease with heart failure and with stage 5 chronic kidney disease, or end stage renal disease: Secondary | ICD-10-CM | POA: Diagnosis not present

## 2017-04-29 DIAGNOSIS — N186 End stage renal disease: Secondary | ICD-10-CM | POA: Diagnosis not present

## 2017-04-29 DIAGNOSIS — E1140 Type 2 diabetes mellitus with diabetic neuropathy, unspecified: Secondary | ICD-10-CM | POA: Diagnosis not present

## 2017-04-29 DIAGNOSIS — G610 Guillain-Barre syndrome: Secondary | ICD-10-CM | POA: Diagnosis not present

## 2017-04-29 DIAGNOSIS — D631 Anemia in chronic kidney disease: Secondary | ICD-10-CM | POA: Diagnosis not present

## 2017-04-29 DIAGNOSIS — Z7901 Long term (current) use of anticoagulants: Secondary | ICD-10-CM | POA: Diagnosis not present

## 2017-04-29 DIAGNOSIS — N3289 Other specified disorders of bladder: Secondary | ICD-10-CM | POA: Diagnosis not present

## 2017-04-29 DIAGNOSIS — Z794 Long term (current) use of insulin: Secondary | ICD-10-CM | POA: Diagnosis not present

## 2017-04-29 DIAGNOSIS — I4891 Unspecified atrial fibrillation: Secondary | ICD-10-CM | POA: Diagnosis not present

## 2017-04-29 DIAGNOSIS — I5032 Chronic diastolic (congestive) heart failure: Secondary | ICD-10-CM | POA: Diagnosis not present

## 2017-04-29 DIAGNOSIS — I120 Hypertensive chronic kidney disease with stage 5 chronic kidney disease or end stage renal disease: Secondary | ICD-10-CM | POA: Diagnosis not present

## 2017-04-29 DIAGNOSIS — Z8551 Personal history of malignant neoplasm of bladder: Secondary | ICD-10-CM | POA: Diagnosis not present

## 2017-04-29 DIAGNOSIS — H9201 Otalgia, right ear: Secondary | ICD-10-CM | POA: Diagnosis not present

## 2017-04-29 DIAGNOSIS — Z9981 Dependence on supplemental oxygen: Secondary | ICD-10-CM | POA: Diagnosis not present

## 2017-04-29 DIAGNOSIS — Z79899 Other long term (current) drug therapy: Secondary | ICD-10-CM | POA: Diagnosis not present

## 2017-04-29 DIAGNOSIS — Z905 Acquired absence of kidney: Secondary | ICD-10-CM | POA: Diagnosis not present

## 2017-04-29 DIAGNOSIS — Z992 Dependence on renal dialysis: Secondary | ICD-10-CM | POA: Diagnosis not present

## 2017-04-29 DIAGNOSIS — J9612 Chronic respiratory failure with hypercapnia: Secondary | ICD-10-CM | POA: Diagnosis not present

## 2017-04-29 DIAGNOSIS — R300 Dysuria: Secondary | ICD-10-CM | POA: Diagnosis not present

## 2017-04-29 DIAGNOSIS — E1122 Type 2 diabetes mellitus with diabetic chronic kidney disease: Secondary | ICD-10-CM | POA: Diagnosis not present

## 2017-04-30 DIAGNOSIS — T80211A Bloodstream infection due to central venous catheter, initial encounter: Secondary | ICD-10-CM | POA: Diagnosis not present

## 2017-04-30 DIAGNOSIS — N2581 Secondary hyperparathyroidism of renal origin: Secondary | ICD-10-CM | POA: Diagnosis not present

## 2017-04-30 DIAGNOSIS — D689 Coagulation defect, unspecified: Secondary | ICD-10-CM | POA: Diagnosis not present

## 2017-04-30 DIAGNOSIS — D631 Anemia in chronic kidney disease: Secondary | ICD-10-CM | POA: Diagnosis not present

## 2017-04-30 DIAGNOSIS — N186 End stage renal disease: Secondary | ICD-10-CM | POA: Diagnosis not present

## 2017-05-01 DIAGNOSIS — E1140 Type 2 diabetes mellitus with diabetic neuropathy, unspecified: Secondary | ICD-10-CM | POA: Diagnosis not present

## 2017-05-01 DIAGNOSIS — J9612 Chronic respiratory failure with hypercapnia: Secondary | ICD-10-CM | POA: Diagnosis not present

## 2017-05-01 DIAGNOSIS — I132 Hypertensive heart and chronic kidney disease with heart failure and with stage 5 chronic kidney disease, or end stage renal disease: Secondary | ICD-10-CM | POA: Diagnosis not present

## 2017-05-01 DIAGNOSIS — D631 Anemia in chronic kidney disease: Secondary | ICD-10-CM | POA: Diagnosis not present

## 2017-05-01 DIAGNOSIS — Z992 Dependence on renal dialysis: Secondary | ICD-10-CM | POA: Diagnosis not present

## 2017-05-01 DIAGNOSIS — Z9981 Dependence on supplemental oxygen: Secondary | ICD-10-CM | POA: Diagnosis not present

## 2017-05-01 DIAGNOSIS — E43 Unspecified severe protein-calorie malnutrition: Secondary | ICD-10-CM | POA: Diagnosis not present

## 2017-05-01 DIAGNOSIS — Z905 Acquired absence of kidney: Secondary | ICD-10-CM | POA: Diagnosis not present

## 2017-05-01 DIAGNOSIS — Z794 Long term (current) use of insulin: Secondary | ICD-10-CM | POA: Diagnosis not present

## 2017-05-01 DIAGNOSIS — G610 Guillain-Barre syndrome: Secondary | ICD-10-CM | POA: Diagnosis not present

## 2017-05-01 DIAGNOSIS — Z7901 Long term (current) use of anticoagulants: Secondary | ICD-10-CM | POA: Diagnosis not present

## 2017-05-01 DIAGNOSIS — N186 End stage renal disease: Secondary | ICD-10-CM | POA: Diagnosis not present

## 2017-05-01 DIAGNOSIS — E1122 Type 2 diabetes mellitus with diabetic chronic kidney disease: Secondary | ICD-10-CM | POA: Diagnosis not present

## 2017-05-01 DIAGNOSIS — I5032 Chronic diastolic (congestive) heart failure: Secondary | ICD-10-CM | POA: Diagnosis not present

## 2017-05-02 DIAGNOSIS — N2581 Secondary hyperparathyroidism of renal origin: Secondary | ICD-10-CM | POA: Diagnosis not present

## 2017-05-02 DIAGNOSIS — N186 End stage renal disease: Secondary | ICD-10-CM | POA: Diagnosis not present

## 2017-05-02 DIAGNOSIS — T80211A Bloodstream infection due to central venous catheter, initial encounter: Secondary | ICD-10-CM | POA: Diagnosis not present

## 2017-05-02 DIAGNOSIS — D631 Anemia in chronic kidney disease: Secondary | ICD-10-CM | POA: Diagnosis not present

## 2017-05-02 DIAGNOSIS — D689 Coagulation defect, unspecified: Secondary | ICD-10-CM | POA: Diagnosis not present

## 2017-05-04 DIAGNOSIS — D631 Anemia in chronic kidney disease: Secondary | ICD-10-CM | POA: Diagnosis not present

## 2017-05-04 DIAGNOSIS — N186 End stage renal disease: Secondary | ICD-10-CM | POA: Diagnosis not present

## 2017-05-04 DIAGNOSIS — T80211A Bloodstream infection due to central venous catheter, initial encounter: Secondary | ICD-10-CM | POA: Diagnosis not present

## 2017-05-04 DIAGNOSIS — D689 Coagulation defect, unspecified: Secondary | ICD-10-CM | POA: Diagnosis not present

## 2017-05-04 DIAGNOSIS — N2581 Secondary hyperparathyroidism of renal origin: Secondary | ICD-10-CM | POA: Diagnosis not present

## 2017-05-06 DIAGNOSIS — I132 Hypertensive heart and chronic kidney disease with heart failure and with stage 5 chronic kidney disease, or end stage renal disease: Secondary | ICD-10-CM | POA: Diagnosis not present

## 2017-05-06 DIAGNOSIS — Z992 Dependence on renal dialysis: Secondary | ICD-10-CM | POA: Diagnosis not present

## 2017-05-06 DIAGNOSIS — Z7901 Long term (current) use of anticoagulants: Secondary | ICD-10-CM | POA: Diagnosis not present

## 2017-05-06 DIAGNOSIS — E43 Unspecified severe protein-calorie malnutrition: Secondary | ICD-10-CM | POA: Diagnosis not present

## 2017-05-06 DIAGNOSIS — E1122 Type 2 diabetes mellitus with diabetic chronic kidney disease: Secondary | ICD-10-CM | POA: Diagnosis not present

## 2017-05-06 DIAGNOSIS — Z9981 Dependence on supplemental oxygen: Secondary | ICD-10-CM | POA: Diagnosis not present

## 2017-05-06 DIAGNOSIS — I5032 Chronic diastolic (congestive) heart failure: Secondary | ICD-10-CM | POA: Diagnosis not present

## 2017-05-06 DIAGNOSIS — Z794 Long term (current) use of insulin: Secondary | ICD-10-CM | POA: Diagnosis not present

## 2017-05-06 DIAGNOSIS — J9612 Chronic respiratory failure with hypercapnia: Secondary | ICD-10-CM | POA: Diagnosis not present

## 2017-05-06 DIAGNOSIS — N186 End stage renal disease: Secondary | ICD-10-CM | POA: Diagnosis not present

## 2017-05-06 DIAGNOSIS — G610 Guillain-Barre syndrome: Secondary | ICD-10-CM | POA: Diagnosis not present

## 2017-05-06 DIAGNOSIS — E1140 Type 2 diabetes mellitus with diabetic neuropathy, unspecified: Secondary | ICD-10-CM | POA: Diagnosis not present

## 2017-05-06 DIAGNOSIS — Z905 Acquired absence of kidney: Secondary | ICD-10-CM | POA: Diagnosis not present

## 2017-05-06 DIAGNOSIS — D631 Anemia in chronic kidney disease: Secondary | ICD-10-CM | POA: Diagnosis not present

## 2017-05-07 DIAGNOSIS — D631 Anemia in chronic kidney disease: Secondary | ICD-10-CM | POA: Diagnosis not present

## 2017-05-07 DIAGNOSIS — N186 End stage renal disease: Secondary | ICD-10-CM | POA: Diagnosis not present

## 2017-05-07 DIAGNOSIS — T80211A Bloodstream infection due to central venous catheter, initial encounter: Secondary | ICD-10-CM | POA: Diagnosis not present

## 2017-05-07 DIAGNOSIS — N2581 Secondary hyperparathyroidism of renal origin: Secondary | ICD-10-CM | POA: Diagnosis not present

## 2017-05-07 DIAGNOSIS — D689 Coagulation defect, unspecified: Secondary | ICD-10-CM | POA: Diagnosis not present

## 2017-05-08 DIAGNOSIS — Z9981 Dependence on supplemental oxygen: Secondary | ICD-10-CM | POA: Diagnosis not present

## 2017-05-08 DIAGNOSIS — Z905 Acquired absence of kidney: Secondary | ICD-10-CM | POA: Diagnosis not present

## 2017-05-08 DIAGNOSIS — E43 Unspecified severe protein-calorie malnutrition: Secondary | ICD-10-CM | POA: Diagnosis not present

## 2017-05-08 DIAGNOSIS — I5032 Chronic diastolic (congestive) heart failure: Secondary | ICD-10-CM | POA: Diagnosis not present

## 2017-05-08 DIAGNOSIS — Z7901 Long term (current) use of anticoagulants: Secondary | ICD-10-CM | POA: Diagnosis not present

## 2017-05-08 DIAGNOSIS — E1122 Type 2 diabetes mellitus with diabetic chronic kidney disease: Secondary | ICD-10-CM | POA: Diagnosis not present

## 2017-05-08 DIAGNOSIS — N186 End stage renal disease: Secondary | ICD-10-CM | POA: Diagnosis not present

## 2017-05-08 DIAGNOSIS — G610 Guillain-Barre syndrome: Secondary | ICD-10-CM | POA: Diagnosis not present

## 2017-05-08 DIAGNOSIS — D631 Anemia in chronic kidney disease: Secondary | ICD-10-CM | POA: Diagnosis not present

## 2017-05-08 DIAGNOSIS — I132 Hypertensive heart and chronic kidney disease with heart failure and with stage 5 chronic kidney disease, or end stage renal disease: Secondary | ICD-10-CM | POA: Diagnosis not present

## 2017-05-08 DIAGNOSIS — Z992 Dependence on renal dialysis: Secondary | ICD-10-CM | POA: Diagnosis not present

## 2017-05-08 DIAGNOSIS — J9612 Chronic respiratory failure with hypercapnia: Secondary | ICD-10-CM | POA: Diagnosis not present

## 2017-05-08 DIAGNOSIS — Z794 Long term (current) use of insulin: Secondary | ICD-10-CM | POA: Diagnosis not present

## 2017-05-08 DIAGNOSIS — E1140 Type 2 diabetes mellitus with diabetic neuropathy, unspecified: Secondary | ICD-10-CM | POA: Diagnosis not present

## 2017-05-09 DIAGNOSIS — N2581 Secondary hyperparathyroidism of renal origin: Secondary | ICD-10-CM | POA: Diagnosis not present

## 2017-05-09 DIAGNOSIS — N186 End stage renal disease: Secondary | ICD-10-CM | POA: Diagnosis not present

## 2017-05-09 DIAGNOSIS — D631 Anemia in chronic kidney disease: Secondary | ICD-10-CM | POA: Diagnosis not present

## 2017-05-09 DIAGNOSIS — D689 Coagulation defect, unspecified: Secondary | ICD-10-CM | POA: Diagnosis not present

## 2017-05-09 DIAGNOSIS — T80211A Bloodstream infection due to central venous catheter, initial encounter: Secondary | ICD-10-CM | POA: Diagnosis not present

## 2017-05-11 DIAGNOSIS — D631 Anemia in chronic kidney disease: Secondary | ICD-10-CM | POA: Diagnosis not present

## 2017-05-11 DIAGNOSIS — D689 Coagulation defect, unspecified: Secondary | ICD-10-CM | POA: Diagnosis not present

## 2017-05-11 DIAGNOSIS — N2581 Secondary hyperparathyroidism of renal origin: Secondary | ICD-10-CM | POA: Diagnosis not present

## 2017-05-11 DIAGNOSIS — T80211A Bloodstream infection due to central venous catheter, initial encounter: Secondary | ICD-10-CM | POA: Diagnosis not present

## 2017-05-11 DIAGNOSIS — N186 End stage renal disease: Secondary | ICD-10-CM | POA: Diagnosis not present

## 2017-05-12 DIAGNOSIS — Z992 Dependence on renal dialysis: Secondary | ICD-10-CM | POA: Diagnosis not present

## 2017-05-12 DIAGNOSIS — N186 End stage renal disease: Secondary | ICD-10-CM | POA: Diagnosis not present

## 2017-05-12 DIAGNOSIS — E1122 Type 2 diabetes mellitus with diabetic chronic kidney disease: Secondary | ICD-10-CM | POA: Diagnosis not present

## 2017-05-14 DIAGNOSIS — N2581 Secondary hyperparathyroidism of renal origin: Secondary | ICD-10-CM | POA: Diagnosis not present

## 2017-05-14 DIAGNOSIS — D631 Anemia in chronic kidney disease: Secondary | ICD-10-CM | POA: Diagnosis not present

## 2017-05-14 DIAGNOSIS — N186 End stage renal disease: Secondary | ICD-10-CM | POA: Diagnosis not present

## 2017-05-14 DIAGNOSIS — R51 Headache: Secondary | ICD-10-CM | POA: Diagnosis not present

## 2017-05-14 DIAGNOSIS — D689 Coagulation defect, unspecified: Secondary | ICD-10-CM | POA: Diagnosis not present

## 2017-05-16 DIAGNOSIS — R51 Headache: Secondary | ICD-10-CM | POA: Diagnosis not present

## 2017-05-16 DIAGNOSIS — D631 Anemia in chronic kidney disease: Secondary | ICD-10-CM | POA: Diagnosis not present

## 2017-05-16 DIAGNOSIS — N2581 Secondary hyperparathyroidism of renal origin: Secondary | ICD-10-CM | POA: Diagnosis not present

## 2017-05-16 DIAGNOSIS — D689 Coagulation defect, unspecified: Secondary | ICD-10-CM | POA: Diagnosis not present

## 2017-05-16 DIAGNOSIS — N186 End stage renal disease: Secondary | ICD-10-CM | POA: Diagnosis not present

## 2017-05-18 DIAGNOSIS — N186 End stage renal disease: Secondary | ICD-10-CM | POA: Diagnosis not present

## 2017-05-18 DIAGNOSIS — R51 Headache: Secondary | ICD-10-CM | POA: Diagnosis not present

## 2017-05-18 DIAGNOSIS — D631 Anemia in chronic kidney disease: Secondary | ICD-10-CM | POA: Diagnosis not present

## 2017-05-18 DIAGNOSIS — N2581 Secondary hyperparathyroidism of renal origin: Secondary | ICD-10-CM | POA: Diagnosis not present

## 2017-05-18 DIAGNOSIS — D689 Coagulation defect, unspecified: Secondary | ICD-10-CM | POA: Diagnosis not present

## 2017-05-18 DIAGNOSIS — I5033 Acute on chronic diastolic (congestive) heart failure: Secondary | ICD-10-CM | POA: Diagnosis not present

## 2017-05-19 ENCOUNTER — Other Ambulatory Visit: Payer: Self-pay | Admitting: Family Medicine

## 2017-05-19 DIAGNOSIS — G610 Guillain-Barre syndrome: Secondary | ICD-10-CM | POA: Diagnosis not present

## 2017-05-19 DIAGNOSIS — J9610 Chronic respiratory failure, unspecified whether with hypoxia or hypercapnia: Secondary | ICD-10-CM | POA: Diagnosis not present

## 2017-05-21 DIAGNOSIS — D631 Anemia in chronic kidney disease: Secondary | ICD-10-CM | POA: Diagnosis not present

## 2017-05-21 DIAGNOSIS — N186 End stage renal disease: Secondary | ICD-10-CM | POA: Diagnosis not present

## 2017-05-21 DIAGNOSIS — N2581 Secondary hyperparathyroidism of renal origin: Secondary | ICD-10-CM | POA: Diagnosis not present

## 2017-05-21 DIAGNOSIS — D689 Coagulation defect, unspecified: Secondary | ICD-10-CM | POA: Diagnosis not present

## 2017-05-21 DIAGNOSIS — R51 Headache: Secondary | ICD-10-CM | POA: Diagnosis not present

## 2017-05-23 DIAGNOSIS — N186 End stage renal disease: Secondary | ICD-10-CM | POA: Diagnosis not present

## 2017-05-23 DIAGNOSIS — D631 Anemia in chronic kidney disease: Secondary | ICD-10-CM | POA: Diagnosis not present

## 2017-05-23 DIAGNOSIS — R51 Headache: Secondary | ICD-10-CM | POA: Diagnosis not present

## 2017-05-23 DIAGNOSIS — N2581 Secondary hyperparathyroidism of renal origin: Secondary | ICD-10-CM | POA: Diagnosis not present

## 2017-05-23 DIAGNOSIS — D689 Coagulation defect, unspecified: Secondary | ICD-10-CM | POA: Diagnosis not present

## 2017-05-25 DIAGNOSIS — D631 Anemia in chronic kidney disease: Secondary | ICD-10-CM | POA: Diagnosis not present

## 2017-05-25 DIAGNOSIS — R51 Headache: Secondary | ICD-10-CM | POA: Diagnosis not present

## 2017-05-25 DIAGNOSIS — N186 End stage renal disease: Secondary | ICD-10-CM | POA: Diagnosis not present

## 2017-05-25 DIAGNOSIS — D689 Coagulation defect, unspecified: Secondary | ICD-10-CM | POA: Diagnosis not present

## 2017-05-25 DIAGNOSIS — N2581 Secondary hyperparathyroidism of renal origin: Secondary | ICD-10-CM | POA: Diagnosis not present

## 2017-05-28 DIAGNOSIS — N2581 Secondary hyperparathyroidism of renal origin: Secondary | ICD-10-CM | POA: Diagnosis not present

## 2017-05-28 DIAGNOSIS — D631 Anemia in chronic kidney disease: Secondary | ICD-10-CM | POA: Diagnosis not present

## 2017-05-28 DIAGNOSIS — D689 Coagulation defect, unspecified: Secondary | ICD-10-CM | POA: Diagnosis not present

## 2017-05-28 DIAGNOSIS — N186 End stage renal disease: Secondary | ICD-10-CM | POA: Diagnosis not present

## 2017-05-28 DIAGNOSIS — R51 Headache: Secondary | ICD-10-CM | POA: Diagnosis not present

## 2017-05-30 DIAGNOSIS — D689 Coagulation defect, unspecified: Secondary | ICD-10-CM | POA: Diagnosis not present

## 2017-05-30 DIAGNOSIS — R51 Headache: Secondary | ICD-10-CM | POA: Diagnosis not present

## 2017-05-30 DIAGNOSIS — D631 Anemia in chronic kidney disease: Secondary | ICD-10-CM | POA: Diagnosis not present

## 2017-05-30 DIAGNOSIS — N2581 Secondary hyperparathyroidism of renal origin: Secondary | ICD-10-CM | POA: Diagnosis not present

## 2017-05-30 DIAGNOSIS — N186 End stage renal disease: Secondary | ICD-10-CM | POA: Diagnosis not present

## 2017-06-01 DIAGNOSIS — R51 Headache: Secondary | ICD-10-CM | POA: Diagnosis not present

## 2017-06-01 DIAGNOSIS — D689 Coagulation defect, unspecified: Secondary | ICD-10-CM | POA: Diagnosis not present

## 2017-06-01 DIAGNOSIS — N2581 Secondary hyperparathyroidism of renal origin: Secondary | ICD-10-CM | POA: Diagnosis not present

## 2017-06-01 DIAGNOSIS — D631 Anemia in chronic kidney disease: Secondary | ICD-10-CM | POA: Diagnosis not present

## 2017-06-01 DIAGNOSIS — N186 End stage renal disease: Secondary | ICD-10-CM | POA: Diagnosis not present

## 2017-06-04 DIAGNOSIS — R51 Headache: Secondary | ICD-10-CM | POA: Diagnosis not present

## 2017-06-04 DIAGNOSIS — N186 End stage renal disease: Secondary | ICD-10-CM | POA: Diagnosis not present

## 2017-06-04 DIAGNOSIS — N2581 Secondary hyperparathyroidism of renal origin: Secondary | ICD-10-CM | POA: Diagnosis not present

## 2017-06-04 DIAGNOSIS — D631 Anemia in chronic kidney disease: Secondary | ICD-10-CM | POA: Diagnosis not present

## 2017-06-04 DIAGNOSIS — D689 Coagulation defect, unspecified: Secondary | ICD-10-CM | POA: Diagnosis not present

## 2017-06-05 DIAGNOSIS — I739 Peripheral vascular disease, unspecified: Secondary | ICD-10-CM | POA: Diagnosis not present

## 2017-06-05 DIAGNOSIS — M2041 Other hammer toe(s) (acquired), right foot: Secondary | ICD-10-CM | POA: Diagnosis not present

## 2017-06-05 DIAGNOSIS — B351 Tinea unguium: Secondary | ICD-10-CM | POA: Diagnosis not present

## 2017-06-05 DIAGNOSIS — E1142 Type 2 diabetes mellitus with diabetic polyneuropathy: Secondary | ICD-10-CM | POA: Diagnosis not present

## 2017-06-05 DIAGNOSIS — M2042 Other hammer toe(s) (acquired), left foot: Secondary | ICD-10-CM | POA: Diagnosis not present

## 2017-06-06 DIAGNOSIS — R51 Headache: Secondary | ICD-10-CM | POA: Diagnosis not present

## 2017-06-06 DIAGNOSIS — N2581 Secondary hyperparathyroidism of renal origin: Secondary | ICD-10-CM | POA: Diagnosis not present

## 2017-06-06 DIAGNOSIS — D631 Anemia in chronic kidney disease: Secondary | ICD-10-CM | POA: Diagnosis not present

## 2017-06-06 DIAGNOSIS — N186 End stage renal disease: Secondary | ICD-10-CM | POA: Diagnosis not present

## 2017-06-06 DIAGNOSIS — D689 Coagulation defect, unspecified: Secondary | ICD-10-CM | POA: Diagnosis not present

## 2017-06-08 DIAGNOSIS — N2581 Secondary hyperparathyroidism of renal origin: Secondary | ICD-10-CM | POA: Diagnosis not present

## 2017-06-08 DIAGNOSIS — N186 End stage renal disease: Secondary | ICD-10-CM | POA: Diagnosis not present

## 2017-06-08 DIAGNOSIS — D689 Coagulation defect, unspecified: Secondary | ICD-10-CM | POA: Diagnosis not present

## 2017-06-08 DIAGNOSIS — D631 Anemia in chronic kidney disease: Secondary | ICD-10-CM | POA: Diagnosis not present

## 2017-06-08 DIAGNOSIS — R51 Headache: Secondary | ICD-10-CM | POA: Diagnosis not present

## 2017-06-11 DIAGNOSIS — E1122 Type 2 diabetes mellitus with diabetic chronic kidney disease: Secondary | ICD-10-CM | POA: Diagnosis not present

## 2017-06-11 DIAGNOSIS — D689 Coagulation defect, unspecified: Secondary | ICD-10-CM | POA: Diagnosis not present

## 2017-06-11 DIAGNOSIS — N186 End stage renal disease: Secondary | ICD-10-CM | POA: Diagnosis not present

## 2017-06-11 DIAGNOSIS — Z992 Dependence on renal dialysis: Secondary | ICD-10-CM | POA: Diagnosis not present

## 2017-06-11 DIAGNOSIS — N2581 Secondary hyperparathyroidism of renal origin: Secondary | ICD-10-CM | POA: Diagnosis not present

## 2017-06-11 DIAGNOSIS — R51 Headache: Secondary | ICD-10-CM | POA: Diagnosis not present

## 2017-06-11 DIAGNOSIS — D631 Anemia in chronic kidney disease: Secondary | ICD-10-CM | POA: Diagnosis not present

## 2017-06-13 DIAGNOSIS — N2581 Secondary hyperparathyroidism of renal origin: Secondary | ICD-10-CM | POA: Diagnosis not present

## 2017-06-13 DIAGNOSIS — N186 End stage renal disease: Secondary | ICD-10-CM | POA: Diagnosis not present

## 2017-06-13 DIAGNOSIS — D689 Coagulation defect, unspecified: Secondary | ICD-10-CM | POA: Diagnosis not present

## 2017-06-14 DIAGNOSIS — Z01818 Encounter for other preprocedural examination: Secondary | ICD-10-CM | POA: Diagnosis not present

## 2017-06-15 DIAGNOSIS — N186 End stage renal disease: Secondary | ICD-10-CM | POA: Diagnosis not present

## 2017-06-15 DIAGNOSIS — D689 Coagulation defect, unspecified: Secondary | ICD-10-CM | POA: Diagnosis not present

## 2017-06-15 DIAGNOSIS — N2581 Secondary hyperparathyroidism of renal origin: Secondary | ICD-10-CM | POA: Diagnosis not present

## 2017-06-17 DIAGNOSIS — I5033 Acute on chronic diastolic (congestive) heart failure: Secondary | ICD-10-CM | POA: Diagnosis not present

## 2017-06-17 DIAGNOSIS — N2581 Secondary hyperparathyroidism of renal origin: Secondary | ICD-10-CM | POA: Diagnosis not present

## 2017-06-17 DIAGNOSIS — R51 Headache: Secondary | ICD-10-CM | POA: Diagnosis not present

## 2017-06-17 DIAGNOSIS — D689 Coagulation defect, unspecified: Secondary | ICD-10-CM | POA: Diagnosis not present

## 2017-06-17 DIAGNOSIS — Z4931 Encounter for adequacy testing for hemodialysis: Secondary | ICD-10-CM | POA: Diagnosis not present

## 2017-06-17 DIAGNOSIS — N186 End stage renal disease: Secondary | ICD-10-CM | POA: Diagnosis not present

## 2017-06-18 DIAGNOSIS — G610 Guillain-Barre syndrome: Secondary | ICD-10-CM | POA: Diagnosis not present

## 2017-06-18 DIAGNOSIS — J9610 Chronic respiratory failure, unspecified whether with hypoxia or hypercapnia: Secondary | ICD-10-CM | POA: Diagnosis not present

## 2017-06-19 DIAGNOSIS — Z4931 Encounter for adequacy testing for hemodialysis: Secondary | ICD-10-CM | POA: Diagnosis not present

## 2017-06-19 DIAGNOSIS — R51 Headache: Secondary | ICD-10-CM | POA: Diagnosis not present

## 2017-06-19 DIAGNOSIS — N2581 Secondary hyperparathyroidism of renal origin: Secondary | ICD-10-CM | POA: Diagnosis not present

## 2017-06-19 DIAGNOSIS — N186 End stage renal disease: Secondary | ICD-10-CM | POA: Diagnosis not present

## 2017-06-19 DIAGNOSIS — D689 Coagulation defect, unspecified: Secondary | ICD-10-CM | POA: Diagnosis not present

## 2017-06-21 DIAGNOSIS — N186 End stage renal disease: Secondary | ICD-10-CM | POA: Diagnosis not present

## 2017-06-21 DIAGNOSIS — N2581 Secondary hyperparathyroidism of renal origin: Secondary | ICD-10-CM | POA: Diagnosis not present

## 2017-06-21 DIAGNOSIS — R51 Headache: Secondary | ICD-10-CM | POA: Diagnosis not present

## 2017-06-21 DIAGNOSIS — D689 Coagulation defect, unspecified: Secondary | ICD-10-CM | POA: Diagnosis not present

## 2017-06-21 DIAGNOSIS — Z4931 Encounter for adequacy testing for hemodialysis: Secondary | ICD-10-CM | POA: Diagnosis not present

## 2017-06-24 DIAGNOSIS — N2581 Secondary hyperparathyroidism of renal origin: Secondary | ICD-10-CM | POA: Diagnosis not present

## 2017-06-24 DIAGNOSIS — Z992 Dependence on renal dialysis: Secondary | ICD-10-CM | POA: Diagnosis not present

## 2017-06-24 DIAGNOSIS — N186 End stage renal disease: Secondary | ICD-10-CM | POA: Diagnosis not present

## 2017-06-24 DIAGNOSIS — D689 Coagulation defect, unspecified: Secondary | ICD-10-CM | POA: Diagnosis not present

## 2017-06-24 DIAGNOSIS — R51 Headache: Secondary | ICD-10-CM | POA: Diagnosis not present

## 2017-06-24 DIAGNOSIS — Z4931 Encounter for adequacy testing for hemodialysis: Secondary | ICD-10-CM | POA: Diagnosis not present

## 2017-06-26 DIAGNOSIS — N2581 Secondary hyperparathyroidism of renal origin: Secondary | ICD-10-CM | POA: Diagnosis not present

## 2017-06-26 DIAGNOSIS — Z4931 Encounter for adequacy testing for hemodialysis: Secondary | ICD-10-CM | POA: Diagnosis not present

## 2017-06-26 DIAGNOSIS — N186 End stage renal disease: Secondary | ICD-10-CM | POA: Diagnosis not present

## 2017-06-26 DIAGNOSIS — D689 Coagulation defect, unspecified: Secondary | ICD-10-CM | POA: Diagnosis not present

## 2017-06-26 DIAGNOSIS — R51 Headache: Secondary | ICD-10-CM | POA: Diagnosis not present

## 2017-06-28 DIAGNOSIS — N2581 Secondary hyperparathyroidism of renal origin: Secondary | ICD-10-CM | POA: Diagnosis not present

## 2017-06-28 DIAGNOSIS — D689 Coagulation defect, unspecified: Secondary | ICD-10-CM | POA: Diagnosis not present

## 2017-06-28 DIAGNOSIS — R51 Headache: Secondary | ICD-10-CM | POA: Diagnosis not present

## 2017-06-28 DIAGNOSIS — N186 End stage renal disease: Secondary | ICD-10-CM | POA: Diagnosis not present

## 2017-06-28 DIAGNOSIS — Z4931 Encounter for adequacy testing for hemodialysis: Secondary | ICD-10-CM | POA: Diagnosis not present

## 2017-07-01 DIAGNOSIS — H539 Unspecified visual disturbance: Secondary | ICD-10-CM | POA: Diagnosis not present

## 2017-07-01 DIAGNOSIS — D689 Coagulation defect, unspecified: Secondary | ICD-10-CM | POA: Diagnosis not present

## 2017-07-01 DIAGNOSIS — Z4931 Encounter for adequacy testing for hemodialysis: Secondary | ICD-10-CM | POA: Diagnosis not present

## 2017-07-01 DIAGNOSIS — E113399 Type 2 diabetes mellitus with moderate nonproliferative diabetic retinopathy without macular edema, unspecified eye: Secondary | ICD-10-CM | POA: Diagnosis not present

## 2017-07-01 DIAGNOSIS — N2581 Secondary hyperparathyroidism of renal origin: Secondary | ICD-10-CM | POA: Diagnosis not present

## 2017-07-01 DIAGNOSIS — G610 Guillain-Barre syndrome: Secondary | ICD-10-CM | POA: Diagnosis not present

## 2017-07-01 DIAGNOSIS — R51 Headache: Secondary | ICD-10-CM | POA: Diagnosis not present

## 2017-07-01 DIAGNOSIS — H2513 Age-related nuclear cataract, bilateral: Secondary | ICD-10-CM | POA: Diagnosis not present

## 2017-07-01 DIAGNOSIS — N186 End stage renal disease: Secondary | ICD-10-CM | POA: Diagnosis not present

## 2017-07-03 DIAGNOSIS — R51 Headache: Secondary | ICD-10-CM | POA: Diagnosis not present

## 2017-07-03 DIAGNOSIS — D689 Coagulation defect, unspecified: Secondary | ICD-10-CM | POA: Diagnosis not present

## 2017-07-03 DIAGNOSIS — N186 End stage renal disease: Secondary | ICD-10-CM | POA: Diagnosis not present

## 2017-07-03 DIAGNOSIS — N2581 Secondary hyperparathyroidism of renal origin: Secondary | ICD-10-CM | POA: Diagnosis not present

## 2017-07-03 DIAGNOSIS — Z4931 Encounter for adequacy testing for hemodialysis: Secondary | ICD-10-CM | POA: Diagnosis not present

## 2017-07-05 DIAGNOSIS — Z4931 Encounter for adequacy testing for hemodialysis: Secondary | ICD-10-CM | POA: Diagnosis not present

## 2017-07-05 DIAGNOSIS — N186 End stage renal disease: Secondary | ICD-10-CM | POA: Diagnosis not present

## 2017-07-05 DIAGNOSIS — N2581 Secondary hyperparathyroidism of renal origin: Secondary | ICD-10-CM | POA: Diagnosis not present

## 2017-07-05 DIAGNOSIS — R51 Headache: Secondary | ICD-10-CM | POA: Diagnosis not present

## 2017-07-05 DIAGNOSIS — D689 Coagulation defect, unspecified: Secondary | ICD-10-CM | POA: Diagnosis not present

## 2017-07-08 DIAGNOSIS — N2581 Secondary hyperparathyroidism of renal origin: Secondary | ICD-10-CM | POA: Diagnosis not present

## 2017-07-08 DIAGNOSIS — R51 Headache: Secondary | ICD-10-CM | POA: Diagnosis not present

## 2017-07-08 DIAGNOSIS — D689 Coagulation defect, unspecified: Secondary | ICD-10-CM | POA: Diagnosis not present

## 2017-07-08 DIAGNOSIS — N186 End stage renal disease: Secondary | ICD-10-CM | POA: Diagnosis not present

## 2017-07-08 DIAGNOSIS — Z4931 Encounter for adequacy testing for hemodialysis: Secondary | ICD-10-CM | POA: Diagnosis not present

## 2017-07-09 DIAGNOSIS — T82858A Stenosis of vascular prosthetic devices, implants and grafts, initial encounter: Secondary | ICD-10-CM | POA: Diagnosis not present

## 2017-07-10 DIAGNOSIS — N186 End stage renal disease: Secondary | ICD-10-CM | POA: Diagnosis not present

## 2017-07-10 DIAGNOSIS — N2581 Secondary hyperparathyroidism of renal origin: Secondary | ICD-10-CM | POA: Diagnosis not present

## 2017-07-10 DIAGNOSIS — R51 Headache: Secondary | ICD-10-CM | POA: Diagnosis not present

## 2017-07-10 DIAGNOSIS — D689 Coagulation defect, unspecified: Secondary | ICD-10-CM | POA: Diagnosis not present

## 2017-07-10 DIAGNOSIS — Z4931 Encounter for adequacy testing for hemodialysis: Secondary | ICD-10-CM | POA: Diagnosis not present

## 2017-07-11 DIAGNOSIS — J9621 Acute and chronic respiratory failure with hypoxia: Secondary | ICD-10-CM | POA: Diagnosis not present

## 2017-07-11 DIAGNOSIS — G610 Guillain-Barre syndrome: Secondary | ICD-10-CM | POA: Diagnosis not present

## 2017-07-11 DIAGNOSIS — R0600 Dyspnea, unspecified: Secondary | ICD-10-CM | POA: Diagnosis not present

## 2017-07-12 DIAGNOSIS — N186 End stage renal disease: Secondary | ICD-10-CM | POA: Diagnosis not present

## 2017-07-12 DIAGNOSIS — R51 Headache: Secondary | ICD-10-CM | POA: Diagnosis not present

## 2017-07-12 DIAGNOSIS — D689 Coagulation defect, unspecified: Secondary | ICD-10-CM | POA: Diagnosis not present

## 2017-07-12 DIAGNOSIS — Z4931 Encounter for adequacy testing for hemodialysis: Secondary | ICD-10-CM | POA: Diagnosis not present

## 2017-07-12 DIAGNOSIS — N2581 Secondary hyperparathyroidism of renal origin: Secondary | ICD-10-CM | POA: Diagnosis not present

## 2017-07-15 DIAGNOSIS — R51 Headache: Secondary | ICD-10-CM | POA: Diagnosis not present

## 2017-07-15 DIAGNOSIS — D689 Coagulation defect, unspecified: Secondary | ICD-10-CM | POA: Diagnosis not present

## 2017-07-15 DIAGNOSIS — D509 Iron deficiency anemia, unspecified: Secondary | ICD-10-CM | POA: Diagnosis not present

## 2017-07-15 DIAGNOSIS — N186 End stage renal disease: Secondary | ICD-10-CM | POA: Diagnosis not present

## 2017-07-15 DIAGNOSIS — D631 Anemia in chronic kidney disease: Secondary | ICD-10-CM | POA: Diagnosis not present

## 2017-07-15 DIAGNOSIS — Z4931 Encounter for adequacy testing for hemodialysis: Secondary | ICD-10-CM | POA: Diagnosis not present

## 2017-07-15 DIAGNOSIS — N2581 Secondary hyperparathyroidism of renal origin: Secondary | ICD-10-CM | POA: Diagnosis not present

## 2017-07-17 DIAGNOSIS — D509 Iron deficiency anemia, unspecified: Secondary | ICD-10-CM | POA: Diagnosis not present

## 2017-07-17 DIAGNOSIS — N186 End stage renal disease: Secondary | ICD-10-CM | POA: Diagnosis not present

## 2017-07-17 DIAGNOSIS — Z4931 Encounter for adequacy testing for hemodialysis: Secondary | ICD-10-CM | POA: Diagnosis not present

## 2017-07-17 DIAGNOSIS — D631 Anemia in chronic kidney disease: Secondary | ICD-10-CM | POA: Diagnosis not present

## 2017-07-17 DIAGNOSIS — R51 Headache: Secondary | ICD-10-CM | POA: Diagnosis not present

## 2017-07-17 DIAGNOSIS — N2581 Secondary hyperparathyroidism of renal origin: Secondary | ICD-10-CM | POA: Diagnosis not present

## 2017-07-17 DIAGNOSIS — D689 Coagulation defect, unspecified: Secondary | ICD-10-CM | POA: Diagnosis not present

## 2017-07-18 DIAGNOSIS — I5033 Acute on chronic diastolic (congestive) heart failure: Secondary | ICD-10-CM | POA: Diagnosis not present

## 2017-07-19 DIAGNOSIS — D509 Iron deficiency anemia, unspecified: Secondary | ICD-10-CM | POA: Diagnosis not present

## 2017-07-19 DIAGNOSIS — D631 Anemia in chronic kidney disease: Secondary | ICD-10-CM | POA: Diagnosis not present

## 2017-07-19 DIAGNOSIS — N186 End stage renal disease: Secondary | ICD-10-CM | POA: Diagnosis not present

## 2017-07-19 DIAGNOSIS — D689 Coagulation defect, unspecified: Secondary | ICD-10-CM | POA: Diagnosis not present

## 2017-07-19 DIAGNOSIS — G610 Guillain-Barre syndrome: Secondary | ICD-10-CM | POA: Diagnosis not present

## 2017-07-19 DIAGNOSIS — J9610 Chronic respiratory failure, unspecified whether with hypoxia or hypercapnia: Secondary | ICD-10-CM | POA: Diagnosis not present

## 2017-07-19 DIAGNOSIS — Z4931 Encounter for adequacy testing for hemodialysis: Secondary | ICD-10-CM | POA: Diagnosis not present

## 2017-07-19 DIAGNOSIS — N2581 Secondary hyperparathyroidism of renal origin: Secondary | ICD-10-CM | POA: Diagnosis not present

## 2017-07-19 DIAGNOSIS — R51 Headache: Secondary | ICD-10-CM | POA: Diagnosis not present

## 2017-07-23 DIAGNOSIS — Z4931 Encounter for adequacy testing for hemodialysis: Secondary | ICD-10-CM | POA: Diagnosis not present

## 2017-07-23 DIAGNOSIS — R51 Headache: Secondary | ICD-10-CM | POA: Diagnosis not present

## 2017-07-23 DIAGNOSIS — D689 Coagulation defect, unspecified: Secondary | ICD-10-CM | POA: Diagnosis not present

## 2017-07-23 DIAGNOSIS — N2581 Secondary hyperparathyroidism of renal origin: Secondary | ICD-10-CM | POA: Diagnosis not present

## 2017-07-23 DIAGNOSIS — D631 Anemia in chronic kidney disease: Secondary | ICD-10-CM | POA: Diagnosis not present

## 2017-07-23 DIAGNOSIS — D509 Iron deficiency anemia, unspecified: Secondary | ICD-10-CM | POA: Diagnosis not present

## 2017-07-23 DIAGNOSIS — N186 End stage renal disease: Secondary | ICD-10-CM | POA: Diagnosis not present

## 2017-07-24 DIAGNOSIS — N2581 Secondary hyperparathyroidism of renal origin: Secondary | ICD-10-CM | POA: Diagnosis not present

## 2017-07-24 DIAGNOSIS — D689 Coagulation defect, unspecified: Secondary | ICD-10-CM | POA: Diagnosis not present

## 2017-07-24 DIAGNOSIS — D509 Iron deficiency anemia, unspecified: Secondary | ICD-10-CM | POA: Diagnosis not present

## 2017-07-24 DIAGNOSIS — D631 Anemia in chronic kidney disease: Secondary | ICD-10-CM | POA: Diagnosis not present

## 2017-07-24 DIAGNOSIS — N186 End stage renal disease: Secondary | ICD-10-CM | POA: Diagnosis not present

## 2017-07-24 DIAGNOSIS — R51 Headache: Secondary | ICD-10-CM | POA: Diagnosis not present

## 2017-07-24 DIAGNOSIS — Z4931 Encounter for adequacy testing for hemodialysis: Secondary | ICD-10-CM | POA: Diagnosis not present

## 2017-07-26 DIAGNOSIS — R51 Headache: Secondary | ICD-10-CM | POA: Diagnosis not present

## 2017-07-26 DIAGNOSIS — Z4931 Encounter for adequacy testing for hemodialysis: Secondary | ICD-10-CM | POA: Diagnosis not present

## 2017-07-26 DIAGNOSIS — D509 Iron deficiency anemia, unspecified: Secondary | ICD-10-CM | POA: Diagnosis not present

## 2017-07-26 DIAGNOSIS — D631 Anemia in chronic kidney disease: Secondary | ICD-10-CM | POA: Diagnosis not present

## 2017-07-26 DIAGNOSIS — N186 End stage renal disease: Secondary | ICD-10-CM | POA: Diagnosis not present

## 2017-07-26 DIAGNOSIS — N2581 Secondary hyperparathyroidism of renal origin: Secondary | ICD-10-CM | POA: Diagnosis not present

## 2017-07-26 DIAGNOSIS — D689 Coagulation defect, unspecified: Secondary | ICD-10-CM | POA: Diagnosis not present

## 2017-07-29 DIAGNOSIS — N2581 Secondary hyperparathyroidism of renal origin: Secondary | ICD-10-CM | POA: Diagnosis not present

## 2017-07-29 DIAGNOSIS — D689 Coagulation defect, unspecified: Secondary | ICD-10-CM | POA: Diagnosis not present

## 2017-07-29 DIAGNOSIS — D631 Anemia in chronic kidney disease: Secondary | ICD-10-CM | POA: Diagnosis not present

## 2017-07-29 DIAGNOSIS — D509 Iron deficiency anemia, unspecified: Secondary | ICD-10-CM | POA: Diagnosis not present

## 2017-07-29 DIAGNOSIS — Z4931 Encounter for adequacy testing for hemodialysis: Secondary | ICD-10-CM | POA: Diagnosis not present

## 2017-07-29 DIAGNOSIS — R51 Headache: Secondary | ICD-10-CM | POA: Diagnosis not present

## 2017-07-29 DIAGNOSIS — N186 End stage renal disease: Secondary | ICD-10-CM | POA: Diagnosis not present

## 2017-07-30 DIAGNOSIS — G5790 Unspecified mononeuropathy of unspecified lower limb: Secondary | ICD-10-CM | POA: Diagnosis not present

## 2017-07-30 DIAGNOSIS — G610 Guillain-Barre syndrome: Secondary | ICD-10-CM | POA: Diagnosis not present

## 2017-07-30 DIAGNOSIS — R5381 Other malaise: Secondary | ICD-10-CM | POA: Diagnosis not present

## 2017-07-30 DIAGNOSIS — G6181 Chronic inflammatory demyelinating polyneuritis: Secondary | ICD-10-CM | POA: Diagnosis not present

## 2017-07-31 DIAGNOSIS — D689 Coagulation defect, unspecified: Secondary | ICD-10-CM | POA: Diagnosis not present

## 2017-07-31 DIAGNOSIS — D509 Iron deficiency anemia, unspecified: Secondary | ICD-10-CM | POA: Diagnosis not present

## 2017-07-31 DIAGNOSIS — Z4931 Encounter for adequacy testing for hemodialysis: Secondary | ICD-10-CM | POA: Diagnosis not present

## 2017-07-31 DIAGNOSIS — R51 Headache: Secondary | ICD-10-CM | POA: Diagnosis not present

## 2017-07-31 DIAGNOSIS — N2581 Secondary hyperparathyroidism of renal origin: Secondary | ICD-10-CM | POA: Diagnosis not present

## 2017-07-31 DIAGNOSIS — D631 Anemia in chronic kidney disease: Secondary | ICD-10-CM | POA: Diagnosis not present

## 2017-07-31 DIAGNOSIS — N186 End stage renal disease: Secondary | ICD-10-CM | POA: Diagnosis not present

## 2017-08-02 DIAGNOSIS — R51 Headache: Secondary | ICD-10-CM | POA: Diagnosis not present

## 2017-08-02 DIAGNOSIS — D631 Anemia in chronic kidney disease: Secondary | ICD-10-CM | POA: Diagnosis not present

## 2017-08-02 DIAGNOSIS — N2581 Secondary hyperparathyroidism of renal origin: Secondary | ICD-10-CM | POA: Diagnosis not present

## 2017-08-02 DIAGNOSIS — D509 Iron deficiency anemia, unspecified: Secondary | ICD-10-CM | POA: Diagnosis not present

## 2017-08-02 DIAGNOSIS — Z4931 Encounter for adequacy testing for hemodialysis: Secondary | ICD-10-CM | POA: Diagnosis not present

## 2017-08-02 DIAGNOSIS — N186 End stage renal disease: Secondary | ICD-10-CM | POA: Diagnosis not present

## 2017-08-02 DIAGNOSIS — D689 Coagulation defect, unspecified: Secondary | ICD-10-CM | POA: Diagnosis not present

## 2017-08-05 DIAGNOSIS — D689 Coagulation defect, unspecified: Secondary | ICD-10-CM | POA: Diagnosis not present

## 2017-08-05 DIAGNOSIS — N186 End stage renal disease: Secondary | ICD-10-CM | POA: Diagnosis not present

## 2017-08-05 DIAGNOSIS — N2581 Secondary hyperparathyroidism of renal origin: Secondary | ICD-10-CM | POA: Diagnosis not present

## 2017-08-05 DIAGNOSIS — D631 Anemia in chronic kidney disease: Secondary | ICD-10-CM | POA: Diagnosis not present

## 2017-08-05 DIAGNOSIS — D509 Iron deficiency anemia, unspecified: Secondary | ICD-10-CM | POA: Diagnosis not present

## 2017-08-05 DIAGNOSIS — R51 Headache: Secondary | ICD-10-CM | POA: Diagnosis not present

## 2017-08-05 DIAGNOSIS — Z4931 Encounter for adequacy testing for hemodialysis: Secondary | ICD-10-CM | POA: Diagnosis not present

## 2017-08-07 DIAGNOSIS — D509 Iron deficiency anemia, unspecified: Secondary | ICD-10-CM | POA: Diagnosis not present

## 2017-08-07 DIAGNOSIS — D689 Coagulation defect, unspecified: Secondary | ICD-10-CM | POA: Diagnosis not present

## 2017-08-07 DIAGNOSIS — D631 Anemia in chronic kidney disease: Secondary | ICD-10-CM | POA: Diagnosis not present

## 2017-08-07 DIAGNOSIS — R51 Headache: Secondary | ICD-10-CM | POA: Diagnosis not present

## 2017-08-07 DIAGNOSIS — Z4931 Encounter for adequacy testing for hemodialysis: Secondary | ICD-10-CM | POA: Diagnosis not present

## 2017-08-07 DIAGNOSIS — N2581 Secondary hyperparathyroidism of renal origin: Secondary | ICD-10-CM | POA: Diagnosis not present

## 2017-08-07 DIAGNOSIS — N186 End stage renal disease: Secondary | ICD-10-CM | POA: Diagnosis not present

## 2017-08-09 DIAGNOSIS — R51 Headache: Secondary | ICD-10-CM | POA: Diagnosis not present

## 2017-08-09 DIAGNOSIS — D509 Iron deficiency anemia, unspecified: Secondary | ICD-10-CM | POA: Diagnosis not present

## 2017-08-09 DIAGNOSIS — D631 Anemia in chronic kidney disease: Secondary | ICD-10-CM | POA: Diagnosis not present

## 2017-08-09 DIAGNOSIS — Z4931 Encounter for adequacy testing for hemodialysis: Secondary | ICD-10-CM | POA: Diagnosis not present

## 2017-08-09 DIAGNOSIS — N186 End stage renal disease: Secondary | ICD-10-CM | POA: Diagnosis not present

## 2017-08-09 DIAGNOSIS — N2581 Secondary hyperparathyroidism of renal origin: Secondary | ICD-10-CM | POA: Diagnosis not present

## 2017-08-09 DIAGNOSIS — D689 Coagulation defect, unspecified: Secondary | ICD-10-CM | POA: Diagnosis not present

## 2017-08-11 DIAGNOSIS — Z992 Dependence on renal dialysis: Secondary | ICD-10-CM | POA: Diagnosis not present

## 2017-08-11 DIAGNOSIS — N186 End stage renal disease: Secondary | ICD-10-CM | POA: Diagnosis not present

## 2017-08-11 DIAGNOSIS — E1122 Type 2 diabetes mellitus with diabetic chronic kidney disease: Secondary | ICD-10-CM | POA: Diagnosis not present

## 2017-08-12 DIAGNOSIS — D689 Coagulation defect, unspecified: Secondary | ICD-10-CM | POA: Diagnosis not present

## 2017-08-12 DIAGNOSIS — D631 Anemia in chronic kidney disease: Secondary | ICD-10-CM | POA: Diagnosis not present

## 2017-08-12 DIAGNOSIS — D509 Iron deficiency anemia, unspecified: Secondary | ICD-10-CM | POA: Diagnosis not present

## 2017-08-12 DIAGNOSIS — Z4931 Encounter for adequacy testing for hemodialysis: Secondary | ICD-10-CM | POA: Diagnosis not present

## 2017-08-12 DIAGNOSIS — N186 End stage renal disease: Secondary | ICD-10-CM | POA: Diagnosis not present

## 2017-08-12 DIAGNOSIS — N2581 Secondary hyperparathyroidism of renal origin: Secondary | ICD-10-CM | POA: Diagnosis not present

## 2017-08-14 DIAGNOSIS — Z4931 Encounter for adequacy testing for hemodialysis: Secondary | ICD-10-CM | POA: Diagnosis not present

## 2017-08-14 DIAGNOSIS — N2581 Secondary hyperparathyroidism of renal origin: Secondary | ICD-10-CM | POA: Diagnosis not present

## 2017-08-14 DIAGNOSIS — D631 Anemia in chronic kidney disease: Secondary | ICD-10-CM | POA: Diagnosis not present

## 2017-08-14 DIAGNOSIS — D509 Iron deficiency anemia, unspecified: Secondary | ICD-10-CM | POA: Diagnosis not present

## 2017-08-14 DIAGNOSIS — D689 Coagulation defect, unspecified: Secondary | ICD-10-CM | POA: Diagnosis not present

## 2017-08-14 DIAGNOSIS — N186 End stage renal disease: Secondary | ICD-10-CM | POA: Diagnosis not present

## 2017-08-16 DIAGNOSIS — D631 Anemia in chronic kidney disease: Secondary | ICD-10-CM | POA: Diagnosis not present

## 2017-08-16 DIAGNOSIS — N186 End stage renal disease: Secondary | ICD-10-CM | POA: Diagnosis not present

## 2017-08-16 DIAGNOSIS — N2581 Secondary hyperparathyroidism of renal origin: Secondary | ICD-10-CM | POA: Diagnosis not present

## 2017-08-16 DIAGNOSIS — D689 Coagulation defect, unspecified: Secondary | ICD-10-CM | POA: Diagnosis not present

## 2017-08-16 DIAGNOSIS — Z4931 Encounter for adequacy testing for hemodialysis: Secondary | ICD-10-CM | POA: Diagnosis not present

## 2017-08-16 DIAGNOSIS — D509 Iron deficiency anemia, unspecified: Secondary | ICD-10-CM | POA: Diagnosis not present

## 2017-08-17 DIAGNOSIS — I5033 Acute on chronic diastolic (congestive) heart failure: Secondary | ICD-10-CM | POA: Diagnosis not present

## 2017-08-18 DIAGNOSIS — G610 Guillain-Barre syndrome: Secondary | ICD-10-CM | POA: Diagnosis not present

## 2017-08-18 DIAGNOSIS — J9610 Chronic respiratory failure, unspecified whether with hypoxia or hypercapnia: Secondary | ICD-10-CM | POA: Diagnosis not present

## 2017-08-19 DIAGNOSIS — D631 Anemia in chronic kidney disease: Secondary | ICD-10-CM | POA: Diagnosis not present

## 2017-08-19 DIAGNOSIS — Z4931 Encounter for adequacy testing for hemodialysis: Secondary | ICD-10-CM | POA: Diagnosis not present

## 2017-08-19 DIAGNOSIS — N2581 Secondary hyperparathyroidism of renal origin: Secondary | ICD-10-CM | POA: Diagnosis not present

## 2017-08-19 DIAGNOSIS — D689 Coagulation defect, unspecified: Secondary | ICD-10-CM | POA: Diagnosis not present

## 2017-08-19 DIAGNOSIS — N186 End stage renal disease: Secondary | ICD-10-CM | POA: Diagnosis not present

## 2017-08-19 DIAGNOSIS — D509 Iron deficiency anemia, unspecified: Secondary | ICD-10-CM | POA: Diagnosis not present

## 2017-08-20 DIAGNOSIS — H6591 Unspecified nonsuppurative otitis media, right ear: Secondary | ICD-10-CM | POA: Diagnosis not present

## 2017-08-20 DIAGNOSIS — J9621 Acute and chronic respiratory failure with hypoxia: Secondary | ICD-10-CM | POA: Diagnosis not present

## 2017-08-20 DIAGNOSIS — H906 Mixed conductive and sensorineural hearing loss, bilateral: Secondary | ICD-10-CM | POA: Diagnosis not present

## 2017-08-21 DIAGNOSIS — Z4931 Encounter for adequacy testing for hemodialysis: Secondary | ICD-10-CM | POA: Diagnosis not present

## 2017-08-21 DIAGNOSIS — D631 Anemia in chronic kidney disease: Secondary | ICD-10-CM | POA: Diagnosis not present

## 2017-08-21 DIAGNOSIS — D509 Iron deficiency anemia, unspecified: Secondary | ICD-10-CM | POA: Diagnosis not present

## 2017-08-21 DIAGNOSIS — N186 End stage renal disease: Secondary | ICD-10-CM | POA: Diagnosis not present

## 2017-08-21 DIAGNOSIS — D689 Coagulation defect, unspecified: Secondary | ICD-10-CM | POA: Diagnosis not present

## 2017-08-21 DIAGNOSIS — N2581 Secondary hyperparathyroidism of renal origin: Secondary | ICD-10-CM | POA: Diagnosis not present

## 2017-08-24 DIAGNOSIS — D689 Coagulation defect, unspecified: Secondary | ICD-10-CM | POA: Diagnosis not present

## 2017-08-24 DIAGNOSIS — D509 Iron deficiency anemia, unspecified: Secondary | ICD-10-CM | POA: Diagnosis not present

## 2017-08-24 DIAGNOSIS — N2581 Secondary hyperparathyroidism of renal origin: Secondary | ICD-10-CM | POA: Diagnosis not present

## 2017-08-24 DIAGNOSIS — N186 End stage renal disease: Secondary | ICD-10-CM | POA: Diagnosis not present

## 2017-08-24 DIAGNOSIS — Z4931 Encounter for adequacy testing for hemodialysis: Secondary | ICD-10-CM | POA: Diagnosis not present

## 2017-08-24 DIAGNOSIS — D631 Anemia in chronic kidney disease: Secondary | ICD-10-CM | POA: Diagnosis not present

## 2017-08-26 DIAGNOSIS — Z4931 Encounter for adequacy testing for hemodialysis: Secondary | ICD-10-CM | POA: Diagnosis not present

## 2017-08-26 DIAGNOSIS — N186 End stage renal disease: Secondary | ICD-10-CM | POA: Diagnosis not present

## 2017-08-26 DIAGNOSIS — D689 Coagulation defect, unspecified: Secondary | ICD-10-CM | POA: Diagnosis not present

## 2017-08-26 DIAGNOSIS — N2581 Secondary hyperparathyroidism of renal origin: Secondary | ICD-10-CM | POA: Diagnosis not present

## 2017-08-26 DIAGNOSIS — D509 Iron deficiency anemia, unspecified: Secondary | ICD-10-CM | POA: Diagnosis not present

## 2017-08-26 DIAGNOSIS — D631 Anemia in chronic kidney disease: Secondary | ICD-10-CM | POA: Diagnosis not present

## 2017-08-30 DIAGNOSIS — Z4931 Encounter for adequacy testing for hemodialysis: Secondary | ICD-10-CM | POA: Diagnosis not present

## 2017-08-30 DIAGNOSIS — D689 Coagulation defect, unspecified: Secondary | ICD-10-CM | POA: Diagnosis not present

## 2017-08-30 DIAGNOSIS — N186 End stage renal disease: Secondary | ICD-10-CM | POA: Diagnosis not present

## 2017-08-30 DIAGNOSIS — D509 Iron deficiency anemia, unspecified: Secondary | ICD-10-CM | POA: Diagnosis not present

## 2017-08-30 DIAGNOSIS — N2581 Secondary hyperparathyroidism of renal origin: Secondary | ICD-10-CM | POA: Diagnosis not present

## 2017-08-30 DIAGNOSIS — D631 Anemia in chronic kidney disease: Secondary | ICD-10-CM | POA: Diagnosis not present

## 2017-09-02 DIAGNOSIS — E1122 Type 2 diabetes mellitus with diabetic chronic kidney disease: Secondary | ICD-10-CM | POA: Diagnosis not present

## 2017-09-02 DIAGNOSIS — I4891 Unspecified atrial fibrillation: Secondary | ICD-10-CM | POA: Diagnosis not present

## 2017-09-02 DIAGNOSIS — N185 Chronic kidney disease, stage 5: Secondary | ICD-10-CM | POA: Diagnosis not present

## 2017-09-02 DIAGNOSIS — Z905 Acquired absence of kidney: Secondary | ICD-10-CM | POA: Diagnosis not present

## 2017-09-02 DIAGNOSIS — D509 Iron deficiency anemia, unspecified: Secondary | ICD-10-CM | POA: Diagnosis not present

## 2017-09-02 DIAGNOSIS — Z4931 Encounter for adequacy testing for hemodialysis: Secondary | ICD-10-CM | POA: Diagnosis not present

## 2017-09-02 DIAGNOSIS — Z79899 Other long term (current) drug therapy: Secondary | ICD-10-CM | POA: Diagnosis not present

## 2017-09-02 DIAGNOSIS — I120 Hypertensive chronic kidney disease with stage 5 chronic kidney disease or end stage renal disease: Secondary | ICD-10-CM | POA: Diagnosis not present

## 2017-09-02 DIAGNOSIS — Z992 Dependence on renal dialysis: Secondary | ICD-10-CM | POA: Diagnosis not present

## 2017-09-02 DIAGNOSIS — D631 Anemia in chronic kidney disease: Secondary | ICD-10-CM | POA: Diagnosis not present

## 2017-09-02 DIAGNOSIS — Z7901 Long term (current) use of anticoagulants: Secondary | ICD-10-CM | POA: Diagnosis not present

## 2017-09-02 DIAGNOSIS — D689 Coagulation defect, unspecified: Secondary | ICD-10-CM | POA: Diagnosis not present

## 2017-09-02 DIAGNOSIS — N186 End stage renal disease: Secondary | ICD-10-CM | POA: Diagnosis not present

## 2017-09-02 DIAGNOSIS — N2581 Secondary hyperparathyroidism of renal origin: Secondary | ICD-10-CM | POA: Diagnosis not present

## 2017-09-04 DIAGNOSIS — D689 Coagulation defect, unspecified: Secondary | ICD-10-CM | POA: Diagnosis not present

## 2017-09-04 DIAGNOSIS — D631 Anemia in chronic kidney disease: Secondary | ICD-10-CM | POA: Diagnosis not present

## 2017-09-04 DIAGNOSIS — Z4931 Encounter for adequacy testing for hemodialysis: Secondary | ICD-10-CM | POA: Diagnosis not present

## 2017-09-04 DIAGNOSIS — N186 End stage renal disease: Secondary | ICD-10-CM | POA: Diagnosis not present

## 2017-09-04 DIAGNOSIS — D509 Iron deficiency anemia, unspecified: Secondary | ICD-10-CM | POA: Diagnosis not present

## 2017-09-04 DIAGNOSIS — N2581 Secondary hyperparathyroidism of renal origin: Secondary | ICD-10-CM | POA: Diagnosis not present

## 2017-09-05 DIAGNOSIS — B351 Tinea unguium: Secondary | ICD-10-CM | POA: Diagnosis not present

## 2017-09-05 DIAGNOSIS — I739 Peripheral vascular disease, unspecified: Secondary | ICD-10-CM | POA: Diagnosis not present

## 2017-09-06 DIAGNOSIS — D631 Anemia in chronic kidney disease: Secondary | ICD-10-CM | POA: Diagnosis not present

## 2017-09-06 DIAGNOSIS — D689 Coagulation defect, unspecified: Secondary | ICD-10-CM | POA: Diagnosis not present

## 2017-09-06 DIAGNOSIS — N2581 Secondary hyperparathyroidism of renal origin: Secondary | ICD-10-CM | POA: Diagnosis not present

## 2017-09-06 DIAGNOSIS — Z4931 Encounter for adequacy testing for hemodialysis: Secondary | ICD-10-CM | POA: Diagnosis not present

## 2017-09-06 DIAGNOSIS — N186 End stage renal disease: Secondary | ICD-10-CM | POA: Diagnosis not present

## 2017-09-06 DIAGNOSIS — D509 Iron deficiency anemia, unspecified: Secondary | ICD-10-CM | POA: Diagnosis not present

## 2017-09-09 DIAGNOSIS — N2581 Secondary hyperparathyroidism of renal origin: Secondary | ICD-10-CM | POA: Diagnosis not present

## 2017-09-09 DIAGNOSIS — N186 End stage renal disease: Secondary | ICD-10-CM | POA: Diagnosis not present

## 2017-09-09 DIAGNOSIS — D689 Coagulation defect, unspecified: Secondary | ICD-10-CM | POA: Diagnosis not present

## 2017-09-09 DIAGNOSIS — Z4931 Encounter for adequacy testing for hemodialysis: Secondary | ICD-10-CM | POA: Diagnosis not present

## 2017-09-09 DIAGNOSIS — D631 Anemia in chronic kidney disease: Secondary | ICD-10-CM | POA: Diagnosis not present

## 2017-09-09 DIAGNOSIS — D509 Iron deficiency anemia, unspecified: Secondary | ICD-10-CM | POA: Diagnosis not present

## 2017-09-11 DIAGNOSIS — D689 Coagulation defect, unspecified: Secondary | ICD-10-CM | POA: Diagnosis not present

## 2017-09-11 DIAGNOSIS — N2581 Secondary hyperparathyroidism of renal origin: Secondary | ICD-10-CM | POA: Diagnosis not present

## 2017-09-11 DIAGNOSIS — D509 Iron deficiency anemia, unspecified: Secondary | ICD-10-CM | POA: Diagnosis not present

## 2017-09-11 DIAGNOSIS — Z4931 Encounter for adequacy testing for hemodialysis: Secondary | ICD-10-CM | POA: Diagnosis not present

## 2017-09-11 DIAGNOSIS — Z992 Dependence on renal dialysis: Secondary | ICD-10-CM | POA: Diagnosis not present

## 2017-09-11 DIAGNOSIS — D631 Anemia in chronic kidney disease: Secondary | ICD-10-CM | POA: Diagnosis not present

## 2017-09-11 DIAGNOSIS — E1122 Type 2 diabetes mellitus with diabetic chronic kidney disease: Secondary | ICD-10-CM | POA: Diagnosis not present

## 2017-09-11 DIAGNOSIS — N186 End stage renal disease: Secondary | ICD-10-CM | POA: Diagnosis not present

## 2017-09-13 DIAGNOSIS — N186 End stage renal disease: Secondary | ICD-10-CM | POA: Diagnosis not present

## 2017-09-13 DIAGNOSIS — D509 Iron deficiency anemia, unspecified: Secondary | ICD-10-CM | POA: Diagnosis not present

## 2017-09-13 DIAGNOSIS — D689 Coagulation defect, unspecified: Secondary | ICD-10-CM | POA: Diagnosis not present

## 2017-09-13 DIAGNOSIS — N2581 Secondary hyperparathyroidism of renal origin: Secondary | ICD-10-CM | POA: Diagnosis not present

## 2017-09-13 DIAGNOSIS — Z4931 Encounter for adequacy testing for hemodialysis: Secondary | ICD-10-CM | POA: Diagnosis not present

## 2017-09-13 DIAGNOSIS — D631 Anemia in chronic kidney disease: Secondary | ICD-10-CM | POA: Diagnosis not present

## 2017-09-16 DIAGNOSIS — N186 End stage renal disease: Secondary | ICD-10-CM | POA: Diagnosis not present

## 2017-09-16 DIAGNOSIS — D689 Coagulation defect, unspecified: Secondary | ICD-10-CM | POA: Diagnosis not present

## 2017-09-16 DIAGNOSIS — D509 Iron deficiency anemia, unspecified: Secondary | ICD-10-CM | POA: Diagnosis not present

## 2017-09-16 DIAGNOSIS — D631 Anemia in chronic kidney disease: Secondary | ICD-10-CM | POA: Diagnosis not present

## 2017-09-16 DIAGNOSIS — Z4931 Encounter for adequacy testing for hemodialysis: Secondary | ICD-10-CM | POA: Diagnosis not present

## 2017-09-16 DIAGNOSIS — N2581 Secondary hyperparathyroidism of renal origin: Secondary | ICD-10-CM | POA: Diagnosis not present

## 2017-09-17 DIAGNOSIS — I5033 Acute on chronic diastolic (congestive) heart failure: Secondary | ICD-10-CM | POA: Diagnosis not present

## 2017-09-18 DIAGNOSIS — J9610 Chronic respiratory failure, unspecified whether with hypoxia or hypercapnia: Secondary | ICD-10-CM | POA: Diagnosis not present

## 2017-09-18 DIAGNOSIS — D509 Iron deficiency anemia, unspecified: Secondary | ICD-10-CM | POA: Diagnosis not present

## 2017-09-18 DIAGNOSIS — N2581 Secondary hyperparathyroidism of renal origin: Secondary | ICD-10-CM | POA: Diagnosis not present

## 2017-09-18 DIAGNOSIS — D631 Anemia in chronic kidney disease: Secondary | ICD-10-CM | POA: Diagnosis not present

## 2017-09-18 DIAGNOSIS — G610 Guillain-Barre syndrome: Secondary | ICD-10-CM | POA: Diagnosis not present

## 2017-09-18 DIAGNOSIS — Z4931 Encounter for adequacy testing for hemodialysis: Secondary | ICD-10-CM | POA: Diagnosis not present

## 2017-09-18 DIAGNOSIS — N186 End stage renal disease: Secondary | ICD-10-CM | POA: Diagnosis not present

## 2017-09-18 DIAGNOSIS — D689 Coagulation defect, unspecified: Secondary | ICD-10-CM | POA: Diagnosis not present

## 2017-09-20 DIAGNOSIS — N2581 Secondary hyperparathyroidism of renal origin: Secondary | ICD-10-CM | POA: Diagnosis not present

## 2017-09-20 DIAGNOSIS — N186 End stage renal disease: Secondary | ICD-10-CM | POA: Diagnosis not present

## 2017-09-20 DIAGNOSIS — D509 Iron deficiency anemia, unspecified: Secondary | ICD-10-CM | POA: Diagnosis not present

## 2017-09-20 DIAGNOSIS — Z4931 Encounter for adequacy testing for hemodialysis: Secondary | ICD-10-CM | POA: Diagnosis not present

## 2017-09-20 DIAGNOSIS — D689 Coagulation defect, unspecified: Secondary | ICD-10-CM | POA: Diagnosis not present

## 2017-09-20 DIAGNOSIS — D631 Anemia in chronic kidney disease: Secondary | ICD-10-CM | POA: Diagnosis not present

## 2017-09-23 DIAGNOSIS — Z4931 Encounter for adequacy testing for hemodialysis: Secondary | ICD-10-CM | POA: Diagnosis not present

## 2017-09-23 DIAGNOSIS — N186 End stage renal disease: Secondary | ICD-10-CM | POA: Diagnosis not present

## 2017-09-23 DIAGNOSIS — N2581 Secondary hyperparathyroidism of renal origin: Secondary | ICD-10-CM | POA: Diagnosis not present

## 2017-09-23 DIAGNOSIS — D689 Coagulation defect, unspecified: Secondary | ICD-10-CM | POA: Diagnosis not present

## 2017-09-23 DIAGNOSIS — D509 Iron deficiency anemia, unspecified: Secondary | ICD-10-CM | POA: Diagnosis not present

## 2017-09-23 DIAGNOSIS — D631 Anemia in chronic kidney disease: Secondary | ICD-10-CM | POA: Diagnosis not present

## 2017-09-25 DIAGNOSIS — N2581 Secondary hyperparathyroidism of renal origin: Secondary | ICD-10-CM | POA: Diagnosis not present

## 2017-09-25 DIAGNOSIS — D689 Coagulation defect, unspecified: Secondary | ICD-10-CM | POA: Diagnosis not present

## 2017-09-25 DIAGNOSIS — D509 Iron deficiency anemia, unspecified: Secondary | ICD-10-CM | POA: Diagnosis not present

## 2017-09-25 DIAGNOSIS — Z4931 Encounter for adequacy testing for hemodialysis: Secondary | ICD-10-CM | POA: Diagnosis not present

## 2017-09-25 DIAGNOSIS — D631 Anemia in chronic kidney disease: Secondary | ICD-10-CM | POA: Diagnosis not present

## 2017-09-25 DIAGNOSIS — E1122 Type 2 diabetes mellitus with diabetic chronic kidney disease: Secondary | ICD-10-CM | POA: Diagnosis not present

## 2017-09-25 DIAGNOSIS — N186 End stage renal disease: Secondary | ICD-10-CM | POA: Diagnosis not present

## 2017-09-27 DIAGNOSIS — Z4931 Encounter for adequacy testing for hemodialysis: Secondary | ICD-10-CM | POA: Diagnosis not present

## 2017-09-27 DIAGNOSIS — D689 Coagulation defect, unspecified: Secondary | ICD-10-CM | POA: Diagnosis not present

## 2017-09-27 DIAGNOSIS — N2581 Secondary hyperparathyroidism of renal origin: Secondary | ICD-10-CM | POA: Diagnosis not present

## 2017-09-27 DIAGNOSIS — D509 Iron deficiency anemia, unspecified: Secondary | ICD-10-CM | POA: Diagnosis not present

## 2017-09-27 DIAGNOSIS — D631 Anemia in chronic kidney disease: Secondary | ICD-10-CM | POA: Diagnosis not present

## 2017-09-27 DIAGNOSIS — N186 End stage renal disease: Secondary | ICD-10-CM | POA: Diagnosis not present

## 2017-10-01 DIAGNOSIS — D631 Anemia in chronic kidney disease: Secondary | ICD-10-CM | POA: Diagnosis not present

## 2017-10-01 DIAGNOSIS — D689 Coagulation defect, unspecified: Secondary | ICD-10-CM | POA: Diagnosis not present

## 2017-10-01 DIAGNOSIS — Z4931 Encounter for adequacy testing for hemodialysis: Secondary | ICD-10-CM | POA: Diagnosis not present

## 2017-10-01 DIAGNOSIS — N186 End stage renal disease: Secondary | ICD-10-CM | POA: Diagnosis not present

## 2017-10-01 DIAGNOSIS — N2581 Secondary hyperparathyroidism of renal origin: Secondary | ICD-10-CM | POA: Diagnosis not present

## 2017-10-01 DIAGNOSIS — D509 Iron deficiency anemia, unspecified: Secondary | ICD-10-CM | POA: Diagnosis not present

## 2017-10-03 DIAGNOSIS — N186 End stage renal disease: Secondary | ICD-10-CM | POA: Diagnosis not present

## 2017-10-03 DIAGNOSIS — D509 Iron deficiency anemia, unspecified: Secondary | ICD-10-CM | POA: Diagnosis not present

## 2017-10-03 DIAGNOSIS — N2581 Secondary hyperparathyroidism of renal origin: Secondary | ICD-10-CM | POA: Diagnosis not present

## 2017-10-03 DIAGNOSIS — Z4931 Encounter for adequacy testing for hemodialysis: Secondary | ICD-10-CM | POA: Diagnosis not present

## 2017-10-03 DIAGNOSIS — D631 Anemia in chronic kidney disease: Secondary | ICD-10-CM | POA: Diagnosis not present

## 2017-10-03 DIAGNOSIS — D689 Coagulation defect, unspecified: Secondary | ICD-10-CM | POA: Diagnosis not present

## 2017-10-04 DIAGNOSIS — D689 Coagulation defect, unspecified: Secondary | ICD-10-CM | POA: Diagnosis not present

## 2017-10-04 DIAGNOSIS — Z79899 Other long term (current) drug therapy: Secondary | ICD-10-CM | POA: Diagnosis not present

## 2017-10-04 DIAGNOSIS — E1122 Type 2 diabetes mellitus with diabetic chronic kidney disease: Secondary | ICD-10-CM | POA: Diagnosis not present

## 2017-10-04 DIAGNOSIS — N186 End stage renal disease: Secondary | ICD-10-CM | POA: Diagnosis not present

## 2017-10-04 DIAGNOSIS — D509 Iron deficiency anemia, unspecified: Secondary | ICD-10-CM | POA: Diagnosis not present

## 2017-10-04 DIAGNOSIS — I120 Hypertensive chronic kidney disease with stage 5 chronic kidney disease or end stage renal disease: Secondary | ICD-10-CM | POA: Diagnosis not present

## 2017-10-04 DIAGNOSIS — Z4931 Encounter for adequacy testing for hemodialysis: Secondary | ICD-10-CM | POA: Diagnosis not present

## 2017-10-04 DIAGNOSIS — M19011 Primary osteoarthritis, right shoulder: Secondary | ICD-10-CM | POA: Diagnosis not present

## 2017-10-04 DIAGNOSIS — R002 Palpitations: Secondary | ICD-10-CM | POA: Diagnosis not present

## 2017-10-04 DIAGNOSIS — Z992 Dependence on renal dialysis: Secondary | ICD-10-CM | POA: Diagnosis not present

## 2017-10-04 DIAGNOSIS — Z905 Acquired absence of kidney: Secondary | ICD-10-CM | POA: Diagnosis not present

## 2017-10-04 DIAGNOSIS — M25511 Pain in right shoulder: Secondary | ICD-10-CM | POA: Diagnosis not present

## 2017-10-04 DIAGNOSIS — R0789 Other chest pain: Secondary | ICD-10-CM | POA: Diagnosis not present

## 2017-10-04 DIAGNOSIS — R079 Chest pain, unspecified: Secondary | ICD-10-CM | POA: Diagnosis not present

## 2017-10-04 DIAGNOSIS — D631 Anemia in chronic kidney disease: Secondary | ICD-10-CM | POA: Diagnosis not present

## 2017-10-04 DIAGNOSIS — Z885 Allergy status to narcotic agent status: Secondary | ICD-10-CM | POA: Diagnosis not present

## 2017-10-04 DIAGNOSIS — N2581 Secondary hyperparathyroidism of renal origin: Secondary | ICD-10-CM | POA: Diagnosis not present

## 2017-10-07 DIAGNOSIS — N2581 Secondary hyperparathyroidism of renal origin: Secondary | ICD-10-CM | POA: Diagnosis not present

## 2017-10-07 DIAGNOSIS — D689 Coagulation defect, unspecified: Secondary | ICD-10-CM | POA: Diagnosis not present

## 2017-10-07 DIAGNOSIS — D509 Iron deficiency anemia, unspecified: Secondary | ICD-10-CM | POA: Diagnosis not present

## 2017-10-07 DIAGNOSIS — D631 Anemia in chronic kidney disease: Secondary | ICD-10-CM | POA: Diagnosis not present

## 2017-10-07 DIAGNOSIS — Z4931 Encounter for adequacy testing for hemodialysis: Secondary | ICD-10-CM | POA: Diagnosis not present

## 2017-10-07 DIAGNOSIS — N186 End stage renal disease: Secondary | ICD-10-CM | POA: Diagnosis not present

## 2017-10-09 DIAGNOSIS — D689 Coagulation defect, unspecified: Secondary | ICD-10-CM | POA: Diagnosis not present

## 2017-10-09 DIAGNOSIS — N2581 Secondary hyperparathyroidism of renal origin: Secondary | ICD-10-CM | POA: Diagnosis not present

## 2017-10-09 DIAGNOSIS — N186 End stage renal disease: Secondary | ICD-10-CM | POA: Diagnosis not present

## 2017-10-09 DIAGNOSIS — D631 Anemia in chronic kidney disease: Secondary | ICD-10-CM | POA: Diagnosis not present

## 2017-10-09 DIAGNOSIS — D509 Iron deficiency anemia, unspecified: Secondary | ICD-10-CM | POA: Diagnosis not present

## 2017-10-09 DIAGNOSIS — Z4931 Encounter for adequacy testing for hemodialysis: Secondary | ICD-10-CM | POA: Diagnosis not present

## 2017-10-11 DIAGNOSIS — N186 End stage renal disease: Secondary | ICD-10-CM | POA: Diagnosis not present

## 2017-10-11 DIAGNOSIS — D631 Anemia in chronic kidney disease: Secondary | ICD-10-CM | POA: Diagnosis not present

## 2017-10-11 DIAGNOSIS — D509 Iron deficiency anemia, unspecified: Secondary | ICD-10-CM | POA: Diagnosis not present

## 2017-10-11 DIAGNOSIS — D689 Coagulation defect, unspecified: Secondary | ICD-10-CM | POA: Diagnosis not present

## 2017-10-11 DIAGNOSIS — Z4931 Encounter for adequacy testing for hemodialysis: Secondary | ICD-10-CM | POA: Diagnosis not present

## 2017-10-11 DIAGNOSIS — N2581 Secondary hyperparathyroidism of renal origin: Secondary | ICD-10-CM | POA: Diagnosis not present

## 2017-10-12 DIAGNOSIS — Z992 Dependence on renal dialysis: Secondary | ICD-10-CM | POA: Diagnosis not present

## 2017-10-12 DIAGNOSIS — N186 End stage renal disease: Secondary | ICD-10-CM | POA: Diagnosis not present

## 2017-10-12 DIAGNOSIS — E1122 Type 2 diabetes mellitus with diabetic chronic kidney disease: Secondary | ICD-10-CM | POA: Diagnosis not present

## 2017-10-14 DIAGNOSIS — D631 Anemia in chronic kidney disease: Secondary | ICD-10-CM | POA: Diagnosis not present

## 2017-10-14 DIAGNOSIS — N186 End stage renal disease: Secondary | ICD-10-CM | POA: Diagnosis not present

## 2017-10-14 DIAGNOSIS — Z4931 Encounter for adequacy testing for hemodialysis: Secondary | ICD-10-CM | POA: Diagnosis not present

## 2017-10-14 DIAGNOSIS — R6883 Chills (without fever): Secondary | ICD-10-CM | POA: Diagnosis not present

## 2017-10-14 DIAGNOSIS — D689 Coagulation defect, unspecified: Secondary | ICD-10-CM | POA: Diagnosis not present

## 2017-10-14 DIAGNOSIS — D509 Iron deficiency anemia, unspecified: Secondary | ICD-10-CM | POA: Diagnosis not present

## 2017-10-14 DIAGNOSIS — N2581 Secondary hyperparathyroidism of renal origin: Secondary | ICD-10-CM | POA: Diagnosis not present

## 2017-10-14 DIAGNOSIS — R51 Headache: Secondary | ICD-10-CM | POA: Diagnosis not present

## 2017-10-15 DIAGNOSIS — E1122 Type 2 diabetes mellitus with diabetic chronic kidney disease: Secondary | ICD-10-CM | POA: Diagnosis not present

## 2017-10-15 DIAGNOSIS — Z794 Long term (current) use of insulin: Secondary | ICD-10-CM | POA: Diagnosis not present

## 2017-10-15 DIAGNOSIS — N184 Chronic kidney disease, stage 4 (severe): Secondary | ICD-10-CM | POA: Diagnosis not present

## 2017-10-15 DIAGNOSIS — I4891 Unspecified atrial fibrillation: Secondary | ICD-10-CM | POA: Diagnosis not present

## 2017-10-16 DIAGNOSIS — R51 Headache: Secondary | ICD-10-CM | POA: Diagnosis not present

## 2017-10-16 DIAGNOSIS — D509 Iron deficiency anemia, unspecified: Secondary | ICD-10-CM | POA: Diagnosis not present

## 2017-10-16 DIAGNOSIS — R6883 Chills (without fever): Secondary | ICD-10-CM | POA: Diagnosis not present

## 2017-10-16 DIAGNOSIS — D689 Coagulation defect, unspecified: Secondary | ICD-10-CM | POA: Diagnosis not present

## 2017-10-16 DIAGNOSIS — D631 Anemia in chronic kidney disease: Secondary | ICD-10-CM | POA: Diagnosis not present

## 2017-10-16 DIAGNOSIS — Z4931 Encounter for adequacy testing for hemodialysis: Secondary | ICD-10-CM | POA: Diagnosis not present

## 2017-10-16 DIAGNOSIS — N2581 Secondary hyperparathyroidism of renal origin: Secondary | ICD-10-CM | POA: Diagnosis not present

## 2017-10-16 DIAGNOSIS — N186 End stage renal disease: Secondary | ICD-10-CM | POA: Diagnosis not present

## 2017-10-18 DIAGNOSIS — D509 Iron deficiency anemia, unspecified: Secondary | ICD-10-CM | POA: Diagnosis not present

## 2017-10-18 DIAGNOSIS — I5033 Acute on chronic diastolic (congestive) heart failure: Secondary | ICD-10-CM | POA: Diagnosis not present

## 2017-10-18 DIAGNOSIS — R6883 Chills (without fever): Secondary | ICD-10-CM | POA: Diagnosis not present

## 2017-10-18 DIAGNOSIS — D689 Coagulation defect, unspecified: Secondary | ICD-10-CM | POA: Diagnosis not present

## 2017-10-18 DIAGNOSIS — N2581 Secondary hyperparathyroidism of renal origin: Secondary | ICD-10-CM | POA: Diagnosis not present

## 2017-10-18 DIAGNOSIS — R51 Headache: Secondary | ICD-10-CM | POA: Diagnosis not present

## 2017-10-18 DIAGNOSIS — Z4931 Encounter for adequacy testing for hemodialysis: Secondary | ICD-10-CM | POA: Diagnosis not present

## 2017-10-18 DIAGNOSIS — D631 Anemia in chronic kidney disease: Secondary | ICD-10-CM | POA: Diagnosis not present

## 2017-10-18 DIAGNOSIS — N186 End stage renal disease: Secondary | ICD-10-CM | POA: Diagnosis not present

## 2017-10-19 DIAGNOSIS — J9610 Chronic respiratory failure, unspecified whether with hypoxia or hypercapnia: Secondary | ICD-10-CM | POA: Diagnosis not present

## 2017-10-19 DIAGNOSIS — G610 Guillain-Barre syndrome: Secondary | ICD-10-CM | POA: Diagnosis not present

## 2017-10-21 DIAGNOSIS — D689 Coagulation defect, unspecified: Secondary | ICD-10-CM | POA: Diagnosis not present

## 2017-10-21 DIAGNOSIS — D509 Iron deficiency anemia, unspecified: Secondary | ICD-10-CM | POA: Diagnosis not present

## 2017-10-21 DIAGNOSIS — N186 End stage renal disease: Secondary | ICD-10-CM | POA: Diagnosis not present

## 2017-10-21 DIAGNOSIS — R51 Headache: Secondary | ICD-10-CM | POA: Diagnosis not present

## 2017-10-21 DIAGNOSIS — N2581 Secondary hyperparathyroidism of renal origin: Secondary | ICD-10-CM | POA: Diagnosis not present

## 2017-10-21 DIAGNOSIS — Z4931 Encounter for adequacy testing for hemodialysis: Secondary | ICD-10-CM | POA: Diagnosis not present

## 2017-10-21 DIAGNOSIS — D631 Anemia in chronic kidney disease: Secondary | ICD-10-CM | POA: Diagnosis not present

## 2017-10-21 DIAGNOSIS — R6883 Chills (without fever): Secondary | ICD-10-CM | POA: Diagnosis not present

## 2017-10-22 DIAGNOSIS — H6521 Chronic serous otitis media, right ear: Secondary | ICD-10-CM | POA: Diagnosis not present

## 2017-10-23 DIAGNOSIS — Z4931 Encounter for adequacy testing for hemodialysis: Secondary | ICD-10-CM | POA: Diagnosis not present

## 2017-10-23 DIAGNOSIS — N2581 Secondary hyperparathyroidism of renal origin: Secondary | ICD-10-CM | POA: Diagnosis not present

## 2017-10-23 DIAGNOSIS — D509 Iron deficiency anemia, unspecified: Secondary | ICD-10-CM | POA: Diagnosis not present

## 2017-10-23 DIAGNOSIS — N186 End stage renal disease: Secondary | ICD-10-CM | POA: Diagnosis not present

## 2017-10-23 DIAGNOSIS — R51 Headache: Secondary | ICD-10-CM | POA: Diagnosis not present

## 2017-10-23 DIAGNOSIS — R6883 Chills (without fever): Secondary | ICD-10-CM | POA: Diagnosis not present

## 2017-10-23 DIAGNOSIS — E7800 Pure hypercholesterolemia, unspecified: Secondary | ICD-10-CM | POA: Diagnosis not present

## 2017-10-23 DIAGNOSIS — D631 Anemia in chronic kidney disease: Secondary | ICD-10-CM | POA: Diagnosis not present

## 2017-10-23 DIAGNOSIS — D689 Coagulation defect, unspecified: Secondary | ICD-10-CM | POA: Diagnosis not present

## 2017-10-24 DIAGNOSIS — E1122 Type 2 diabetes mellitus with diabetic chronic kidney disease: Secondary | ICD-10-CM | POA: Diagnosis not present

## 2017-10-24 DIAGNOSIS — N186 End stage renal disease: Secondary | ICD-10-CM | POA: Diagnosis not present

## 2017-10-24 DIAGNOSIS — Z992 Dependence on renal dialysis: Secondary | ICD-10-CM | POA: Diagnosis not present

## 2017-10-24 DIAGNOSIS — Z01818 Encounter for other preprocedural examination: Secondary | ICD-10-CM | POA: Diagnosis not present

## 2017-10-24 DIAGNOSIS — G610 Guillain-Barre syndrome: Secondary | ICD-10-CM | POA: Diagnosis not present

## 2017-10-24 DIAGNOSIS — Z794 Long term (current) use of insulin: Secondary | ICD-10-CM | POA: Diagnosis not present

## 2017-10-24 DIAGNOSIS — I509 Heart failure, unspecified: Secondary | ICD-10-CM | POA: Diagnosis not present

## 2017-10-25 DIAGNOSIS — D631 Anemia in chronic kidney disease: Secondary | ICD-10-CM | POA: Diagnosis not present

## 2017-10-25 DIAGNOSIS — D509 Iron deficiency anemia, unspecified: Secondary | ICD-10-CM | POA: Diagnosis not present

## 2017-10-25 DIAGNOSIS — R51 Headache: Secondary | ICD-10-CM | POA: Diagnosis not present

## 2017-10-25 DIAGNOSIS — N186 End stage renal disease: Secondary | ICD-10-CM | POA: Diagnosis not present

## 2017-10-25 DIAGNOSIS — R6883 Chills (without fever): Secondary | ICD-10-CM | POA: Diagnosis not present

## 2017-10-25 DIAGNOSIS — Z4931 Encounter for adequacy testing for hemodialysis: Secondary | ICD-10-CM | POA: Diagnosis not present

## 2017-10-25 DIAGNOSIS — N2581 Secondary hyperparathyroidism of renal origin: Secondary | ICD-10-CM | POA: Diagnosis not present

## 2017-10-25 DIAGNOSIS — D689 Coagulation defect, unspecified: Secondary | ICD-10-CM | POA: Diagnosis not present

## 2017-10-28 DIAGNOSIS — N2581 Secondary hyperparathyroidism of renal origin: Secondary | ICD-10-CM | POA: Diagnosis not present

## 2017-10-28 DIAGNOSIS — N186 End stage renal disease: Secondary | ICD-10-CM | POA: Diagnosis not present

## 2017-10-28 DIAGNOSIS — D631 Anemia in chronic kidney disease: Secondary | ICD-10-CM | POA: Diagnosis not present

## 2017-10-28 DIAGNOSIS — R6883 Chills (without fever): Secondary | ICD-10-CM | POA: Diagnosis not present

## 2017-10-28 DIAGNOSIS — D689 Coagulation defect, unspecified: Secondary | ICD-10-CM | POA: Diagnosis not present

## 2017-10-28 DIAGNOSIS — R51 Headache: Secondary | ICD-10-CM | POA: Diagnosis not present

## 2017-10-28 DIAGNOSIS — Z4931 Encounter for adequacy testing for hemodialysis: Secondary | ICD-10-CM | POA: Diagnosis not present

## 2017-10-28 DIAGNOSIS — D509 Iron deficiency anemia, unspecified: Secondary | ICD-10-CM | POA: Diagnosis not present

## 2017-10-30 DIAGNOSIS — D689 Coagulation defect, unspecified: Secondary | ICD-10-CM | POA: Diagnosis not present

## 2017-10-30 DIAGNOSIS — R51 Headache: Secondary | ICD-10-CM | POA: Diagnosis not present

## 2017-10-30 DIAGNOSIS — R6883 Chills (without fever): Secondary | ICD-10-CM | POA: Diagnosis not present

## 2017-10-30 DIAGNOSIS — D631 Anemia in chronic kidney disease: Secondary | ICD-10-CM | POA: Diagnosis not present

## 2017-10-30 DIAGNOSIS — N186 End stage renal disease: Secondary | ICD-10-CM | POA: Diagnosis not present

## 2017-10-30 DIAGNOSIS — N2581 Secondary hyperparathyroidism of renal origin: Secondary | ICD-10-CM | POA: Diagnosis not present

## 2017-10-30 DIAGNOSIS — Z4931 Encounter for adequacy testing for hemodialysis: Secondary | ICD-10-CM | POA: Diagnosis not present

## 2017-10-30 DIAGNOSIS — D509 Iron deficiency anemia, unspecified: Secondary | ICD-10-CM | POA: Diagnosis not present

## 2017-10-31 DIAGNOSIS — N186 End stage renal disease: Secondary | ICD-10-CM | POA: Diagnosis not present

## 2017-10-31 DIAGNOSIS — Z4931 Encounter for adequacy testing for hemodialysis: Secondary | ICD-10-CM | POA: Diagnosis not present

## 2017-10-31 DIAGNOSIS — N2581 Secondary hyperparathyroidism of renal origin: Secondary | ICD-10-CM | POA: Diagnosis not present

## 2017-10-31 DIAGNOSIS — R51 Headache: Secondary | ICD-10-CM | POA: Diagnosis not present

## 2017-10-31 DIAGNOSIS — R6883 Chills (without fever): Secondary | ICD-10-CM | POA: Diagnosis not present

## 2017-10-31 DIAGNOSIS — D689 Coagulation defect, unspecified: Secondary | ICD-10-CM | POA: Diagnosis not present

## 2017-10-31 DIAGNOSIS — D509 Iron deficiency anemia, unspecified: Secondary | ICD-10-CM | POA: Diagnosis not present

## 2017-10-31 DIAGNOSIS — D631 Anemia in chronic kidney disease: Secondary | ICD-10-CM | POA: Diagnosis not present

## 2017-11-01 DIAGNOSIS — Z794 Long term (current) use of insulin: Secondary | ICD-10-CM | POA: Diagnosis not present

## 2017-11-01 DIAGNOSIS — R29898 Other symptoms and signs involving the musculoskeletal system: Secondary | ICD-10-CM | POA: Diagnosis not present

## 2017-11-01 DIAGNOSIS — G4730 Sleep apnea, unspecified: Secondary | ICD-10-CM | POA: Diagnosis not present

## 2017-11-01 DIAGNOSIS — Z79899 Other long term (current) drug therapy: Secondary | ICD-10-CM | POA: Diagnosis not present

## 2017-11-01 DIAGNOSIS — Z905 Acquired absence of kidney: Secondary | ICD-10-CM | POA: Diagnosis not present

## 2017-11-01 DIAGNOSIS — D631 Anemia in chronic kidney disease: Secondary | ICD-10-CM | POA: Diagnosis not present

## 2017-11-01 DIAGNOSIS — Z880 Allergy status to penicillin: Secondary | ICD-10-CM | POA: Diagnosis not present

## 2017-11-01 DIAGNOSIS — Z85528 Personal history of other malignant neoplasm of kidney: Secondary | ICD-10-CM | POA: Diagnosis not present

## 2017-11-01 DIAGNOSIS — Z992 Dependence on renal dialysis: Secondary | ICD-10-CM | POA: Diagnosis not present

## 2017-11-01 DIAGNOSIS — E1122 Type 2 diabetes mellitus with diabetic chronic kidney disease: Secondary | ICD-10-CM | POA: Diagnosis not present

## 2017-11-01 DIAGNOSIS — I132 Hypertensive heart and chronic kidney disease with heart failure and with stage 5 chronic kidney disease, or end stage renal disease: Secondary | ICD-10-CM | POA: Diagnosis not present

## 2017-11-01 DIAGNOSIS — Z9981 Dependence on supplemental oxygen: Secondary | ICD-10-CM | POA: Diagnosis not present

## 2017-11-01 DIAGNOSIS — N186 End stage renal disease: Secondary | ICD-10-CM | POA: Diagnosis not present

## 2017-11-01 DIAGNOSIS — Z993 Dependence on wheelchair: Secondary | ICD-10-CM | POA: Diagnosis not present

## 2017-11-01 DIAGNOSIS — R110 Nausea: Secondary | ICD-10-CM | POA: Diagnosis not present

## 2017-11-01 DIAGNOSIS — Z885 Allergy status to narcotic agent status: Secondary | ICD-10-CM | POA: Diagnosis not present

## 2017-11-01 DIAGNOSIS — Z888 Allergy status to other drugs, medicaments and biological substances status: Secondary | ICD-10-CM | POA: Diagnosis not present

## 2017-11-01 DIAGNOSIS — I5030 Unspecified diastolic (congestive) heart failure: Secondary | ICD-10-CM | POA: Diagnosis not present

## 2017-11-01 DIAGNOSIS — I4891 Unspecified atrial fibrillation: Secondary | ICD-10-CM | POA: Diagnosis not present

## 2017-11-01 DIAGNOSIS — Z87891 Personal history of nicotine dependence: Secondary | ICD-10-CM | POA: Diagnosis not present

## 2017-11-01 DIAGNOSIS — J9611 Chronic respiratory failure with hypoxia: Secondary | ICD-10-CM | POA: Diagnosis not present

## 2017-11-01 DIAGNOSIS — Z7901 Long term (current) use of anticoagulants: Secondary | ICD-10-CM | POA: Diagnosis not present

## 2017-11-04 DIAGNOSIS — D509 Iron deficiency anemia, unspecified: Secondary | ICD-10-CM | POA: Diagnosis not present

## 2017-11-04 DIAGNOSIS — N186 End stage renal disease: Secondary | ICD-10-CM | POA: Diagnosis not present

## 2017-11-04 DIAGNOSIS — D631 Anemia in chronic kidney disease: Secondary | ICD-10-CM | POA: Diagnosis not present

## 2017-11-04 DIAGNOSIS — R6883 Chills (without fever): Secondary | ICD-10-CM | POA: Diagnosis not present

## 2017-11-04 DIAGNOSIS — Z4931 Encounter for adequacy testing for hemodialysis: Secondary | ICD-10-CM | POA: Diagnosis not present

## 2017-11-04 DIAGNOSIS — D689 Coagulation defect, unspecified: Secondary | ICD-10-CM | POA: Diagnosis not present

## 2017-11-04 DIAGNOSIS — R51 Headache: Secondary | ICD-10-CM | POA: Diagnosis not present

## 2017-11-04 DIAGNOSIS — N2581 Secondary hyperparathyroidism of renal origin: Secondary | ICD-10-CM | POA: Diagnosis not present

## 2017-11-08 DIAGNOSIS — D689 Coagulation defect, unspecified: Secondary | ICD-10-CM | POA: Diagnosis not present

## 2017-11-08 DIAGNOSIS — D631 Anemia in chronic kidney disease: Secondary | ICD-10-CM | POA: Diagnosis not present

## 2017-11-08 DIAGNOSIS — N186 End stage renal disease: Secondary | ICD-10-CM | POA: Diagnosis not present

## 2017-11-08 DIAGNOSIS — D509 Iron deficiency anemia, unspecified: Secondary | ICD-10-CM | POA: Diagnosis not present

## 2017-11-08 DIAGNOSIS — Z4931 Encounter for adequacy testing for hemodialysis: Secondary | ICD-10-CM | POA: Diagnosis not present

## 2017-11-08 DIAGNOSIS — R6883 Chills (without fever): Secondary | ICD-10-CM | POA: Diagnosis not present

## 2017-11-08 DIAGNOSIS — R51 Headache: Secondary | ICD-10-CM | POA: Diagnosis not present

## 2017-11-08 DIAGNOSIS — N2581 Secondary hyperparathyroidism of renal origin: Secondary | ICD-10-CM | POA: Diagnosis not present

## 2017-11-11 DIAGNOSIS — D509 Iron deficiency anemia, unspecified: Secondary | ICD-10-CM | POA: Diagnosis not present

## 2017-11-11 DIAGNOSIS — D689 Coagulation defect, unspecified: Secondary | ICD-10-CM | POA: Diagnosis not present

## 2017-11-11 DIAGNOSIS — D631 Anemia in chronic kidney disease: Secondary | ICD-10-CM | POA: Diagnosis not present

## 2017-11-11 DIAGNOSIS — E1122 Type 2 diabetes mellitus with diabetic chronic kidney disease: Secondary | ICD-10-CM | POA: Diagnosis not present

## 2017-11-11 DIAGNOSIS — N2581 Secondary hyperparathyroidism of renal origin: Secondary | ICD-10-CM | POA: Diagnosis not present

## 2017-11-11 DIAGNOSIS — R51 Headache: Secondary | ICD-10-CM | POA: Diagnosis not present

## 2017-11-11 DIAGNOSIS — R6883 Chills (without fever): Secondary | ICD-10-CM | POA: Diagnosis not present

## 2017-11-11 DIAGNOSIS — Z4931 Encounter for adequacy testing for hemodialysis: Secondary | ICD-10-CM | POA: Diagnosis not present

## 2017-11-11 DIAGNOSIS — Z992 Dependence on renal dialysis: Secondary | ICD-10-CM | POA: Diagnosis not present

## 2017-11-11 DIAGNOSIS — N186 End stage renal disease: Secondary | ICD-10-CM | POA: Diagnosis not present

## 2017-11-13 DIAGNOSIS — N2581 Secondary hyperparathyroidism of renal origin: Secondary | ICD-10-CM | POA: Diagnosis not present

## 2017-11-13 DIAGNOSIS — D509 Iron deficiency anemia, unspecified: Secondary | ICD-10-CM | POA: Diagnosis not present

## 2017-11-13 DIAGNOSIS — D631 Anemia in chronic kidney disease: Secondary | ICD-10-CM | POA: Diagnosis not present

## 2017-11-13 DIAGNOSIS — N186 End stage renal disease: Secondary | ICD-10-CM | POA: Diagnosis not present

## 2017-11-13 DIAGNOSIS — D689 Coagulation defect, unspecified: Secondary | ICD-10-CM | POA: Diagnosis not present

## 2017-11-13 DIAGNOSIS — Z4931 Encounter for adequacy testing for hemodialysis: Secondary | ICD-10-CM | POA: Diagnosis not present

## 2017-11-14 DIAGNOSIS — G610 Guillain-Barre syndrome: Secondary | ICD-10-CM | POA: Diagnosis not present

## 2017-11-14 DIAGNOSIS — R0600 Dyspnea, unspecified: Secondary | ICD-10-CM | POA: Diagnosis not present

## 2017-11-14 DIAGNOSIS — J9621 Acute and chronic respiratory failure with hypoxia: Secondary | ICD-10-CM | POA: Diagnosis not present

## 2017-11-15 DIAGNOSIS — N186 End stage renal disease: Secondary | ICD-10-CM | POA: Diagnosis not present

## 2017-11-15 DIAGNOSIS — D631 Anemia in chronic kidney disease: Secondary | ICD-10-CM | POA: Diagnosis not present

## 2017-11-15 DIAGNOSIS — Z4931 Encounter for adequacy testing for hemodialysis: Secondary | ICD-10-CM | POA: Diagnosis not present

## 2017-11-15 DIAGNOSIS — D509 Iron deficiency anemia, unspecified: Secondary | ICD-10-CM | POA: Diagnosis not present

## 2017-11-15 DIAGNOSIS — N2581 Secondary hyperparathyroidism of renal origin: Secondary | ICD-10-CM | POA: Diagnosis not present

## 2017-11-15 DIAGNOSIS — D689 Coagulation defect, unspecified: Secondary | ICD-10-CM | POA: Diagnosis not present

## 2017-11-16 ENCOUNTER — Encounter: Payer: Self-pay | Admitting: Family Medicine

## 2017-11-17 DIAGNOSIS — I5033 Acute on chronic diastolic (congestive) heart failure: Secondary | ICD-10-CM | POA: Diagnosis not present

## 2017-11-18 DIAGNOSIS — N186 End stage renal disease: Secondary | ICD-10-CM | POA: Diagnosis not present

## 2017-11-18 DIAGNOSIS — D631 Anemia in chronic kidney disease: Secondary | ICD-10-CM | POA: Diagnosis not present

## 2017-11-18 DIAGNOSIS — Z4931 Encounter for adequacy testing for hemodialysis: Secondary | ICD-10-CM | POA: Diagnosis not present

## 2017-11-18 DIAGNOSIS — G610 Guillain-Barre syndrome: Secondary | ICD-10-CM | POA: Diagnosis not present

## 2017-11-18 DIAGNOSIS — D689 Coagulation defect, unspecified: Secondary | ICD-10-CM | POA: Diagnosis not present

## 2017-11-18 DIAGNOSIS — D509 Iron deficiency anemia, unspecified: Secondary | ICD-10-CM | POA: Diagnosis not present

## 2017-11-18 DIAGNOSIS — N2581 Secondary hyperparathyroidism of renal origin: Secondary | ICD-10-CM | POA: Diagnosis not present

## 2017-11-18 DIAGNOSIS — J9610 Chronic respiratory failure, unspecified whether with hypoxia or hypercapnia: Secondary | ICD-10-CM | POA: Diagnosis not present

## 2017-11-20 DIAGNOSIS — D509 Iron deficiency anemia, unspecified: Secondary | ICD-10-CM | POA: Diagnosis not present

## 2017-11-20 DIAGNOSIS — D689 Coagulation defect, unspecified: Secondary | ICD-10-CM | POA: Diagnosis not present

## 2017-11-20 DIAGNOSIS — N186 End stage renal disease: Secondary | ICD-10-CM | POA: Diagnosis not present

## 2017-11-20 DIAGNOSIS — D631 Anemia in chronic kidney disease: Secondary | ICD-10-CM | POA: Diagnosis not present

## 2017-11-20 DIAGNOSIS — N2581 Secondary hyperparathyroidism of renal origin: Secondary | ICD-10-CM | POA: Diagnosis not present

## 2017-11-20 DIAGNOSIS — Z4931 Encounter for adequacy testing for hemodialysis: Secondary | ICD-10-CM | POA: Diagnosis not present

## 2017-11-22 DIAGNOSIS — D509 Iron deficiency anemia, unspecified: Secondary | ICD-10-CM | POA: Diagnosis not present

## 2017-11-22 DIAGNOSIS — N186 End stage renal disease: Secondary | ICD-10-CM | POA: Diagnosis not present

## 2017-11-22 DIAGNOSIS — D631 Anemia in chronic kidney disease: Secondary | ICD-10-CM | POA: Diagnosis not present

## 2017-11-22 DIAGNOSIS — Z4931 Encounter for adequacy testing for hemodialysis: Secondary | ICD-10-CM | POA: Diagnosis not present

## 2017-11-22 DIAGNOSIS — D689 Coagulation defect, unspecified: Secondary | ICD-10-CM | POA: Diagnosis not present

## 2017-11-22 DIAGNOSIS — N2581 Secondary hyperparathyroidism of renal origin: Secondary | ICD-10-CM | POA: Diagnosis not present

## 2017-11-25 DIAGNOSIS — N179 Acute kidney failure, unspecified: Secondary | ICD-10-CM | POA: Diagnosis not present

## 2017-11-25 DIAGNOSIS — N2581 Secondary hyperparathyroidism of renal origin: Secondary | ICD-10-CM | POA: Diagnosis not present

## 2017-11-25 DIAGNOSIS — D509 Iron deficiency anemia, unspecified: Secondary | ICD-10-CM | POA: Diagnosis not present

## 2017-11-25 DIAGNOSIS — N186 End stage renal disease: Secondary | ICD-10-CM | POA: Diagnosis not present

## 2017-11-25 DIAGNOSIS — D689 Coagulation defect, unspecified: Secondary | ICD-10-CM | POA: Diagnosis not present

## 2017-11-25 DIAGNOSIS — Z09 Encounter for follow-up examination after completed treatment for conditions other than malignant neoplasm: Secondary | ICD-10-CM | POA: Diagnosis not present

## 2017-11-25 DIAGNOSIS — D631 Anemia in chronic kidney disease: Secondary | ICD-10-CM | POA: Diagnosis not present

## 2017-11-25 DIAGNOSIS — Z4931 Encounter for adequacy testing for hemodialysis: Secondary | ICD-10-CM | POA: Diagnosis not present

## 2017-11-26 DIAGNOSIS — H6591 Unspecified nonsuppurative otitis media, right ear: Secondary | ICD-10-CM | POA: Diagnosis not present

## 2017-11-27 DIAGNOSIS — D509 Iron deficiency anemia, unspecified: Secondary | ICD-10-CM | POA: Diagnosis not present

## 2017-11-27 DIAGNOSIS — Z4931 Encounter for adequacy testing for hemodialysis: Secondary | ICD-10-CM | POA: Diagnosis not present

## 2017-11-27 DIAGNOSIS — D689 Coagulation defect, unspecified: Secondary | ICD-10-CM | POA: Diagnosis not present

## 2017-11-27 DIAGNOSIS — D631 Anemia in chronic kidney disease: Secondary | ICD-10-CM | POA: Diagnosis not present

## 2017-11-27 DIAGNOSIS — N186 End stage renal disease: Secondary | ICD-10-CM | POA: Diagnosis not present

## 2017-11-27 DIAGNOSIS — N2581 Secondary hyperparathyroidism of renal origin: Secondary | ICD-10-CM | POA: Diagnosis not present

## 2017-11-29 DIAGNOSIS — D631 Anemia in chronic kidney disease: Secondary | ICD-10-CM | POA: Diagnosis not present

## 2017-11-29 DIAGNOSIS — N2581 Secondary hyperparathyroidism of renal origin: Secondary | ICD-10-CM | POA: Diagnosis not present

## 2017-11-29 DIAGNOSIS — Z4931 Encounter for adequacy testing for hemodialysis: Secondary | ICD-10-CM | POA: Diagnosis not present

## 2017-11-29 DIAGNOSIS — D689 Coagulation defect, unspecified: Secondary | ICD-10-CM | POA: Diagnosis not present

## 2017-11-29 DIAGNOSIS — N186 End stage renal disease: Secondary | ICD-10-CM | POA: Diagnosis not present

## 2017-11-29 DIAGNOSIS — D509 Iron deficiency anemia, unspecified: Secondary | ICD-10-CM | POA: Diagnosis not present

## 2017-12-02 DIAGNOSIS — N186 End stage renal disease: Secondary | ICD-10-CM | POA: Diagnosis not present

## 2017-12-02 DIAGNOSIS — D509 Iron deficiency anemia, unspecified: Secondary | ICD-10-CM | POA: Diagnosis not present

## 2017-12-02 DIAGNOSIS — N2581 Secondary hyperparathyroidism of renal origin: Secondary | ICD-10-CM | POA: Diagnosis not present

## 2017-12-02 DIAGNOSIS — D631 Anemia in chronic kidney disease: Secondary | ICD-10-CM | POA: Diagnosis not present

## 2017-12-02 DIAGNOSIS — D689 Coagulation defect, unspecified: Secondary | ICD-10-CM | POA: Diagnosis not present

## 2017-12-02 DIAGNOSIS — Z4931 Encounter for adequacy testing for hemodialysis: Secondary | ICD-10-CM | POA: Diagnosis not present

## 2017-12-04 DIAGNOSIS — Z4931 Encounter for adequacy testing for hemodialysis: Secondary | ICD-10-CM | POA: Diagnosis not present

## 2017-12-04 DIAGNOSIS — N186 End stage renal disease: Secondary | ICD-10-CM | POA: Diagnosis not present

## 2017-12-04 DIAGNOSIS — N2581 Secondary hyperparathyroidism of renal origin: Secondary | ICD-10-CM | POA: Diagnosis not present

## 2017-12-04 DIAGNOSIS — D689 Coagulation defect, unspecified: Secondary | ICD-10-CM | POA: Diagnosis not present

## 2017-12-04 DIAGNOSIS — D631 Anemia in chronic kidney disease: Secondary | ICD-10-CM | POA: Diagnosis not present

## 2017-12-04 DIAGNOSIS — D509 Iron deficiency anemia, unspecified: Secondary | ICD-10-CM | POA: Diagnosis not present

## 2017-12-06 DIAGNOSIS — N186 End stage renal disease: Secondary | ICD-10-CM | POA: Diagnosis not present

## 2017-12-06 DIAGNOSIS — D509 Iron deficiency anemia, unspecified: Secondary | ICD-10-CM | POA: Diagnosis not present

## 2017-12-06 DIAGNOSIS — D631 Anemia in chronic kidney disease: Secondary | ICD-10-CM | POA: Diagnosis not present

## 2017-12-06 DIAGNOSIS — D689 Coagulation defect, unspecified: Secondary | ICD-10-CM | POA: Diagnosis not present

## 2017-12-06 DIAGNOSIS — N2581 Secondary hyperparathyroidism of renal origin: Secondary | ICD-10-CM | POA: Diagnosis not present

## 2017-12-06 DIAGNOSIS — Z4931 Encounter for adequacy testing for hemodialysis: Secondary | ICD-10-CM | POA: Diagnosis not present

## 2017-12-09 DIAGNOSIS — G4736 Sleep related hypoventilation in conditions classified elsewhere: Secondary | ICD-10-CM | POA: Diagnosis not present

## 2017-12-09 DIAGNOSIS — L989 Disorder of the skin and subcutaneous tissue, unspecified: Secondary | ICD-10-CM | POA: Diagnosis not present

## 2017-12-09 DIAGNOSIS — J9612 Chronic respiratory failure with hypercapnia: Secondary | ICD-10-CM | POA: Diagnosis not present

## 2017-12-09 DIAGNOSIS — H6591 Unspecified nonsuppurative otitis media, right ear: Secondary | ICD-10-CM | POA: Diagnosis not present

## 2017-12-10 DIAGNOSIS — N2581 Secondary hyperparathyroidism of renal origin: Secondary | ICD-10-CM | POA: Diagnosis not present

## 2017-12-10 DIAGNOSIS — D631 Anemia in chronic kidney disease: Secondary | ICD-10-CM | POA: Diagnosis not present

## 2017-12-10 DIAGNOSIS — N186 End stage renal disease: Secondary | ICD-10-CM | POA: Diagnosis not present

## 2017-12-10 DIAGNOSIS — D509 Iron deficiency anemia, unspecified: Secondary | ICD-10-CM | POA: Diagnosis not present

## 2017-12-10 DIAGNOSIS — Z4931 Encounter for adequacy testing for hemodialysis: Secondary | ICD-10-CM | POA: Diagnosis not present

## 2017-12-10 DIAGNOSIS — D689 Coagulation defect, unspecified: Secondary | ICD-10-CM | POA: Diagnosis not present

## 2017-12-11 DIAGNOSIS — N2581 Secondary hyperparathyroidism of renal origin: Secondary | ICD-10-CM | POA: Diagnosis not present

## 2017-12-11 DIAGNOSIS — D631 Anemia in chronic kidney disease: Secondary | ICD-10-CM | POA: Diagnosis not present

## 2017-12-11 DIAGNOSIS — Z4931 Encounter for adequacy testing for hemodialysis: Secondary | ICD-10-CM | POA: Diagnosis not present

## 2017-12-11 DIAGNOSIS — D689 Coagulation defect, unspecified: Secondary | ICD-10-CM | POA: Diagnosis not present

## 2017-12-11 DIAGNOSIS — D509 Iron deficiency anemia, unspecified: Secondary | ICD-10-CM | POA: Diagnosis not present

## 2017-12-11 DIAGNOSIS — N186 End stage renal disease: Secondary | ICD-10-CM | POA: Diagnosis not present

## 2017-12-12 DIAGNOSIS — Z992 Dependence on renal dialysis: Secondary | ICD-10-CM | POA: Diagnosis not present

## 2017-12-12 DIAGNOSIS — N186 End stage renal disease: Secondary | ICD-10-CM | POA: Diagnosis not present

## 2017-12-12 DIAGNOSIS — E1122 Type 2 diabetes mellitus with diabetic chronic kidney disease: Secondary | ICD-10-CM | POA: Diagnosis not present

## 2017-12-13 DIAGNOSIS — N186 End stage renal disease: Secondary | ICD-10-CM | POA: Diagnosis not present

## 2017-12-13 DIAGNOSIS — Z4931 Encounter for adequacy testing for hemodialysis: Secondary | ICD-10-CM | POA: Diagnosis not present

## 2017-12-13 DIAGNOSIS — D631 Anemia in chronic kidney disease: Secondary | ICD-10-CM | POA: Diagnosis not present

## 2017-12-13 DIAGNOSIS — N2581 Secondary hyperparathyroidism of renal origin: Secondary | ICD-10-CM | POA: Diagnosis not present

## 2017-12-13 DIAGNOSIS — D509 Iron deficiency anemia, unspecified: Secondary | ICD-10-CM | POA: Diagnosis not present

## 2017-12-13 DIAGNOSIS — D689 Coagulation defect, unspecified: Secondary | ICD-10-CM | POA: Diagnosis not present

## 2017-12-16 DIAGNOSIS — N186 End stage renal disease: Secondary | ICD-10-CM | POA: Diagnosis not present

## 2017-12-16 DIAGNOSIS — D689 Coagulation defect, unspecified: Secondary | ICD-10-CM | POA: Diagnosis not present

## 2017-12-16 DIAGNOSIS — D509 Iron deficiency anemia, unspecified: Secondary | ICD-10-CM | POA: Diagnosis not present

## 2017-12-16 DIAGNOSIS — D631 Anemia in chronic kidney disease: Secondary | ICD-10-CM | POA: Diagnosis not present

## 2017-12-16 DIAGNOSIS — N2581 Secondary hyperparathyroidism of renal origin: Secondary | ICD-10-CM | POA: Diagnosis not present

## 2017-12-16 DIAGNOSIS — Z4931 Encounter for adequacy testing for hemodialysis: Secondary | ICD-10-CM | POA: Diagnosis not present

## 2017-12-17 DIAGNOSIS — I4891 Unspecified atrial fibrillation: Secondary | ICD-10-CM | POA: Diagnosis not present

## 2017-12-18 DIAGNOSIS — I5033 Acute on chronic diastolic (congestive) heart failure: Secondary | ICD-10-CM | POA: Diagnosis not present

## 2017-12-19 DIAGNOSIS — J9610 Chronic respiratory failure, unspecified whether with hypoxia or hypercapnia: Secondary | ICD-10-CM | POA: Diagnosis not present

## 2017-12-19 DIAGNOSIS — G610 Guillain-Barre syndrome: Secondary | ICD-10-CM | POA: Diagnosis not present

## 2017-12-19 DIAGNOSIS — D509 Iron deficiency anemia, unspecified: Secondary | ICD-10-CM | POA: Diagnosis not present

## 2017-12-19 DIAGNOSIS — D631 Anemia in chronic kidney disease: Secondary | ICD-10-CM | POA: Diagnosis not present

## 2017-12-19 DIAGNOSIS — N186 End stage renal disease: Secondary | ICD-10-CM | POA: Diagnosis not present

## 2017-12-19 DIAGNOSIS — D689 Coagulation defect, unspecified: Secondary | ICD-10-CM | POA: Diagnosis not present

## 2017-12-19 DIAGNOSIS — N2581 Secondary hyperparathyroidism of renal origin: Secondary | ICD-10-CM | POA: Diagnosis not present

## 2017-12-19 DIAGNOSIS — Z4931 Encounter for adequacy testing for hemodialysis: Secondary | ICD-10-CM | POA: Diagnosis not present

## 2017-12-20 DIAGNOSIS — Z4931 Encounter for adequacy testing for hemodialysis: Secondary | ICD-10-CM | POA: Diagnosis not present

## 2017-12-20 DIAGNOSIS — N186 End stage renal disease: Secondary | ICD-10-CM | POA: Diagnosis not present

## 2017-12-20 DIAGNOSIS — D689 Coagulation defect, unspecified: Secondary | ICD-10-CM | POA: Diagnosis not present

## 2017-12-20 DIAGNOSIS — N2581 Secondary hyperparathyroidism of renal origin: Secondary | ICD-10-CM | POA: Diagnosis not present

## 2017-12-20 DIAGNOSIS — D509 Iron deficiency anemia, unspecified: Secondary | ICD-10-CM | POA: Diagnosis not present

## 2017-12-20 DIAGNOSIS — D631 Anemia in chronic kidney disease: Secondary | ICD-10-CM | POA: Diagnosis not present

## 2017-12-23 ENCOUNTER — Telehealth: Payer: Self-pay | Admitting: Family Medicine

## 2017-12-23 DIAGNOSIS — N186 End stage renal disease: Secondary | ICD-10-CM | POA: Diagnosis not present

## 2017-12-23 DIAGNOSIS — Z4931 Encounter for adequacy testing for hemodialysis: Secondary | ICD-10-CM | POA: Diagnosis not present

## 2017-12-23 DIAGNOSIS — D689 Coagulation defect, unspecified: Secondary | ICD-10-CM | POA: Diagnosis not present

## 2017-12-23 DIAGNOSIS — D631 Anemia in chronic kidney disease: Secondary | ICD-10-CM | POA: Diagnosis not present

## 2017-12-23 DIAGNOSIS — N2581 Secondary hyperparathyroidism of renal origin: Secondary | ICD-10-CM | POA: Diagnosis not present

## 2017-12-23 DIAGNOSIS — D509 Iron deficiency anemia, unspecified: Secondary | ICD-10-CM | POA: Diagnosis not present

## 2017-12-23 NOTE — Telephone Encounter (Signed)
Copied from [place] [phone]. Topic: Quick Communication - See Telephone Encounter >> Dec 23, 2017 10:56 AM Blase Mess A wrote: CRM for notification. See Telephone encounter for: 12/23/17.  Toni Amend is calling from [facility] [phone] requesting Clinical notes March follow up notes. Please advise [phone]

## 2017-12-23 NOTE — Telephone Encounter (Deleted)
Copied from [place] [phone]. Topic: Medical Record Request - Other >> Dec 23, 2017  6:00 PM Blase Mess A wrote: CRM for notification. See Telephone encounter for: 12/23/17.

## 2017-12-23 NOTE — Telephone Encounter (Signed)
Sent message to [facility] to send to medical records

## 2017-12-25 DIAGNOSIS — N2581 Secondary hyperparathyroidism of renal origin: Secondary | ICD-10-CM | POA: Diagnosis not present

## 2017-12-25 DIAGNOSIS — Z4931 Encounter for adequacy testing for hemodialysis: Secondary | ICD-10-CM | POA: Diagnosis not present

## 2017-12-25 DIAGNOSIS — D631 Anemia in chronic kidney disease: Secondary | ICD-10-CM | POA: Diagnosis not present

## 2017-12-25 DIAGNOSIS — D689 Coagulation defect, unspecified: Secondary | ICD-10-CM | POA: Diagnosis not present

## 2017-12-25 DIAGNOSIS — D509 Iron deficiency anemia, unspecified: Secondary | ICD-10-CM | POA: Diagnosis not present

## 2017-12-25 DIAGNOSIS — N186 End stage renal disease: Secondary | ICD-10-CM | POA: Diagnosis not present

## 2017-12-25 DIAGNOSIS — E1122 Type 2 diabetes mellitus with diabetic chronic kidney disease: Secondary | ICD-10-CM | POA: Diagnosis not present

## 2017-12-27 DIAGNOSIS — N186 End stage renal disease: Secondary | ICD-10-CM | POA: Diagnosis not present

## 2017-12-27 DIAGNOSIS — D689 Coagulation defect, unspecified: Secondary | ICD-10-CM | POA: Diagnosis not present

## 2017-12-27 DIAGNOSIS — D509 Iron deficiency anemia, unspecified: Secondary | ICD-10-CM | POA: Diagnosis not present

## 2017-12-27 DIAGNOSIS — Z4931 Encounter for adequacy testing for hemodialysis: Secondary | ICD-10-CM | POA: Diagnosis not present

## 2017-12-27 DIAGNOSIS — N2581 Secondary hyperparathyroidism of renal origin: Secondary | ICD-10-CM | POA: Diagnosis not present

## 2017-12-27 DIAGNOSIS — D631 Anemia in chronic kidney disease: Secondary | ICD-10-CM | POA: Diagnosis not present

## 2017-12-30 DIAGNOSIS — Z4931 Encounter for adequacy testing for hemodialysis: Secondary | ICD-10-CM | POA: Diagnosis not present

## 2017-12-30 DIAGNOSIS — D689 Coagulation defect, unspecified: Secondary | ICD-10-CM | POA: Diagnosis not present

## 2017-12-30 DIAGNOSIS — N2581 Secondary hyperparathyroidism of renal origin: Secondary | ICD-10-CM | POA: Diagnosis not present

## 2017-12-30 DIAGNOSIS — D631 Anemia in chronic kidney disease: Secondary | ICD-10-CM | POA: Diagnosis not present

## 2017-12-30 DIAGNOSIS — N186 End stage renal disease: Secondary | ICD-10-CM | POA: Diagnosis not present

## 2017-12-30 DIAGNOSIS — D509 Iron deficiency anemia, unspecified: Secondary | ICD-10-CM | POA: Diagnosis not present

## 2018-01-01 DIAGNOSIS — Z4931 Encounter for adequacy testing for hemodialysis: Secondary | ICD-10-CM | POA: Diagnosis not present

## 2018-01-01 DIAGNOSIS — D631 Anemia in chronic kidney disease: Secondary | ICD-10-CM | POA: Diagnosis not present

## 2018-01-01 DIAGNOSIS — D689 Coagulation defect, unspecified: Secondary | ICD-10-CM | POA: Diagnosis not present

## 2018-01-01 DIAGNOSIS — D509 Iron deficiency anemia, unspecified: Secondary | ICD-10-CM | POA: Diagnosis not present

## 2018-01-01 DIAGNOSIS — N186 End stage renal disease: Secondary | ICD-10-CM | POA: Diagnosis not present

## 2018-01-01 DIAGNOSIS — N2581 Secondary hyperparathyroidism of renal origin: Secondary | ICD-10-CM | POA: Diagnosis not present

## 2018-01-02 DIAGNOSIS — D229 Melanocytic nevi, unspecified: Secondary | ICD-10-CM | POA: Diagnosis not present

## 2018-01-02 DIAGNOSIS — D170 Benign lipomatous neoplasm of skin and subcutaneous tissue of head, face and neck: Secondary | ICD-10-CM | POA: Diagnosis not present

## 2018-01-02 DIAGNOSIS — L570 Actinic keratosis: Secondary | ICD-10-CM | POA: Diagnosis not present

## 2018-01-02 DIAGNOSIS — L821 Other seborrheic keratosis: Secondary | ICD-10-CM | POA: Diagnosis not present

## 2018-01-03 DIAGNOSIS — D509 Iron deficiency anemia, unspecified: Secondary | ICD-10-CM | POA: Diagnosis not present

## 2018-01-03 DIAGNOSIS — N2581 Secondary hyperparathyroidism of renal origin: Secondary | ICD-10-CM | POA: Diagnosis not present

## 2018-01-03 DIAGNOSIS — N186 End stage renal disease: Secondary | ICD-10-CM | POA: Diagnosis not present

## 2018-01-03 DIAGNOSIS — D689 Coagulation defect, unspecified: Secondary | ICD-10-CM | POA: Diagnosis not present

## 2018-01-03 DIAGNOSIS — D631 Anemia in chronic kidney disease: Secondary | ICD-10-CM | POA: Diagnosis not present

## 2018-01-03 DIAGNOSIS — Z4931 Encounter for adequacy testing for hemodialysis: Secondary | ICD-10-CM | POA: Diagnosis not present

## 2018-01-05 DIAGNOSIS — Z4931 Encounter for adequacy testing for hemodialysis: Secondary | ICD-10-CM | POA: Diagnosis not present

## 2018-01-05 DIAGNOSIS — N2581 Secondary hyperparathyroidism of renal origin: Secondary | ICD-10-CM | POA: Diagnosis not present

## 2018-01-05 DIAGNOSIS — D689 Coagulation defect, unspecified: Secondary | ICD-10-CM | POA: Diagnosis not present

## 2018-01-05 DIAGNOSIS — D631 Anemia in chronic kidney disease: Secondary | ICD-10-CM | POA: Diagnosis not present

## 2018-01-05 DIAGNOSIS — N186 End stage renal disease: Secondary | ICD-10-CM | POA: Diagnosis not present

## 2018-01-05 DIAGNOSIS — D509 Iron deficiency anemia, unspecified: Secondary | ICD-10-CM | POA: Diagnosis not present

## 2018-01-06 DIAGNOSIS — K5900 Constipation, unspecified: Secondary | ICD-10-CM | POA: Diagnosis not present

## 2018-01-06 DIAGNOSIS — Z881 Allergy status to other antibiotic agents status: Secondary | ICD-10-CM | POA: Diagnosis not present

## 2018-01-06 DIAGNOSIS — N186 End stage renal disease: Secondary | ICD-10-CM | POA: Diagnosis not present

## 2018-01-06 DIAGNOSIS — E1122 Type 2 diabetes mellitus with diabetic chronic kidney disease: Secondary | ICD-10-CM | POA: Diagnosis not present

## 2018-01-06 DIAGNOSIS — K5901 Slow transit constipation: Secondary | ICD-10-CM | POA: Diagnosis not present

## 2018-01-06 DIAGNOSIS — R140 Abdominal distension (gaseous): Secondary | ICD-10-CM | POA: Diagnosis not present

## 2018-01-06 DIAGNOSIS — R110 Nausea: Secondary | ICD-10-CM | POA: Diagnosis not present

## 2018-01-06 DIAGNOSIS — Z885 Allergy status to narcotic agent status: Secondary | ICD-10-CM | POA: Diagnosis not present

## 2018-01-06 DIAGNOSIS — Z880 Allergy status to penicillin: Secondary | ICD-10-CM | POA: Diagnosis not present

## 2018-01-06 DIAGNOSIS — Z905 Acquired absence of kidney: Secondary | ICD-10-CM | POA: Diagnosis not present

## 2018-01-06 DIAGNOSIS — I120 Hypertensive chronic kidney disease with stage 5 chronic kidney disease or end stage renal disease: Secondary | ICD-10-CM | POA: Diagnosis not present

## 2018-01-06 DIAGNOSIS — Z87891 Personal history of nicotine dependence: Secondary | ICD-10-CM | POA: Diagnosis not present

## 2018-01-06 DIAGNOSIS — Z992 Dependence on renal dialysis: Secondary | ICD-10-CM | POA: Diagnosis not present

## 2018-01-06 DIAGNOSIS — I4891 Unspecified atrial fibrillation: Secondary | ICD-10-CM | POA: Diagnosis not present

## 2018-01-06 DIAGNOSIS — R109 Unspecified abdominal pain: Secondary | ICD-10-CM | POA: Diagnosis not present

## 2018-01-06 DIAGNOSIS — Z8551 Personal history of malignant neoplasm of bladder: Secondary | ICD-10-CM | POA: Diagnosis not present

## 2018-01-06 DIAGNOSIS — Z888 Allergy status to other drugs, medicaments and biological substances status: Secondary | ICD-10-CM | POA: Diagnosis not present

## 2018-01-06 DIAGNOSIS — Z9049 Acquired absence of other specified parts of digestive tract: Secondary | ICD-10-CM | POA: Diagnosis not present

## 2018-01-06 DIAGNOSIS — R112 Nausea with vomiting, unspecified: Secondary | ICD-10-CM | POA: Diagnosis not present

## 2018-01-07 DIAGNOSIS — D509 Iron deficiency anemia, unspecified: Secondary | ICD-10-CM | POA: Diagnosis not present

## 2018-01-07 DIAGNOSIS — D689 Coagulation defect, unspecified: Secondary | ICD-10-CM | POA: Diagnosis not present

## 2018-01-07 DIAGNOSIS — Z4931 Encounter for adequacy testing for hemodialysis: Secondary | ICD-10-CM | POA: Diagnosis not present

## 2018-01-07 DIAGNOSIS — N2581 Secondary hyperparathyroidism of renal origin: Secondary | ICD-10-CM | POA: Diagnosis not present

## 2018-01-07 DIAGNOSIS — D631 Anemia in chronic kidney disease: Secondary | ICD-10-CM | POA: Diagnosis not present

## 2018-01-07 DIAGNOSIS — N186 End stage renal disease: Secondary | ICD-10-CM | POA: Diagnosis not present

## 2018-01-10 DIAGNOSIS — N2581 Secondary hyperparathyroidism of renal origin: Secondary | ICD-10-CM | POA: Diagnosis not present

## 2018-01-10 DIAGNOSIS — N186 End stage renal disease: Secondary | ICD-10-CM | POA: Diagnosis not present

## 2018-01-10 DIAGNOSIS — D689 Coagulation defect, unspecified: Secondary | ICD-10-CM | POA: Diagnosis not present

## 2018-01-10 DIAGNOSIS — D509 Iron deficiency anemia, unspecified: Secondary | ICD-10-CM | POA: Diagnosis not present

## 2018-01-10 DIAGNOSIS — D631 Anemia in chronic kidney disease: Secondary | ICD-10-CM | POA: Diagnosis not present

## 2018-01-10 DIAGNOSIS — Z4931 Encounter for adequacy testing for hemodialysis: Secondary | ICD-10-CM | POA: Diagnosis not present

## 2018-01-11 DIAGNOSIS — E1122 Type 2 diabetes mellitus with diabetic chronic kidney disease: Secondary | ICD-10-CM | POA: Diagnosis not present

## 2018-01-11 DIAGNOSIS — Z992 Dependence on renal dialysis: Secondary | ICD-10-CM | POA: Diagnosis not present

## 2018-01-11 DIAGNOSIS — N186 End stage renal disease: Secondary | ICD-10-CM | POA: Diagnosis not present

## 2018-01-13 DIAGNOSIS — N186 End stage renal disease: Secondary | ICD-10-CM | POA: Diagnosis not present

## 2018-01-13 DIAGNOSIS — Z4931 Encounter for adequacy testing for hemodialysis: Secondary | ICD-10-CM | POA: Diagnosis not present

## 2018-01-13 DIAGNOSIS — D689 Coagulation defect, unspecified: Secondary | ICD-10-CM | POA: Diagnosis not present

## 2018-01-13 DIAGNOSIS — N2581 Secondary hyperparathyroidism of renal origin: Secondary | ICD-10-CM | POA: Diagnosis not present

## 2018-01-13 DIAGNOSIS — D631 Anemia in chronic kidney disease: Secondary | ICD-10-CM | POA: Diagnosis not present

## 2018-01-14 DIAGNOSIS — A0471 Enterocolitis due to Clostridium difficile, recurrent: Secondary | ICD-10-CM | POA: Diagnosis not present

## 2018-01-14 DIAGNOSIS — Z794 Long term (current) use of insulin: Secondary | ICD-10-CM | POA: Diagnosis not present

## 2018-01-14 DIAGNOSIS — I10 Essential (primary) hypertension: Secondary | ICD-10-CM | POA: Diagnosis not present

## 2018-01-14 DIAGNOSIS — Z79899 Other long term (current) drug therapy: Secondary | ICD-10-CM | POA: Diagnosis not present

## 2018-01-14 DIAGNOSIS — Z905 Acquired absence of kidney: Secondary | ICD-10-CM | POA: Diagnosis not present

## 2018-01-14 DIAGNOSIS — I5033 Acute on chronic diastolic (congestive) heart failure: Secondary | ICD-10-CM | POA: Diagnosis not present

## 2018-01-14 DIAGNOSIS — E1143 Type 2 diabetes mellitus with diabetic autonomic (poly)neuropathy: Secondary | ICD-10-CM | POA: Diagnosis not present

## 2018-01-14 DIAGNOSIS — Z8553 Personal history of malignant neoplasm of renal pelvis: Secondary | ICD-10-CM | POA: Diagnosis not present

## 2018-01-14 DIAGNOSIS — K760 Fatty (change of) liver, not elsewhere classified: Secondary | ICD-10-CM | POA: Diagnosis not present

## 2018-01-14 DIAGNOSIS — J9622 Acute and chronic respiratory failure with hypercapnia: Secondary | ICD-10-CM | POA: Diagnosis not present

## 2018-01-14 DIAGNOSIS — Z8551 Personal history of malignant neoplasm of bladder: Secondary | ICD-10-CM | POA: Diagnosis not present

## 2018-01-14 DIAGNOSIS — E1122 Type 2 diabetes mellitus with diabetic chronic kidney disease: Secondary | ICD-10-CM | POA: Diagnosis not present

## 2018-01-14 DIAGNOSIS — G6181 Chronic inflammatory demyelinating polyneuritis: Secondary | ICD-10-CM | POA: Diagnosis not present

## 2018-01-14 DIAGNOSIS — D3502 Benign neoplasm of left adrenal gland: Secondary | ICD-10-CM | POA: Diagnosis not present

## 2018-01-14 DIAGNOSIS — K861 Other chronic pancreatitis: Secondary | ICD-10-CM | POA: Diagnosis not present

## 2018-01-14 DIAGNOSIS — D3501 Benign neoplasm of right adrenal gland: Secondary | ICD-10-CM | POA: Diagnosis not present

## 2018-01-14 DIAGNOSIS — Z992 Dependence on renal dialysis: Secondary | ICD-10-CM | POA: Diagnosis not present

## 2018-01-14 DIAGNOSIS — I4891 Unspecified atrial fibrillation: Secondary | ICD-10-CM | POA: Diagnosis not present

## 2018-01-14 DIAGNOSIS — R0602 Shortness of breath: Secondary | ICD-10-CM | POA: Diagnosis not present

## 2018-01-14 DIAGNOSIS — G610 Guillain-Barre syndrome: Secondary | ICD-10-CM | POA: Diagnosis not present

## 2018-01-14 DIAGNOSIS — E785 Hyperlipidemia, unspecified: Secondary | ICD-10-CM | POA: Diagnosis not present

## 2018-01-14 DIAGNOSIS — K3184 Gastroparesis: Secondary | ICD-10-CM | POA: Diagnosis not present

## 2018-01-14 DIAGNOSIS — I132 Hypertensive heart and chronic kidney disease with heart failure and with stage 5 chronic kidney disease, or end stage renal disease: Secondary | ICD-10-CM | POA: Diagnosis not present

## 2018-01-14 DIAGNOSIS — N186 End stage renal disease: Secondary | ICD-10-CM | POA: Diagnosis not present

## 2018-01-15 DIAGNOSIS — N186 End stage renal disease: Secondary | ICD-10-CM | POA: Diagnosis not present

## 2018-01-15 DIAGNOSIS — D689 Coagulation defect, unspecified: Secondary | ICD-10-CM | POA: Diagnosis not present

## 2018-01-15 DIAGNOSIS — Z4931 Encounter for adequacy testing for hemodialysis: Secondary | ICD-10-CM | POA: Diagnosis not present

## 2018-01-15 DIAGNOSIS — N2581 Secondary hyperparathyroidism of renal origin: Secondary | ICD-10-CM | POA: Diagnosis not present

## 2018-01-15 DIAGNOSIS — D631 Anemia in chronic kidney disease: Secondary | ICD-10-CM | POA: Diagnosis not present

## 2018-01-17 DIAGNOSIS — N2581 Secondary hyperparathyroidism of renal origin: Secondary | ICD-10-CM | POA: Diagnosis not present

## 2018-01-17 DIAGNOSIS — D631 Anemia in chronic kidney disease: Secondary | ICD-10-CM | POA: Diagnosis not present

## 2018-01-17 DIAGNOSIS — D689 Coagulation defect, unspecified: Secondary | ICD-10-CM | POA: Diagnosis not present

## 2018-01-17 DIAGNOSIS — I5033 Acute on chronic diastolic (congestive) heart failure: Secondary | ICD-10-CM | POA: Diagnosis not present

## 2018-01-17 DIAGNOSIS — Z4931 Encounter for adequacy testing for hemodialysis: Secondary | ICD-10-CM | POA: Diagnosis not present

## 2018-01-17 DIAGNOSIS — N186 End stage renal disease: Secondary | ICD-10-CM | POA: Diagnosis not present

## 2018-01-18 DIAGNOSIS — J9610 Chronic respiratory failure, unspecified whether with hypoxia or hypercapnia: Secondary | ICD-10-CM | POA: Diagnosis not present

## 2018-01-18 DIAGNOSIS — G610 Guillain-Barre syndrome: Secondary | ICD-10-CM | POA: Diagnosis not present

## 2018-01-20 DIAGNOSIS — Z4931 Encounter for adequacy testing for hemodialysis: Secondary | ICD-10-CM | POA: Diagnosis not present

## 2018-01-20 DIAGNOSIS — D631 Anemia in chronic kidney disease: Secondary | ICD-10-CM | POA: Diagnosis not present

## 2018-01-20 DIAGNOSIS — N186 End stage renal disease: Secondary | ICD-10-CM | POA: Diagnosis not present

## 2018-01-20 DIAGNOSIS — D689 Coagulation defect, unspecified: Secondary | ICD-10-CM | POA: Diagnosis not present

## 2018-01-20 DIAGNOSIS — N2581 Secondary hyperparathyroidism of renal origin: Secondary | ICD-10-CM | POA: Diagnosis not present

## 2018-01-21 DIAGNOSIS — K449 Diaphragmatic hernia without obstruction or gangrene: Secondary | ICD-10-CM | POA: Diagnosis not present

## 2018-01-21 DIAGNOSIS — R110 Nausea: Secondary | ICD-10-CM | POA: Diagnosis not present

## 2018-01-21 DIAGNOSIS — Q394 Esophageal web: Secondary | ICD-10-CM | POA: Diagnosis not present

## 2018-01-22 DIAGNOSIS — Z4931 Encounter for adequacy testing for hemodialysis: Secondary | ICD-10-CM | POA: Diagnosis not present

## 2018-01-22 DIAGNOSIS — D689 Coagulation defect, unspecified: Secondary | ICD-10-CM | POA: Diagnosis not present

## 2018-01-22 DIAGNOSIS — N186 End stage renal disease: Secondary | ICD-10-CM | POA: Diagnosis not present

## 2018-01-22 DIAGNOSIS — D631 Anemia in chronic kidney disease: Secondary | ICD-10-CM | POA: Diagnosis not present

## 2018-01-22 DIAGNOSIS — N2581 Secondary hyperparathyroidism of renal origin: Secondary | ICD-10-CM | POA: Diagnosis not present

## 2018-01-24 DIAGNOSIS — D631 Anemia in chronic kidney disease: Secondary | ICD-10-CM | POA: Diagnosis not present

## 2018-01-24 DIAGNOSIS — N2581 Secondary hyperparathyroidism of renal origin: Secondary | ICD-10-CM | POA: Diagnosis not present

## 2018-01-24 DIAGNOSIS — N186 End stage renal disease: Secondary | ICD-10-CM | POA: Diagnosis not present

## 2018-01-24 DIAGNOSIS — D689 Coagulation defect, unspecified: Secondary | ICD-10-CM | POA: Diagnosis not present

## 2018-01-24 DIAGNOSIS — Z4931 Encounter for adequacy testing for hemodialysis: Secondary | ICD-10-CM | POA: Diagnosis not present

## 2018-01-27 DIAGNOSIS — D689 Coagulation defect, unspecified: Secondary | ICD-10-CM | POA: Diagnosis not present

## 2018-01-27 DIAGNOSIS — N2581 Secondary hyperparathyroidism of renal origin: Secondary | ICD-10-CM | POA: Diagnosis not present

## 2018-01-27 DIAGNOSIS — Z4931 Encounter for adequacy testing for hemodialysis: Secondary | ICD-10-CM | POA: Diagnosis not present

## 2018-01-27 DIAGNOSIS — N186 End stage renal disease: Secondary | ICD-10-CM | POA: Diagnosis not present

## 2018-01-27 DIAGNOSIS — I770 Arteriovenous fistula, acquired: Secondary | ICD-10-CM | POA: Diagnosis not present

## 2018-01-27 DIAGNOSIS — N179 Acute kidney failure, unspecified: Secondary | ICD-10-CM | POA: Diagnosis not present

## 2018-01-27 DIAGNOSIS — Z992 Dependence on renal dialysis: Secondary | ICD-10-CM | POA: Diagnosis not present

## 2018-01-27 DIAGNOSIS — D631 Anemia in chronic kidney disease: Secondary | ICD-10-CM | POA: Diagnosis not present

## 2018-01-27 DIAGNOSIS — Z09 Encounter for follow-up examination after completed treatment for conditions other than malignant neoplasm: Secondary | ICD-10-CM | POA: Diagnosis not present

## 2018-01-29 DIAGNOSIS — Z4931 Encounter for adequacy testing for hemodialysis: Secondary | ICD-10-CM | POA: Diagnosis not present

## 2018-01-29 DIAGNOSIS — N186 End stage renal disease: Secondary | ICD-10-CM | POA: Diagnosis not present

## 2018-01-29 DIAGNOSIS — N2581 Secondary hyperparathyroidism of renal origin: Secondary | ICD-10-CM | POA: Diagnosis not present

## 2018-01-29 DIAGNOSIS — D689 Coagulation defect, unspecified: Secondary | ICD-10-CM | POA: Diagnosis not present

## 2018-01-29 DIAGNOSIS — D631 Anemia in chronic kidney disease: Secondary | ICD-10-CM | POA: Diagnosis not present

## 2018-01-31 DIAGNOSIS — N2581 Secondary hyperparathyroidism of renal origin: Secondary | ICD-10-CM | POA: Diagnosis not present

## 2018-01-31 DIAGNOSIS — Z4931 Encounter for adequacy testing for hemodialysis: Secondary | ICD-10-CM | POA: Diagnosis not present

## 2018-01-31 DIAGNOSIS — N186 End stage renal disease: Secondary | ICD-10-CM | POA: Diagnosis not present

## 2018-01-31 DIAGNOSIS — D689 Coagulation defect, unspecified: Secondary | ICD-10-CM | POA: Diagnosis not present

## 2018-01-31 DIAGNOSIS — D631 Anemia in chronic kidney disease: Secondary | ICD-10-CM | POA: Diagnosis not present

## 2018-02-02 DIAGNOSIS — N186 End stage renal disease: Secondary | ICD-10-CM | POA: Diagnosis not present

## 2018-02-02 DIAGNOSIS — N2581 Secondary hyperparathyroidism of renal origin: Secondary | ICD-10-CM | POA: Diagnosis not present

## 2018-02-02 DIAGNOSIS — D689 Coagulation defect, unspecified: Secondary | ICD-10-CM | POA: Diagnosis not present

## 2018-02-02 DIAGNOSIS — Z4931 Encounter for adequacy testing for hemodialysis: Secondary | ICD-10-CM | POA: Diagnosis not present

## 2018-02-02 DIAGNOSIS — D631 Anemia in chronic kidney disease: Secondary | ICD-10-CM | POA: Diagnosis not present

## 2018-02-04 DIAGNOSIS — Z4931 Encounter for adequacy testing for hemodialysis: Secondary | ICD-10-CM | POA: Diagnosis not present

## 2018-02-04 DIAGNOSIS — D631 Anemia in chronic kidney disease: Secondary | ICD-10-CM | POA: Diagnosis not present

## 2018-02-04 DIAGNOSIS — D689 Coagulation defect, unspecified: Secondary | ICD-10-CM | POA: Diagnosis not present

## 2018-02-04 DIAGNOSIS — N186 End stage renal disease: Secondary | ICD-10-CM | POA: Diagnosis not present

## 2018-02-04 DIAGNOSIS — N2581 Secondary hyperparathyroidism of renal origin: Secondary | ICD-10-CM | POA: Diagnosis not present

## 2018-02-07 DIAGNOSIS — Z4931 Encounter for adequacy testing for hemodialysis: Secondary | ICD-10-CM | POA: Diagnosis not present

## 2018-02-07 DIAGNOSIS — D631 Anemia in chronic kidney disease: Secondary | ICD-10-CM | POA: Diagnosis not present

## 2018-02-07 DIAGNOSIS — N2581 Secondary hyperparathyroidism of renal origin: Secondary | ICD-10-CM | POA: Diagnosis not present

## 2018-02-07 DIAGNOSIS — D689 Coagulation defect, unspecified: Secondary | ICD-10-CM | POA: Diagnosis not present

## 2018-02-07 DIAGNOSIS — N186 End stage renal disease: Secondary | ICD-10-CM | POA: Diagnosis not present

## 2018-02-10 DIAGNOSIS — D689 Coagulation defect, unspecified: Secondary | ICD-10-CM | POA: Diagnosis not present

## 2018-02-10 DIAGNOSIS — Z4931 Encounter for adequacy testing for hemodialysis: Secondary | ICD-10-CM | POA: Diagnosis not present

## 2018-02-10 DIAGNOSIS — D631 Anemia in chronic kidney disease: Secondary | ICD-10-CM | POA: Diagnosis not present

## 2018-02-10 DIAGNOSIS — N186 End stage renal disease: Secondary | ICD-10-CM | POA: Diagnosis not present

## 2018-02-10 DIAGNOSIS — N2581 Secondary hyperparathyroidism of renal origin: Secondary | ICD-10-CM | POA: Diagnosis not present

## 2018-02-11 DIAGNOSIS — N186 End stage renal disease: Secondary | ICD-10-CM | POA: Diagnosis not present

## 2018-02-11 DIAGNOSIS — E1122 Type 2 diabetes mellitus with diabetic chronic kidney disease: Secondary | ICD-10-CM | POA: Diagnosis not present

## 2018-02-11 DIAGNOSIS — Z992 Dependence on renal dialysis: Secondary | ICD-10-CM | POA: Diagnosis not present

## 2018-02-12 DIAGNOSIS — D689 Coagulation defect, unspecified: Secondary | ICD-10-CM | POA: Diagnosis not present

## 2018-02-12 DIAGNOSIS — N2581 Secondary hyperparathyroidism of renal origin: Secondary | ICD-10-CM | POA: Diagnosis not present

## 2018-02-12 DIAGNOSIS — D631 Anemia in chronic kidney disease: Secondary | ICD-10-CM | POA: Diagnosis not present

## 2018-02-12 DIAGNOSIS — I159 Secondary hypertension, unspecified: Secondary | ICD-10-CM | POA: Diagnosis not present

## 2018-02-12 DIAGNOSIS — N186 End stage renal disease: Secondary | ICD-10-CM | POA: Diagnosis not present

## 2018-02-12 DIAGNOSIS — Z4931 Encounter for adequacy testing for hemodialysis: Secondary | ICD-10-CM | POA: Diagnosis not present

## 2018-02-14 DIAGNOSIS — D631 Anemia in chronic kidney disease: Secondary | ICD-10-CM | POA: Diagnosis not present

## 2018-02-14 DIAGNOSIS — D689 Coagulation defect, unspecified: Secondary | ICD-10-CM | POA: Diagnosis not present

## 2018-02-14 DIAGNOSIS — Z4931 Encounter for adequacy testing for hemodialysis: Secondary | ICD-10-CM | POA: Diagnosis not present

## 2018-02-14 DIAGNOSIS — I159 Secondary hypertension, unspecified: Secondary | ICD-10-CM | POA: Diagnosis not present

## 2018-02-14 DIAGNOSIS — N186 End stage renal disease: Secondary | ICD-10-CM | POA: Diagnosis not present

## 2018-02-14 DIAGNOSIS — N2581 Secondary hyperparathyroidism of renal origin: Secondary | ICD-10-CM | POA: Diagnosis not present

## 2018-02-17 DIAGNOSIS — N186 End stage renal disease: Secondary | ICD-10-CM | POA: Diagnosis not present

## 2018-02-17 DIAGNOSIS — I5033 Acute on chronic diastolic (congestive) heart failure: Secondary | ICD-10-CM | POA: Diagnosis not present

## 2018-02-17 DIAGNOSIS — I159 Secondary hypertension, unspecified: Secondary | ICD-10-CM | POA: Diagnosis not present

## 2018-02-17 DIAGNOSIS — Z4931 Encounter for adequacy testing for hemodialysis: Secondary | ICD-10-CM | POA: Diagnosis not present

## 2018-02-17 DIAGNOSIS — D689 Coagulation defect, unspecified: Secondary | ICD-10-CM | POA: Diagnosis not present

## 2018-02-17 DIAGNOSIS — N2581 Secondary hyperparathyroidism of renal origin: Secondary | ICD-10-CM | POA: Diagnosis not present

## 2018-02-17 DIAGNOSIS — D631 Anemia in chronic kidney disease: Secondary | ICD-10-CM | POA: Diagnosis not present

## 2018-02-18 DIAGNOSIS — J9610 Chronic respiratory failure, unspecified whether with hypoxia or hypercapnia: Secondary | ICD-10-CM | POA: Diagnosis not present

## 2018-02-18 DIAGNOSIS — G610 Guillain-Barre syndrome: Secondary | ICD-10-CM | POA: Diagnosis not present

## 2018-02-19 DIAGNOSIS — N2581 Secondary hyperparathyroidism of renal origin: Secondary | ICD-10-CM | POA: Diagnosis not present

## 2018-02-19 DIAGNOSIS — N186 End stage renal disease: Secondary | ICD-10-CM | POA: Diagnosis not present

## 2018-02-19 DIAGNOSIS — D689 Coagulation defect, unspecified: Secondary | ICD-10-CM | POA: Diagnosis not present

## 2018-02-19 DIAGNOSIS — D631 Anemia in chronic kidney disease: Secondary | ICD-10-CM | POA: Diagnosis not present

## 2018-02-19 DIAGNOSIS — Z4931 Encounter for adequacy testing for hemodialysis: Secondary | ICD-10-CM | POA: Diagnosis not present

## 2018-02-19 DIAGNOSIS — I159 Secondary hypertension, unspecified: Secondary | ICD-10-CM | POA: Diagnosis not present

## 2018-02-21 DIAGNOSIS — D631 Anemia in chronic kidney disease: Secondary | ICD-10-CM | POA: Diagnosis not present

## 2018-02-21 DIAGNOSIS — D689 Coagulation defect, unspecified: Secondary | ICD-10-CM | POA: Diagnosis not present

## 2018-02-21 DIAGNOSIS — I159 Secondary hypertension, unspecified: Secondary | ICD-10-CM | POA: Diagnosis not present

## 2018-02-21 DIAGNOSIS — N186 End stage renal disease: Secondary | ICD-10-CM | POA: Diagnosis not present

## 2018-02-21 DIAGNOSIS — Z4931 Encounter for adequacy testing for hemodialysis: Secondary | ICD-10-CM | POA: Diagnosis not present

## 2018-02-21 DIAGNOSIS — N2581 Secondary hyperparathyroidism of renal origin: Secondary | ICD-10-CM | POA: Diagnosis not present

## 2018-02-24 DIAGNOSIS — N2581 Secondary hyperparathyroidism of renal origin: Secondary | ICD-10-CM | POA: Diagnosis not present

## 2018-02-24 DIAGNOSIS — D631 Anemia in chronic kidney disease: Secondary | ICD-10-CM | POA: Diagnosis not present

## 2018-02-24 DIAGNOSIS — N186 End stage renal disease: Secondary | ICD-10-CM | POA: Diagnosis not present

## 2018-02-24 DIAGNOSIS — D689 Coagulation defect, unspecified: Secondary | ICD-10-CM | POA: Diagnosis not present

## 2018-02-24 DIAGNOSIS — I159 Secondary hypertension, unspecified: Secondary | ICD-10-CM | POA: Diagnosis not present

## 2018-02-24 DIAGNOSIS — Z4931 Encounter for adequacy testing for hemodialysis: Secondary | ICD-10-CM | POA: Diagnosis not present

## 2018-02-26 DIAGNOSIS — D631 Anemia in chronic kidney disease: Secondary | ICD-10-CM | POA: Diagnosis not present

## 2018-02-26 DIAGNOSIS — D689 Coagulation defect, unspecified: Secondary | ICD-10-CM | POA: Diagnosis not present

## 2018-02-26 DIAGNOSIS — I159 Secondary hypertension, unspecified: Secondary | ICD-10-CM | POA: Diagnosis not present

## 2018-02-26 DIAGNOSIS — N186 End stage renal disease: Secondary | ICD-10-CM | POA: Diagnosis not present

## 2018-02-26 DIAGNOSIS — Z4931 Encounter for adequacy testing for hemodialysis: Secondary | ICD-10-CM | POA: Diagnosis not present

## 2018-02-26 DIAGNOSIS — N2581 Secondary hyperparathyroidism of renal origin: Secondary | ICD-10-CM | POA: Diagnosis not present

## 2018-02-27 DIAGNOSIS — R110 Nausea: Secondary | ICD-10-CM | POA: Diagnosis not present

## 2018-02-28 DIAGNOSIS — D631 Anemia in chronic kidney disease: Secondary | ICD-10-CM | POA: Diagnosis not present

## 2018-02-28 DIAGNOSIS — N186 End stage renal disease: Secondary | ICD-10-CM | POA: Diagnosis not present

## 2018-02-28 DIAGNOSIS — Z4931 Encounter for adequacy testing for hemodialysis: Secondary | ICD-10-CM | POA: Diagnosis not present

## 2018-02-28 DIAGNOSIS — N2581 Secondary hyperparathyroidism of renal origin: Secondary | ICD-10-CM | POA: Diagnosis not present

## 2018-02-28 DIAGNOSIS — D689 Coagulation defect, unspecified: Secondary | ICD-10-CM | POA: Diagnosis not present

## 2018-02-28 DIAGNOSIS — I159 Secondary hypertension, unspecified: Secondary | ICD-10-CM | POA: Diagnosis not present

## 2018-03-03 DIAGNOSIS — Z4931 Encounter for adequacy testing for hemodialysis: Secondary | ICD-10-CM | POA: Diagnosis not present

## 2018-03-03 DIAGNOSIS — N186 End stage renal disease: Secondary | ICD-10-CM | POA: Diagnosis not present

## 2018-03-03 DIAGNOSIS — N2581 Secondary hyperparathyroidism of renal origin: Secondary | ICD-10-CM | POA: Diagnosis not present

## 2018-03-03 DIAGNOSIS — D631 Anemia in chronic kidney disease: Secondary | ICD-10-CM | POA: Diagnosis not present

## 2018-03-03 DIAGNOSIS — D689 Coagulation defect, unspecified: Secondary | ICD-10-CM | POA: Diagnosis not present

## 2018-03-03 DIAGNOSIS — I159 Secondary hypertension, unspecified: Secondary | ICD-10-CM | POA: Diagnosis not present

## 2018-03-05 DIAGNOSIS — N2581 Secondary hyperparathyroidism of renal origin: Secondary | ICD-10-CM | POA: Diagnosis not present

## 2018-03-05 DIAGNOSIS — N186 End stage renal disease: Secondary | ICD-10-CM | POA: Diagnosis not present

## 2018-03-05 DIAGNOSIS — D689 Coagulation defect, unspecified: Secondary | ICD-10-CM | POA: Diagnosis not present

## 2018-03-05 DIAGNOSIS — I159 Secondary hypertension, unspecified: Secondary | ICD-10-CM | POA: Diagnosis not present

## 2018-03-05 DIAGNOSIS — Z4931 Encounter for adequacy testing for hemodialysis: Secondary | ICD-10-CM | POA: Diagnosis not present

## 2018-03-05 DIAGNOSIS — D631 Anemia in chronic kidney disease: Secondary | ICD-10-CM | POA: Diagnosis not present

## 2018-03-07 DIAGNOSIS — D631 Anemia in chronic kidney disease: Secondary | ICD-10-CM | POA: Diagnosis not present

## 2018-03-07 DIAGNOSIS — N186 End stage renal disease: Secondary | ICD-10-CM | POA: Diagnosis not present

## 2018-03-07 DIAGNOSIS — N2581 Secondary hyperparathyroidism of renal origin: Secondary | ICD-10-CM | POA: Diagnosis not present

## 2018-03-07 DIAGNOSIS — I159 Secondary hypertension, unspecified: Secondary | ICD-10-CM | POA: Diagnosis not present

## 2018-03-07 DIAGNOSIS — Z4931 Encounter for adequacy testing for hemodialysis: Secondary | ICD-10-CM | POA: Diagnosis not present

## 2018-03-07 DIAGNOSIS — D689 Coagulation defect, unspecified: Secondary | ICD-10-CM | POA: Diagnosis not present

## 2018-03-10 DIAGNOSIS — D689 Coagulation defect, unspecified: Secondary | ICD-10-CM | POA: Diagnosis not present

## 2018-03-10 DIAGNOSIS — N186 End stage renal disease: Secondary | ICD-10-CM | POA: Diagnosis not present

## 2018-03-10 DIAGNOSIS — N2581 Secondary hyperparathyroidism of renal origin: Secondary | ICD-10-CM | POA: Diagnosis not present

## 2018-03-10 DIAGNOSIS — I159 Secondary hypertension, unspecified: Secondary | ICD-10-CM | POA: Diagnosis not present

## 2018-03-10 DIAGNOSIS — D631 Anemia in chronic kidney disease: Secondary | ICD-10-CM | POA: Diagnosis not present

## 2018-03-10 DIAGNOSIS — Z4931 Encounter for adequacy testing for hemodialysis: Secondary | ICD-10-CM | POA: Diagnosis not present

## 2018-03-12 DIAGNOSIS — N186 End stage renal disease: Secondary | ICD-10-CM | POA: Diagnosis not present

## 2018-03-12 DIAGNOSIS — D631 Anemia in chronic kidney disease: Secondary | ICD-10-CM | POA: Diagnosis not present

## 2018-03-12 DIAGNOSIS — Z4931 Encounter for adequacy testing for hemodialysis: Secondary | ICD-10-CM | POA: Diagnosis not present

## 2018-03-12 DIAGNOSIS — D689 Coagulation defect, unspecified: Secondary | ICD-10-CM | POA: Diagnosis not present

## 2018-03-12 DIAGNOSIS — N2581 Secondary hyperparathyroidism of renal origin: Secondary | ICD-10-CM | POA: Diagnosis not present

## 2018-03-12 DIAGNOSIS — I159 Secondary hypertension, unspecified: Secondary | ICD-10-CM | POA: Diagnosis not present

## 2018-03-13 DIAGNOSIS — R140 Abdominal distension (gaseous): Secondary | ICD-10-CM | POA: Diagnosis not present

## 2018-03-13 DIAGNOSIS — R110 Nausea: Secondary | ICD-10-CM | POA: Diagnosis not present

## 2018-03-14 DIAGNOSIS — D631 Anemia in chronic kidney disease: Secondary | ICD-10-CM | POA: Diagnosis not present

## 2018-03-14 DIAGNOSIS — N2581 Secondary hyperparathyroidism of renal origin: Secondary | ICD-10-CM | POA: Diagnosis not present

## 2018-03-14 DIAGNOSIS — E1122 Type 2 diabetes mellitus with diabetic chronic kidney disease: Secondary | ICD-10-CM | POA: Diagnosis not present

## 2018-03-14 DIAGNOSIS — D689 Coagulation defect, unspecified: Secondary | ICD-10-CM | POA: Diagnosis not present

## 2018-03-14 DIAGNOSIS — Z4931 Encounter for adequacy testing for hemodialysis: Secondary | ICD-10-CM | POA: Diagnosis not present

## 2018-03-14 DIAGNOSIS — I159 Secondary hypertension, unspecified: Secondary | ICD-10-CM | POA: Diagnosis not present

## 2018-03-14 DIAGNOSIS — N186 End stage renal disease: Secondary | ICD-10-CM | POA: Diagnosis not present

## 2018-03-14 DIAGNOSIS — Z992 Dependence on renal dialysis: Secondary | ICD-10-CM | POA: Diagnosis not present

## 2018-03-17 DIAGNOSIS — D689 Coagulation defect, unspecified: Secondary | ICD-10-CM | POA: Diagnosis not present

## 2018-03-17 DIAGNOSIS — D631 Anemia in chronic kidney disease: Secondary | ICD-10-CM | POA: Diagnosis not present

## 2018-03-17 DIAGNOSIS — Z4931 Encounter for adequacy testing for hemodialysis: Secondary | ICD-10-CM | POA: Diagnosis not present

## 2018-03-17 DIAGNOSIS — N2581 Secondary hyperparathyroidism of renal origin: Secondary | ICD-10-CM | POA: Diagnosis not present

## 2018-03-17 DIAGNOSIS — I159 Secondary hypertension, unspecified: Secondary | ICD-10-CM | POA: Diagnosis not present

## 2018-03-17 DIAGNOSIS — N186 End stage renal disease: Secondary | ICD-10-CM | POA: Diagnosis not present

## 2018-03-17 DIAGNOSIS — D509 Iron deficiency anemia, unspecified: Secondary | ICD-10-CM | POA: Diagnosis not present

## 2018-03-19 DIAGNOSIS — N186 End stage renal disease: Secondary | ICD-10-CM | POA: Diagnosis not present

## 2018-03-19 DIAGNOSIS — Z4931 Encounter for adequacy testing for hemodialysis: Secondary | ICD-10-CM | POA: Diagnosis not present

## 2018-03-19 DIAGNOSIS — D509 Iron deficiency anemia, unspecified: Secondary | ICD-10-CM | POA: Diagnosis not present

## 2018-03-19 DIAGNOSIS — N2581 Secondary hyperparathyroidism of renal origin: Secondary | ICD-10-CM | POA: Diagnosis not present

## 2018-03-19 DIAGNOSIS — D689 Coagulation defect, unspecified: Secondary | ICD-10-CM | POA: Diagnosis not present

## 2018-03-19 DIAGNOSIS — D631 Anemia in chronic kidney disease: Secondary | ICD-10-CM | POA: Diagnosis not present

## 2018-03-19 DIAGNOSIS — I159 Secondary hypertension, unspecified: Secondary | ICD-10-CM | POA: Diagnosis not present

## 2018-03-20 DIAGNOSIS — I5033 Acute on chronic diastolic (congestive) heart failure: Secondary | ICD-10-CM | POA: Diagnosis not present

## 2018-03-21 DIAGNOSIS — N186 End stage renal disease: Secondary | ICD-10-CM | POA: Diagnosis not present

## 2018-03-21 DIAGNOSIS — J9610 Chronic respiratory failure, unspecified whether with hypoxia or hypercapnia: Secondary | ICD-10-CM | POA: Diagnosis not present

## 2018-03-21 DIAGNOSIS — D689 Coagulation defect, unspecified: Secondary | ICD-10-CM | POA: Diagnosis not present

## 2018-03-21 DIAGNOSIS — D509 Iron deficiency anemia, unspecified: Secondary | ICD-10-CM | POA: Diagnosis not present

## 2018-03-21 DIAGNOSIS — Z4931 Encounter for adequacy testing for hemodialysis: Secondary | ICD-10-CM | POA: Diagnosis not present

## 2018-03-21 DIAGNOSIS — N2581 Secondary hyperparathyroidism of renal origin: Secondary | ICD-10-CM | POA: Diagnosis not present

## 2018-03-21 DIAGNOSIS — D631 Anemia in chronic kidney disease: Secondary | ICD-10-CM | POA: Diagnosis not present

## 2018-03-21 DIAGNOSIS — I159 Secondary hypertension, unspecified: Secondary | ICD-10-CM | POA: Diagnosis not present

## 2018-03-21 DIAGNOSIS — G610 Guillain-Barre syndrome: Secondary | ICD-10-CM | POA: Diagnosis not present

## 2018-03-24 DIAGNOSIS — D689 Coagulation defect, unspecified: Secondary | ICD-10-CM | POA: Diagnosis not present

## 2018-03-24 DIAGNOSIS — Z4931 Encounter for adequacy testing for hemodialysis: Secondary | ICD-10-CM | POA: Diagnosis not present

## 2018-03-24 DIAGNOSIS — D631 Anemia in chronic kidney disease: Secondary | ICD-10-CM | POA: Diagnosis not present

## 2018-03-24 DIAGNOSIS — N186 End stage renal disease: Secondary | ICD-10-CM | POA: Diagnosis not present

## 2018-03-24 DIAGNOSIS — I159 Secondary hypertension, unspecified: Secondary | ICD-10-CM | POA: Diagnosis not present

## 2018-03-24 DIAGNOSIS — N2581 Secondary hyperparathyroidism of renal origin: Secondary | ICD-10-CM | POA: Diagnosis not present

## 2018-03-24 DIAGNOSIS — D509 Iron deficiency anemia, unspecified: Secondary | ICD-10-CM | POA: Diagnosis not present

## 2018-03-26 DIAGNOSIS — Z4931 Encounter for adequacy testing for hemodialysis: Secondary | ICD-10-CM | POA: Diagnosis not present

## 2018-03-26 DIAGNOSIS — I159 Secondary hypertension, unspecified: Secondary | ICD-10-CM | POA: Diagnosis not present

## 2018-03-26 DIAGNOSIS — N186 End stage renal disease: Secondary | ICD-10-CM | POA: Diagnosis not present

## 2018-03-26 DIAGNOSIS — N2581 Secondary hyperparathyroidism of renal origin: Secondary | ICD-10-CM | POA: Diagnosis not present

## 2018-03-26 DIAGNOSIS — D509 Iron deficiency anemia, unspecified: Secondary | ICD-10-CM | POA: Diagnosis not present

## 2018-03-26 DIAGNOSIS — D631 Anemia in chronic kidney disease: Secondary | ICD-10-CM | POA: Diagnosis not present

## 2018-03-26 DIAGNOSIS — E1122 Type 2 diabetes mellitus with diabetic chronic kidney disease: Secondary | ICD-10-CM | POA: Diagnosis not present

## 2018-03-26 DIAGNOSIS — D689 Coagulation defect, unspecified: Secondary | ICD-10-CM | POA: Diagnosis not present

## 2018-03-28 DIAGNOSIS — N186 End stage renal disease: Secondary | ICD-10-CM | POA: Diagnosis not present

## 2018-03-28 DIAGNOSIS — Z4931 Encounter for adequacy testing for hemodialysis: Secondary | ICD-10-CM | POA: Diagnosis not present

## 2018-03-28 DIAGNOSIS — J90 Pleural effusion, not elsewhere classified: Secondary | ICD-10-CM | POA: Diagnosis not present

## 2018-03-28 DIAGNOSIS — Z9049 Acquired absence of other specified parts of digestive tract: Secondary | ICD-10-CM | POA: Diagnosis not present

## 2018-03-28 DIAGNOSIS — D689 Coagulation defect, unspecified: Secondary | ICD-10-CM | POA: Diagnosis not present

## 2018-03-28 DIAGNOSIS — Z885 Allergy status to narcotic agent status: Secondary | ICD-10-CM | POA: Diagnosis not present

## 2018-03-28 DIAGNOSIS — R0602 Shortness of breath: Secondary | ICD-10-CM | POA: Diagnosis not present

## 2018-03-28 DIAGNOSIS — Z794 Long term (current) use of insulin: Secondary | ICD-10-CM | POA: Diagnosis not present

## 2018-03-28 DIAGNOSIS — R0682 Tachypnea, not elsewhere classified: Secondary | ICD-10-CM | POA: Diagnosis not present

## 2018-03-28 DIAGNOSIS — J918 Pleural effusion in other conditions classified elsewhere: Secondary | ICD-10-CM | POA: Diagnosis not present

## 2018-03-28 DIAGNOSIS — Z887 Allergy status to serum and vaccine status: Secondary | ICD-10-CM | POA: Diagnosis not present

## 2018-03-28 DIAGNOSIS — D509 Iron deficiency anemia, unspecified: Secondary | ICD-10-CM | POA: Diagnosis not present

## 2018-03-28 DIAGNOSIS — Z87891 Personal history of nicotine dependence: Secondary | ICD-10-CM | POA: Diagnosis not present

## 2018-03-28 DIAGNOSIS — I159 Secondary hypertension, unspecified: Secondary | ICD-10-CM | POA: Diagnosis not present

## 2018-03-28 DIAGNOSIS — I4891 Unspecified atrial fibrillation: Secondary | ICD-10-CM | POA: Diagnosis not present

## 2018-03-28 DIAGNOSIS — Z7951 Long term (current) use of inhaled steroids: Secondary | ICD-10-CM | POA: Diagnosis not present

## 2018-03-28 DIAGNOSIS — E1122 Type 2 diabetes mellitus with diabetic chronic kidney disease: Secondary | ICD-10-CM | POA: Diagnosis not present

## 2018-03-28 DIAGNOSIS — R9431 Abnormal electrocardiogram [ECG] [EKG]: Secondary | ICD-10-CM | POA: Diagnosis not present

## 2018-03-28 DIAGNOSIS — Z905 Acquired absence of kidney: Secondary | ICD-10-CM | POA: Diagnosis not present

## 2018-03-28 DIAGNOSIS — N2581 Secondary hyperparathyroidism of renal origin: Secondary | ICD-10-CM | POA: Diagnosis not present

## 2018-03-28 DIAGNOSIS — R05 Cough: Secondary | ICD-10-CM | POA: Diagnosis not present

## 2018-03-28 DIAGNOSIS — I120 Hypertensive chronic kidney disease with stage 5 chronic kidney disease or end stage renal disease: Secondary | ICD-10-CM | POA: Diagnosis not present

## 2018-03-28 DIAGNOSIS — Z880 Allergy status to penicillin: Secondary | ICD-10-CM | POA: Diagnosis not present

## 2018-03-28 DIAGNOSIS — D631 Anemia in chronic kidney disease: Secondary | ICD-10-CM | POA: Diagnosis not present

## 2018-03-31 DIAGNOSIS — Z4931 Encounter for adequacy testing for hemodialysis: Secondary | ICD-10-CM | POA: Diagnosis not present

## 2018-03-31 DIAGNOSIS — D689 Coagulation defect, unspecified: Secondary | ICD-10-CM | POA: Diagnosis not present

## 2018-03-31 DIAGNOSIS — I159 Secondary hypertension, unspecified: Secondary | ICD-10-CM | POA: Diagnosis not present

## 2018-03-31 DIAGNOSIS — N2581 Secondary hyperparathyroidism of renal origin: Secondary | ICD-10-CM | POA: Diagnosis not present

## 2018-03-31 DIAGNOSIS — D631 Anemia in chronic kidney disease: Secondary | ICD-10-CM | POA: Diagnosis not present

## 2018-03-31 DIAGNOSIS — D509 Iron deficiency anemia, unspecified: Secondary | ICD-10-CM | POA: Diagnosis not present

## 2018-03-31 DIAGNOSIS — N186 End stage renal disease: Secondary | ICD-10-CM | POA: Diagnosis not present

## 2018-04-01 DIAGNOSIS — Z794 Long term (current) use of insulin: Secondary | ICD-10-CM | POA: Diagnosis not present

## 2018-04-01 DIAGNOSIS — K449 Diaphragmatic hernia without obstruction or gangrene: Secondary | ICD-10-CM | POA: Diagnosis not present

## 2018-04-01 DIAGNOSIS — Z888 Allergy status to other drugs, medicaments and biological substances status: Secondary | ICD-10-CM | POA: Diagnosis not present

## 2018-04-01 DIAGNOSIS — R109 Unspecified abdominal pain: Secondary | ICD-10-CM | POA: Diagnosis not present

## 2018-04-01 DIAGNOSIS — R509 Fever, unspecified: Secondary | ICD-10-CM | POA: Diagnosis not present

## 2018-04-01 DIAGNOSIS — Z9049 Acquired absence of other specified parts of digestive tract: Secondary | ICD-10-CM | POA: Diagnosis not present

## 2018-04-01 DIAGNOSIS — R05 Cough: Secondary | ICD-10-CM | POA: Diagnosis not present

## 2018-04-01 DIAGNOSIS — I132 Hypertensive heart and chronic kidney disease with heart failure and with stage 5 chronic kidney disease, or end stage renal disease: Secondary | ICD-10-CM | POA: Diagnosis not present

## 2018-04-01 DIAGNOSIS — N186 End stage renal disease: Secondary | ICD-10-CM | POA: Diagnosis not present

## 2018-04-01 DIAGNOSIS — G610 Guillain-Barre syndrome: Secondary | ICD-10-CM | POA: Diagnosis not present

## 2018-04-01 DIAGNOSIS — R1013 Epigastric pain: Secondary | ICD-10-CM | POA: Diagnosis not present

## 2018-04-01 DIAGNOSIS — Z905 Acquired absence of kidney: Secondary | ICD-10-CM | POA: Diagnosis not present

## 2018-04-01 DIAGNOSIS — Z885 Allergy status to narcotic agent status: Secondary | ICD-10-CM | POA: Diagnosis not present

## 2018-04-01 DIAGNOSIS — Z87891 Personal history of nicotine dependence: Secondary | ICD-10-CM | POA: Diagnosis not present

## 2018-04-01 DIAGNOSIS — I5032 Chronic diastolic (congestive) heart failure: Secondary | ICD-10-CM | POA: Diagnosis not present

## 2018-04-01 DIAGNOSIS — Z992 Dependence on renal dialysis: Secondary | ICD-10-CM | POA: Diagnosis not present

## 2018-04-01 DIAGNOSIS — E1122 Type 2 diabetes mellitus with diabetic chronic kidney disease: Secondary | ICD-10-CM | POA: Diagnosis not present

## 2018-04-01 DIAGNOSIS — I4891 Unspecified atrial fibrillation: Secondary | ICD-10-CM | POA: Diagnosis not present

## 2018-04-01 DIAGNOSIS — J811 Chronic pulmonary edema: Secondary | ICD-10-CM | POA: Diagnosis not present

## 2018-04-02 DIAGNOSIS — R109 Unspecified abdominal pain: Secondary | ICD-10-CM | POA: Diagnosis not present

## 2018-04-02 DIAGNOSIS — E1122 Type 2 diabetes mellitus with diabetic chronic kidney disease: Secondary | ICD-10-CM | POA: Diagnosis not present

## 2018-04-02 DIAGNOSIS — Z885 Allergy status to narcotic agent status: Secondary | ICD-10-CM | POA: Diagnosis not present

## 2018-04-02 DIAGNOSIS — D631 Anemia in chronic kidney disease: Secondary | ICD-10-CM | POA: Diagnosis not present

## 2018-04-02 DIAGNOSIS — Z992 Dependence on renal dialysis: Secondary | ICD-10-CM | POA: Diagnosis not present

## 2018-04-02 DIAGNOSIS — I132 Hypertensive heart and chronic kidney disease with heart failure and with stage 5 chronic kidney disease, or end stage renal disease: Secondary | ICD-10-CM | POA: Diagnosis not present

## 2018-04-02 DIAGNOSIS — I5030 Unspecified diastolic (congestive) heart failure: Secondary | ICD-10-CM | POA: Diagnosis not present

## 2018-04-02 DIAGNOSIS — Z905 Acquired absence of kidney: Secondary | ICD-10-CM | POA: Diagnosis not present

## 2018-04-02 DIAGNOSIS — I5032 Chronic diastolic (congestive) heart failure: Secondary | ICD-10-CM | POA: Diagnosis not present

## 2018-04-02 DIAGNOSIS — N186 End stage renal disease: Secondary | ICD-10-CM | POA: Diagnosis not present

## 2018-04-02 DIAGNOSIS — Z888 Allergy status to other drugs, medicaments and biological substances status: Secondary | ICD-10-CM | POA: Diagnosis not present

## 2018-04-02 DIAGNOSIS — K449 Diaphragmatic hernia without obstruction or gangrene: Secondary | ICD-10-CM | POA: Diagnosis not present

## 2018-04-02 DIAGNOSIS — Z87891 Personal history of nicotine dependence: Secondary | ICD-10-CM | POA: Diagnosis not present

## 2018-04-02 DIAGNOSIS — I4891 Unspecified atrial fibrillation: Secondary | ICD-10-CM | POA: Diagnosis not present

## 2018-04-02 DIAGNOSIS — Z794 Long term (current) use of insulin: Secondary | ICD-10-CM | POA: Diagnosis not present

## 2018-04-02 DIAGNOSIS — Z9049 Acquired absence of other specified parts of digestive tract: Secondary | ICD-10-CM | POA: Diagnosis not present

## 2018-04-02 DIAGNOSIS — G610 Guillain-Barre syndrome: Secondary | ICD-10-CM | POA: Diagnosis not present

## 2018-04-03 DIAGNOSIS — Z992 Dependence on renal dialysis: Secondary | ICD-10-CM | POA: Diagnosis not present

## 2018-04-03 DIAGNOSIS — Z888 Allergy status to other drugs, medicaments and biological substances status: Secondary | ICD-10-CM | POA: Diagnosis not present

## 2018-04-03 DIAGNOSIS — Z87891 Personal history of nicotine dependence: Secondary | ICD-10-CM | POA: Diagnosis not present

## 2018-04-03 DIAGNOSIS — R110 Nausea: Secondary | ICD-10-CM | POA: Diagnosis not present

## 2018-04-03 DIAGNOSIS — R109 Unspecified abdominal pain: Secondary | ICD-10-CM | POA: Diagnosis not present

## 2018-04-03 DIAGNOSIS — I4891 Unspecified atrial fibrillation: Secondary | ICD-10-CM | POA: Diagnosis not present

## 2018-04-03 DIAGNOSIS — G610 Guillain-Barre syndrome: Secondary | ICD-10-CM | POA: Diagnosis not present

## 2018-04-03 DIAGNOSIS — I132 Hypertensive heart and chronic kidney disease with heart failure and with stage 5 chronic kidney disease, or end stage renal disease: Secondary | ICD-10-CM | POA: Diagnosis not present

## 2018-04-03 DIAGNOSIS — Z794 Long term (current) use of insulin: Secondary | ICD-10-CM | POA: Diagnosis not present

## 2018-04-03 DIAGNOSIS — K449 Diaphragmatic hernia without obstruction or gangrene: Secondary | ICD-10-CM | POA: Diagnosis not present

## 2018-04-03 DIAGNOSIS — Z905 Acquired absence of kidney: Secondary | ICD-10-CM | POA: Diagnosis not present

## 2018-04-03 DIAGNOSIS — E1122 Type 2 diabetes mellitus with diabetic chronic kidney disease: Secondary | ICD-10-CM | POA: Diagnosis not present

## 2018-04-03 DIAGNOSIS — I5032 Chronic diastolic (congestive) heart failure: Secondary | ICD-10-CM | POA: Diagnosis not present

## 2018-04-03 DIAGNOSIS — I120 Hypertensive chronic kidney disease with stage 5 chronic kidney disease or end stage renal disease: Secondary | ICD-10-CM | POA: Diagnosis not present

## 2018-04-03 DIAGNOSIS — Z9049 Acquired absence of other specified parts of digestive tract: Secondary | ICD-10-CM | POA: Diagnosis not present

## 2018-04-03 DIAGNOSIS — Z885 Allergy status to narcotic agent status: Secondary | ICD-10-CM | POA: Diagnosis not present

## 2018-04-03 DIAGNOSIS — N186 End stage renal disease: Secondary | ICD-10-CM | POA: Diagnosis not present

## 2018-04-04 DIAGNOSIS — I120 Hypertensive chronic kidney disease with stage 5 chronic kidney disease or end stage renal disease: Secondary | ICD-10-CM | POA: Diagnosis not present

## 2018-04-04 DIAGNOSIS — Z888 Allergy status to other drugs, medicaments and biological substances status: Secondary | ICD-10-CM | POA: Diagnosis not present

## 2018-04-04 DIAGNOSIS — R110 Nausea: Secondary | ICD-10-CM | POA: Diagnosis not present

## 2018-04-04 DIAGNOSIS — N186 End stage renal disease: Secondary | ICD-10-CM | POA: Diagnosis not present

## 2018-04-04 DIAGNOSIS — R109 Unspecified abdominal pain: Secondary | ICD-10-CM | POA: Diagnosis not present

## 2018-04-04 DIAGNOSIS — I5032 Chronic diastolic (congestive) heart failure: Secondary | ICD-10-CM | POA: Diagnosis not present

## 2018-04-04 DIAGNOSIS — Z905 Acquired absence of kidney: Secondary | ICD-10-CM | POA: Diagnosis not present

## 2018-04-04 DIAGNOSIS — R197 Diarrhea, unspecified: Secondary | ICD-10-CM | POA: Diagnosis not present

## 2018-04-04 DIAGNOSIS — Z87891 Personal history of nicotine dependence: Secondary | ICD-10-CM | POA: Diagnosis not present

## 2018-04-04 DIAGNOSIS — G610 Guillain-Barre syndrome: Secondary | ICD-10-CM | POA: Diagnosis not present

## 2018-04-04 DIAGNOSIS — Z794 Long term (current) use of insulin: Secondary | ICD-10-CM | POA: Diagnosis not present

## 2018-04-04 DIAGNOSIS — E1122 Type 2 diabetes mellitus with diabetic chronic kidney disease: Secondary | ICD-10-CM | POA: Diagnosis not present

## 2018-04-04 DIAGNOSIS — I4891 Unspecified atrial fibrillation: Secondary | ICD-10-CM | POA: Diagnosis not present

## 2018-04-04 DIAGNOSIS — Z885 Allergy status to narcotic agent status: Secondary | ICD-10-CM | POA: Diagnosis not present

## 2018-04-04 DIAGNOSIS — K449 Diaphragmatic hernia without obstruction or gangrene: Secondary | ICD-10-CM | POA: Diagnosis not present

## 2018-04-04 DIAGNOSIS — I132 Hypertensive heart and chronic kidney disease with heart failure and with stage 5 chronic kidney disease, or end stage renal disease: Secondary | ICD-10-CM | POA: Diagnosis not present

## 2018-04-04 DIAGNOSIS — Z9049 Acquired absence of other specified parts of digestive tract: Secondary | ICD-10-CM | POA: Diagnosis not present

## 2018-04-04 DIAGNOSIS — Z992 Dependence on renal dialysis: Secondary | ICD-10-CM | POA: Diagnosis not present

## 2018-04-05 DIAGNOSIS — I132 Hypertensive heart and chronic kidney disease with heart failure and with stage 5 chronic kidney disease, or end stage renal disease: Secondary | ICD-10-CM | POA: Diagnosis not present

## 2018-04-05 DIAGNOSIS — Z888 Allergy status to other drugs, medicaments and biological substances status: Secondary | ICD-10-CM | POA: Diagnosis not present

## 2018-04-05 DIAGNOSIS — Z992 Dependence on renal dialysis: Secondary | ICD-10-CM | POA: Diagnosis not present

## 2018-04-05 DIAGNOSIS — I5032 Chronic diastolic (congestive) heart failure: Secondary | ICD-10-CM | POA: Diagnosis not present

## 2018-04-05 DIAGNOSIS — Z87891 Personal history of nicotine dependence: Secondary | ICD-10-CM | POA: Diagnosis not present

## 2018-04-05 DIAGNOSIS — K449 Diaphragmatic hernia without obstruction or gangrene: Secondary | ICD-10-CM | POA: Diagnosis not present

## 2018-04-05 DIAGNOSIS — Z885 Allergy status to narcotic agent status: Secondary | ICD-10-CM | POA: Diagnosis not present

## 2018-04-05 DIAGNOSIS — G610 Guillain-Barre syndrome: Secondary | ICD-10-CM | POA: Diagnosis not present

## 2018-04-05 DIAGNOSIS — Z9049 Acquired absence of other specified parts of digestive tract: Secondary | ICD-10-CM | POA: Diagnosis not present

## 2018-04-05 DIAGNOSIS — E1122 Type 2 diabetes mellitus with diabetic chronic kidney disease: Secondary | ICD-10-CM | POA: Diagnosis not present

## 2018-04-05 DIAGNOSIS — N186 End stage renal disease: Secondary | ICD-10-CM | POA: Diagnosis not present

## 2018-04-05 DIAGNOSIS — Z794 Long term (current) use of insulin: Secondary | ICD-10-CM | POA: Diagnosis not present

## 2018-04-05 DIAGNOSIS — Z905 Acquired absence of kidney: Secondary | ICD-10-CM | POA: Diagnosis not present

## 2018-04-05 DIAGNOSIS — R109 Unspecified abdominal pain: Secondary | ICD-10-CM | POA: Diagnosis not present

## 2018-04-05 DIAGNOSIS — I120 Hypertensive chronic kidney disease with stage 5 chronic kidney disease or end stage renal disease: Secondary | ICD-10-CM | POA: Diagnosis not present

## 2018-04-05 DIAGNOSIS — I4891 Unspecified atrial fibrillation: Secondary | ICD-10-CM | POA: Diagnosis not present

## 2018-04-06 DIAGNOSIS — I4891 Unspecified atrial fibrillation: Secondary | ICD-10-CM | POA: Diagnosis not present

## 2018-04-06 DIAGNOSIS — R109 Unspecified abdominal pain: Secondary | ICD-10-CM | POA: Diagnosis not present

## 2018-04-06 DIAGNOSIS — Z87891 Personal history of nicotine dependence: Secondary | ICD-10-CM | POA: Diagnosis not present

## 2018-04-06 DIAGNOSIS — N186 End stage renal disease: Secondary | ICD-10-CM | POA: Diagnosis not present

## 2018-04-06 DIAGNOSIS — Z888 Allergy status to other drugs, medicaments and biological substances status: Secondary | ICD-10-CM | POA: Diagnosis not present

## 2018-04-06 DIAGNOSIS — E1122 Type 2 diabetes mellitus with diabetic chronic kidney disease: Secondary | ICD-10-CM | POA: Diagnosis not present

## 2018-04-06 DIAGNOSIS — I132 Hypertensive heart and chronic kidney disease with heart failure and with stage 5 chronic kidney disease, or end stage renal disease: Secondary | ICD-10-CM | POA: Diagnosis not present

## 2018-04-06 DIAGNOSIS — I120 Hypertensive chronic kidney disease with stage 5 chronic kidney disease or end stage renal disease: Secondary | ICD-10-CM | POA: Diagnosis not present

## 2018-04-06 DIAGNOSIS — K449 Diaphragmatic hernia without obstruction or gangrene: Secondary | ICD-10-CM | POA: Diagnosis not present

## 2018-04-06 DIAGNOSIS — G610 Guillain-Barre syndrome: Secondary | ICD-10-CM | POA: Diagnosis not present

## 2018-04-06 DIAGNOSIS — Z9049 Acquired absence of other specified parts of digestive tract: Secondary | ICD-10-CM | POA: Diagnosis not present

## 2018-04-06 DIAGNOSIS — Z794 Long term (current) use of insulin: Secondary | ICD-10-CM | POA: Diagnosis not present

## 2018-04-06 DIAGNOSIS — Z992 Dependence on renal dialysis: Secondary | ICD-10-CM | POA: Diagnosis not present

## 2018-04-06 DIAGNOSIS — I5032 Chronic diastolic (congestive) heart failure: Secondary | ICD-10-CM | POA: Diagnosis not present

## 2018-04-06 DIAGNOSIS — Z905 Acquired absence of kidney: Secondary | ICD-10-CM | POA: Diagnosis not present

## 2018-04-06 DIAGNOSIS — Z885 Allergy status to narcotic agent status: Secondary | ICD-10-CM | POA: Diagnosis not present

## 2018-04-07 DIAGNOSIS — N2581 Secondary hyperparathyroidism of renal origin: Secondary | ICD-10-CM | POA: Diagnosis not present

## 2018-04-07 DIAGNOSIS — D689 Coagulation defect, unspecified: Secondary | ICD-10-CM | POA: Diagnosis not present

## 2018-04-07 DIAGNOSIS — D509 Iron deficiency anemia, unspecified: Secondary | ICD-10-CM | POA: Diagnosis not present

## 2018-04-07 DIAGNOSIS — D631 Anemia in chronic kidney disease: Secondary | ICD-10-CM | POA: Diagnosis not present

## 2018-04-07 DIAGNOSIS — N186 End stage renal disease: Secondary | ICD-10-CM | POA: Diagnosis not present

## 2018-04-07 DIAGNOSIS — I159 Secondary hypertension, unspecified: Secondary | ICD-10-CM | POA: Diagnosis not present

## 2018-04-07 DIAGNOSIS — Z4931 Encounter for adequacy testing for hemodialysis: Secondary | ICD-10-CM | POA: Diagnosis not present

## 2018-04-08 DIAGNOSIS — J9611 Chronic respiratory failure with hypoxia: Secondary | ICD-10-CM | POA: Diagnosis not present

## 2018-04-08 DIAGNOSIS — I4891 Unspecified atrial fibrillation: Secondary | ICD-10-CM | POA: Diagnosis not present

## 2018-04-08 DIAGNOSIS — N186 End stage renal disease: Secondary | ICD-10-CM | POA: Diagnosis not present

## 2018-04-08 DIAGNOSIS — G610 Guillain-Barre syndrome: Secondary | ICD-10-CM | POA: Diagnosis not present

## 2018-04-08 DIAGNOSIS — Z794 Long term (current) use of insulin: Secondary | ICD-10-CM | POA: Diagnosis not present

## 2018-04-08 DIAGNOSIS — Z85528 Personal history of other malignant neoplasm of kidney: Secondary | ICD-10-CM | POA: Diagnosis not present

## 2018-04-08 DIAGNOSIS — K8500 Idiopathic acute pancreatitis without necrosis or infection: Secondary | ICD-10-CM | POA: Diagnosis not present

## 2018-04-08 DIAGNOSIS — I132 Hypertensive heart and chronic kidney disease with heart failure and with stage 5 chronic kidney disease, or end stage renal disease: Secondary | ICD-10-CM | POA: Diagnosis not present

## 2018-04-08 DIAGNOSIS — G8929 Other chronic pain: Secondary | ICD-10-CM | POA: Diagnosis not present

## 2018-04-08 DIAGNOSIS — E1142 Type 2 diabetes mellitus with diabetic polyneuropathy: Secondary | ICD-10-CM | POA: Diagnosis not present

## 2018-04-08 DIAGNOSIS — Z992 Dependence on renal dialysis: Secondary | ICD-10-CM | POA: Diagnosis not present

## 2018-04-08 DIAGNOSIS — Z9981 Dependence on supplemental oxygen: Secondary | ICD-10-CM | POA: Diagnosis not present

## 2018-04-08 DIAGNOSIS — K6389 Other specified diseases of intestine: Secondary | ICD-10-CM | POA: Diagnosis not present

## 2018-04-08 DIAGNOSIS — E1122 Type 2 diabetes mellitus with diabetic chronic kidney disease: Secondary | ICD-10-CM | POA: Diagnosis not present

## 2018-04-08 DIAGNOSIS — Z905 Acquired absence of kidney: Secondary | ICD-10-CM | POA: Diagnosis not present

## 2018-04-08 DIAGNOSIS — Z87891 Personal history of nicotine dependence: Secondary | ICD-10-CM | POA: Diagnosis not present

## 2018-04-08 DIAGNOSIS — Z8551 Personal history of malignant neoplasm of bladder: Secondary | ICD-10-CM | POA: Diagnosis not present

## 2018-04-08 DIAGNOSIS — I5032 Chronic diastolic (congestive) heart failure: Secondary | ICD-10-CM | POA: Diagnosis not present

## 2018-04-08 DIAGNOSIS — D631 Anemia in chronic kidney disease: Secondary | ICD-10-CM | POA: Diagnosis not present

## 2018-04-09 DIAGNOSIS — Z4931 Encounter for adequacy testing for hemodialysis: Secondary | ICD-10-CM | POA: Diagnosis not present

## 2018-04-09 DIAGNOSIS — D689 Coagulation defect, unspecified: Secondary | ICD-10-CM | POA: Diagnosis not present

## 2018-04-09 DIAGNOSIS — N186 End stage renal disease: Secondary | ICD-10-CM | POA: Diagnosis not present

## 2018-04-09 DIAGNOSIS — I159 Secondary hypertension, unspecified: Secondary | ICD-10-CM | POA: Diagnosis not present

## 2018-04-09 DIAGNOSIS — D509 Iron deficiency anemia, unspecified: Secondary | ICD-10-CM | POA: Diagnosis not present

## 2018-04-09 DIAGNOSIS — D631 Anemia in chronic kidney disease: Secondary | ICD-10-CM | POA: Diagnosis not present

## 2018-04-09 DIAGNOSIS — N2581 Secondary hyperparathyroidism of renal origin: Secondary | ICD-10-CM | POA: Diagnosis not present

## 2018-04-10 DIAGNOSIS — K6389 Other specified diseases of intestine: Secondary | ICD-10-CM | POA: Diagnosis not present

## 2018-04-10 DIAGNOSIS — I132 Hypertensive heart and chronic kidney disease with heart failure and with stage 5 chronic kidney disease, or end stage renal disease: Secondary | ICD-10-CM | POA: Diagnosis not present

## 2018-04-10 DIAGNOSIS — Z905 Acquired absence of kidney: Secondary | ICD-10-CM | POA: Diagnosis not present

## 2018-04-10 DIAGNOSIS — Z8551 Personal history of malignant neoplasm of bladder: Secondary | ICD-10-CM | POA: Diagnosis not present

## 2018-04-10 DIAGNOSIS — Z992 Dependence on renal dialysis: Secondary | ICD-10-CM | POA: Diagnosis not present

## 2018-04-10 DIAGNOSIS — E1122 Type 2 diabetes mellitus with diabetic chronic kidney disease: Secondary | ICD-10-CM | POA: Diagnosis not present

## 2018-04-10 DIAGNOSIS — K8500 Idiopathic acute pancreatitis without necrosis or infection: Secondary | ICD-10-CM | POA: Diagnosis not present

## 2018-04-10 DIAGNOSIS — I5032 Chronic diastolic (congestive) heart failure: Secondary | ICD-10-CM | POA: Diagnosis not present

## 2018-04-10 DIAGNOSIS — N186 End stage renal disease: Secondary | ICD-10-CM | POA: Diagnosis not present

## 2018-04-10 DIAGNOSIS — G610 Guillain-Barre syndrome: Secondary | ICD-10-CM | POA: Diagnosis not present

## 2018-04-10 DIAGNOSIS — J9611 Chronic respiratory failure with hypoxia: Secondary | ICD-10-CM | POA: Diagnosis not present

## 2018-04-10 DIAGNOSIS — Z9981 Dependence on supplemental oxygen: Secondary | ICD-10-CM | POA: Diagnosis not present

## 2018-04-10 DIAGNOSIS — Z794 Long term (current) use of insulin: Secondary | ICD-10-CM | POA: Diagnosis not present

## 2018-04-10 DIAGNOSIS — G8929 Other chronic pain: Secondary | ICD-10-CM | POA: Diagnosis not present

## 2018-04-10 DIAGNOSIS — Z85528 Personal history of other malignant neoplasm of kidney: Secondary | ICD-10-CM | POA: Diagnosis not present

## 2018-04-10 DIAGNOSIS — E1142 Type 2 diabetes mellitus with diabetic polyneuropathy: Secondary | ICD-10-CM | POA: Diagnosis not present

## 2018-04-10 DIAGNOSIS — Z87891 Personal history of nicotine dependence: Secondary | ICD-10-CM | POA: Diagnosis not present

## 2018-04-10 DIAGNOSIS — D631 Anemia in chronic kidney disease: Secondary | ICD-10-CM | POA: Diagnosis not present

## 2018-04-10 DIAGNOSIS — I4891 Unspecified atrial fibrillation: Secondary | ICD-10-CM | POA: Diagnosis not present

## 2018-04-11 DIAGNOSIS — R0600 Dyspnea, unspecified: Secondary | ICD-10-CM | POA: Diagnosis not present

## 2018-04-11 DIAGNOSIS — D631 Anemia in chronic kidney disease: Secondary | ICD-10-CM | POA: Diagnosis not present

## 2018-04-11 DIAGNOSIS — I159 Secondary hypertension, unspecified: Secondary | ICD-10-CM | POA: Diagnosis not present

## 2018-04-11 DIAGNOSIS — D689 Coagulation defect, unspecified: Secondary | ICD-10-CM | POA: Diagnosis not present

## 2018-04-11 DIAGNOSIS — N186 End stage renal disease: Secondary | ICD-10-CM | POA: Diagnosis not present

## 2018-04-11 DIAGNOSIS — N2581 Secondary hyperparathyroidism of renal origin: Secondary | ICD-10-CM | POA: Diagnosis not present

## 2018-04-11 DIAGNOSIS — Z4931 Encounter for adequacy testing for hemodialysis: Secondary | ICD-10-CM | POA: Diagnosis not present

## 2018-04-11 DIAGNOSIS — D509 Iron deficiency anemia, unspecified: Secondary | ICD-10-CM | POA: Diagnosis not present

## 2018-04-12 DIAGNOSIS — N186 End stage renal disease: Secondary | ICD-10-CM | POA: Diagnosis not present

## 2018-04-12 DIAGNOSIS — E1122 Type 2 diabetes mellitus with diabetic chronic kidney disease: Secondary | ICD-10-CM | POA: Diagnosis not present

## 2018-04-12 DIAGNOSIS — Z992 Dependence on renal dialysis: Secondary | ICD-10-CM | POA: Diagnosis not present

## 2018-04-14 DIAGNOSIS — Z4931 Encounter for adequacy testing for hemodialysis: Secondary | ICD-10-CM | POA: Diagnosis not present

## 2018-04-14 DIAGNOSIS — D631 Anemia in chronic kidney disease: Secondary | ICD-10-CM | POA: Diagnosis not present

## 2018-04-14 DIAGNOSIS — N2581 Secondary hyperparathyroidism of renal origin: Secondary | ICD-10-CM | POA: Diagnosis not present

## 2018-04-14 DIAGNOSIS — D509 Iron deficiency anemia, unspecified: Secondary | ICD-10-CM | POA: Diagnosis not present

## 2018-04-14 DIAGNOSIS — N186 End stage renal disease: Secondary | ICD-10-CM | POA: Diagnosis not present

## 2018-04-14 DIAGNOSIS — R51 Headache: Secondary | ICD-10-CM | POA: Diagnosis not present

## 2018-04-14 DIAGNOSIS — D689 Coagulation defect, unspecified: Secondary | ICD-10-CM | POA: Diagnosis not present

## 2018-04-14 DIAGNOSIS — I159 Secondary hypertension, unspecified: Secondary | ICD-10-CM | POA: Diagnosis not present

## 2018-04-16 DIAGNOSIS — N186 End stage renal disease: Secondary | ICD-10-CM | POA: Diagnosis not present

## 2018-04-16 DIAGNOSIS — R51 Headache: Secondary | ICD-10-CM | POA: Diagnosis not present

## 2018-04-16 DIAGNOSIS — D689 Coagulation defect, unspecified: Secondary | ICD-10-CM | POA: Diagnosis not present

## 2018-04-16 DIAGNOSIS — I159 Secondary hypertension, unspecified: Secondary | ICD-10-CM | POA: Diagnosis not present

## 2018-04-16 DIAGNOSIS — Z4931 Encounter for adequacy testing for hemodialysis: Secondary | ICD-10-CM | POA: Diagnosis not present

## 2018-04-16 DIAGNOSIS — D631 Anemia in chronic kidney disease: Secondary | ICD-10-CM | POA: Diagnosis not present

## 2018-04-16 DIAGNOSIS — N2581 Secondary hyperparathyroidism of renal origin: Secondary | ICD-10-CM | POA: Diagnosis not present

## 2018-04-16 DIAGNOSIS — D509 Iron deficiency anemia, unspecified: Secondary | ICD-10-CM | POA: Diagnosis not present

## 2018-04-18 DIAGNOSIS — R062 Wheezing: Secondary | ICD-10-CM | POA: Diagnosis not present

## 2018-04-18 DIAGNOSIS — D689 Coagulation defect, unspecified: Secondary | ICD-10-CM | POA: Diagnosis not present

## 2018-04-18 DIAGNOSIS — N2581 Secondary hyperparathyroidism of renal origin: Secondary | ICD-10-CM | POA: Diagnosis not present

## 2018-04-18 DIAGNOSIS — D631 Anemia in chronic kidney disease: Secondary | ICD-10-CM | POA: Diagnosis not present

## 2018-04-18 DIAGNOSIS — Z4931 Encounter for adequacy testing for hemodialysis: Secondary | ICD-10-CM | POA: Diagnosis not present

## 2018-04-18 DIAGNOSIS — I159 Secondary hypertension, unspecified: Secondary | ICD-10-CM | POA: Diagnosis not present

## 2018-04-18 DIAGNOSIS — R51 Headache: Secondary | ICD-10-CM | POA: Diagnosis not present

## 2018-04-18 DIAGNOSIS — I5033 Acute on chronic diastolic (congestive) heart failure: Secondary | ICD-10-CM | POA: Diagnosis not present

## 2018-04-18 DIAGNOSIS — E1140 Type 2 diabetes mellitus with diabetic neuropathy, unspecified: Secondary | ICD-10-CM | POA: Diagnosis not present

## 2018-04-18 DIAGNOSIS — D509 Iron deficiency anemia, unspecified: Secondary | ICD-10-CM | POA: Diagnosis not present

## 2018-04-18 DIAGNOSIS — N186 End stage renal disease: Secondary | ICD-10-CM | POA: Diagnosis not present

## 2018-04-19 DIAGNOSIS — G610 Guillain-Barre syndrome: Secondary | ICD-10-CM | POA: Diagnosis not present

## 2018-04-19 DIAGNOSIS — J9610 Chronic respiratory failure, unspecified whether with hypoxia or hypercapnia: Secondary | ICD-10-CM | POA: Diagnosis not present

## 2018-04-21 DIAGNOSIS — N2581 Secondary hyperparathyroidism of renal origin: Secondary | ICD-10-CM | POA: Diagnosis not present

## 2018-04-21 DIAGNOSIS — D509 Iron deficiency anemia, unspecified: Secondary | ICD-10-CM | POA: Diagnosis not present

## 2018-04-21 DIAGNOSIS — D631 Anemia in chronic kidney disease: Secondary | ICD-10-CM | POA: Diagnosis not present

## 2018-04-21 DIAGNOSIS — R51 Headache: Secondary | ICD-10-CM | POA: Diagnosis not present

## 2018-04-21 DIAGNOSIS — N186 End stage renal disease: Secondary | ICD-10-CM | POA: Diagnosis not present

## 2018-04-21 DIAGNOSIS — D689 Coagulation defect, unspecified: Secondary | ICD-10-CM | POA: Diagnosis not present

## 2018-04-21 DIAGNOSIS — Z4931 Encounter for adequacy testing for hemodialysis: Secondary | ICD-10-CM | POA: Diagnosis not present

## 2018-04-21 DIAGNOSIS — I159 Secondary hypertension, unspecified: Secondary | ICD-10-CM | POA: Diagnosis not present

## 2018-04-22 DIAGNOSIS — Z01818 Encounter for other preprocedural examination: Secondary | ICD-10-CM | POA: Diagnosis not present

## 2018-04-22 DIAGNOSIS — N182 Chronic kidney disease, stage 2 (mild): Secondary | ICD-10-CM | POA: Diagnosis not present

## 2018-04-22 DIAGNOSIS — E1122 Type 2 diabetes mellitus with diabetic chronic kidney disease: Secondary | ICD-10-CM | POA: Diagnosis not present

## 2018-04-22 DIAGNOSIS — E1165 Type 2 diabetes mellitus with hyperglycemia: Secondary | ICD-10-CM | POA: Diagnosis not present

## 2018-04-22 DIAGNOSIS — N186 End stage renal disease: Secondary | ICD-10-CM | POA: Diagnosis not present

## 2018-04-22 DIAGNOSIS — Z992 Dependence on renal dialysis: Secondary | ICD-10-CM | POA: Diagnosis not present

## 2018-04-22 DIAGNOSIS — Z794 Long term (current) use of insulin: Secondary | ICD-10-CM | POA: Diagnosis not present

## 2018-04-23 DIAGNOSIS — D689 Coagulation defect, unspecified: Secondary | ICD-10-CM | POA: Diagnosis not present

## 2018-04-23 DIAGNOSIS — D631 Anemia in chronic kidney disease: Secondary | ICD-10-CM | POA: Diagnosis not present

## 2018-04-23 DIAGNOSIS — D509 Iron deficiency anemia, unspecified: Secondary | ICD-10-CM | POA: Diagnosis not present

## 2018-04-23 DIAGNOSIS — N186 End stage renal disease: Secondary | ICD-10-CM | POA: Diagnosis not present

## 2018-04-23 DIAGNOSIS — Z4931 Encounter for adequacy testing for hemodialysis: Secondary | ICD-10-CM | POA: Diagnosis not present

## 2018-04-23 DIAGNOSIS — R51 Headache: Secondary | ICD-10-CM | POA: Diagnosis not present

## 2018-04-23 DIAGNOSIS — I159 Secondary hypertension, unspecified: Secondary | ICD-10-CM | POA: Diagnosis not present

## 2018-04-23 DIAGNOSIS — N2581 Secondary hyperparathyroidism of renal origin: Secondary | ICD-10-CM | POA: Diagnosis not present

## 2018-04-25 DIAGNOSIS — D509 Iron deficiency anemia, unspecified: Secondary | ICD-10-CM | POA: Diagnosis not present

## 2018-04-25 DIAGNOSIS — N186 End stage renal disease: Secondary | ICD-10-CM | POA: Diagnosis not present

## 2018-04-25 DIAGNOSIS — I159 Secondary hypertension, unspecified: Secondary | ICD-10-CM | POA: Diagnosis not present

## 2018-04-25 DIAGNOSIS — R51 Headache: Secondary | ICD-10-CM | POA: Diagnosis not present

## 2018-04-25 DIAGNOSIS — D631 Anemia in chronic kidney disease: Secondary | ICD-10-CM | POA: Diagnosis not present

## 2018-04-25 DIAGNOSIS — N2581 Secondary hyperparathyroidism of renal origin: Secondary | ICD-10-CM | POA: Diagnosis not present

## 2018-04-25 DIAGNOSIS — Z4931 Encounter for adequacy testing for hemodialysis: Secondary | ICD-10-CM | POA: Diagnosis not present

## 2018-04-25 DIAGNOSIS — D689 Coagulation defect, unspecified: Secondary | ICD-10-CM | POA: Diagnosis not present

## 2018-04-28 DIAGNOSIS — D631 Anemia in chronic kidney disease: Secondary | ICD-10-CM | POA: Diagnosis not present

## 2018-04-28 DIAGNOSIS — D509 Iron deficiency anemia, unspecified: Secondary | ICD-10-CM | POA: Diagnosis not present

## 2018-04-28 DIAGNOSIS — Z4931 Encounter for adequacy testing for hemodialysis: Secondary | ICD-10-CM | POA: Diagnosis not present

## 2018-04-28 DIAGNOSIS — I159 Secondary hypertension, unspecified: Secondary | ICD-10-CM | POA: Diagnosis not present

## 2018-04-28 DIAGNOSIS — D689 Coagulation defect, unspecified: Secondary | ICD-10-CM | POA: Diagnosis not present

## 2018-04-28 DIAGNOSIS — N186 End stage renal disease: Secondary | ICD-10-CM | POA: Diagnosis not present

## 2018-04-28 DIAGNOSIS — R51 Headache: Secondary | ICD-10-CM | POA: Diagnosis not present

## 2018-04-28 DIAGNOSIS — N2581 Secondary hyperparathyroidism of renal origin: Secondary | ICD-10-CM | POA: Diagnosis not present

## 2018-04-30 DIAGNOSIS — R51 Headache: Secondary | ICD-10-CM | POA: Diagnosis not present

## 2018-04-30 DIAGNOSIS — I159 Secondary hypertension, unspecified: Secondary | ICD-10-CM | POA: Diagnosis not present

## 2018-04-30 DIAGNOSIS — D631 Anemia in chronic kidney disease: Secondary | ICD-10-CM | POA: Diagnosis not present

## 2018-04-30 DIAGNOSIS — D689 Coagulation defect, unspecified: Secondary | ICD-10-CM | POA: Diagnosis not present

## 2018-04-30 DIAGNOSIS — D509 Iron deficiency anemia, unspecified: Secondary | ICD-10-CM | POA: Diagnosis not present

## 2018-04-30 DIAGNOSIS — N186 End stage renal disease: Secondary | ICD-10-CM | POA: Diagnosis not present

## 2018-04-30 DIAGNOSIS — Z4931 Encounter for adequacy testing for hemodialysis: Secondary | ICD-10-CM | POA: Diagnosis not present

## 2018-04-30 DIAGNOSIS — N2581 Secondary hyperparathyroidism of renal origin: Secondary | ICD-10-CM | POA: Diagnosis not present

## 2018-05-02 DIAGNOSIS — R51 Headache: Secondary | ICD-10-CM | POA: Diagnosis not present

## 2018-05-02 DIAGNOSIS — D509 Iron deficiency anemia, unspecified: Secondary | ICD-10-CM | POA: Diagnosis not present

## 2018-05-02 DIAGNOSIS — D689 Coagulation defect, unspecified: Secondary | ICD-10-CM | POA: Diagnosis not present

## 2018-05-02 DIAGNOSIS — N186 End stage renal disease: Secondary | ICD-10-CM | POA: Diagnosis not present

## 2018-05-02 DIAGNOSIS — N2581 Secondary hyperparathyroidism of renal origin: Secondary | ICD-10-CM | POA: Diagnosis not present

## 2018-05-02 DIAGNOSIS — D631 Anemia in chronic kidney disease: Secondary | ICD-10-CM | POA: Diagnosis not present

## 2018-05-02 DIAGNOSIS — Z4931 Encounter for adequacy testing for hemodialysis: Secondary | ICD-10-CM | POA: Diagnosis not present

## 2018-05-02 DIAGNOSIS — I159 Secondary hypertension, unspecified: Secondary | ICD-10-CM | POA: Diagnosis not present

## 2018-05-05 DIAGNOSIS — D509 Iron deficiency anemia, unspecified: Secondary | ICD-10-CM | POA: Diagnosis not present

## 2018-05-05 DIAGNOSIS — D689 Coagulation defect, unspecified: Secondary | ICD-10-CM | POA: Diagnosis not present

## 2018-05-05 DIAGNOSIS — I159 Secondary hypertension, unspecified: Secondary | ICD-10-CM | POA: Diagnosis not present

## 2018-05-05 DIAGNOSIS — D631 Anemia in chronic kidney disease: Secondary | ICD-10-CM | POA: Diagnosis not present

## 2018-05-05 DIAGNOSIS — Z4931 Encounter for adequacy testing for hemodialysis: Secondary | ICD-10-CM | POA: Diagnosis not present

## 2018-05-05 DIAGNOSIS — N186 End stage renal disease: Secondary | ICD-10-CM | POA: Diagnosis not present

## 2018-05-05 DIAGNOSIS — R51 Headache: Secondary | ICD-10-CM | POA: Diagnosis not present

## 2018-05-05 DIAGNOSIS — N2581 Secondary hyperparathyroidism of renal origin: Secondary | ICD-10-CM | POA: Diagnosis not present

## 2018-05-07 DIAGNOSIS — Z4931 Encounter for adequacy testing for hemodialysis: Secondary | ICD-10-CM | POA: Diagnosis not present

## 2018-05-07 DIAGNOSIS — I159 Secondary hypertension, unspecified: Secondary | ICD-10-CM | POA: Diagnosis not present

## 2018-05-07 DIAGNOSIS — N186 End stage renal disease: Secondary | ICD-10-CM | POA: Diagnosis not present

## 2018-05-07 DIAGNOSIS — N2581 Secondary hyperparathyroidism of renal origin: Secondary | ICD-10-CM | POA: Diagnosis not present

## 2018-05-07 DIAGNOSIS — D689 Coagulation defect, unspecified: Secondary | ICD-10-CM | POA: Diagnosis not present

## 2018-05-07 DIAGNOSIS — D631 Anemia in chronic kidney disease: Secondary | ICD-10-CM | POA: Diagnosis not present

## 2018-05-07 DIAGNOSIS — D509 Iron deficiency anemia, unspecified: Secondary | ICD-10-CM | POA: Diagnosis not present

## 2018-05-07 DIAGNOSIS — R51 Headache: Secondary | ICD-10-CM | POA: Diagnosis not present

## 2018-05-09 DIAGNOSIS — I159 Secondary hypertension, unspecified: Secondary | ICD-10-CM | POA: Diagnosis not present

## 2018-05-09 DIAGNOSIS — N2581 Secondary hyperparathyroidism of renal origin: Secondary | ICD-10-CM | POA: Diagnosis not present

## 2018-05-09 DIAGNOSIS — D509 Iron deficiency anemia, unspecified: Secondary | ICD-10-CM | POA: Diagnosis not present

## 2018-05-09 DIAGNOSIS — R51 Headache: Secondary | ICD-10-CM | POA: Diagnosis not present

## 2018-05-09 DIAGNOSIS — D631 Anemia in chronic kidney disease: Secondary | ICD-10-CM | POA: Diagnosis not present

## 2018-05-09 DIAGNOSIS — Z4931 Encounter for adequacy testing for hemodialysis: Secondary | ICD-10-CM | POA: Diagnosis not present

## 2018-05-09 DIAGNOSIS — D689 Coagulation defect, unspecified: Secondary | ICD-10-CM | POA: Diagnosis not present

## 2018-05-09 DIAGNOSIS — N186 End stage renal disease: Secondary | ICD-10-CM | POA: Diagnosis not present

## 2018-05-12 DIAGNOSIS — R112 Nausea with vomiting, unspecified: Secondary | ICD-10-CM | POA: Diagnosis not present

## 2018-05-12 DIAGNOSIS — R509 Fever, unspecified: Secondary | ICD-10-CM | POA: Diagnosis not present

## 2018-05-12 DIAGNOSIS — R0602 Shortness of breath: Secondary | ICD-10-CM | POA: Diagnosis not present

## 2018-05-12 DIAGNOSIS — Z1383 Encounter for screening for respiratory disorder NEC: Secondary | ICD-10-CM | POA: Diagnosis not present

## 2018-05-13 DIAGNOSIS — N186 End stage renal disease: Secondary | ICD-10-CM | POA: Diagnosis not present

## 2018-05-13 DIAGNOSIS — E1122 Type 2 diabetes mellitus with diabetic chronic kidney disease: Secondary | ICD-10-CM | POA: Diagnosis not present

## 2018-05-13 DIAGNOSIS — Z992 Dependence on renal dialysis: Secondary | ICD-10-CM | POA: Diagnosis not present

## 2018-05-14 DIAGNOSIS — R51 Headache: Secondary | ICD-10-CM | POA: Diagnosis not present

## 2018-05-14 DIAGNOSIS — D689 Coagulation defect, unspecified: Secondary | ICD-10-CM | POA: Diagnosis not present

## 2018-05-14 DIAGNOSIS — N186 End stage renal disease: Secondary | ICD-10-CM | POA: Diagnosis not present

## 2018-05-14 DIAGNOSIS — D631 Anemia in chronic kidney disease: Secondary | ICD-10-CM | POA: Diagnosis not present

## 2018-05-14 DIAGNOSIS — N2581 Secondary hyperparathyroidism of renal origin: Secondary | ICD-10-CM | POA: Diagnosis not present

## 2018-05-14 DIAGNOSIS — D509 Iron deficiency anemia, unspecified: Secondary | ICD-10-CM | POA: Diagnosis not present

## 2018-05-14 DIAGNOSIS — R509 Fever, unspecified: Secondary | ICD-10-CM | POA: Diagnosis not present

## 2018-05-14 DIAGNOSIS — I159 Secondary hypertension, unspecified: Secondary | ICD-10-CM | POA: Diagnosis not present

## 2018-05-14 DIAGNOSIS — Z4931 Encounter for adequacy testing for hemodialysis: Secondary | ICD-10-CM | POA: Diagnosis not present

## 2018-05-16 DIAGNOSIS — I159 Secondary hypertension, unspecified: Secondary | ICD-10-CM | POA: Diagnosis not present

## 2018-05-16 DIAGNOSIS — R509 Fever, unspecified: Secondary | ICD-10-CM | POA: Diagnosis not present

## 2018-05-16 DIAGNOSIS — D631 Anemia in chronic kidney disease: Secondary | ICD-10-CM | POA: Diagnosis not present

## 2018-05-16 DIAGNOSIS — D509 Iron deficiency anemia, unspecified: Secondary | ICD-10-CM | POA: Diagnosis not present

## 2018-05-16 DIAGNOSIS — Z4931 Encounter for adequacy testing for hemodialysis: Secondary | ICD-10-CM | POA: Diagnosis not present

## 2018-05-16 DIAGNOSIS — R51 Headache: Secondary | ICD-10-CM | POA: Diagnosis not present

## 2018-05-16 DIAGNOSIS — N186 End stage renal disease: Secondary | ICD-10-CM | POA: Diagnosis not present

## 2018-05-16 DIAGNOSIS — D689 Coagulation defect, unspecified: Secondary | ICD-10-CM | POA: Diagnosis not present

## 2018-05-16 DIAGNOSIS — N2581 Secondary hyperparathyroidism of renal origin: Secondary | ICD-10-CM | POA: Diagnosis not present

## 2018-05-19 DIAGNOSIS — E1140 Type 2 diabetes mellitus with diabetic neuropathy, unspecified: Secondary | ICD-10-CM | POA: Diagnosis not present

## 2018-05-19 DIAGNOSIS — D509 Iron deficiency anemia, unspecified: Secondary | ICD-10-CM | POA: Diagnosis not present

## 2018-05-19 DIAGNOSIS — D689 Coagulation defect, unspecified: Secondary | ICD-10-CM | POA: Diagnosis not present

## 2018-05-19 DIAGNOSIS — Z4931 Encounter for adequacy testing for hemodialysis: Secondary | ICD-10-CM | POA: Diagnosis not present

## 2018-05-19 DIAGNOSIS — R062 Wheezing: Secondary | ICD-10-CM | POA: Diagnosis not present

## 2018-05-19 DIAGNOSIS — I159 Secondary hypertension, unspecified: Secondary | ICD-10-CM | POA: Diagnosis not present

## 2018-05-19 DIAGNOSIS — N186 End stage renal disease: Secondary | ICD-10-CM | POA: Diagnosis not present

## 2018-05-19 DIAGNOSIS — R509 Fever, unspecified: Secondary | ICD-10-CM | POA: Diagnosis not present

## 2018-05-19 DIAGNOSIS — R51 Headache: Secondary | ICD-10-CM | POA: Diagnosis not present

## 2018-05-19 DIAGNOSIS — N2581 Secondary hyperparathyroidism of renal origin: Secondary | ICD-10-CM | POA: Diagnosis not present

## 2018-05-19 DIAGNOSIS — I5033 Acute on chronic diastolic (congestive) heart failure: Secondary | ICD-10-CM | POA: Diagnosis not present

## 2018-05-19 DIAGNOSIS — D631 Anemia in chronic kidney disease: Secondary | ICD-10-CM | POA: Diagnosis not present

## 2018-05-20 DIAGNOSIS — G610 Guillain-Barre syndrome: Secondary | ICD-10-CM | POA: Diagnosis not present

## 2018-05-20 DIAGNOSIS — J9610 Chronic respiratory failure, unspecified whether with hypoxia or hypercapnia: Secondary | ICD-10-CM | POA: Diagnosis not present

## 2018-05-21 DIAGNOSIS — I159 Secondary hypertension, unspecified: Secondary | ICD-10-CM | POA: Diagnosis not present

## 2018-05-21 DIAGNOSIS — D689 Coagulation defect, unspecified: Secondary | ICD-10-CM | POA: Diagnosis not present

## 2018-05-21 DIAGNOSIS — N2581 Secondary hyperparathyroidism of renal origin: Secondary | ICD-10-CM | POA: Diagnosis not present

## 2018-05-21 DIAGNOSIS — N186 End stage renal disease: Secondary | ICD-10-CM | POA: Diagnosis not present

## 2018-05-21 DIAGNOSIS — D509 Iron deficiency anemia, unspecified: Secondary | ICD-10-CM | POA: Diagnosis not present

## 2018-05-21 DIAGNOSIS — R51 Headache: Secondary | ICD-10-CM | POA: Diagnosis not present

## 2018-05-21 DIAGNOSIS — Z4931 Encounter for adequacy testing for hemodialysis: Secondary | ICD-10-CM | POA: Diagnosis not present

## 2018-05-21 DIAGNOSIS — R509 Fever, unspecified: Secondary | ICD-10-CM | POA: Diagnosis not present

## 2018-05-21 DIAGNOSIS — D631 Anemia in chronic kidney disease: Secondary | ICD-10-CM | POA: Diagnosis not present

## 2018-05-23 DIAGNOSIS — D509 Iron deficiency anemia, unspecified: Secondary | ICD-10-CM | POA: Diagnosis not present

## 2018-05-23 DIAGNOSIS — N2581 Secondary hyperparathyroidism of renal origin: Secondary | ICD-10-CM | POA: Diagnosis not present

## 2018-05-23 DIAGNOSIS — N186 End stage renal disease: Secondary | ICD-10-CM | POA: Diagnosis not present

## 2018-05-23 DIAGNOSIS — D631 Anemia in chronic kidney disease: Secondary | ICD-10-CM | POA: Diagnosis not present

## 2018-05-23 DIAGNOSIS — D689 Coagulation defect, unspecified: Secondary | ICD-10-CM | POA: Diagnosis not present

## 2018-05-23 DIAGNOSIS — R51 Headache: Secondary | ICD-10-CM | POA: Diagnosis not present

## 2018-05-23 DIAGNOSIS — R509 Fever, unspecified: Secondary | ICD-10-CM | POA: Diagnosis not present

## 2018-05-23 DIAGNOSIS — I159 Secondary hypertension, unspecified: Secondary | ICD-10-CM | POA: Diagnosis not present

## 2018-05-23 DIAGNOSIS — Z4931 Encounter for adequacy testing for hemodialysis: Secondary | ICD-10-CM | POA: Diagnosis not present

## 2018-05-26 DIAGNOSIS — R51 Headache: Secondary | ICD-10-CM | POA: Diagnosis not present

## 2018-05-26 DIAGNOSIS — N2581 Secondary hyperparathyroidism of renal origin: Secondary | ICD-10-CM | POA: Diagnosis not present

## 2018-05-26 DIAGNOSIS — R509 Fever, unspecified: Secondary | ICD-10-CM | POA: Diagnosis not present

## 2018-05-26 DIAGNOSIS — D631 Anemia in chronic kidney disease: Secondary | ICD-10-CM | POA: Diagnosis not present

## 2018-05-26 DIAGNOSIS — Z4931 Encounter for adequacy testing for hemodialysis: Secondary | ICD-10-CM | POA: Diagnosis not present

## 2018-05-26 DIAGNOSIS — N186 End stage renal disease: Secondary | ICD-10-CM | POA: Diagnosis not present

## 2018-05-26 DIAGNOSIS — I159 Secondary hypertension, unspecified: Secondary | ICD-10-CM | POA: Diagnosis not present

## 2018-05-26 DIAGNOSIS — D509 Iron deficiency anemia, unspecified: Secondary | ICD-10-CM | POA: Diagnosis not present

## 2018-05-26 DIAGNOSIS — D689 Coagulation defect, unspecified: Secondary | ICD-10-CM | POA: Diagnosis not present

## 2018-05-28 DIAGNOSIS — I159 Secondary hypertension, unspecified: Secondary | ICD-10-CM | POA: Diagnosis not present

## 2018-05-28 DIAGNOSIS — D689 Coagulation defect, unspecified: Secondary | ICD-10-CM | POA: Diagnosis not present

## 2018-05-28 DIAGNOSIS — D631 Anemia in chronic kidney disease: Secondary | ICD-10-CM | POA: Diagnosis not present

## 2018-05-28 DIAGNOSIS — N186 End stage renal disease: Secondary | ICD-10-CM | POA: Diagnosis not present

## 2018-05-28 DIAGNOSIS — R509 Fever, unspecified: Secondary | ICD-10-CM | POA: Diagnosis not present

## 2018-05-28 DIAGNOSIS — N2581 Secondary hyperparathyroidism of renal origin: Secondary | ICD-10-CM | POA: Diagnosis not present

## 2018-05-28 DIAGNOSIS — Z4931 Encounter for adequacy testing for hemodialysis: Secondary | ICD-10-CM | POA: Diagnosis not present

## 2018-05-28 DIAGNOSIS — R51 Headache: Secondary | ICD-10-CM | POA: Diagnosis not present

## 2018-05-28 DIAGNOSIS — D509 Iron deficiency anemia, unspecified: Secondary | ICD-10-CM | POA: Diagnosis not present

## 2018-05-30 DIAGNOSIS — N2581 Secondary hyperparathyroidism of renal origin: Secondary | ICD-10-CM | POA: Diagnosis not present

## 2018-05-30 DIAGNOSIS — R509 Fever, unspecified: Secondary | ICD-10-CM | POA: Diagnosis not present

## 2018-05-30 DIAGNOSIS — R51 Headache: Secondary | ICD-10-CM | POA: Diagnosis not present

## 2018-05-30 DIAGNOSIS — Z4931 Encounter for adequacy testing for hemodialysis: Secondary | ICD-10-CM | POA: Diagnosis not present

## 2018-05-30 DIAGNOSIS — D631 Anemia in chronic kidney disease: Secondary | ICD-10-CM | POA: Diagnosis not present

## 2018-05-30 DIAGNOSIS — N186 End stage renal disease: Secondary | ICD-10-CM | POA: Diagnosis not present

## 2018-05-30 DIAGNOSIS — D689 Coagulation defect, unspecified: Secondary | ICD-10-CM | POA: Diagnosis not present

## 2018-05-30 DIAGNOSIS — D509 Iron deficiency anemia, unspecified: Secondary | ICD-10-CM | POA: Diagnosis not present

## 2018-05-30 DIAGNOSIS — I159 Secondary hypertension, unspecified: Secondary | ICD-10-CM | POA: Diagnosis not present

## 2018-06-02 DIAGNOSIS — Z4931 Encounter for adequacy testing for hemodialysis: Secondary | ICD-10-CM | POA: Diagnosis not present

## 2018-06-02 DIAGNOSIS — R51 Headache: Secondary | ICD-10-CM | POA: Diagnosis not present

## 2018-06-02 DIAGNOSIS — I159 Secondary hypertension, unspecified: Secondary | ICD-10-CM | POA: Diagnosis not present

## 2018-06-02 DIAGNOSIS — N186 End stage renal disease: Secondary | ICD-10-CM | POA: Diagnosis not present

## 2018-06-02 DIAGNOSIS — D689 Coagulation defect, unspecified: Secondary | ICD-10-CM | POA: Diagnosis not present

## 2018-06-02 DIAGNOSIS — D631 Anemia in chronic kidney disease: Secondary | ICD-10-CM | POA: Diagnosis not present

## 2018-06-02 DIAGNOSIS — D509 Iron deficiency anemia, unspecified: Secondary | ICD-10-CM | POA: Diagnosis not present

## 2018-06-02 DIAGNOSIS — N2581 Secondary hyperparathyroidism of renal origin: Secondary | ICD-10-CM | POA: Diagnosis not present

## 2018-06-02 DIAGNOSIS — R509 Fever, unspecified: Secondary | ICD-10-CM | POA: Diagnosis not present

## 2018-06-04 DIAGNOSIS — N186 End stage renal disease: Secondary | ICD-10-CM | POA: Diagnosis not present

## 2018-06-04 DIAGNOSIS — R509 Fever, unspecified: Secondary | ICD-10-CM | POA: Diagnosis not present

## 2018-06-04 DIAGNOSIS — N2581 Secondary hyperparathyroidism of renal origin: Secondary | ICD-10-CM | POA: Diagnosis not present

## 2018-06-04 DIAGNOSIS — D631 Anemia in chronic kidney disease: Secondary | ICD-10-CM | POA: Diagnosis not present

## 2018-06-04 DIAGNOSIS — Z4931 Encounter for adequacy testing for hemodialysis: Secondary | ICD-10-CM | POA: Diagnosis not present

## 2018-06-04 DIAGNOSIS — I159 Secondary hypertension, unspecified: Secondary | ICD-10-CM | POA: Diagnosis not present

## 2018-06-04 DIAGNOSIS — D689 Coagulation defect, unspecified: Secondary | ICD-10-CM | POA: Diagnosis not present

## 2018-06-04 DIAGNOSIS — R51 Headache: Secondary | ICD-10-CM | POA: Diagnosis not present

## 2018-06-04 DIAGNOSIS — D509 Iron deficiency anemia, unspecified: Secondary | ICD-10-CM | POA: Diagnosis not present

## 2018-06-06 DIAGNOSIS — D631 Anemia in chronic kidney disease: Secondary | ICD-10-CM | POA: Diagnosis not present

## 2018-06-06 DIAGNOSIS — R51 Headache: Secondary | ICD-10-CM | POA: Diagnosis not present

## 2018-06-06 DIAGNOSIS — N186 End stage renal disease: Secondary | ICD-10-CM | POA: Diagnosis not present

## 2018-06-06 DIAGNOSIS — Z4931 Encounter for adequacy testing for hemodialysis: Secondary | ICD-10-CM | POA: Diagnosis not present

## 2018-06-06 DIAGNOSIS — R509 Fever, unspecified: Secondary | ICD-10-CM | POA: Diagnosis not present

## 2018-06-06 DIAGNOSIS — D509 Iron deficiency anemia, unspecified: Secondary | ICD-10-CM | POA: Diagnosis not present

## 2018-06-06 DIAGNOSIS — D689 Coagulation defect, unspecified: Secondary | ICD-10-CM | POA: Diagnosis not present

## 2018-06-06 DIAGNOSIS — N2581 Secondary hyperparathyroidism of renal origin: Secondary | ICD-10-CM | POA: Diagnosis not present

## 2018-06-06 DIAGNOSIS — I159 Secondary hypertension, unspecified: Secondary | ICD-10-CM | POA: Diagnosis not present

## 2018-06-09 DIAGNOSIS — N2581 Secondary hyperparathyroidism of renal origin: Secondary | ICD-10-CM | POA: Diagnosis not present

## 2018-06-09 DIAGNOSIS — D509 Iron deficiency anemia, unspecified: Secondary | ICD-10-CM | POA: Diagnosis not present

## 2018-06-09 DIAGNOSIS — Z4931 Encounter for adequacy testing for hemodialysis: Secondary | ICD-10-CM | POA: Diagnosis not present

## 2018-06-09 DIAGNOSIS — N186 End stage renal disease: Secondary | ICD-10-CM | POA: Diagnosis not present

## 2018-06-09 DIAGNOSIS — R51 Headache: Secondary | ICD-10-CM | POA: Diagnosis not present

## 2018-06-09 DIAGNOSIS — I159 Secondary hypertension, unspecified: Secondary | ICD-10-CM | POA: Diagnosis not present

## 2018-06-09 DIAGNOSIS — D631 Anemia in chronic kidney disease: Secondary | ICD-10-CM | POA: Diagnosis not present

## 2018-06-09 DIAGNOSIS — D689 Coagulation defect, unspecified: Secondary | ICD-10-CM | POA: Diagnosis not present

## 2018-06-09 DIAGNOSIS — R509 Fever, unspecified: Secondary | ICD-10-CM | POA: Diagnosis not present

## 2018-06-11 DIAGNOSIS — R51 Headache: Secondary | ICD-10-CM | POA: Diagnosis not present

## 2018-06-11 DIAGNOSIS — N186 End stage renal disease: Secondary | ICD-10-CM | POA: Diagnosis not present

## 2018-06-11 DIAGNOSIS — R509 Fever, unspecified: Secondary | ICD-10-CM | POA: Diagnosis not present

## 2018-06-11 DIAGNOSIS — N2581 Secondary hyperparathyroidism of renal origin: Secondary | ICD-10-CM | POA: Diagnosis not present

## 2018-06-11 DIAGNOSIS — Z4931 Encounter for adequacy testing for hemodialysis: Secondary | ICD-10-CM | POA: Diagnosis not present

## 2018-06-11 DIAGNOSIS — D689 Coagulation defect, unspecified: Secondary | ICD-10-CM | POA: Diagnosis not present

## 2018-06-11 DIAGNOSIS — D509 Iron deficiency anemia, unspecified: Secondary | ICD-10-CM | POA: Diagnosis not present

## 2018-06-11 DIAGNOSIS — I159 Secondary hypertension, unspecified: Secondary | ICD-10-CM | POA: Diagnosis not present

## 2018-06-11 DIAGNOSIS — D631 Anemia in chronic kidney disease: Secondary | ICD-10-CM | POA: Diagnosis not present

## 2018-06-12 DIAGNOSIS — N186 End stage renal disease: Secondary | ICD-10-CM | POA: Diagnosis not present

## 2018-06-12 DIAGNOSIS — E1122 Type 2 diabetes mellitus with diabetic chronic kidney disease: Secondary | ICD-10-CM | POA: Diagnosis not present

## 2018-06-12 DIAGNOSIS — Z992 Dependence on renal dialysis: Secondary | ICD-10-CM | POA: Diagnosis not present

## 2018-06-13 DIAGNOSIS — D689 Coagulation defect, unspecified: Secondary | ICD-10-CM | POA: Diagnosis not present

## 2018-06-13 DIAGNOSIS — D509 Iron deficiency anemia, unspecified: Secondary | ICD-10-CM | POA: Diagnosis not present

## 2018-06-13 DIAGNOSIS — R110 Nausea: Secondary | ICD-10-CM | POA: Diagnosis not present

## 2018-06-13 DIAGNOSIS — D631 Anemia in chronic kidney disease: Secondary | ICD-10-CM | POA: Diagnosis not present

## 2018-06-13 DIAGNOSIS — N2581 Secondary hyperparathyroidism of renal origin: Secondary | ICD-10-CM | POA: Diagnosis not present

## 2018-06-13 DIAGNOSIS — Z4931 Encounter for adequacy testing for hemodialysis: Secondary | ICD-10-CM | POA: Diagnosis not present

## 2018-06-13 DIAGNOSIS — N186 End stage renal disease: Secondary | ICD-10-CM | POA: Diagnosis not present

## 2018-06-16 DIAGNOSIS — D509 Iron deficiency anemia, unspecified: Secondary | ICD-10-CM | POA: Diagnosis not present

## 2018-06-16 DIAGNOSIS — R110 Nausea: Secondary | ICD-10-CM | POA: Diagnosis not present

## 2018-06-16 DIAGNOSIS — D631 Anemia in chronic kidney disease: Secondary | ICD-10-CM | POA: Diagnosis not present

## 2018-06-16 DIAGNOSIS — N2581 Secondary hyperparathyroidism of renal origin: Secondary | ICD-10-CM | POA: Diagnosis not present

## 2018-06-16 DIAGNOSIS — D689 Coagulation defect, unspecified: Secondary | ICD-10-CM | POA: Diagnosis not present

## 2018-06-16 DIAGNOSIS — Z4931 Encounter for adequacy testing for hemodialysis: Secondary | ICD-10-CM | POA: Diagnosis not present

## 2018-06-16 DIAGNOSIS — N186 End stage renal disease: Secondary | ICD-10-CM | POA: Diagnosis not present

## 2018-06-18 DIAGNOSIS — D631 Anemia in chronic kidney disease: Secondary | ICD-10-CM | POA: Diagnosis not present

## 2018-06-18 DIAGNOSIS — R110 Nausea: Secondary | ICD-10-CM | POA: Diagnosis not present

## 2018-06-18 DIAGNOSIS — D509 Iron deficiency anemia, unspecified: Secondary | ICD-10-CM | POA: Diagnosis not present

## 2018-06-18 DIAGNOSIS — N186 End stage renal disease: Secondary | ICD-10-CM | POA: Diagnosis not present

## 2018-06-18 DIAGNOSIS — I5033 Acute on chronic diastolic (congestive) heart failure: Secondary | ICD-10-CM | POA: Diagnosis not present

## 2018-06-18 DIAGNOSIS — D689 Coagulation defect, unspecified: Secondary | ICD-10-CM | POA: Diagnosis not present

## 2018-06-18 DIAGNOSIS — Z4931 Encounter for adequacy testing for hemodialysis: Secondary | ICD-10-CM | POA: Diagnosis not present

## 2018-06-18 DIAGNOSIS — E1140 Type 2 diabetes mellitus with diabetic neuropathy, unspecified: Secondary | ICD-10-CM | POA: Diagnosis not present

## 2018-06-18 DIAGNOSIS — N2581 Secondary hyperparathyroidism of renal origin: Secondary | ICD-10-CM | POA: Diagnosis not present

## 2018-06-18 DIAGNOSIS — R062 Wheezing: Secondary | ICD-10-CM | POA: Diagnosis not present

## 2018-06-19 DIAGNOSIS — G610 Guillain-Barre syndrome: Secondary | ICD-10-CM | POA: Diagnosis not present

## 2018-06-19 DIAGNOSIS — J9610 Chronic respiratory failure, unspecified whether with hypoxia or hypercapnia: Secondary | ICD-10-CM | POA: Diagnosis not present

## 2018-06-20 DIAGNOSIS — Z4931 Encounter for adequacy testing for hemodialysis: Secondary | ICD-10-CM | POA: Diagnosis not present

## 2018-06-20 DIAGNOSIS — N186 End stage renal disease: Secondary | ICD-10-CM | POA: Diagnosis not present

## 2018-06-20 DIAGNOSIS — D689 Coagulation defect, unspecified: Secondary | ICD-10-CM | POA: Diagnosis not present

## 2018-06-20 DIAGNOSIS — D509 Iron deficiency anemia, unspecified: Secondary | ICD-10-CM | POA: Diagnosis not present

## 2018-06-20 DIAGNOSIS — D631 Anemia in chronic kidney disease: Secondary | ICD-10-CM | POA: Diagnosis not present

## 2018-06-20 DIAGNOSIS — N2581 Secondary hyperparathyroidism of renal origin: Secondary | ICD-10-CM | POA: Diagnosis not present

## 2018-06-20 DIAGNOSIS — R110 Nausea: Secondary | ICD-10-CM | POA: Diagnosis not present

## 2018-06-23 DIAGNOSIS — N2581 Secondary hyperparathyroidism of renal origin: Secondary | ICD-10-CM | POA: Diagnosis not present

## 2018-06-23 DIAGNOSIS — D509 Iron deficiency anemia, unspecified: Secondary | ICD-10-CM | POA: Diagnosis not present

## 2018-06-23 DIAGNOSIS — D689 Coagulation defect, unspecified: Secondary | ICD-10-CM | POA: Diagnosis not present

## 2018-06-23 DIAGNOSIS — R110 Nausea: Secondary | ICD-10-CM | POA: Diagnosis not present

## 2018-06-23 DIAGNOSIS — D631 Anemia in chronic kidney disease: Secondary | ICD-10-CM | POA: Diagnosis not present

## 2018-06-23 DIAGNOSIS — N186 End stage renal disease: Secondary | ICD-10-CM | POA: Diagnosis not present

## 2018-06-23 DIAGNOSIS — Z4931 Encounter for adequacy testing for hemodialysis: Secondary | ICD-10-CM | POA: Diagnosis not present

## 2018-06-25 DIAGNOSIS — E1122 Type 2 diabetes mellitus with diabetic chronic kidney disease: Secondary | ICD-10-CM | POA: Diagnosis not present

## 2018-06-25 DIAGNOSIS — D509 Iron deficiency anemia, unspecified: Secondary | ICD-10-CM | POA: Diagnosis not present

## 2018-06-25 DIAGNOSIS — D689 Coagulation defect, unspecified: Secondary | ICD-10-CM | POA: Diagnosis not present

## 2018-06-25 DIAGNOSIS — D631 Anemia in chronic kidney disease: Secondary | ICD-10-CM | POA: Diagnosis not present

## 2018-06-25 DIAGNOSIS — Z4931 Encounter for adequacy testing for hemodialysis: Secondary | ICD-10-CM | POA: Diagnosis not present

## 2018-06-25 DIAGNOSIS — N186 End stage renal disease: Secondary | ICD-10-CM | POA: Diagnosis not present

## 2018-06-25 DIAGNOSIS — N2581 Secondary hyperparathyroidism of renal origin: Secondary | ICD-10-CM | POA: Diagnosis not present

## 2018-06-25 DIAGNOSIS — R110 Nausea: Secondary | ICD-10-CM | POA: Diagnosis not present

## 2018-06-27 DIAGNOSIS — D631 Anemia in chronic kidney disease: Secondary | ICD-10-CM | POA: Diagnosis not present

## 2018-06-27 DIAGNOSIS — N186 End stage renal disease: Secondary | ICD-10-CM | POA: Diagnosis not present

## 2018-06-27 DIAGNOSIS — N2581 Secondary hyperparathyroidism of renal origin: Secondary | ICD-10-CM | POA: Diagnosis not present

## 2018-06-27 DIAGNOSIS — D509 Iron deficiency anemia, unspecified: Secondary | ICD-10-CM | POA: Diagnosis not present

## 2018-06-27 DIAGNOSIS — D689 Coagulation defect, unspecified: Secondary | ICD-10-CM | POA: Diagnosis not present

## 2018-06-27 DIAGNOSIS — Z4931 Encounter for adequacy testing for hemodialysis: Secondary | ICD-10-CM | POA: Diagnosis not present

## 2018-06-27 DIAGNOSIS — R110 Nausea: Secondary | ICD-10-CM | POA: Diagnosis not present

## 2018-06-30 DIAGNOSIS — Z4931 Encounter for adequacy testing for hemodialysis: Secondary | ICD-10-CM | POA: Diagnosis not present

## 2018-06-30 DIAGNOSIS — D509 Iron deficiency anemia, unspecified: Secondary | ICD-10-CM | POA: Diagnosis not present

## 2018-06-30 DIAGNOSIS — D689 Coagulation defect, unspecified: Secondary | ICD-10-CM | POA: Diagnosis not present

## 2018-06-30 DIAGNOSIS — N2581 Secondary hyperparathyroidism of renal origin: Secondary | ICD-10-CM | POA: Diagnosis not present

## 2018-06-30 DIAGNOSIS — R110 Nausea: Secondary | ICD-10-CM | POA: Diagnosis not present

## 2018-06-30 DIAGNOSIS — N186 End stage renal disease: Secondary | ICD-10-CM | POA: Diagnosis not present

## 2018-06-30 DIAGNOSIS — D631 Anemia in chronic kidney disease: Secondary | ICD-10-CM | POA: Diagnosis not present

## 2018-07-02 DIAGNOSIS — N186 End stage renal disease: Secondary | ICD-10-CM | POA: Diagnosis not present

## 2018-07-02 DIAGNOSIS — N2581 Secondary hyperparathyroidism of renal origin: Secondary | ICD-10-CM | POA: Diagnosis not present

## 2018-07-02 DIAGNOSIS — D689 Coagulation defect, unspecified: Secondary | ICD-10-CM | POA: Diagnosis not present

## 2018-07-02 DIAGNOSIS — D509 Iron deficiency anemia, unspecified: Secondary | ICD-10-CM | POA: Diagnosis not present

## 2018-07-02 DIAGNOSIS — D631 Anemia in chronic kidney disease: Secondary | ICD-10-CM | POA: Diagnosis not present

## 2018-07-02 DIAGNOSIS — R110 Nausea: Secondary | ICD-10-CM | POA: Diagnosis not present

## 2018-07-02 DIAGNOSIS — Z4931 Encounter for adequacy testing for hemodialysis: Secondary | ICD-10-CM | POA: Diagnosis not present

## 2018-07-04 DIAGNOSIS — Z4931 Encounter for adequacy testing for hemodialysis: Secondary | ICD-10-CM | POA: Diagnosis not present

## 2018-07-04 DIAGNOSIS — R110 Nausea: Secondary | ICD-10-CM | POA: Diagnosis not present

## 2018-07-04 DIAGNOSIS — N186 End stage renal disease: Secondary | ICD-10-CM | POA: Diagnosis not present

## 2018-07-04 DIAGNOSIS — N2581 Secondary hyperparathyroidism of renal origin: Secondary | ICD-10-CM | POA: Diagnosis not present

## 2018-07-04 DIAGNOSIS — D689 Coagulation defect, unspecified: Secondary | ICD-10-CM | POA: Diagnosis not present

## 2018-07-04 DIAGNOSIS — D509 Iron deficiency anemia, unspecified: Secondary | ICD-10-CM | POA: Diagnosis not present

## 2018-07-04 DIAGNOSIS — D631 Anemia in chronic kidney disease: Secondary | ICD-10-CM | POA: Diagnosis not present

## 2018-07-07 DIAGNOSIS — N186 End stage renal disease: Secondary | ICD-10-CM | POA: Diagnosis not present

## 2018-07-07 DIAGNOSIS — N2581 Secondary hyperparathyroidism of renal origin: Secondary | ICD-10-CM | POA: Diagnosis not present

## 2018-07-07 DIAGNOSIS — D509 Iron deficiency anemia, unspecified: Secondary | ICD-10-CM | POA: Diagnosis not present

## 2018-07-07 DIAGNOSIS — Z4931 Encounter for adequacy testing for hemodialysis: Secondary | ICD-10-CM | POA: Diagnosis not present

## 2018-07-07 DIAGNOSIS — D631 Anemia in chronic kidney disease: Secondary | ICD-10-CM | POA: Diagnosis not present

## 2018-07-07 DIAGNOSIS — D689 Coagulation defect, unspecified: Secondary | ICD-10-CM | POA: Diagnosis not present

## 2018-07-07 DIAGNOSIS — R110 Nausea: Secondary | ICD-10-CM | POA: Diagnosis not present

## 2018-07-09 DIAGNOSIS — N2581 Secondary hyperparathyroidism of renal origin: Secondary | ICD-10-CM | POA: Diagnosis not present

## 2018-07-09 DIAGNOSIS — Z4931 Encounter for adequacy testing for hemodialysis: Secondary | ICD-10-CM | POA: Diagnosis not present

## 2018-07-09 DIAGNOSIS — R110 Nausea: Secondary | ICD-10-CM | POA: Diagnosis not present

## 2018-07-09 DIAGNOSIS — D689 Coagulation defect, unspecified: Secondary | ICD-10-CM | POA: Diagnosis not present

## 2018-07-09 DIAGNOSIS — D631 Anemia in chronic kidney disease: Secondary | ICD-10-CM | POA: Diagnosis not present

## 2018-07-09 DIAGNOSIS — D509 Iron deficiency anemia, unspecified: Secondary | ICD-10-CM | POA: Diagnosis not present

## 2018-07-09 DIAGNOSIS — N186 End stage renal disease: Secondary | ICD-10-CM | POA: Diagnosis not present

## 2018-07-11 DIAGNOSIS — R110 Nausea: Secondary | ICD-10-CM | POA: Diagnosis not present

## 2018-07-11 DIAGNOSIS — Z4931 Encounter for adequacy testing for hemodialysis: Secondary | ICD-10-CM | POA: Diagnosis not present

## 2018-07-11 DIAGNOSIS — D631 Anemia in chronic kidney disease: Secondary | ICD-10-CM | POA: Diagnosis not present

## 2018-07-11 DIAGNOSIS — D689 Coagulation defect, unspecified: Secondary | ICD-10-CM | POA: Diagnosis not present

## 2018-07-11 DIAGNOSIS — D509 Iron deficiency anemia, unspecified: Secondary | ICD-10-CM | POA: Diagnosis not present

## 2018-07-11 DIAGNOSIS — N2581 Secondary hyperparathyroidism of renal origin: Secondary | ICD-10-CM | POA: Diagnosis not present

## 2018-07-11 DIAGNOSIS — N186 End stage renal disease: Secondary | ICD-10-CM | POA: Diagnosis not present

## 2018-07-13 DIAGNOSIS — N186 End stage renal disease: Secondary | ICD-10-CM | POA: Diagnosis not present

## 2018-07-13 DIAGNOSIS — E1122 Type 2 diabetes mellitus with diabetic chronic kidney disease: Secondary | ICD-10-CM | POA: Diagnosis not present

## 2018-07-13 DIAGNOSIS — Z992 Dependence on renal dialysis: Secondary | ICD-10-CM | POA: Diagnosis not present

## 2018-07-14 DIAGNOSIS — D689 Coagulation defect, unspecified: Secondary | ICD-10-CM | POA: Diagnosis not present

## 2018-07-14 DIAGNOSIS — N2581 Secondary hyperparathyroidism of renal origin: Secondary | ICD-10-CM | POA: Diagnosis not present

## 2018-07-14 DIAGNOSIS — N186 End stage renal disease: Secondary | ICD-10-CM | POA: Diagnosis not present

## 2018-07-14 DIAGNOSIS — R197 Diarrhea, unspecified: Secondary | ICD-10-CM | POA: Diagnosis not present

## 2018-07-14 DIAGNOSIS — D631 Anemia in chronic kidney disease: Secondary | ICD-10-CM | POA: Diagnosis not present

## 2018-07-14 DIAGNOSIS — I159 Secondary hypertension, unspecified: Secondary | ICD-10-CM | POA: Diagnosis not present

## 2018-07-14 DIAGNOSIS — Z4931 Encounter for adequacy testing for hemodialysis: Secondary | ICD-10-CM | POA: Diagnosis not present

## 2018-07-14 DIAGNOSIS — R110 Nausea: Secondary | ICD-10-CM | POA: Diagnosis not present

## 2018-07-14 DIAGNOSIS — R51 Headache: Secondary | ICD-10-CM | POA: Diagnosis not present

## 2018-07-16 DIAGNOSIS — N2581 Secondary hyperparathyroidism of renal origin: Secondary | ICD-10-CM | POA: Diagnosis not present

## 2018-07-16 DIAGNOSIS — R51 Headache: Secondary | ICD-10-CM | POA: Diagnosis not present

## 2018-07-16 DIAGNOSIS — R197 Diarrhea, unspecified: Secondary | ICD-10-CM | POA: Diagnosis not present

## 2018-07-16 DIAGNOSIS — I159 Secondary hypertension, unspecified: Secondary | ICD-10-CM | POA: Diagnosis not present

## 2018-07-16 DIAGNOSIS — Z4931 Encounter for adequacy testing for hemodialysis: Secondary | ICD-10-CM | POA: Diagnosis not present

## 2018-07-16 DIAGNOSIS — R110 Nausea: Secondary | ICD-10-CM | POA: Diagnosis not present

## 2018-07-16 DIAGNOSIS — D689 Coagulation defect, unspecified: Secondary | ICD-10-CM | POA: Diagnosis not present

## 2018-07-16 DIAGNOSIS — N186 End stage renal disease: Secondary | ICD-10-CM | POA: Diagnosis not present

## 2018-07-16 DIAGNOSIS — D631 Anemia in chronic kidney disease: Secondary | ICD-10-CM | POA: Diagnosis not present

## 2018-07-18 DIAGNOSIS — R197 Diarrhea, unspecified: Secondary | ICD-10-CM | POA: Diagnosis not present

## 2018-07-18 DIAGNOSIS — D689 Coagulation defect, unspecified: Secondary | ICD-10-CM | POA: Diagnosis not present

## 2018-07-18 DIAGNOSIS — R51 Headache: Secondary | ICD-10-CM | POA: Diagnosis not present

## 2018-07-18 DIAGNOSIS — Z4931 Encounter for adequacy testing for hemodialysis: Secondary | ICD-10-CM | POA: Diagnosis not present

## 2018-07-18 DIAGNOSIS — D631 Anemia in chronic kidney disease: Secondary | ICD-10-CM | POA: Diagnosis not present

## 2018-07-18 DIAGNOSIS — I159 Secondary hypertension, unspecified: Secondary | ICD-10-CM | POA: Diagnosis not present

## 2018-07-18 DIAGNOSIS — R110 Nausea: Secondary | ICD-10-CM | POA: Diagnosis not present

## 2018-07-18 DIAGNOSIS — N186 End stage renal disease: Secondary | ICD-10-CM | POA: Diagnosis not present

## 2018-07-18 DIAGNOSIS — N2581 Secondary hyperparathyroidism of renal origin: Secondary | ICD-10-CM | POA: Diagnosis not present

## 2018-07-19 DIAGNOSIS — R062 Wheezing: Secondary | ICD-10-CM | POA: Diagnosis not present

## 2018-07-19 DIAGNOSIS — I5033 Acute on chronic diastolic (congestive) heart failure: Secondary | ICD-10-CM | POA: Diagnosis not present

## 2018-07-19 DIAGNOSIS — E1140 Type 2 diabetes mellitus with diabetic neuropathy, unspecified: Secondary | ICD-10-CM | POA: Diagnosis not present

## 2018-07-20 DIAGNOSIS — J9610 Chronic respiratory failure, unspecified whether with hypoxia or hypercapnia: Secondary | ICD-10-CM | POA: Diagnosis not present

## 2018-07-20 DIAGNOSIS — G610 Guillain-Barre syndrome: Secondary | ICD-10-CM | POA: Diagnosis not present

## 2018-07-21 DIAGNOSIS — R51 Headache: Secondary | ICD-10-CM | POA: Diagnosis not present

## 2018-07-21 DIAGNOSIS — I159 Secondary hypertension, unspecified: Secondary | ICD-10-CM | POA: Diagnosis not present

## 2018-07-21 DIAGNOSIS — D689 Coagulation defect, unspecified: Secondary | ICD-10-CM | POA: Diagnosis not present

## 2018-07-21 DIAGNOSIS — D631 Anemia in chronic kidney disease: Secondary | ICD-10-CM | POA: Diagnosis not present

## 2018-07-21 DIAGNOSIS — Z4931 Encounter for adequacy testing for hemodialysis: Secondary | ICD-10-CM | POA: Diagnosis not present

## 2018-07-21 DIAGNOSIS — N186 End stage renal disease: Secondary | ICD-10-CM | POA: Diagnosis not present

## 2018-07-21 DIAGNOSIS — R110 Nausea: Secondary | ICD-10-CM | POA: Diagnosis not present

## 2018-07-21 DIAGNOSIS — R197 Diarrhea, unspecified: Secondary | ICD-10-CM | POA: Diagnosis not present

## 2018-07-21 DIAGNOSIS — N2581 Secondary hyperparathyroidism of renal origin: Secondary | ICD-10-CM | POA: Diagnosis not present

## 2018-07-23 DIAGNOSIS — R197 Diarrhea, unspecified: Secondary | ICD-10-CM | POA: Diagnosis not present

## 2018-07-23 DIAGNOSIS — I159 Secondary hypertension, unspecified: Secondary | ICD-10-CM | POA: Diagnosis not present

## 2018-07-23 DIAGNOSIS — N2581 Secondary hyperparathyroidism of renal origin: Secondary | ICD-10-CM | POA: Diagnosis not present

## 2018-07-23 DIAGNOSIS — D631 Anemia in chronic kidney disease: Secondary | ICD-10-CM | POA: Diagnosis not present

## 2018-07-23 DIAGNOSIS — R51 Headache: Secondary | ICD-10-CM | POA: Diagnosis not present

## 2018-07-23 DIAGNOSIS — D689 Coagulation defect, unspecified: Secondary | ICD-10-CM | POA: Diagnosis not present

## 2018-07-23 DIAGNOSIS — N186 End stage renal disease: Secondary | ICD-10-CM | POA: Diagnosis not present

## 2018-07-23 DIAGNOSIS — R110 Nausea: Secondary | ICD-10-CM | POA: Diagnosis not present

## 2018-07-23 DIAGNOSIS — Z4931 Encounter for adequacy testing for hemodialysis: Secondary | ICD-10-CM | POA: Diagnosis not present

## 2018-07-25 DIAGNOSIS — N189 Chronic kidney disease, unspecified: Secondary | ICD-10-CM | POA: Diagnosis not present

## 2018-07-25 DIAGNOSIS — E1122 Type 2 diabetes mellitus with diabetic chronic kidney disease: Secondary | ICD-10-CM | POA: Diagnosis not present

## 2018-07-25 DIAGNOSIS — I2510 Atherosclerotic heart disease of native coronary artery without angina pectoris: Secondary | ICD-10-CM | POA: Diagnosis not present

## 2018-07-25 DIAGNOSIS — J45909 Unspecified asthma, uncomplicated: Secondary | ICD-10-CM | POA: Diagnosis not present

## 2018-07-25 DIAGNOSIS — I119 Hypertensive heart disease without heart failure: Secondary | ICD-10-CM | POA: Diagnosis not present

## 2018-07-25 DIAGNOSIS — R0602 Shortness of breath: Secondary | ICD-10-CM | POA: Diagnosis not present

## 2018-07-25 DIAGNOSIS — M791 Myalgia, unspecified site: Secondary | ICD-10-CM | POA: Diagnosis not present

## 2018-07-25 DIAGNOSIS — Z87891 Personal history of nicotine dependence: Secondary | ICD-10-CM | POA: Diagnosis not present

## 2018-07-25 DIAGNOSIS — R5383 Other fatigue: Secondary | ICD-10-CM | POA: Diagnosis not present

## 2018-07-26 DIAGNOSIS — R197 Diarrhea, unspecified: Secondary | ICD-10-CM | POA: Diagnosis not present

## 2018-07-26 DIAGNOSIS — N186 End stage renal disease: Secondary | ICD-10-CM | POA: Diagnosis not present

## 2018-07-26 DIAGNOSIS — N2581 Secondary hyperparathyroidism of renal origin: Secondary | ICD-10-CM | POA: Diagnosis not present

## 2018-07-26 DIAGNOSIS — I159 Secondary hypertension, unspecified: Secondary | ICD-10-CM | POA: Diagnosis not present

## 2018-07-26 DIAGNOSIS — D689 Coagulation defect, unspecified: Secondary | ICD-10-CM | POA: Diagnosis not present

## 2018-07-26 DIAGNOSIS — Z4931 Encounter for adequacy testing for hemodialysis: Secondary | ICD-10-CM | POA: Diagnosis not present

## 2018-07-26 DIAGNOSIS — R110 Nausea: Secondary | ICD-10-CM | POA: Diagnosis not present

## 2018-07-26 DIAGNOSIS — R51 Headache: Secondary | ICD-10-CM | POA: Diagnosis not present

## 2018-07-26 DIAGNOSIS — D631 Anemia in chronic kidney disease: Secondary | ICD-10-CM | POA: Diagnosis not present

## 2018-07-29 DIAGNOSIS — R51 Headache: Secondary | ICD-10-CM | POA: Diagnosis not present

## 2018-07-29 DIAGNOSIS — R197 Diarrhea, unspecified: Secondary | ICD-10-CM | POA: Diagnosis not present

## 2018-07-29 DIAGNOSIS — D631 Anemia in chronic kidney disease: Secondary | ICD-10-CM | POA: Diagnosis not present

## 2018-07-29 DIAGNOSIS — R110 Nausea: Secondary | ICD-10-CM | POA: Diagnosis not present

## 2018-07-29 DIAGNOSIS — I159 Secondary hypertension, unspecified: Secondary | ICD-10-CM | POA: Diagnosis not present

## 2018-07-29 DIAGNOSIS — Z4931 Encounter for adequacy testing for hemodialysis: Secondary | ICD-10-CM | POA: Diagnosis not present

## 2018-07-29 DIAGNOSIS — N186 End stage renal disease: Secondary | ICD-10-CM | POA: Diagnosis not present

## 2018-07-29 DIAGNOSIS — N2581 Secondary hyperparathyroidism of renal origin: Secondary | ICD-10-CM | POA: Diagnosis not present

## 2018-07-29 DIAGNOSIS — D689 Coagulation defect, unspecified: Secondary | ICD-10-CM | POA: Diagnosis not present

## 2018-07-30 DIAGNOSIS — I159 Secondary hypertension, unspecified: Secondary | ICD-10-CM | POA: Diagnosis not present

## 2018-07-30 DIAGNOSIS — D631 Anemia in chronic kidney disease: Secondary | ICD-10-CM | POA: Diagnosis not present

## 2018-07-30 DIAGNOSIS — N2581 Secondary hyperparathyroidism of renal origin: Secondary | ICD-10-CM | POA: Diagnosis not present

## 2018-07-30 DIAGNOSIS — R51 Headache: Secondary | ICD-10-CM | POA: Diagnosis not present

## 2018-07-30 DIAGNOSIS — R197 Diarrhea, unspecified: Secondary | ICD-10-CM | POA: Diagnosis not present

## 2018-07-30 DIAGNOSIS — Z4931 Encounter for adequacy testing for hemodialysis: Secondary | ICD-10-CM | POA: Diagnosis not present

## 2018-07-30 DIAGNOSIS — D689 Coagulation defect, unspecified: Secondary | ICD-10-CM | POA: Diagnosis not present

## 2018-07-30 DIAGNOSIS — R110 Nausea: Secondary | ICD-10-CM | POA: Diagnosis not present

## 2018-07-30 DIAGNOSIS — N186 End stage renal disease: Secondary | ICD-10-CM | POA: Diagnosis not present

## 2018-08-01 DIAGNOSIS — R110 Nausea: Secondary | ICD-10-CM | POA: Diagnosis not present

## 2018-08-01 DIAGNOSIS — R197 Diarrhea, unspecified: Secondary | ICD-10-CM | POA: Diagnosis not present

## 2018-08-01 DIAGNOSIS — N2581 Secondary hyperparathyroidism of renal origin: Secondary | ICD-10-CM | POA: Diagnosis not present

## 2018-08-01 DIAGNOSIS — D689 Coagulation defect, unspecified: Secondary | ICD-10-CM | POA: Diagnosis not present

## 2018-08-01 DIAGNOSIS — R51 Headache: Secondary | ICD-10-CM | POA: Diagnosis not present

## 2018-08-01 DIAGNOSIS — Z4931 Encounter for adequacy testing for hemodialysis: Secondary | ICD-10-CM | POA: Diagnosis not present

## 2018-08-01 DIAGNOSIS — D631 Anemia in chronic kidney disease: Secondary | ICD-10-CM | POA: Diagnosis not present

## 2018-08-01 DIAGNOSIS — N186 End stage renal disease: Secondary | ICD-10-CM | POA: Diagnosis not present

## 2018-08-01 DIAGNOSIS — I159 Secondary hypertension, unspecified: Secondary | ICD-10-CM | POA: Diagnosis not present

## 2018-08-06 DIAGNOSIS — N2581 Secondary hyperparathyroidism of renal origin: Secondary | ICD-10-CM | POA: Diagnosis not present

## 2018-08-06 DIAGNOSIS — D631 Anemia in chronic kidney disease: Secondary | ICD-10-CM | POA: Diagnosis not present

## 2018-08-06 DIAGNOSIS — Z4931 Encounter for adequacy testing for hemodialysis: Secondary | ICD-10-CM | POA: Diagnosis not present

## 2018-08-06 DIAGNOSIS — I159 Secondary hypertension, unspecified: Secondary | ICD-10-CM | POA: Diagnosis not present

## 2018-08-06 DIAGNOSIS — R51 Headache: Secondary | ICD-10-CM | POA: Diagnosis not present

## 2018-08-06 DIAGNOSIS — R197 Diarrhea, unspecified: Secondary | ICD-10-CM | POA: Diagnosis not present

## 2018-08-06 DIAGNOSIS — R110 Nausea: Secondary | ICD-10-CM | POA: Diagnosis not present

## 2018-08-06 DIAGNOSIS — D689 Coagulation defect, unspecified: Secondary | ICD-10-CM | POA: Diagnosis not present

## 2018-08-06 DIAGNOSIS — N186 End stage renal disease: Secondary | ICD-10-CM | POA: Diagnosis not present

## 2018-08-08 DIAGNOSIS — R51 Headache: Secondary | ICD-10-CM | POA: Diagnosis not present

## 2018-08-08 DIAGNOSIS — D689 Coagulation defect, unspecified: Secondary | ICD-10-CM | POA: Diagnosis not present

## 2018-08-08 DIAGNOSIS — N186 End stage renal disease: Secondary | ICD-10-CM | POA: Diagnosis not present

## 2018-08-08 DIAGNOSIS — R110 Nausea: Secondary | ICD-10-CM | POA: Diagnosis not present

## 2018-08-08 DIAGNOSIS — Z4931 Encounter for adequacy testing for hemodialysis: Secondary | ICD-10-CM | POA: Diagnosis not present

## 2018-08-08 DIAGNOSIS — R197 Diarrhea, unspecified: Secondary | ICD-10-CM | POA: Diagnosis not present

## 2018-08-08 DIAGNOSIS — I159 Secondary hypertension, unspecified: Secondary | ICD-10-CM | POA: Diagnosis not present

## 2018-08-08 DIAGNOSIS — D631 Anemia in chronic kidney disease: Secondary | ICD-10-CM | POA: Diagnosis not present

## 2018-08-08 DIAGNOSIS — N2581 Secondary hyperparathyroidism of renal origin: Secondary | ICD-10-CM | POA: Diagnosis not present

## 2018-08-11 DIAGNOSIS — R110 Nausea: Secondary | ICD-10-CM | POA: Diagnosis not present

## 2018-08-11 DIAGNOSIS — D689 Coagulation defect, unspecified: Secondary | ICD-10-CM | POA: Diagnosis not present

## 2018-08-11 DIAGNOSIS — I159 Secondary hypertension, unspecified: Secondary | ICD-10-CM | POA: Diagnosis not present

## 2018-08-11 DIAGNOSIS — N2581 Secondary hyperparathyroidism of renal origin: Secondary | ICD-10-CM | POA: Diagnosis not present

## 2018-08-11 DIAGNOSIS — N186 End stage renal disease: Secondary | ICD-10-CM | POA: Diagnosis not present

## 2018-08-11 DIAGNOSIS — R197 Diarrhea, unspecified: Secondary | ICD-10-CM | POA: Diagnosis not present

## 2018-08-11 DIAGNOSIS — Z4931 Encounter for adequacy testing for hemodialysis: Secondary | ICD-10-CM | POA: Diagnosis not present

## 2018-08-11 DIAGNOSIS — R51 Headache: Secondary | ICD-10-CM | POA: Diagnosis not present

## 2018-08-11 DIAGNOSIS — D631 Anemia in chronic kidney disease: Secondary | ICD-10-CM | POA: Diagnosis not present

## 2018-08-12 DIAGNOSIS — N186 End stage renal disease: Secondary | ICD-10-CM | POA: Diagnosis not present

## 2018-08-12 DIAGNOSIS — E1122 Type 2 diabetes mellitus with diabetic chronic kidney disease: Secondary | ICD-10-CM | POA: Diagnosis not present

## 2018-08-12 DIAGNOSIS — Z992 Dependence on renal dialysis: Secondary | ICD-10-CM | POA: Diagnosis not present

## 2018-08-13 DIAGNOSIS — N186 End stage renal disease: Secondary | ICD-10-CM | POA: Diagnosis not present

## 2018-08-13 DIAGNOSIS — R51 Headache: Secondary | ICD-10-CM | POA: Diagnosis not present

## 2018-08-13 DIAGNOSIS — D689 Coagulation defect, unspecified: Secondary | ICD-10-CM | POA: Diagnosis not present

## 2018-08-13 DIAGNOSIS — Z4931 Encounter for adequacy testing for hemodialysis: Secondary | ICD-10-CM | POA: Diagnosis not present

## 2018-08-13 DIAGNOSIS — N2581 Secondary hyperparathyroidism of renal origin: Secondary | ICD-10-CM | POA: Diagnosis not present

## 2018-08-13 DIAGNOSIS — D631 Anemia in chronic kidney disease: Secondary | ICD-10-CM | POA: Diagnosis not present

## 2018-08-15 DIAGNOSIS — D631 Anemia in chronic kidney disease: Secondary | ICD-10-CM | POA: Diagnosis not present

## 2018-08-15 DIAGNOSIS — N2581 Secondary hyperparathyroidism of renal origin: Secondary | ICD-10-CM | POA: Diagnosis not present

## 2018-08-15 DIAGNOSIS — D689 Coagulation defect, unspecified: Secondary | ICD-10-CM | POA: Diagnosis not present

## 2018-08-15 DIAGNOSIS — N186 End stage renal disease: Secondary | ICD-10-CM | POA: Diagnosis not present

## 2018-08-15 DIAGNOSIS — R51 Headache: Secondary | ICD-10-CM | POA: Diagnosis not present

## 2018-08-15 DIAGNOSIS — Z4931 Encounter for adequacy testing for hemodialysis: Secondary | ICD-10-CM | POA: Diagnosis not present

## 2018-08-18 DIAGNOSIS — D689 Coagulation defect, unspecified: Secondary | ICD-10-CM | POA: Diagnosis not present

## 2018-08-18 DIAGNOSIS — N186 End stage renal disease: Secondary | ICD-10-CM | POA: Diagnosis not present

## 2018-08-18 DIAGNOSIS — D631 Anemia in chronic kidney disease: Secondary | ICD-10-CM | POA: Diagnosis not present

## 2018-08-18 DIAGNOSIS — R51 Headache: Secondary | ICD-10-CM | POA: Diagnosis not present

## 2018-08-18 DIAGNOSIS — E1140 Type 2 diabetes mellitus with diabetic neuropathy, unspecified: Secondary | ICD-10-CM | POA: Diagnosis not present

## 2018-08-18 DIAGNOSIS — R062 Wheezing: Secondary | ICD-10-CM | POA: Diagnosis not present

## 2018-08-18 DIAGNOSIS — I5033 Acute on chronic diastolic (congestive) heart failure: Secondary | ICD-10-CM | POA: Diagnosis not present

## 2018-08-18 DIAGNOSIS — N2581 Secondary hyperparathyroidism of renal origin: Secondary | ICD-10-CM | POA: Diagnosis not present

## 2018-08-18 DIAGNOSIS — Z4931 Encounter for adequacy testing for hemodialysis: Secondary | ICD-10-CM | POA: Diagnosis not present

## 2018-08-19 DIAGNOSIS — G610 Guillain-Barre syndrome: Secondary | ICD-10-CM | POA: Diagnosis not present

## 2018-08-19 DIAGNOSIS — J9610 Chronic respiratory failure, unspecified whether with hypoxia or hypercapnia: Secondary | ICD-10-CM | POA: Diagnosis not present

## 2018-08-20 DIAGNOSIS — Z4931 Encounter for adequacy testing for hemodialysis: Secondary | ICD-10-CM | POA: Diagnosis not present

## 2018-08-20 DIAGNOSIS — N2581 Secondary hyperparathyroidism of renal origin: Secondary | ICD-10-CM | POA: Diagnosis not present

## 2018-08-20 DIAGNOSIS — R51 Headache: Secondary | ICD-10-CM | POA: Diagnosis not present

## 2018-08-20 DIAGNOSIS — N186 End stage renal disease: Secondary | ICD-10-CM | POA: Diagnosis not present

## 2018-08-20 DIAGNOSIS — D689 Coagulation defect, unspecified: Secondary | ICD-10-CM | POA: Diagnosis not present

## 2018-08-20 DIAGNOSIS — D631 Anemia in chronic kidney disease: Secondary | ICD-10-CM | POA: Diagnosis not present

## 2018-08-22 DIAGNOSIS — Z4931 Encounter for adequacy testing for hemodialysis: Secondary | ICD-10-CM | POA: Diagnosis not present

## 2018-08-22 DIAGNOSIS — D689 Coagulation defect, unspecified: Secondary | ICD-10-CM | POA: Diagnosis not present

## 2018-08-22 DIAGNOSIS — N186 End stage renal disease: Secondary | ICD-10-CM | POA: Diagnosis not present

## 2018-08-22 DIAGNOSIS — R51 Headache: Secondary | ICD-10-CM | POA: Diagnosis not present

## 2018-08-22 DIAGNOSIS — D631 Anemia in chronic kidney disease: Secondary | ICD-10-CM | POA: Diagnosis not present

## 2018-08-22 DIAGNOSIS — N2581 Secondary hyperparathyroidism of renal origin: Secondary | ICD-10-CM | POA: Diagnosis not present

## 2018-08-25 DIAGNOSIS — N186 End stage renal disease: Secondary | ICD-10-CM | POA: Diagnosis not present

## 2018-08-25 DIAGNOSIS — D689 Coagulation defect, unspecified: Secondary | ICD-10-CM | POA: Diagnosis not present

## 2018-08-25 DIAGNOSIS — Z4931 Encounter for adequacy testing for hemodialysis: Secondary | ICD-10-CM | POA: Diagnosis not present

## 2018-08-25 DIAGNOSIS — N2581 Secondary hyperparathyroidism of renal origin: Secondary | ICD-10-CM | POA: Diagnosis not present

## 2018-08-25 DIAGNOSIS — D631 Anemia in chronic kidney disease: Secondary | ICD-10-CM | POA: Diagnosis not present

## 2018-08-25 DIAGNOSIS — R51 Headache: Secondary | ICD-10-CM | POA: Diagnosis not present

## 2018-08-27 DIAGNOSIS — D689 Coagulation defect, unspecified: Secondary | ICD-10-CM | POA: Diagnosis not present

## 2018-08-27 DIAGNOSIS — D631 Anemia in chronic kidney disease: Secondary | ICD-10-CM | POA: Diagnosis not present

## 2018-08-27 DIAGNOSIS — N2581 Secondary hyperparathyroidism of renal origin: Secondary | ICD-10-CM | POA: Diagnosis not present

## 2018-08-27 DIAGNOSIS — N186 End stage renal disease: Secondary | ICD-10-CM | POA: Diagnosis not present

## 2018-08-27 DIAGNOSIS — R51 Headache: Secondary | ICD-10-CM | POA: Diagnosis not present

## 2018-08-27 DIAGNOSIS — Z4931 Encounter for adequacy testing for hemodialysis: Secondary | ICD-10-CM | POA: Diagnosis not present

## 2018-08-29 DIAGNOSIS — Z4931 Encounter for adequacy testing for hemodialysis: Secondary | ICD-10-CM | POA: Diagnosis not present

## 2018-08-29 DIAGNOSIS — D689 Coagulation defect, unspecified: Secondary | ICD-10-CM | POA: Diagnosis not present

## 2018-08-29 DIAGNOSIS — R51 Headache: Secondary | ICD-10-CM | POA: Diagnosis not present

## 2018-08-29 DIAGNOSIS — N2581 Secondary hyperparathyroidism of renal origin: Secondary | ICD-10-CM | POA: Diagnosis not present

## 2018-08-29 DIAGNOSIS — N186 End stage renal disease: Secondary | ICD-10-CM | POA: Diagnosis not present

## 2018-08-29 DIAGNOSIS — D631 Anemia in chronic kidney disease: Secondary | ICD-10-CM | POA: Diagnosis not present

## 2018-09-01 DIAGNOSIS — D631 Anemia in chronic kidney disease: Secondary | ICD-10-CM | POA: Diagnosis not present

## 2018-09-01 DIAGNOSIS — R51 Headache: Secondary | ICD-10-CM | POA: Diagnosis not present

## 2018-09-01 DIAGNOSIS — N186 End stage renal disease: Secondary | ICD-10-CM | POA: Diagnosis not present

## 2018-09-01 DIAGNOSIS — N2581 Secondary hyperparathyroidism of renal origin: Secondary | ICD-10-CM | POA: Diagnosis not present

## 2018-09-01 DIAGNOSIS — Z4931 Encounter for adequacy testing for hemodialysis: Secondary | ICD-10-CM | POA: Diagnosis not present

## 2018-09-01 DIAGNOSIS — D689 Coagulation defect, unspecified: Secondary | ICD-10-CM | POA: Diagnosis not present

## 2018-09-03 DIAGNOSIS — N2581 Secondary hyperparathyroidism of renal origin: Secondary | ICD-10-CM | POA: Diagnosis not present

## 2018-09-03 DIAGNOSIS — R51 Headache: Secondary | ICD-10-CM | POA: Diagnosis not present

## 2018-09-03 DIAGNOSIS — Z4931 Encounter for adequacy testing for hemodialysis: Secondary | ICD-10-CM | POA: Diagnosis not present

## 2018-09-03 DIAGNOSIS — N186 End stage renal disease: Secondary | ICD-10-CM | POA: Diagnosis not present

## 2018-09-03 DIAGNOSIS — D689 Coagulation defect, unspecified: Secondary | ICD-10-CM | POA: Diagnosis not present

## 2018-09-03 DIAGNOSIS — D631 Anemia in chronic kidney disease: Secondary | ICD-10-CM | POA: Diagnosis not present

## 2018-09-05 DIAGNOSIS — D631 Anemia in chronic kidney disease: Secondary | ICD-10-CM | POA: Diagnosis not present

## 2018-09-05 DIAGNOSIS — N2581 Secondary hyperparathyroidism of renal origin: Secondary | ICD-10-CM | POA: Diagnosis not present

## 2018-09-05 DIAGNOSIS — Z4931 Encounter for adequacy testing for hemodialysis: Secondary | ICD-10-CM | POA: Diagnosis not present

## 2018-09-05 DIAGNOSIS — D689 Coagulation defect, unspecified: Secondary | ICD-10-CM | POA: Diagnosis not present

## 2018-09-05 DIAGNOSIS — R51 Headache: Secondary | ICD-10-CM | POA: Diagnosis not present

## 2018-09-05 DIAGNOSIS — N186 End stage renal disease: Secondary | ICD-10-CM | POA: Diagnosis not present

## 2018-09-08 DIAGNOSIS — Z4931 Encounter for adequacy testing for hemodialysis: Secondary | ICD-10-CM | POA: Diagnosis not present

## 2018-09-08 DIAGNOSIS — K5901 Slow transit constipation: Secondary | ICD-10-CM | POA: Diagnosis not present

## 2018-09-08 DIAGNOSIS — N186 End stage renal disease: Secondary | ICD-10-CM | POA: Diagnosis not present

## 2018-09-08 DIAGNOSIS — D689 Coagulation defect, unspecified: Secondary | ICD-10-CM | POA: Diagnosis not present

## 2018-09-08 DIAGNOSIS — R51 Headache: Secondary | ICD-10-CM | POA: Diagnosis not present

## 2018-09-08 DIAGNOSIS — N2581 Secondary hyperparathyroidism of renal origin: Secondary | ICD-10-CM | POA: Diagnosis not present

## 2018-09-08 DIAGNOSIS — K219 Gastro-esophageal reflux disease without esophagitis: Secondary | ICD-10-CM | POA: Diagnosis not present

## 2018-09-08 DIAGNOSIS — D631 Anemia in chronic kidney disease: Secondary | ICD-10-CM | POA: Diagnosis not present

## 2018-09-10 DIAGNOSIS — Z4931 Encounter for adequacy testing for hemodialysis: Secondary | ICD-10-CM | POA: Diagnosis not present

## 2018-09-10 DIAGNOSIS — D689 Coagulation defect, unspecified: Secondary | ICD-10-CM | POA: Diagnosis not present

## 2018-09-10 DIAGNOSIS — N2581 Secondary hyperparathyroidism of renal origin: Secondary | ICD-10-CM | POA: Diagnosis not present

## 2018-09-10 DIAGNOSIS — N186 End stage renal disease: Secondary | ICD-10-CM | POA: Diagnosis not present

## 2018-09-10 DIAGNOSIS — D631 Anemia in chronic kidney disease: Secondary | ICD-10-CM | POA: Diagnosis not present

## 2018-09-10 DIAGNOSIS — R51 Headache: Secondary | ICD-10-CM | POA: Diagnosis not present

## 2018-09-12 DIAGNOSIS — D689 Coagulation defect, unspecified: Secondary | ICD-10-CM | POA: Diagnosis not present

## 2018-09-12 DIAGNOSIS — R51 Headache: Secondary | ICD-10-CM | POA: Diagnosis not present

## 2018-09-12 DIAGNOSIS — Z4931 Encounter for adequacy testing for hemodialysis: Secondary | ICD-10-CM | POA: Diagnosis not present

## 2018-09-12 DIAGNOSIS — Z992 Dependence on renal dialysis: Secondary | ICD-10-CM | POA: Diagnosis not present

## 2018-09-12 DIAGNOSIS — N186 End stage renal disease: Secondary | ICD-10-CM | POA: Diagnosis not present

## 2018-09-12 DIAGNOSIS — E1122 Type 2 diabetes mellitus with diabetic chronic kidney disease: Secondary | ICD-10-CM | POA: Diagnosis not present

## 2018-09-12 DIAGNOSIS — N2581 Secondary hyperparathyroidism of renal origin: Secondary | ICD-10-CM | POA: Diagnosis not present

## 2018-09-12 DIAGNOSIS — D631 Anemia in chronic kidney disease: Secondary | ICD-10-CM | POA: Diagnosis not present

## 2018-09-15 DIAGNOSIS — Z992 Dependence on renal dialysis: Secondary | ICD-10-CM | POA: Diagnosis not present

## 2018-09-15 DIAGNOSIS — D509 Iron deficiency anemia, unspecified: Secondary | ICD-10-CM | POA: Diagnosis not present

## 2018-09-15 DIAGNOSIS — N2581 Secondary hyperparathyroidism of renal origin: Secondary | ICD-10-CM | POA: Diagnosis not present

## 2018-09-15 DIAGNOSIS — D631 Anemia in chronic kidney disease: Secondary | ICD-10-CM | POA: Diagnosis not present

## 2018-09-15 DIAGNOSIS — Z4931 Encounter for adequacy testing for hemodialysis: Secondary | ICD-10-CM | POA: Diagnosis not present

## 2018-09-15 DIAGNOSIS — N186 End stage renal disease: Secondary | ICD-10-CM | POA: Diagnosis not present

## 2018-09-15 DIAGNOSIS — D689 Coagulation defect, unspecified: Secondary | ICD-10-CM | POA: Diagnosis not present

## 2018-09-17 DIAGNOSIS — Z4931 Encounter for adequacy testing for hemodialysis: Secondary | ICD-10-CM | POA: Diagnosis not present

## 2018-09-17 DIAGNOSIS — Z992 Dependence on renal dialysis: Secondary | ICD-10-CM | POA: Diagnosis not present

## 2018-09-17 DIAGNOSIS — D509 Iron deficiency anemia, unspecified: Secondary | ICD-10-CM | POA: Diagnosis not present

## 2018-09-17 DIAGNOSIS — N186 End stage renal disease: Secondary | ICD-10-CM | POA: Diagnosis not present

## 2018-09-17 DIAGNOSIS — D689 Coagulation defect, unspecified: Secondary | ICD-10-CM | POA: Diagnosis not present

## 2018-09-17 DIAGNOSIS — D631 Anemia in chronic kidney disease: Secondary | ICD-10-CM | POA: Diagnosis not present

## 2018-09-17 DIAGNOSIS — N2581 Secondary hyperparathyroidism of renal origin: Secondary | ICD-10-CM | POA: Diagnosis not present

## 2018-09-18 DIAGNOSIS — R062 Wheezing: Secondary | ICD-10-CM | POA: Diagnosis not present

## 2018-09-18 DIAGNOSIS — E1140 Type 2 diabetes mellitus with diabetic neuropathy, unspecified: Secondary | ICD-10-CM | POA: Diagnosis not present

## 2018-09-18 DIAGNOSIS — I5033 Acute on chronic diastolic (congestive) heart failure: Secondary | ICD-10-CM | POA: Diagnosis not present

## 2018-09-19 DIAGNOSIS — D509 Iron deficiency anemia, unspecified: Secondary | ICD-10-CM | POA: Diagnosis not present

## 2018-09-19 DIAGNOSIS — N2581 Secondary hyperparathyroidism of renal origin: Secondary | ICD-10-CM | POA: Diagnosis not present

## 2018-09-19 DIAGNOSIS — G610 Guillain-Barre syndrome: Secondary | ICD-10-CM | POA: Diagnosis not present

## 2018-09-19 DIAGNOSIS — N186 End stage renal disease: Secondary | ICD-10-CM | POA: Diagnosis not present

## 2018-09-19 DIAGNOSIS — Z992 Dependence on renal dialysis: Secondary | ICD-10-CM | POA: Diagnosis not present

## 2018-09-19 DIAGNOSIS — Z4931 Encounter for adequacy testing for hemodialysis: Secondary | ICD-10-CM | POA: Diagnosis not present

## 2018-09-19 DIAGNOSIS — J9610 Chronic respiratory failure, unspecified whether with hypoxia or hypercapnia: Secondary | ICD-10-CM | POA: Diagnosis not present

## 2018-09-19 DIAGNOSIS — D631 Anemia in chronic kidney disease: Secondary | ICD-10-CM | POA: Diagnosis not present

## 2018-09-19 DIAGNOSIS — D689 Coagulation defect, unspecified: Secondary | ICD-10-CM | POA: Diagnosis not present

## 2018-09-24 DIAGNOSIS — D631 Anemia in chronic kidney disease: Secondary | ICD-10-CM | POA: Diagnosis not present

## 2018-09-24 DIAGNOSIS — Z4931 Encounter for adequacy testing for hemodialysis: Secondary | ICD-10-CM | POA: Diagnosis not present

## 2018-09-24 DIAGNOSIS — N2581 Secondary hyperparathyroidism of renal origin: Secondary | ICD-10-CM | POA: Diagnosis not present

## 2018-09-24 DIAGNOSIS — D509 Iron deficiency anemia, unspecified: Secondary | ICD-10-CM | POA: Diagnosis not present

## 2018-09-24 DIAGNOSIS — N186 End stage renal disease: Secondary | ICD-10-CM | POA: Diagnosis not present

## 2018-09-24 DIAGNOSIS — D689 Coagulation defect, unspecified: Secondary | ICD-10-CM | POA: Diagnosis not present

## 2018-09-24 DIAGNOSIS — E1122 Type 2 diabetes mellitus with diabetic chronic kidney disease: Secondary | ICD-10-CM | POA: Diagnosis not present

## 2018-09-24 DIAGNOSIS — Z992 Dependence on renal dialysis: Secondary | ICD-10-CM | POA: Diagnosis not present

## 2018-09-26 DIAGNOSIS — Z4931 Encounter for adequacy testing for hemodialysis: Secondary | ICD-10-CM | POA: Diagnosis not present

## 2018-09-26 DIAGNOSIS — Z992 Dependence on renal dialysis: Secondary | ICD-10-CM | POA: Diagnosis not present

## 2018-09-26 DIAGNOSIS — N186 End stage renal disease: Secondary | ICD-10-CM | POA: Diagnosis not present

## 2018-09-26 DIAGNOSIS — N2581 Secondary hyperparathyroidism of renal origin: Secondary | ICD-10-CM | POA: Diagnosis not present

## 2018-09-26 DIAGNOSIS — D631 Anemia in chronic kidney disease: Secondary | ICD-10-CM | POA: Diagnosis not present

## 2018-09-26 DIAGNOSIS — D689 Coagulation defect, unspecified: Secondary | ICD-10-CM | POA: Diagnosis not present

## 2018-09-26 DIAGNOSIS — D509 Iron deficiency anemia, unspecified: Secondary | ICD-10-CM | POA: Diagnosis not present

## 2018-09-29 DIAGNOSIS — Z4931 Encounter for adequacy testing for hemodialysis: Secondary | ICD-10-CM | POA: Diagnosis not present

## 2018-09-29 DIAGNOSIS — D689 Coagulation defect, unspecified: Secondary | ICD-10-CM | POA: Diagnosis not present

## 2018-09-29 DIAGNOSIS — D509 Iron deficiency anemia, unspecified: Secondary | ICD-10-CM | POA: Diagnosis not present

## 2018-09-29 DIAGNOSIS — Z992 Dependence on renal dialysis: Secondary | ICD-10-CM | POA: Diagnosis not present

## 2018-09-29 DIAGNOSIS — N2581 Secondary hyperparathyroidism of renal origin: Secondary | ICD-10-CM | POA: Diagnosis not present

## 2018-09-29 DIAGNOSIS — N186 End stage renal disease: Secondary | ICD-10-CM | POA: Diagnosis not present

## 2018-09-29 DIAGNOSIS — D631 Anemia in chronic kidney disease: Secondary | ICD-10-CM | POA: Diagnosis not present

## 2018-10-01 DIAGNOSIS — N186 End stage renal disease: Secondary | ICD-10-CM | POA: Diagnosis not present

## 2018-10-01 DIAGNOSIS — D631 Anemia in chronic kidney disease: Secondary | ICD-10-CM | POA: Diagnosis not present

## 2018-10-01 DIAGNOSIS — Z992 Dependence on renal dialysis: Secondary | ICD-10-CM | POA: Diagnosis not present

## 2018-10-01 DIAGNOSIS — D509 Iron deficiency anemia, unspecified: Secondary | ICD-10-CM | POA: Diagnosis not present

## 2018-10-01 DIAGNOSIS — N2581 Secondary hyperparathyroidism of renal origin: Secondary | ICD-10-CM | POA: Diagnosis not present

## 2018-10-01 DIAGNOSIS — D689 Coagulation defect, unspecified: Secondary | ICD-10-CM | POA: Diagnosis not present

## 2018-10-01 DIAGNOSIS — Z4931 Encounter for adequacy testing for hemodialysis: Secondary | ICD-10-CM | POA: Diagnosis not present

## 2018-10-03 DIAGNOSIS — N2581 Secondary hyperparathyroidism of renal origin: Secondary | ICD-10-CM | POA: Diagnosis not present

## 2018-10-03 DIAGNOSIS — D689 Coagulation defect, unspecified: Secondary | ICD-10-CM | POA: Diagnosis not present

## 2018-10-03 DIAGNOSIS — Z4931 Encounter for adequacy testing for hemodialysis: Secondary | ICD-10-CM | POA: Diagnosis not present

## 2018-10-03 DIAGNOSIS — D631 Anemia in chronic kidney disease: Secondary | ICD-10-CM | POA: Diagnosis not present

## 2018-10-03 DIAGNOSIS — Z992 Dependence on renal dialysis: Secondary | ICD-10-CM | POA: Diagnosis not present

## 2018-10-03 DIAGNOSIS — N186 End stage renal disease: Secondary | ICD-10-CM | POA: Diagnosis not present

## 2018-10-03 DIAGNOSIS — D509 Iron deficiency anemia, unspecified: Secondary | ICD-10-CM | POA: Diagnosis not present

## 2018-10-06 DIAGNOSIS — D631 Anemia in chronic kidney disease: Secondary | ICD-10-CM | POA: Diagnosis not present

## 2018-10-06 DIAGNOSIS — Z992 Dependence on renal dialysis: Secondary | ICD-10-CM | POA: Diagnosis not present

## 2018-10-06 DIAGNOSIS — D689 Coagulation defect, unspecified: Secondary | ICD-10-CM | POA: Diagnosis not present

## 2018-10-06 DIAGNOSIS — N2581 Secondary hyperparathyroidism of renal origin: Secondary | ICD-10-CM | POA: Diagnosis not present

## 2018-10-06 DIAGNOSIS — N186 End stage renal disease: Secondary | ICD-10-CM | POA: Diagnosis not present

## 2018-10-06 DIAGNOSIS — Z4931 Encounter for adequacy testing for hemodialysis: Secondary | ICD-10-CM | POA: Diagnosis not present

## 2018-10-06 DIAGNOSIS — D509 Iron deficiency anemia, unspecified: Secondary | ICD-10-CM | POA: Diagnosis not present

## 2018-10-08 DIAGNOSIS — Z4931 Encounter for adequacy testing for hemodialysis: Secondary | ICD-10-CM | POA: Diagnosis not present

## 2018-10-08 DIAGNOSIS — D509 Iron deficiency anemia, unspecified: Secondary | ICD-10-CM | POA: Diagnosis not present

## 2018-10-08 DIAGNOSIS — D631 Anemia in chronic kidney disease: Secondary | ICD-10-CM | POA: Diagnosis not present

## 2018-10-08 DIAGNOSIS — N186 End stage renal disease: Secondary | ICD-10-CM | POA: Diagnosis not present

## 2018-10-08 DIAGNOSIS — Z992 Dependence on renal dialysis: Secondary | ICD-10-CM | POA: Diagnosis not present

## 2018-10-08 DIAGNOSIS — D689 Coagulation defect, unspecified: Secondary | ICD-10-CM | POA: Diagnosis not present

## 2018-10-08 DIAGNOSIS — N2581 Secondary hyperparathyroidism of renal origin: Secondary | ICD-10-CM | POA: Diagnosis not present

## 2018-10-10 DIAGNOSIS — Z992 Dependence on renal dialysis: Secondary | ICD-10-CM | POA: Diagnosis not present

## 2018-10-10 DIAGNOSIS — D631 Anemia in chronic kidney disease: Secondary | ICD-10-CM | POA: Diagnosis not present

## 2018-10-10 DIAGNOSIS — D509 Iron deficiency anemia, unspecified: Secondary | ICD-10-CM | POA: Diagnosis not present

## 2018-10-10 DIAGNOSIS — N2581 Secondary hyperparathyroidism of renal origin: Secondary | ICD-10-CM | POA: Diagnosis not present

## 2018-10-10 DIAGNOSIS — Z4931 Encounter for adequacy testing for hemodialysis: Secondary | ICD-10-CM | POA: Diagnosis not present

## 2018-10-10 DIAGNOSIS — D689 Coagulation defect, unspecified: Secondary | ICD-10-CM | POA: Diagnosis not present

## 2018-10-10 DIAGNOSIS — N186 End stage renal disease: Secondary | ICD-10-CM | POA: Diagnosis not present

## 2018-10-13 DIAGNOSIS — D631 Anemia in chronic kidney disease: Secondary | ICD-10-CM | POA: Diagnosis not present

## 2018-10-13 DIAGNOSIS — Z992 Dependence on renal dialysis: Secondary | ICD-10-CM | POA: Diagnosis not present

## 2018-10-13 DIAGNOSIS — N2581 Secondary hyperparathyroidism of renal origin: Secondary | ICD-10-CM | POA: Diagnosis not present

## 2018-10-13 DIAGNOSIS — Z4931 Encounter for adequacy testing for hemodialysis: Secondary | ICD-10-CM | POA: Diagnosis not present

## 2018-10-13 DIAGNOSIS — E1122 Type 2 diabetes mellitus with diabetic chronic kidney disease: Secondary | ICD-10-CM | POA: Diagnosis not present

## 2018-10-13 DIAGNOSIS — N186 End stage renal disease: Secondary | ICD-10-CM | POA: Diagnosis not present

## 2018-10-13 DIAGNOSIS — D509 Iron deficiency anemia, unspecified: Secondary | ICD-10-CM | POA: Diagnosis not present

## 2018-10-13 DIAGNOSIS — D689 Coagulation defect, unspecified: Secondary | ICD-10-CM | POA: Diagnosis not present

## 2018-10-15 DIAGNOSIS — R51 Headache: Secondary | ICD-10-CM | POA: Diagnosis not present

## 2018-10-15 DIAGNOSIS — Z4931 Encounter for adequacy testing for hemodialysis: Secondary | ICD-10-CM | POA: Diagnosis not present

## 2018-10-15 DIAGNOSIS — D689 Coagulation defect, unspecified: Secondary | ICD-10-CM | POA: Diagnosis not present

## 2018-10-15 DIAGNOSIS — Z992 Dependence on renal dialysis: Secondary | ICD-10-CM | POA: Diagnosis not present

## 2018-10-15 DIAGNOSIS — N186 End stage renal disease: Secondary | ICD-10-CM | POA: Diagnosis not present

## 2018-10-15 DIAGNOSIS — D509 Iron deficiency anemia, unspecified: Secondary | ICD-10-CM | POA: Diagnosis not present

## 2018-10-15 DIAGNOSIS — D631 Anemia in chronic kidney disease: Secondary | ICD-10-CM | POA: Diagnosis not present

## 2018-10-15 DIAGNOSIS — N2581 Secondary hyperparathyroidism of renal origin: Secondary | ICD-10-CM | POA: Diagnosis not present

## 2018-10-17 DIAGNOSIS — D689 Coagulation defect, unspecified: Secondary | ICD-10-CM | POA: Diagnosis not present

## 2018-10-17 DIAGNOSIS — N186 End stage renal disease: Secondary | ICD-10-CM | POA: Diagnosis not present

## 2018-10-17 DIAGNOSIS — Z4931 Encounter for adequacy testing for hemodialysis: Secondary | ICD-10-CM | POA: Diagnosis not present

## 2018-10-17 DIAGNOSIS — D631 Anemia in chronic kidney disease: Secondary | ICD-10-CM | POA: Diagnosis not present

## 2018-10-17 DIAGNOSIS — R51 Headache: Secondary | ICD-10-CM | POA: Diagnosis not present

## 2018-10-17 DIAGNOSIS — D509 Iron deficiency anemia, unspecified: Secondary | ICD-10-CM | POA: Diagnosis not present

## 2018-10-17 DIAGNOSIS — Z992 Dependence on renal dialysis: Secondary | ICD-10-CM | POA: Diagnosis not present

## 2018-10-17 DIAGNOSIS — N2581 Secondary hyperparathyroidism of renal origin: Secondary | ICD-10-CM | POA: Diagnosis not present

## 2018-10-19 DIAGNOSIS — E1140 Type 2 diabetes mellitus with diabetic neuropathy, unspecified: Secondary | ICD-10-CM | POA: Diagnosis not present

## 2018-10-19 DIAGNOSIS — I5033 Acute on chronic diastolic (congestive) heart failure: Secondary | ICD-10-CM | POA: Diagnosis not present

## 2018-10-19 DIAGNOSIS — R062 Wheezing: Secondary | ICD-10-CM | POA: Diagnosis not present

## 2018-10-20 DIAGNOSIS — J9610 Chronic respiratory failure, unspecified whether with hypoxia or hypercapnia: Secondary | ICD-10-CM | POA: Diagnosis not present

## 2018-10-20 DIAGNOSIS — D509 Iron deficiency anemia, unspecified: Secondary | ICD-10-CM | POA: Diagnosis not present

## 2018-10-20 DIAGNOSIS — N186 End stage renal disease: Secondary | ICD-10-CM | POA: Diagnosis not present

## 2018-10-20 DIAGNOSIS — D631 Anemia in chronic kidney disease: Secondary | ICD-10-CM | POA: Diagnosis not present

## 2018-10-20 DIAGNOSIS — Z992 Dependence on renal dialysis: Secondary | ICD-10-CM | POA: Diagnosis not present

## 2018-10-20 DIAGNOSIS — D689 Coagulation defect, unspecified: Secondary | ICD-10-CM | POA: Diagnosis not present

## 2018-10-20 DIAGNOSIS — G610 Guillain-Barre syndrome: Secondary | ICD-10-CM | POA: Diagnosis not present

## 2018-10-20 DIAGNOSIS — R51 Headache: Secondary | ICD-10-CM | POA: Diagnosis not present

## 2018-10-20 DIAGNOSIS — N2581 Secondary hyperparathyroidism of renal origin: Secondary | ICD-10-CM | POA: Diagnosis not present

## 2018-10-20 DIAGNOSIS — Z4931 Encounter for adequacy testing for hemodialysis: Secondary | ICD-10-CM | POA: Diagnosis not present

## 2018-10-22 DIAGNOSIS — Z4931 Encounter for adequacy testing for hemodialysis: Secondary | ICD-10-CM | POA: Diagnosis not present

## 2018-10-22 DIAGNOSIS — N2581 Secondary hyperparathyroidism of renal origin: Secondary | ICD-10-CM | POA: Diagnosis not present

## 2018-10-22 DIAGNOSIS — Z992 Dependence on renal dialysis: Secondary | ICD-10-CM | POA: Diagnosis not present

## 2018-10-22 DIAGNOSIS — R51 Headache: Secondary | ICD-10-CM | POA: Diagnosis not present

## 2018-10-22 DIAGNOSIS — D689 Coagulation defect, unspecified: Secondary | ICD-10-CM | POA: Diagnosis not present

## 2018-10-22 DIAGNOSIS — D631 Anemia in chronic kidney disease: Secondary | ICD-10-CM | POA: Diagnosis not present

## 2018-10-22 DIAGNOSIS — D509 Iron deficiency anemia, unspecified: Secondary | ICD-10-CM | POA: Diagnosis not present

## 2018-10-22 DIAGNOSIS — N186 End stage renal disease: Secondary | ICD-10-CM | POA: Diagnosis not present

## 2018-10-24 DIAGNOSIS — R51 Headache: Secondary | ICD-10-CM | POA: Diagnosis not present

## 2018-10-24 DIAGNOSIS — N2581 Secondary hyperparathyroidism of renal origin: Secondary | ICD-10-CM | POA: Diagnosis not present

## 2018-10-24 DIAGNOSIS — N186 End stage renal disease: Secondary | ICD-10-CM | POA: Diagnosis not present

## 2018-10-24 DIAGNOSIS — Z4931 Encounter for adequacy testing for hemodialysis: Secondary | ICD-10-CM | POA: Diagnosis not present

## 2018-10-24 DIAGNOSIS — Z992 Dependence on renal dialysis: Secondary | ICD-10-CM | POA: Diagnosis not present

## 2018-10-24 DIAGNOSIS — D509 Iron deficiency anemia, unspecified: Secondary | ICD-10-CM | POA: Diagnosis not present

## 2018-10-24 DIAGNOSIS — D689 Coagulation defect, unspecified: Secondary | ICD-10-CM | POA: Diagnosis not present

## 2018-10-24 DIAGNOSIS — D631 Anemia in chronic kidney disease: Secondary | ICD-10-CM | POA: Diagnosis not present

## 2018-10-27 DIAGNOSIS — D509 Iron deficiency anemia, unspecified: Secondary | ICD-10-CM | POA: Diagnosis not present

## 2018-10-27 DIAGNOSIS — N2581 Secondary hyperparathyroidism of renal origin: Secondary | ICD-10-CM | POA: Diagnosis not present

## 2018-10-27 DIAGNOSIS — R51 Headache: Secondary | ICD-10-CM | POA: Diagnosis not present

## 2018-10-27 DIAGNOSIS — N186 End stage renal disease: Secondary | ICD-10-CM | POA: Diagnosis not present

## 2018-10-27 DIAGNOSIS — Z992 Dependence on renal dialysis: Secondary | ICD-10-CM | POA: Diagnosis not present

## 2018-10-27 DIAGNOSIS — D689 Coagulation defect, unspecified: Secondary | ICD-10-CM | POA: Diagnosis not present

## 2018-10-27 DIAGNOSIS — Z4931 Encounter for adequacy testing for hemodialysis: Secondary | ICD-10-CM | POA: Diagnosis not present

## 2018-10-27 DIAGNOSIS — D631 Anemia in chronic kidney disease: Secondary | ICD-10-CM | POA: Diagnosis not present

## 2018-10-29 DIAGNOSIS — D689 Coagulation defect, unspecified: Secondary | ICD-10-CM | POA: Diagnosis not present

## 2018-10-29 DIAGNOSIS — N186 End stage renal disease: Secondary | ICD-10-CM | POA: Diagnosis not present

## 2018-10-29 DIAGNOSIS — Z992 Dependence on renal dialysis: Secondary | ICD-10-CM | POA: Diagnosis not present

## 2018-10-29 DIAGNOSIS — D509 Iron deficiency anemia, unspecified: Secondary | ICD-10-CM | POA: Diagnosis not present

## 2018-10-29 DIAGNOSIS — N2581 Secondary hyperparathyroidism of renal origin: Secondary | ICD-10-CM | POA: Diagnosis not present

## 2018-10-29 DIAGNOSIS — D631 Anemia in chronic kidney disease: Secondary | ICD-10-CM | POA: Diagnosis not present

## 2018-10-29 DIAGNOSIS — R51 Headache: Secondary | ICD-10-CM | POA: Diagnosis not present

## 2018-10-29 DIAGNOSIS — Z4931 Encounter for adequacy testing for hemodialysis: Secondary | ICD-10-CM | POA: Diagnosis not present

## 2018-10-31 DIAGNOSIS — D509 Iron deficiency anemia, unspecified: Secondary | ICD-10-CM | POA: Diagnosis not present

## 2018-10-31 DIAGNOSIS — Z992 Dependence on renal dialysis: Secondary | ICD-10-CM | POA: Diagnosis not present

## 2018-10-31 DIAGNOSIS — D631 Anemia in chronic kidney disease: Secondary | ICD-10-CM | POA: Diagnosis not present

## 2018-10-31 DIAGNOSIS — N2581 Secondary hyperparathyroidism of renal origin: Secondary | ICD-10-CM | POA: Diagnosis not present

## 2018-10-31 DIAGNOSIS — N186 End stage renal disease: Secondary | ICD-10-CM | POA: Diagnosis not present

## 2018-10-31 DIAGNOSIS — R51 Headache: Secondary | ICD-10-CM | POA: Diagnosis not present

## 2018-10-31 DIAGNOSIS — D689 Coagulation defect, unspecified: Secondary | ICD-10-CM | POA: Diagnosis not present

## 2018-10-31 DIAGNOSIS — Z4931 Encounter for adequacy testing for hemodialysis: Secondary | ICD-10-CM | POA: Diagnosis not present

## 2018-11-03 DIAGNOSIS — D689 Coagulation defect, unspecified: Secondary | ICD-10-CM | POA: Diagnosis not present

## 2018-11-03 DIAGNOSIS — Z992 Dependence on renal dialysis: Secondary | ICD-10-CM | POA: Diagnosis not present

## 2018-11-03 DIAGNOSIS — N2581 Secondary hyperparathyroidism of renal origin: Secondary | ICD-10-CM | POA: Diagnosis not present

## 2018-11-03 DIAGNOSIS — D509 Iron deficiency anemia, unspecified: Secondary | ICD-10-CM | POA: Diagnosis not present

## 2018-11-03 DIAGNOSIS — D631 Anemia in chronic kidney disease: Secondary | ICD-10-CM | POA: Diagnosis not present

## 2018-11-03 DIAGNOSIS — Z4931 Encounter for adequacy testing for hemodialysis: Secondary | ICD-10-CM | POA: Diagnosis not present

## 2018-11-03 DIAGNOSIS — N186 End stage renal disease: Secondary | ICD-10-CM | POA: Diagnosis not present

## 2018-11-03 DIAGNOSIS — R51 Headache: Secondary | ICD-10-CM | POA: Diagnosis not present

## 2018-11-05 DIAGNOSIS — R51 Headache: Secondary | ICD-10-CM | POA: Diagnosis not present

## 2018-11-05 DIAGNOSIS — D631 Anemia in chronic kidney disease: Secondary | ICD-10-CM | POA: Diagnosis not present

## 2018-11-05 DIAGNOSIS — Z992 Dependence on renal dialysis: Secondary | ICD-10-CM | POA: Diagnosis not present

## 2018-11-05 DIAGNOSIS — N186 End stage renal disease: Secondary | ICD-10-CM | POA: Diagnosis not present

## 2018-11-05 DIAGNOSIS — N2581 Secondary hyperparathyroidism of renal origin: Secondary | ICD-10-CM | POA: Diagnosis not present

## 2018-11-05 DIAGNOSIS — Z4931 Encounter for adequacy testing for hemodialysis: Secondary | ICD-10-CM | POA: Diagnosis not present

## 2018-11-05 DIAGNOSIS — D689 Coagulation defect, unspecified: Secondary | ICD-10-CM | POA: Diagnosis not present

## 2018-11-05 DIAGNOSIS — D509 Iron deficiency anemia, unspecified: Secondary | ICD-10-CM | POA: Diagnosis not present

## 2018-11-07 DIAGNOSIS — D509 Iron deficiency anemia, unspecified: Secondary | ICD-10-CM | POA: Diagnosis not present

## 2018-11-07 DIAGNOSIS — D689 Coagulation defect, unspecified: Secondary | ICD-10-CM | POA: Diagnosis not present

## 2018-11-07 DIAGNOSIS — Z992 Dependence on renal dialysis: Secondary | ICD-10-CM | POA: Diagnosis not present

## 2018-11-07 DIAGNOSIS — R51 Headache: Secondary | ICD-10-CM | POA: Diagnosis not present

## 2018-11-07 DIAGNOSIS — N2581 Secondary hyperparathyroidism of renal origin: Secondary | ICD-10-CM | POA: Diagnosis not present

## 2018-11-07 DIAGNOSIS — Z4931 Encounter for adequacy testing for hemodialysis: Secondary | ICD-10-CM | POA: Diagnosis not present

## 2018-11-07 DIAGNOSIS — N186 End stage renal disease: Secondary | ICD-10-CM | POA: Diagnosis not present

## 2018-11-07 DIAGNOSIS — D631 Anemia in chronic kidney disease: Secondary | ICD-10-CM | POA: Diagnosis not present

## 2018-11-10 DIAGNOSIS — Z992 Dependence on renal dialysis: Secondary | ICD-10-CM | POA: Diagnosis not present

## 2018-11-10 DIAGNOSIS — N2581 Secondary hyperparathyroidism of renal origin: Secondary | ICD-10-CM | POA: Diagnosis not present

## 2018-11-10 DIAGNOSIS — D631 Anemia in chronic kidney disease: Secondary | ICD-10-CM | POA: Diagnosis not present

## 2018-11-10 DIAGNOSIS — D509 Iron deficiency anemia, unspecified: Secondary | ICD-10-CM | POA: Diagnosis not present

## 2018-11-10 DIAGNOSIS — D689 Coagulation defect, unspecified: Secondary | ICD-10-CM | POA: Diagnosis not present

## 2018-11-10 DIAGNOSIS — R51 Headache: Secondary | ICD-10-CM | POA: Diagnosis not present

## 2018-11-10 DIAGNOSIS — Z4931 Encounter for adequacy testing for hemodialysis: Secondary | ICD-10-CM | POA: Diagnosis not present

## 2018-11-10 DIAGNOSIS — N186 End stage renal disease: Secondary | ICD-10-CM | POA: Diagnosis not present

## 2018-11-12 DIAGNOSIS — Z992 Dependence on renal dialysis: Secondary | ICD-10-CM | POA: Diagnosis not present

## 2018-11-12 DIAGNOSIS — N186 End stage renal disease: Secondary | ICD-10-CM | POA: Diagnosis not present

## 2018-11-12 DIAGNOSIS — E1122 Type 2 diabetes mellitus with diabetic chronic kidney disease: Secondary | ICD-10-CM | POA: Diagnosis not present

## 2018-11-14 DIAGNOSIS — R519 Headache, unspecified: Secondary | ICD-10-CM | POA: Diagnosis not present

## 2018-11-14 DIAGNOSIS — D509 Iron deficiency anemia, unspecified: Secondary | ICD-10-CM | POA: Diagnosis not present

## 2018-11-14 DIAGNOSIS — Z4931 Encounter for adequacy testing for hemodialysis: Secondary | ICD-10-CM | POA: Diagnosis not present

## 2018-11-14 DIAGNOSIS — N186 End stage renal disease: Secondary | ICD-10-CM | POA: Diagnosis not present

## 2018-11-14 DIAGNOSIS — Z992 Dependence on renal dialysis: Secondary | ICD-10-CM | POA: Diagnosis not present

## 2018-11-14 DIAGNOSIS — D689 Coagulation defect, unspecified: Secondary | ICD-10-CM | POA: Diagnosis not present

## 2018-11-14 DIAGNOSIS — N2581 Secondary hyperparathyroidism of renal origin: Secondary | ICD-10-CM | POA: Diagnosis not present

## 2018-11-14 DIAGNOSIS — D631 Anemia in chronic kidney disease: Secondary | ICD-10-CM | POA: Diagnosis not present

## 2018-11-17 DIAGNOSIS — G610 Guillain-Barre syndrome: Secondary | ICD-10-CM | POA: Diagnosis not present

## 2018-11-17 DIAGNOSIS — N186 End stage renal disease: Secondary | ICD-10-CM | POA: Diagnosis not present

## 2018-11-17 DIAGNOSIS — R519 Headache, unspecified: Secondary | ICD-10-CM | POA: Diagnosis not present

## 2018-11-17 DIAGNOSIS — N2581 Secondary hyperparathyroidism of renal origin: Secondary | ICD-10-CM | POA: Diagnosis not present

## 2018-11-17 DIAGNOSIS — D509 Iron deficiency anemia, unspecified: Secondary | ICD-10-CM | POA: Diagnosis not present

## 2018-11-17 DIAGNOSIS — D689 Coagulation defect, unspecified: Secondary | ICD-10-CM | POA: Diagnosis not present

## 2018-11-17 DIAGNOSIS — Z992 Dependence on renal dialysis: Secondary | ICD-10-CM | POA: Diagnosis not present

## 2018-11-17 DIAGNOSIS — D631 Anemia in chronic kidney disease: Secondary | ICD-10-CM | POA: Diagnosis not present

## 2018-11-17 DIAGNOSIS — Z4931 Encounter for adequacy testing for hemodialysis: Secondary | ICD-10-CM | POA: Diagnosis not present

## 2018-11-18 DIAGNOSIS — I5033 Acute on chronic diastolic (congestive) heart failure: Secondary | ICD-10-CM | POA: Diagnosis not present

## 2018-11-18 DIAGNOSIS — R062 Wheezing: Secondary | ICD-10-CM | POA: Diagnosis not present

## 2018-11-18 DIAGNOSIS — E1140 Type 2 diabetes mellitus with diabetic neuropathy, unspecified: Secondary | ICD-10-CM | POA: Diagnosis not present

## 2018-11-19 DIAGNOSIS — Z4931 Encounter for adequacy testing for hemodialysis: Secondary | ICD-10-CM | POA: Diagnosis not present

## 2018-11-19 DIAGNOSIS — D509 Iron deficiency anemia, unspecified: Secondary | ICD-10-CM | POA: Diagnosis not present

## 2018-11-19 DIAGNOSIS — N186 End stage renal disease: Secondary | ICD-10-CM | POA: Diagnosis not present

## 2018-11-19 DIAGNOSIS — D689 Coagulation defect, unspecified: Secondary | ICD-10-CM | POA: Diagnosis not present

## 2018-11-19 DIAGNOSIS — N2581 Secondary hyperparathyroidism of renal origin: Secondary | ICD-10-CM | POA: Diagnosis not present

## 2018-11-19 DIAGNOSIS — G610 Guillain-Barre syndrome: Secondary | ICD-10-CM | POA: Diagnosis not present

## 2018-11-19 DIAGNOSIS — D631 Anemia in chronic kidney disease: Secondary | ICD-10-CM | POA: Diagnosis not present

## 2018-11-19 DIAGNOSIS — J9610 Chronic respiratory failure, unspecified whether with hypoxia or hypercapnia: Secondary | ICD-10-CM | POA: Diagnosis not present

## 2018-11-19 DIAGNOSIS — R519 Headache, unspecified: Secondary | ICD-10-CM | POA: Diagnosis not present

## 2018-11-19 DIAGNOSIS — Z992 Dependence on renal dialysis: Secondary | ICD-10-CM | POA: Diagnosis not present

## 2018-11-21 DIAGNOSIS — Z992 Dependence on renal dialysis: Secondary | ICD-10-CM | POA: Diagnosis not present

## 2018-11-21 DIAGNOSIS — D631 Anemia in chronic kidney disease: Secondary | ICD-10-CM | POA: Diagnosis not present

## 2018-11-21 DIAGNOSIS — Z4931 Encounter for adequacy testing for hemodialysis: Secondary | ICD-10-CM | POA: Diagnosis not present

## 2018-11-21 DIAGNOSIS — D689 Coagulation defect, unspecified: Secondary | ICD-10-CM | POA: Diagnosis not present

## 2018-11-21 DIAGNOSIS — N186 End stage renal disease: Secondary | ICD-10-CM | POA: Diagnosis not present

## 2018-11-21 DIAGNOSIS — D509 Iron deficiency anemia, unspecified: Secondary | ICD-10-CM | POA: Diagnosis not present

## 2018-11-21 DIAGNOSIS — N2581 Secondary hyperparathyroidism of renal origin: Secondary | ICD-10-CM | POA: Diagnosis not present

## 2018-11-21 DIAGNOSIS — R519 Headache, unspecified: Secondary | ICD-10-CM | POA: Diagnosis not present

## 2018-11-24 DIAGNOSIS — D509 Iron deficiency anemia, unspecified: Secondary | ICD-10-CM | POA: Diagnosis not present

## 2018-11-24 DIAGNOSIS — R519 Headache, unspecified: Secondary | ICD-10-CM | POA: Diagnosis not present

## 2018-11-24 DIAGNOSIS — D689 Coagulation defect, unspecified: Secondary | ICD-10-CM | POA: Diagnosis not present

## 2018-11-24 DIAGNOSIS — N186 End stage renal disease: Secondary | ICD-10-CM | POA: Diagnosis not present

## 2018-11-24 DIAGNOSIS — N2581 Secondary hyperparathyroidism of renal origin: Secondary | ICD-10-CM | POA: Diagnosis not present

## 2018-11-24 DIAGNOSIS — Z992 Dependence on renal dialysis: Secondary | ICD-10-CM | POA: Diagnosis not present

## 2018-11-24 DIAGNOSIS — Z4931 Encounter for adequacy testing for hemodialysis: Secondary | ICD-10-CM | POA: Diagnosis not present

## 2018-11-24 DIAGNOSIS — D631 Anemia in chronic kidney disease: Secondary | ICD-10-CM | POA: Diagnosis not present

## 2018-11-26 DIAGNOSIS — D631 Anemia in chronic kidney disease: Secondary | ICD-10-CM | POA: Diagnosis not present

## 2018-11-26 DIAGNOSIS — N186 End stage renal disease: Secondary | ICD-10-CM | POA: Diagnosis not present

## 2018-11-26 DIAGNOSIS — Z992 Dependence on renal dialysis: Secondary | ICD-10-CM | POA: Diagnosis not present

## 2018-11-26 DIAGNOSIS — D689 Coagulation defect, unspecified: Secondary | ICD-10-CM | POA: Diagnosis not present

## 2018-11-26 DIAGNOSIS — R519 Headache, unspecified: Secondary | ICD-10-CM | POA: Diagnosis not present

## 2018-11-26 DIAGNOSIS — N2581 Secondary hyperparathyroidism of renal origin: Secondary | ICD-10-CM | POA: Diagnosis not present

## 2018-11-26 DIAGNOSIS — Z4931 Encounter for adequacy testing for hemodialysis: Secondary | ICD-10-CM | POA: Diagnosis not present

## 2018-11-26 DIAGNOSIS — D509 Iron deficiency anemia, unspecified: Secondary | ICD-10-CM | POA: Diagnosis not present

## 2018-11-27 DIAGNOSIS — N186 End stage renal disease: Secondary | ICD-10-CM | POA: Diagnosis not present

## 2018-11-28 DIAGNOSIS — D689 Coagulation defect, unspecified: Secondary | ICD-10-CM | POA: Diagnosis not present

## 2018-11-28 DIAGNOSIS — N186 End stage renal disease: Secondary | ICD-10-CM | POA: Diagnosis not present

## 2018-11-28 DIAGNOSIS — R519 Headache, unspecified: Secondary | ICD-10-CM | POA: Diagnosis not present

## 2018-11-28 DIAGNOSIS — Z4931 Encounter for adequacy testing for hemodialysis: Secondary | ICD-10-CM | POA: Diagnosis not present

## 2018-11-28 DIAGNOSIS — D509 Iron deficiency anemia, unspecified: Secondary | ICD-10-CM | POA: Diagnosis not present

## 2018-11-28 DIAGNOSIS — Z992 Dependence on renal dialysis: Secondary | ICD-10-CM | POA: Diagnosis not present

## 2018-11-28 DIAGNOSIS — D631 Anemia in chronic kidney disease: Secondary | ICD-10-CM | POA: Diagnosis not present

## 2018-11-28 DIAGNOSIS — N2581 Secondary hyperparathyroidism of renal origin: Secondary | ICD-10-CM | POA: Diagnosis not present

## 2018-12-01 DIAGNOSIS — R519 Headache, unspecified: Secondary | ICD-10-CM | POA: Diagnosis not present

## 2018-12-01 DIAGNOSIS — D509 Iron deficiency anemia, unspecified: Secondary | ICD-10-CM | POA: Diagnosis not present

## 2018-12-01 DIAGNOSIS — D689 Coagulation defect, unspecified: Secondary | ICD-10-CM | POA: Diagnosis not present

## 2018-12-01 DIAGNOSIS — Z4931 Encounter for adequacy testing for hemodialysis: Secondary | ICD-10-CM | POA: Diagnosis not present

## 2018-12-01 DIAGNOSIS — N186 End stage renal disease: Secondary | ICD-10-CM | POA: Diagnosis not present

## 2018-12-01 DIAGNOSIS — D631 Anemia in chronic kidney disease: Secondary | ICD-10-CM | POA: Diagnosis not present

## 2018-12-01 DIAGNOSIS — Z992 Dependence on renal dialysis: Secondary | ICD-10-CM | POA: Diagnosis not present

## 2018-12-01 DIAGNOSIS — N2581 Secondary hyperparathyroidism of renal origin: Secondary | ICD-10-CM | POA: Diagnosis not present

## 2018-12-03 DIAGNOSIS — D631 Anemia in chronic kidney disease: Secondary | ICD-10-CM | POA: Diagnosis not present

## 2018-12-03 DIAGNOSIS — N2581 Secondary hyperparathyroidism of renal origin: Secondary | ICD-10-CM | POA: Diagnosis not present

## 2018-12-03 DIAGNOSIS — D689 Coagulation defect, unspecified: Secondary | ICD-10-CM | POA: Diagnosis not present

## 2018-12-03 DIAGNOSIS — R519 Headache, unspecified: Secondary | ICD-10-CM | POA: Diagnosis not present

## 2018-12-03 DIAGNOSIS — N186 End stage renal disease: Secondary | ICD-10-CM | POA: Diagnosis not present

## 2018-12-03 DIAGNOSIS — Z992 Dependence on renal dialysis: Secondary | ICD-10-CM | POA: Diagnosis not present

## 2018-12-03 DIAGNOSIS — D509 Iron deficiency anemia, unspecified: Secondary | ICD-10-CM | POA: Diagnosis not present

## 2018-12-03 DIAGNOSIS — Z4931 Encounter for adequacy testing for hemodialysis: Secondary | ICD-10-CM | POA: Diagnosis not present

## 2018-12-05 DIAGNOSIS — Z4931 Encounter for adequacy testing for hemodialysis: Secondary | ICD-10-CM | POA: Diagnosis not present

## 2018-12-05 DIAGNOSIS — N186 End stage renal disease: Secondary | ICD-10-CM | POA: Diagnosis not present

## 2018-12-05 DIAGNOSIS — D509 Iron deficiency anemia, unspecified: Secondary | ICD-10-CM | POA: Diagnosis not present

## 2018-12-05 DIAGNOSIS — D631 Anemia in chronic kidney disease: Secondary | ICD-10-CM | POA: Diagnosis not present

## 2018-12-05 DIAGNOSIS — Z992 Dependence on renal dialysis: Secondary | ICD-10-CM | POA: Diagnosis not present

## 2018-12-05 DIAGNOSIS — N2581 Secondary hyperparathyroidism of renal origin: Secondary | ICD-10-CM | POA: Diagnosis not present

## 2018-12-05 DIAGNOSIS — R519 Headache, unspecified: Secondary | ICD-10-CM | POA: Diagnosis not present

## 2018-12-05 DIAGNOSIS — D689 Coagulation defect, unspecified: Secondary | ICD-10-CM | POA: Diagnosis not present

## 2018-12-08 DIAGNOSIS — R519 Headache, unspecified: Secondary | ICD-10-CM | POA: Diagnosis not present

## 2018-12-08 DIAGNOSIS — D509 Iron deficiency anemia, unspecified: Secondary | ICD-10-CM | POA: Diagnosis not present

## 2018-12-08 DIAGNOSIS — D631 Anemia in chronic kidney disease: Secondary | ICD-10-CM | POA: Diagnosis not present

## 2018-12-08 DIAGNOSIS — N186 End stage renal disease: Secondary | ICD-10-CM | POA: Diagnosis not present

## 2018-12-08 DIAGNOSIS — D689 Coagulation defect, unspecified: Secondary | ICD-10-CM | POA: Diagnosis not present

## 2018-12-08 DIAGNOSIS — Z4931 Encounter for adequacy testing for hemodialysis: Secondary | ICD-10-CM | POA: Diagnosis not present

## 2018-12-08 DIAGNOSIS — Z992 Dependence on renal dialysis: Secondary | ICD-10-CM | POA: Diagnosis not present

## 2018-12-08 DIAGNOSIS — N2581 Secondary hyperparathyroidism of renal origin: Secondary | ICD-10-CM | POA: Diagnosis not present

## 2018-12-12 DIAGNOSIS — D689 Coagulation defect, unspecified: Secondary | ICD-10-CM | POA: Diagnosis not present

## 2018-12-12 DIAGNOSIS — Z4931 Encounter for adequacy testing for hemodialysis: Secondary | ICD-10-CM | POA: Diagnosis not present

## 2018-12-12 DIAGNOSIS — Z992 Dependence on renal dialysis: Secondary | ICD-10-CM | POA: Diagnosis not present

## 2018-12-12 DIAGNOSIS — D631 Anemia in chronic kidney disease: Secondary | ICD-10-CM | POA: Diagnosis not present

## 2018-12-12 DIAGNOSIS — N186 End stage renal disease: Secondary | ICD-10-CM | POA: Diagnosis not present

## 2018-12-12 DIAGNOSIS — N2581 Secondary hyperparathyroidism of renal origin: Secondary | ICD-10-CM | POA: Diagnosis not present

## 2018-12-12 DIAGNOSIS — D509 Iron deficiency anemia, unspecified: Secondary | ICD-10-CM | POA: Diagnosis not present

## 2018-12-12 DIAGNOSIS — R519 Headache, unspecified: Secondary | ICD-10-CM | POA: Diagnosis not present

## 2018-12-13 DIAGNOSIS — Z992 Dependence on renal dialysis: Secondary | ICD-10-CM | POA: Diagnosis not present

## 2018-12-13 DIAGNOSIS — N186 End stage renal disease: Secondary | ICD-10-CM | POA: Diagnosis not present

## 2018-12-13 DIAGNOSIS — E1122 Type 2 diabetes mellitus with diabetic chronic kidney disease: Secondary | ICD-10-CM | POA: Diagnosis not present

## 2018-12-15 DIAGNOSIS — D689 Coagulation defect, unspecified: Secondary | ICD-10-CM | POA: Diagnosis not present

## 2018-12-15 DIAGNOSIS — D509 Iron deficiency anemia, unspecified: Secondary | ICD-10-CM | POA: Diagnosis not present

## 2018-12-15 DIAGNOSIS — Z4931 Encounter for adequacy testing for hemodialysis: Secondary | ICD-10-CM | POA: Diagnosis not present

## 2018-12-15 DIAGNOSIS — N186 End stage renal disease: Secondary | ICD-10-CM | POA: Diagnosis not present

## 2018-12-15 DIAGNOSIS — N2581 Secondary hyperparathyroidism of renal origin: Secondary | ICD-10-CM | POA: Diagnosis not present

## 2018-12-15 DIAGNOSIS — D631 Anemia in chronic kidney disease: Secondary | ICD-10-CM | POA: Diagnosis not present

## 2018-12-15 DIAGNOSIS — Z992 Dependence on renal dialysis: Secondary | ICD-10-CM | POA: Diagnosis not present

## 2018-12-16 DIAGNOSIS — I5030 Unspecified diastolic (congestive) heart failure: Secondary | ICD-10-CM | POA: Diagnosis not present

## 2018-12-16 DIAGNOSIS — I10 Essential (primary) hypertension: Secondary | ICD-10-CM | POA: Diagnosis not present

## 2018-12-16 DIAGNOSIS — E1122 Type 2 diabetes mellitus with diabetic chronic kidney disease: Secondary | ICD-10-CM | POA: Diagnosis not present

## 2018-12-16 DIAGNOSIS — N186 End stage renal disease: Secondary | ICD-10-CM | POA: Diagnosis not present

## 2018-12-17 DIAGNOSIS — N2581 Secondary hyperparathyroidism of renal origin: Secondary | ICD-10-CM | POA: Diagnosis not present

## 2018-12-17 DIAGNOSIS — D689 Coagulation defect, unspecified: Secondary | ICD-10-CM | POA: Diagnosis not present

## 2018-12-17 DIAGNOSIS — N186 End stage renal disease: Secondary | ICD-10-CM | POA: Diagnosis not present

## 2018-12-17 DIAGNOSIS — D509 Iron deficiency anemia, unspecified: Secondary | ICD-10-CM | POA: Diagnosis not present

## 2018-12-17 DIAGNOSIS — Z992 Dependence on renal dialysis: Secondary | ICD-10-CM | POA: Diagnosis not present

## 2018-12-17 DIAGNOSIS — D631 Anemia in chronic kidney disease: Secondary | ICD-10-CM | POA: Diagnosis not present

## 2018-12-17 DIAGNOSIS — Z4931 Encounter for adequacy testing for hemodialysis: Secondary | ICD-10-CM | POA: Diagnosis not present

## 2018-12-19 DIAGNOSIS — D689 Coagulation defect, unspecified: Secondary | ICD-10-CM | POA: Diagnosis not present

## 2018-12-19 DIAGNOSIS — E1140 Type 2 diabetes mellitus with diabetic neuropathy, unspecified: Secondary | ICD-10-CM | POA: Diagnosis not present

## 2018-12-19 DIAGNOSIS — Z992 Dependence on renal dialysis: Secondary | ICD-10-CM | POA: Diagnosis not present

## 2018-12-19 DIAGNOSIS — I5033 Acute on chronic diastolic (congestive) heart failure: Secondary | ICD-10-CM | POA: Diagnosis not present

## 2018-12-19 DIAGNOSIS — N186 End stage renal disease: Secondary | ICD-10-CM | POA: Diagnosis not present

## 2018-12-19 DIAGNOSIS — D509 Iron deficiency anemia, unspecified: Secondary | ICD-10-CM | POA: Diagnosis not present

## 2018-12-19 DIAGNOSIS — Z4931 Encounter for adequacy testing for hemodialysis: Secondary | ICD-10-CM | POA: Diagnosis not present

## 2018-12-19 DIAGNOSIS — R062 Wheezing: Secondary | ICD-10-CM | POA: Diagnosis not present

## 2018-12-19 DIAGNOSIS — D631 Anemia in chronic kidney disease: Secondary | ICD-10-CM | POA: Diagnosis not present

## 2018-12-19 DIAGNOSIS — N2581 Secondary hyperparathyroidism of renal origin: Secondary | ICD-10-CM | POA: Diagnosis not present

## 2018-12-20 DIAGNOSIS — G610 Guillain-Barre syndrome: Secondary | ICD-10-CM | POA: Diagnosis not present

## 2018-12-20 DIAGNOSIS — J9610 Chronic respiratory failure, unspecified whether with hypoxia or hypercapnia: Secondary | ICD-10-CM | POA: Diagnosis not present

## 2018-12-22 DIAGNOSIS — N2581 Secondary hyperparathyroidism of renal origin: Secondary | ICD-10-CM | POA: Diagnosis not present

## 2018-12-22 DIAGNOSIS — N186 End stage renal disease: Secondary | ICD-10-CM | POA: Diagnosis not present

## 2018-12-22 DIAGNOSIS — D509 Iron deficiency anemia, unspecified: Secondary | ICD-10-CM | POA: Diagnosis not present

## 2018-12-22 DIAGNOSIS — D689 Coagulation defect, unspecified: Secondary | ICD-10-CM | POA: Diagnosis not present

## 2018-12-22 DIAGNOSIS — Z4931 Encounter for adequacy testing for hemodialysis: Secondary | ICD-10-CM | POA: Diagnosis not present

## 2018-12-22 DIAGNOSIS — Z992 Dependence on renal dialysis: Secondary | ICD-10-CM | POA: Diagnosis not present

## 2018-12-22 DIAGNOSIS — D631 Anemia in chronic kidney disease: Secondary | ICD-10-CM | POA: Diagnosis not present

## 2018-12-24 DIAGNOSIS — Z992 Dependence on renal dialysis: Secondary | ICD-10-CM | POA: Diagnosis not present

## 2018-12-24 DIAGNOSIS — D689 Coagulation defect, unspecified: Secondary | ICD-10-CM | POA: Diagnosis not present

## 2018-12-24 DIAGNOSIS — D631 Anemia in chronic kidney disease: Secondary | ICD-10-CM | POA: Diagnosis not present

## 2018-12-24 DIAGNOSIS — Z4931 Encounter for adequacy testing for hemodialysis: Secondary | ICD-10-CM | POA: Diagnosis not present

## 2018-12-24 DIAGNOSIS — N186 End stage renal disease: Secondary | ICD-10-CM | POA: Diagnosis not present

## 2018-12-24 DIAGNOSIS — E1122 Type 2 diabetes mellitus with diabetic chronic kidney disease: Secondary | ICD-10-CM | POA: Diagnosis not present

## 2018-12-24 DIAGNOSIS — N2581 Secondary hyperparathyroidism of renal origin: Secondary | ICD-10-CM | POA: Diagnosis not present

## 2018-12-24 DIAGNOSIS — D509 Iron deficiency anemia, unspecified: Secondary | ICD-10-CM | POA: Diagnosis not present

## 2018-12-26 DIAGNOSIS — D509 Iron deficiency anemia, unspecified: Secondary | ICD-10-CM | POA: Diagnosis not present

## 2018-12-26 DIAGNOSIS — N186 End stage renal disease: Secondary | ICD-10-CM | POA: Diagnosis not present

## 2018-12-26 DIAGNOSIS — D631 Anemia in chronic kidney disease: Secondary | ICD-10-CM | POA: Diagnosis not present

## 2018-12-26 DIAGNOSIS — Z4931 Encounter for adequacy testing for hemodialysis: Secondary | ICD-10-CM | POA: Diagnosis not present

## 2018-12-26 DIAGNOSIS — D689 Coagulation defect, unspecified: Secondary | ICD-10-CM | POA: Diagnosis not present

## 2018-12-26 DIAGNOSIS — Z992 Dependence on renal dialysis: Secondary | ICD-10-CM | POA: Diagnosis not present

## 2018-12-26 DIAGNOSIS — N2581 Secondary hyperparathyroidism of renal origin: Secondary | ICD-10-CM | POA: Diagnosis not present

## 2018-12-29 DIAGNOSIS — D509 Iron deficiency anemia, unspecified: Secondary | ICD-10-CM | POA: Diagnosis not present

## 2018-12-29 DIAGNOSIS — D631 Anemia in chronic kidney disease: Secondary | ICD-10-CM | POA: Diagnosis not present

## 2018-12-29 DIAGNOSIS — N2581 Secondary hyperparathyroidism of renal origin: Secondary | ICD-10-CM | POA: Diagnosis not present

## 2018-12-29 DIAGNOSIS — Z4931 Encounter for adequacy testing for hemodialysis: Secondary | ICD-10-CM | POA: Diagnosis not present

## 2018-12-29 DIAGNOSIS — Z992 Dependence on renal dialysis: Secondary | ICD-10-CM | POA: Diagnosis not present

## 2018-12-29 DIAGNOSIS — N186 End stage renal disease: Secondary | ICD-10-CM | POA: Diagnosis not present

## 2018-12-29 DIAGNOSIS — D689 Coagulation defect, unspecified: Secondary | ICD-10-CM | POA: Diagnosis not present

## 2018-12-30 DIAGNOSIS — D631 Anemia in chronic kidney disease: Secondary | ICD-10-CM | POA: Diagnosis not present

## 2018-12-30 DIAGNOSIS — E8770 Fluid overload, unspecified: Secondary | ICD-10-CM | POA: Diagnosis not present

## 2018-12-30 DIAGNOSIS — Z4931 Encounter for adequacy testing for hemodialysis: Secondary | ICD-10-CM | POA: Diagnosis not present

## 2018-12-30 DIAGNOSIS — Z992 Dependence on renal dialysis: Secondary | ICD-10-CM | POA: Diagnosis not present

## 2018-12-30 DIAGNOSIS — N2581 Secondary hyperparathyroidism of renal origin: Secondary | ICD-10-CM | POA: Diagnosis not present

## 2018-12-30 DIAGNOSIS — D689 Coagulation defect, unspecified: Secondary | ICD-10-CM | POA: Diagnosis not present

## 2018-12-30 DIAGNOSIS — D509 Iron deficiency anemia, unspecified: Secondary | ICD-10-CM | POA: Diagnosis not present

## 2018-12-30 DIAGNOSIS — N186 End stage renal disease: Secondary | ICD-10-CM | POA: Diagnosis not present

## 2018-12-31 DIAGNOSIS — N186 End stage renal disease: Secondary | ICD-10-CM | POA: Diagnosis not present

## 2018-12-31 DIAGNOSIS — Z992 Dependence on renal dialysis: Secondary | ICD-10-CM | POA: Diagnosis not present

## 2018-12-31 DIAGNOSIS — D509 Iron deficiency anemia, unspecified: Secondary | ICD-10-CM | POA: Diagnosis not present

## 2018-12-31 DIAGNOSIS — N2581 Secondary hyperparathyroidism of renal origin: Secondary | ICD-10-CM | POA: Diagnosis not present

## 2018-12-31 DIAGNOSIS — D689 Coagulation defect, unspecified: Secondary | ICD-10-CM | POA: Diagnosis not present

## 2018-12-31 DIAGNOSIS — D631 Anemia in chronic kidney disease: Secondary | ICD-10-CM | POA: Diagnosis not present

## 2018-12-31 DIAGNOSIS — Z4931 Encounter for adequacy testing for hemodialysis: Secondary | ICD-10-CM | POA: Diagnosis not present

## 2019-01-02 DIAGNOSIS — Z992 Dependence on renal dialysis: Secondary | ICD-10-CM | POA: Diagnosis not present

## 2019-01-02 DIAGNOSIS — N186 End stage renal disease: Secondary | ICD-10-CM | POA: Diagnosis not present

## 2019-01-02 DIAGNOSIS — D631 Anemia in chronic kidney disease: Secondary | ICD-10-CM | POA: Diagnosis not present

## 2019-01-02 DIAGNOSIS — Z4931 Encounter for adequacy testing for hemodialysis: Secondary | ICD-10-CM | POA: Diagnosis not present

## 2019-01-02 DIAGNOSIS — D509 Iron deficiency anemia, unspecified: Secondary | ICD-10-CM | POA: Diagnosis not present

## 2019-01-02 DIAGNOSIS — N2581 Secondary hyperparathyroidism of renal origin: Secondary | ICD-10-CM | POA: Diagnosis not present

## 2019-01-02 DIAGNOSIS — D689 Coagulation defect, unspecified: Secondary | ICD-10-CM | POA: Diagnosis not present

## 2019-01-04 DIAGNOSIS — D509 Iron deficiency anemia, unspecified: Secondary | ICD-10-CM | POA: Diagnosis not present

## 2019-01-04 DIAGNOSIS — D689 Coagulation defect, unspecified: Secondary | ICD-10-CM | POA: Diagnosis not present

## 2019-01-04 DIAGNOSIS — N2581 Secondary hyperparathyroidism of renal origin: Secondary | ICD-10-CM | POA: Diagnosis not present

## 2019-01-04 DIAGNOSIS — Z992 Dependence on renal dialysis: Secondary | ICD-10-CM | POA: Diagnosis not present

## 2019-01-04 DIAGNOSIS — N186 End stage renal disease: Secondary | ICD-10-CM | POA: Diagnosis not present

## 2019-01-04 DIAGNOSIS — Z4931 Encounter for adequacy testing for hemodialysis: Secondary | ICD-10-CM | POA: Diagnosis not present

## 2019-01-04 DIAGNOSIS — D631 Anemia in chronic kidney disease: Secondary | ICD-10-CM | POA: Diagnosis not present

## 2019-01-06 DIAGNOSIS — Z992 Dependence on renal dialysis: Secondary | ICD-10-CM | POA: Diagnosis not present

## 2019-01-06 DIAGNOSIS — N186 End stage renal disease: Secondary | ICD-10-CM | POA: Diagnosis not present

## 2019-01-06 DIAGNOSIS — N2581 Secondary hyperparathyroidism of renal origin: Secondary | ICD-10-CM | POA: Diagnosis not present

## 2019-01-06 DIAGNOSIS — D631 Anemia in chronic kidney disease: Secondary | ICD-10-CM | POA: Diagnosis not present

## 2019-01-06 DIAGNOSIS — D689 Coagulation defect, unspecified: Secondary | ICD-10-CM | POA: Diagnosis not present

## 2019-01-06 DIAGNOSIS — Z4931 Encounter for adequacy testing for hemodialysis: Secondary | ICD-10-CM | POA: Diagnosis not present

## 2019-01-06 DIAGNOSIS — D509 Iron deficiency anemia, unspecified: Secondary | ICD-10-CM | POA: Diagnosis not present

## 2019-01-09 DIAGNOSIS — D689 Coagulation defect, unspecified: Secondary | ICD-10-CM | POA: Diagnosis not present

## 2019-01-09 DIAGNOSIS — D631 Anemia in chronic kidney disease: Secondary | ICD-10-CM | POA: Diagnosis not present

## 2019-01-09 DIAGNOSIS — D509 Iron deficiency anemia, unspecified: Secondary | ICD-10-CM | POA: Diagnosis not present

## 2019-01-09 DIAGNOSIS — Z992 Dependence on renal dialysis: Secondary | ICD-10-CM | POA: Diagnosis not present

## 2019-01-09 DIAGNOSIS — Z4931 Encounter for adequacy testing for hemodialysis: Secondary | ICD-10-CM | POA: Diagnosis not present

## 2019-01-09 DIAGNOSIS — N186 End stage renal disease: Secondary | ICD-10-CM | POA: Diagnosis not present

## 2019-01-09 DIAGNOSIS — N2581 Secondary hyperparathyroidism of renal origin: Secondary | ICD-10-CM | POA: Diagnosis not present

## 2019-01-12 DIAGNOSIS — N2581 Secondary hyperparathyroidism of renal origin: Secondary | ICD-10-CM | POA: Diagnosis not present

## 2019-01-12 DIAGNOSIS — E1122 Type 2 diabetes mellitus with diabetic chronic kidney disease: Secondary | ICD-10-CM | POA: Diagnosis not present

## 2019-01-12 DIAGNOSIS — Z4931 Encounter for adequacy testing for hemodialysis: Secondary | ICD-10-CM | POA: Diagnosis not present

## 2019-01-12 DIAGNOSIS — D689 Coagulation defect, unspecified: Secondary | ICD-10-CM | POA: Diagnosis not present

## 2019-01-12 DIAGNOSIS — D509 Iron deficiency anemia, unspecified: Secondary | ICD-10-CM | POA: Diagnosis not present

## 2019-01-12 DIAGNOSIS — N186 End stage renal disease: Secondary | ICD-10-CM | POA: Diagnosis not present

## 2019-01-12 DIAGNOSIS — Z992 Dependence on renal dialysis: Secondary | ICD-10-CM | POA: Diagnosis not present

## 2019-01-12 DIAGNOSIS — D631 Anemia in chronic kidney disease: Secondary | ICD-10-CM | POA: Diagnosis not present

## 2019-01-14 DIAGNOSIS — Z4931 Encounter for adequacy testing for hemodialysis: Secondary | ICD-10-CM | POA: Diagnosis not present

## 2019-01-14 DIAGNOSIS — R519 Headache, unspecified: Secondary | ICD-10-CM | POA: Diagnosis not present

## 2019-01-14 DIAGNOSIS — Z992 Dependence on renal dialysis: Secondary | ICD-10-CM | POA: Diagnosis not present

## 2019-01-14 DIAGNOSIS — D689 Coagulation defect, unspecified: Secondary | ICD-10-CM | POA: Diagnosis not present

## 2019-01-14 DIAGNOSIS — N186 End stage renal disease: Secondary | ICD-10-CM | POA: Diagnosis not present

## 2019-01-14 DIAGNOSIS — N2581 Secondary hyperparathyroidism of renal origin: Secondary | ICD-10-CM | POA: Diagnosis not present

## 2019-01-14 DIAGNOSIS — D509 Iron deficiency anemia, unspecified: Secondary | ICD-10-CM | POA: Diagnosis not present

## 2019-01-14 DIAGNOSIS — D631 Anemia in chronic kidney disease: Secondary | ICD-10-CM | POA: Diagnosis not present

## 2019-01-16 DIAGNOSIS — N186 End stage renal disease: Secondary | ICD-10-CM | POA: Diagnosis not present

## 2019-01-16 DIAGNOSIS — D509 Iron deficiency anemia, unspecified: Secondary | ICD-10-CM | POA: Diagnosis not present

## 2019-01-16 DIAGNOSIS — N2581 Secondary hyperparathyroidism of renal origin: Secondary | ICD-10-CM | POA: Diagnosis not present

## 2019-01-16 DIAGNOSIS — Z992 Dependence on renal dialysis: Secondary | ICD-10-CM | POA: Diagnosis not present

## 2019-01-16 DIAGNOSIS — D631 Anemia in chronic kidney disease: Secondary | ICD-10-CM | POA: Diagnosis not present

## 2019-01-16 DIAGNOSIS — D689 Coagulation defect, unspecified: Secondary | ICD-10-CM | POA: Diagnosis not present

## 2019-01-16 DIAGNOSIS — Z4931 Encounter for adequacy testing for hemodialysis: Secondary | ICD-10-CM | POA: Diagnosis not present

## 2019-01-16 DIAGNOSIS — R519 Headache, unspecified: Secondary | ICD-10-CM | POA: Diagnosis not present

## 2019-01-18 DIAGNOSIS — E1140 Type 2 diabetes mellitus with diabetic neuropathy, unspecified: Secondary | ICD-10-CM | POA: Diagnosis not present

## 2019-01-18 DIAGNOSIS — I5033 Acute on chronic diastolic (congestive) heart failure: Secondary | ICD-10-CM | POA: Diagnosis not present

## 2019-01-18 DIAGNOSIS — R062 Wheezing: Secondary | ICD-10-CM | POA: Diagnosis not present

## 2019-01-19 DIAGNOSIS — D509 Iron deficiency anemia, unspecified: Secondary | ICD-10-CM | POA: Diagnosis not present

## 2019-01-19 DIAGNOSIS — Z4931 Encounter for adequacy testing for hemodialysis: Secondary | ICD-10-CM | POA: Diagnosis not present

## 2019-01-19 DIAGNOSIS — J9610 Chronic respiratory failure, unspecified whether with hypoxia or hypercapnia: Secondary | ICD-10-CM | POA: Diagnosis not present

## 2019-01-19 DIAGNOSIS — R519 Headache, unspecified: Secondary | ICD-10-CM | POA: Diagnosis not present

## 2019-01-19 DIAGNOSIS — D689 Coagulation defect, unspecified: Secondary | ICD-10-CM | POA: Diagnosis not present

## 2019-01-19 DIAGNOSIS — G610 Guillain-Barre syndrome: Secondary | ICD-10-CM | POA: Diagnosis not present

## 2019-01-19 DIAGNOSIS — N186 End stage renal disease: Secondary | ICD-10-CM | POA: Diagnosis not present

## 2019-01-19 DIAGNOSIS — D631 Anemia in chronic kidney disease: Secondary | ICD-10-CM | POA: Diagnosis not present

## 2019-01-19 DIAGNOSIS — N2581 Secondary hyperparathyroidism of renal origin: Secondary | ICD-10-CM | POA: Diagnosis not present

## 2019-01-19 DIAGNOSIS — Z992 Dependence on renal dialysis: Secondary | ICD-10-CM | POA: Diagnosis not present

## 2019-01-21 DIAGNOSIS — N2581 Secondary hyperparathyroidism of renal origin: Secondary | ICD-10-CM | POA: Diagnosis not present

## 2019-01-21 DIAGNOSIS — Z4931 Encounter for adequacy testing for hemodialysis: Secondary | ICD-10-CM | POA: Diagnosis not present

## 2019-01-21 DIAGNOSIS — D631 Anemia in chronic kidney disease: Secondary | ICD-10-CM | POA: Diagnosis not present

## 2019-01-21 DIAGNOSIS — Z01812 Encounter for preprocedural laboratory examination: Secondary | ICD-10-CM | POA: Diagnosis not present

## 2019-01-21 DIAGNOSIS — N186 End stage renal disease: Secondary | ICD-10-CM | POA: Diagnosis not present

## 2019-01-21 DIAGNOSIS — R519 Headache, unspecified: Secondary | ICD-10-CM | POA: Diagnosis not present

## 2019-01-21 DIAGNOSIS — D509 Iron deficiency anemia, unspecified: Secondary | ICD-10-CM | POA: Diagnosis not present

## 2019-01-21 DIAGNOSIS — D689 Coagulation defect, unspecified: Secondary | ICD-10-CM | POA: Diagnosis not present

## 2019-01-21 DIAGNOSIS — Z992 Dependence on renal dialysis: Secondary | ICD-10-CM | POA: Diagnosis not present

## 2019-01-22 DIAGNOSIS — Z4931 Encounter for adequacy testing for hemodialysis: Secondary | ICD-10-CM | POA: Diagnosis not present

## 2019-01-22 DIAGNOSIS — D631 Anemia in chronic kidney disease: Secondary | ICD-10-CM | POA: Diagnosis not present

## 2019-01-22 DIAGNOSIS — Z992 Dependence on renal dialysis: Secondary | ICD-10-CM | POA: Diagnosis not present

## 2019-01-22 DIAGNOSIS — R519 Headache, unspecified: Secondary | ICD-10-CM | POA: Diagnosis not present

## 2019-01-22 DIAGNOSIS — D689 Coagulation defect, unspecified: Secondary | ICD-10-CM | POA: Diagnosis not present

## 2019-01-22 DIAGNOSIS — N2581 Secondary hyperparathyroidism of renal origin: Secondary | ICD-10-CM | POA: Diagnosis not present

## 2019-01-22 DIAGNOSIS — D509 Iron deficiency anemia, unspecified: Secondary | ICD-10-CM | POA: Diagnosis not present

## 2019-01-22 DIAGNOSIS — N186 End stage renal disease: Secondary | ICD-10-CM | POA: Diagnosis not present

## 2019-01-23 DIAGNOSIS — Z885 Allergy status to narcotic agent status: Secondary | ICD-10-CM | POA: Diagnosis not present

## 2019-01-23 DIAGNOSIS — Z888 Allergy status to other drugs, medicaments and biological substances status: Secondary | ICD-10-CM | POA: Diagnosis not present

## 2019-01-23 DIAGNOSIS — E1165 Type 2 diabetes mellitus with hyperglycemia: Secondary | ICD-10-CM | POA: Diagnosis not present

## 2019-01-23 DIAGNOSIS — I4891 Unspecified atrial fibrillation: Secondary | ICD-10-CM | POA: Diagnosis not present

## 2019-01-23 DIAGNOSIS — Z9981 Dependence on supplemental oxygen: Secondary | ICD-10-CM | POA: Diagnosis not present

## 2019-01-23 DIAGNOSIS — Z794 Long term (current) use of insulin: Secondary | ICD-10-CM | POA: Diagnosis not present

## 2019-01-23 DIAGNOSIS — J9610 Chronic respiratory failure, unspecified whether with hypoxia or hypercapnia: Secondary | ICD-10-CM | POA: Diagnosis not present

## 2019-01-23 DIAGNOSIS — I132 Hypertensive heart and chronic kidney disease with heart failure and with stage 5 chronic kidney disease, or end stage renal disease: Secondary | ICD-10-CM | POA: Diagnosis not present

## 2019-01-23 DIAGNOSIS — J449 Chronic obstructive pulmonary disease, unspecified: Secondary | ICD-10-CM | POA: Diagnosis not present

## 2019-01-23 DIAGNOSIS — I5032 Chronic diastolic (congestive) heart failure: Secondary | ICD-10-CM | POA: Diagnosis not present

## 2019-01-23 DIAGNOSIS — E1122 Type 2 diabetes mellitus with diabetic chronic kidney disease: Secondary | ICD-10-CM | POA: Diagnosis not present

## 2019-01-23 DIAGNOSIS — Z85528 Personal history of other malignant neoplasm of kidney: Secondary | ICD-10-CM | POA: Diagnosis not present

## 2019-01-23 DIAGNOSIS — N186 End stage renal disease: Secondary | ICD-10-CM | POA: Diagnosis not present

## 2019-01-23 DIAGNOSIS — Z992 Dependence on renal dialysis: Secondary | ICD-10-CM | POA: Diagnosis not present

## 2019-01-23 DIAGNOSIS — G610 Guillain-Barre syndrome: Secondary | ICD-10-CM | POA: Diagnosis not present

## 2019-01-23 DIAGNOSIS — Z905 Acquired absence of kidney: Secondary | ICD-10-CM | POA: Diagnosis not present

## 2019-01-23 DIAGNOSIS — K219 Gastro-esophageal reflux disease without esophagitis: Secondary | ICD-10-CM | POA: Diagnosis not present

## 2019-01-23 DIAGNOSIS — Z87891 Personal history of nicotine dependence: Secondary | ICD-10-CM | POA: Diagnosis not present

## 2019-01-23 DIAGNOSIS — D631 Anemia in chronic kidney disease: Secondary | ICD-10-CM | POA: Diagnosis not present

## 2019-01-24 DIAGNOSIS — Z888 Allergy status to other drugs, medicaments and biological substances status: Secondary | ICD-10-CM | POA: Diagnosis not present

## 2019-01-24 DIAGNOSIS — Z885 Allergy status to narcotic agent status: Secondary | ICD-10-CM | POA: Diagnosis not present

## 2019-01-24 DIAGNOSIS — E1122 Type 2 diabetes mellitus with diabetic chronic kidney disease: Secondary | ICD-10-CM | POA: Diagnosis not present

## 2019-01-24 DIAGNOSIS — I5032 Chronic diastolic (congestive) heart failure: Secondary | ICD-10-CM | POA: Diagnosis not present

## 2019-01-24 DIAGNOSIS — Z905 Acquired absence of kidney: Secondary | ICD-10-CM | POA: Diagnosis not present

## 2019-01-24 DIAGNOSIS — Z85528 Personal history of other malignant neoplasm of kidney: Secondary | ICD-10-CM | POA: Diagnosis not present

## 2019-01-24 DIAGNOSIS — I4891 Unspecified atrial fibrillation: Secondary | ICD-10-CM | POA: Diagnosis not present

## 2019-01-24 DIAGNOSIS — D631 Anemia in chronic kidney disease: Secondary | ICD-10-CM | POA: Diagnosis not present

## 2019-01-24 DIAGNOSIS — J449 Chronic obstructive pulmonary disease, unspecified: Secondary | ICD-10-CM | POA: Diagnosis not present

## 2019-01-24 DIAGNOSIS — Z992 Dependence on renal dialysis: Secondary | ICD-10-CM | POA: Diagnosis not present

## 2019-01-24 DIAGNOSIS — Z87891 Personal history of nicotine dependence: Secondary | ICD-10-CM | POA: Diagnosis not present

## 2019-01-24 DIAGNOSIS — G610 Guillain-Barre syndrome: Secondary | ICD-10-CM | POA: Diagnosis not present

## 2019-01-24 DIAGNOSIS — I132 Hypertensive heart and chronic kidney disease with heart failure and with stage 5 chronic kidney disease, or end stage renal disease: Secondary | ICD-10-CM | POA: Diagnosis not present

## 2019-01-24 DIAGNOSIS — N186 End stage renal disease: Secondary | ICD-10-CM | POA: Diagnosis not present

## 2019-01-24 DIAGNOSIS — Z794 Long term (current) use of insulin: Secondary | ICD-10-CM | POA: Diagnosis not present

## 2019-01-24 DIAGNOSIS — Z9981 Dependence on supplemental oxygen: Secondary | ICD-10-CM | POA: Diagnosis not present

## 2019-01-24 DIAGNOSIS — J9610 Chronic respiratory failure, unspecified whether with hypoxia or hypercapnia: Secondary | ICD-10-CM | POA: Diagnosis not present

## 2019-01-24 DIAGNOSIS — K219 Gastro-esophageal reflux disease without esophagitis: Secondary | ICD-10-CM | POA: Diagnosis not present

## 2019-01-26 DIAGNOSIS — Z992 Dependence on renal dialysis: Secondary | ICD-10-CM | POA: Diagnosis not present

## 2019-01-26 DIAGNOSIS — N186 End stage renal disease: Secondary | ICD-10-CM | POA: Diagnosis not present

## 2019-01-26 DIAGNOSIS — N2581 Secondary hyperparathyroidism of renal origin: Secondary | ICD-10-CM | POA: Diagnosis not present

## 2019-01-26 DIAGNOSIS — Z4931 Encounter for adequacy testing for hemodialysis: Secondary | ICD-10-CM | POA: Diagnosis not present

## 2019-01-26 DIAGNOSIS — D631 Anemia in chronic kidney disease: Secondary | ICD-10-CM | POA: Diagnosis not present

## 2019-01-26 DIAGNOSIS — R519 Headache, unspecified: Secondary | ICD-10-CM | POA: Diagnosis not present

## 2019-01-26 DIAGNOSIS — D509 Iron deficiency anemia, unspecified: Secondary | ICD-10-CM | POA: Diagnosis not present

## 2019-01-26 DIAGNOSIS — D689 Coagulation defect, unspecified: Secondary | ICD-10-CM | POA: Diagnosis not present

## 2019-01-28 DIAGNOSIS — D689 Coagulation defect, unspecified: Secondary | ICD-10-CM | POA: Diagnosis not present

## 2019-01-28 DIAGNOSIS — D631 Anemia in chronic kidney disease: Secondary | ICD-10-CM | POA: Diagnosis not present

## 2019-01-28 DIAGNOSIS — R519 Headache, unspecified: Secondary | ICD-10-CM | POA: Diagnosis not present

## 2019-01-28 DIAGNOSIS — Z4931 Encounter for adequacy testing for hemodialysis: Secondary | ICD-10-CM | POA: Diagnosis not present

## 2019-01-28 DIAGNOSIS — Z992 Dependence on renal dialysis: Secondary | ICD-10-CM | POA: Diagnosis not present

## 2019-01-28 DIAGNOSIS — N2581 Secondary hyperparathyroidism of renal origin: Secondary | ICD-10-CM | POA: Diagnosis not present

## 2019-01-28 DIAGNOSIS — D509 Iron deficiency anemia, unspecified: Secondary | ICD-10-CM | POA: Diagnosis not present

## 2019-01-28 DIAGNOSIS — N186 End stage renal disease: Secondary | ICD-10-CM | POA: Diagnosis not present

## 2019-01-30 DIAGNOSIS — D509 Iron deficiency anemia, unspecified: Secondary | ICD-10-CM | POA: Diagnosis not present

## 2019-01-30 DIAGNOSIS — N2581 Secondary hyperparathyroidism of renal origin: Secondary | ICD-10-CM | POA: Diagnosis not present

## 2019-01-30 DIAGNOSIS — D689 Coagulation defect, unspecified: Secondary | ICD-10-CM | POA: Diagnosis not present

## 2019-01-30 DIAGNOSIS — R519 Headache, unspecified: Secondary | ICD-10-CM | POA: Diagnosis not present

## 2019-01-30 DIAGNOSIS — Z992 Dependence on renal dialysis: Secondary | ICD-10-CM | POA: Diagnosis not present

## 2019-01-30 DIAGNOSIS — N186 End stage renal disease: Secondary | ICD-10-CM | POA: Diagnosis not present

## 2019-01-30 DIAGNOSIS — D631 Anemia in chronic kidney disease: Secondary | ICD-10-CM | POA: Diagnosis not present

## 2019-01-30 DIAGNOSIS — Z4931 Encounter for adequacy testing for hemodialysis: Secondary | ICD-10-CM | POA: Diagnosis not present

## 2019-02-02 DIAGNOSIS — N186 End stage renal disease: Secondary | ICD-10-CM | POA: Diagnosis not present

## 2019-02-02 DIAGNOSIS — R519 Headache, unspecified: Secondary | ICD-10-CM | POA: Diagnosis not present

## 2019-02-02 DIAGNOSIS — Z992 Dependence on renal dialysis: Secondary | ICD-10-CM | POA: Diagnosis not present

## 2019-02-02 DIAGNOSIS — D631 Anemia in chronic kidney disease: Secondary | ICD-10-CM | POA: Diagnosis not present

## 2019-02-02 DIAGNOSIS — D509 Iron deficiency anemia, unspecified: Secondary | ICD-10-CM | POA: Diagnosis not present

## 2019-02-02 DIAGNOSIS — N2581 Secondary hyperparathyroidism of renal origin: Secondary | ICD-10-CM | POA: Diagnosis not present

## 2019-02-02 DIAGNOSIS — Z4931 Encounter for adequacy testing for hemodialysis: Secondary | ICD-10-CM | POA: Diagnosis not present

## 2019-02-02 DIAGNOSIS — D689 Coagulation defect, unspecified: Secondary | ICD-10-CM | POA: Diagnosis not present

## 2019-02-04 DIAGNOSIS — R29818 Other symptoms and signs involving the nervous system: Secondary | ICD-10-CM | POA: Diagnosis not present

## 2019-02-04 DIAGNOSIS — E1165 Type 2 diabetes mellitus with hyperglycemia: Secondary | ICD-10-CM | POA: Diagnosis not present

## 2019-02-04 DIAGNOSIS — M4802 Spinal stenosis, cervical region: Secondary | ICD-10-CM | POA: Diagnosis not present

## 2019-02-04 DIAGNOSIS — I10 Essential (primary) hypertension: Secondary | ICD-10-CM | POA: Diagnosis not present

## 2019-02-04 DIAGNOSIS — D631 Anemia in chronic kidney disease: Secondary | ICD-10-CM | POA: Diagnosis not present

## 2019-02-04 DIAGNOSIS — R200 Anesthesia of skin: Secondary | ICD-10-CM | POA: Diagnosis not present

## 2019-02-04 DIAGNOSIS — I132 Hypertensive heart and chronic kidney disease with heart failure and with stage 5 chronic kidney disease, or end stage renal disease: Secondary | ICD-10-CM | POA: Diagnosis not present

## 2019-02-04 DIAGNOSIS — R531 Weakness: Secondary | ICD-10-CM | POA: Diagnosis not present

## 2019-02-04 DIAGNOSIS — Z992 Dependence on renal dialysis: Secondary | ICD-10-CM | POA: Diagnosis not present

## 2019-02-04 DIAGNOSIS — Z743 Need for continuous supervision: Secondary | ICD-10-CM | POA: Diagnosis not present

## 2019-02-04 DIAGNOSIS — R29898 Other symptoms and signs involving the musculoskeletal system: Secondary | ICD-10-CM | POA: Diagnosis not present

## 2019-02-04 DIAGNOSIS — M5001 Cervical disc disorder with myelopathy,  high cervical region: Secondary | ICD-10-CM | POA: Diagnosis not present

## 2019-02-04 DIAGNOSIS — R202 Paresthesia of skin: Secondary | ICD-10-CM | POA: Diagnosis not present

## 2019-02-04 DIAGNOSIS — N186 End stage renal disease: Secondary | ICD-10-CM | POA: Diagnosis not present

## 2019-02-04 DIAGNOSIS — I120 Hypertensive chronic kidney disease with stage 5 chronic kidney disease or end stage renal disease: Secondary | ICD-10-CM | POA: Diagnosis not present

## 2019-02-04 DIAGNOSIS — I5030 Unspecified diastolic (congestive) heart failure: Secondary | ICD-10-CM | POA: Diagnosis not present

## 2019-02-05 DIAGNOSIS — G610 Guillain-Barre syndrome: Secondary | ICD-10-CM | POA: Diagnosis not present

## 2019-02-05 DIAGNOSIS — J90 Pleural effusion, not elsewhere classified: Secondary | ICD-10-CM | POA: Diagnosis not present

## 2019-02-05 DIAGNOSIS — R05 Cough: Secondary | ICD-10-CM | POA: Diagnosis not present

## 2019-02-05 DIAGNOSIS — R0689 Other abnormalities of breathing: Secondary | ICD-10-CM | POA: Diagnosis not present

## 2019-02-05 DIAGNOSIS — M898X9 Other specified disorders of bone, unspecified site: Secondary | ICD-10-CM | POA: Diagnosis not present

## 2019-02-05 DIAGNOSIS — R918 Other nonspecific abnormal finding of lung field: Secondary | ICD-10-CM | POA: Diagnosis not present

## 2019-02-05 DIAGNOSIS — R200 Anesthesia of skin: Secondary | ICD-10-CM | POA: Diagnosis not present

## 2019-02-05 DIAGNOSIS — J9691 Respiratory failure, unspecified with hypoxia: Secondary | ICD-10-CM | POA: Diagnosis not present

## 2019-02-05 DIAGNOSIS — Z992 Dependence on renal dialysis: Secondary | ICD-10-CM | POA: Diagnosis not present

## 2019-02-05 DIAGNOSIS — R29898 Other symptoms and signs involving the musculoskeletal system: Secondary | ICD-10-CM | POA: Diagnosis not present

## 2019-02-05 DIAGNOSIS — I509 Heart failure, unspecified: Secondary | ICD-10-CM | POA: Diagnosis not present

## 2019-02-05 DIAGNOSIS — R202 Paresthesia of skin: Secondary | ICD-10-CM | POA: Diagnosis not present

## 2019-02-05 DIAGNOSIS — R531 Weakness: Secondary | ICD-10-CM | POA: Diagnosis not present

## 2019-02-05 DIAGNOSIS — G6289 Other specified polyneuropathies: Secondary | ICD-10-CM | POA: Diagnosis not present

## 2019-02-05 DIAGNOSIS — N189 Chronic kidney disease, unspecified: Secondary | ICD-10-CM | POA: Diagnosis not present

## 2019-02-05 DIAGNOSIS — I120 Hypertensive chronic kidney disease with stage 5 chronic kidney disease or end stage renal disease: Secondary | ICD-10-CM | POA: Diagnosis not present

## 2019-02-06 DIAGNOSIS — E1122 Type 2 diabetes mellitus with diabetic chronic kidney disease: Secondary | ICD-10-CM | POA: Diagnosis not present

## 2019-02-06 DIAGNOSIS — I120 Hypertensive chronic kidney disease with stage 5 chronic kidney disease or end stage renal disease: Secondary | ICD-10-CM | POA: Diagnosis not present

## 2019-02-06 DIAGNOSIS — R29898 Other symptoms and signs involving the musculoskeletal system: Secondary | ICD-10-CM | POA: Diagnosis not present

## 2019-02-06 DIAGNOSIS — I959 Hypotension, unspecified: Secondary | ICD-10-CM | POA: Diagnosis not present

## 2019-02-06 DIAGNOSIS — R202 Paresthesia of skin: Secondary | ICD-10-CM | POA: Diagnosis not present

## 2019-02-06 DIAGNOSIS — J9601 Acute respiratory failure with hypoxia: Secondary | ICD-10-CM | POA: Diagnosis not present

## 2019-02-06 DIAGNOSIS — R109 Unspecified abdominal pain: Secondary | ICD-10-CM | POA: Diagnosis not present

## 2019-02-06 DIAGNOSIS — G6289 Other specified polyneuropathies: Secondary | ICD-10-CM | POA: Diagnosis not present

## 2019-02-06 DIAGNOSIS — R200 Anesthesia of skin: Secondary | ICD-10-CM | POA: Diagnosis not present

## 2019-02-06 DIAGNOSIS — I5030 Unspecified diastolic (congestive) heart failure: Secondary | ICD-10-CM | POA: Diagnosis not present

## 2019-02-07 DIAGNOSIS — Z452 Encounter for adjustment and management of vascular access device: Secondary | ICD-10-CM | POA: Diagnosis not present

## 2019-02-07 DIAGNOSIS — G6289 Other specified polyneuropathies: Secondary | ICD-10-CM | POA: Diagnosis not present

## 2019-02-07 DIAGNOSIS — R202 Paresthesia of skin: Secondary | ICD-10-CM | POA: Diagnosis not present

## 2019-02-07 DIAGNOSIS — J9601 Acute respiratory failure with hypoxia: Secondary | ICD-10-CM | POA: Diagnosis not present

## 2019-02-07 DIAGNOSIS — R918 Other nonspecific abnormal finding of lung field: Secondary | ICD-10-CM | POA: Diagnosis not present

## 2019-02-07 DIAGNOSIS — R0603 Acute respiratory distress: Secondary | ICD-10-CM | POA: Diagnosis not present

## 2019-02-07 DIAGNOSIS — R109 Unspecified abdominal pain: Secondary | ICD-10-CM | POA: Diagnosis not present

## 2019-02-07 DIAGNOSIS — G610 Guillain-Barre syndrome: Secondary | ICD-10-CM | POA: Diagnosis not present

## 2019-02-07 DIAGNOSIS — R531 Weakness: Secondary | ICD-10-CM | POA: Diagnosis not present

## 2019-02-07 DIAGNOSIS — Z9911 Dependence on respirator [ventilator] status: Secondary | ICD-10-CM | POA: Diagnosis not present

## 2019-02-07 DIAGNOSIS — R200 Anesthesia of skin: Secondary | ICD-10-CM | POA: Diagnosis not present

## 2019-02-07 DIAGNOSIS — R42 Dizziness and giddiness: Secondary | ICD-10-CM | POA: Diagnosis not present

## 2019-02-07 DIAGNOSIS — I5030 Unspecified diastolic (congestive) heart failure: Secondary | ICD-10-CM | POA: Diagnosis not present

## 2019-02-07 DIAGNOSIS — R110 Nausea: Secondary | ICD-10-CM | POA: Diagnosis not present

## 2019-02-07 DIAGNOSIS — K6389 Other specified diseases of intestine: Secondary | ICD-10-CM | POA: Diagnosis not present

## 2019-02-07 DIAGNOSIS — R29898 Other symptoms and signs involving the musculoskeletal system: Secondary | ICD-10-CM | POA: Diagnosis not present

## 2019-02-07 DIAGNOSIS — Z4682 Encounter for fitting and adjustment of non-vascular catheter: Secondary | ICD-10-CM | POA: Diagnosis not present

## 2019-02-07 DIAGNOSIS — I959 Hypotension, unspecified: Secondary | ICD-10-CM | POA: Diagnosis not present

## 2019-02-08 DIAGNOSIS — I5030 Unspecified diastolic (congestive) heart failure: Secondary | ICD-10-CM | POA: Diagnosis not present

## 2019-02-08 DIAGNOSIS — Z992 Dependence on renal dialysis: Secondary | ICD-10-CM | POA: Diagnosis not present

## 2019-02-08 DIAGNOSIS — R29898 Other symptoms and signs involving the musculoskeletal system: Secondary | ICD-10-CM | POA: Diagnosis not present

## 2019-02-08 DIAGNOSIS — J9601 Acute respiratory failure with hypoxia: Secondary | ICD-10-CM | POA: Diagnosis not present

## 2019-02-08 DIAGNOSIS — R200 Anesthesia of skin: Secondary | ICD-10-CM | POA: Diagnosis not present

## 2019-02-08 DIAGNOSIS — R579 Shock, unspecified: Secondary | ICD-10-CM | POA: Diagnosis not present

## 2019-02-08 DIAGNOSIS — N186 End stage renal disease: Secondary | ICD-10-CM | POA: Diagnosis not present

## 2019-02-08 DIAGNOSIS — R202 Paresthesia of skin: Secondary | ICD-10-CM | POA: Diagnosis not present

## 2019-02-08 DIAGNOSIS — I120 Hypertensive chronic kidney disease with stage 5 chronic kidney disease or end stage renal disease: Secondary | ICD-10-CM | POA: Diagnosis not present

## 2019-02-08 DIAGNOSIS — Z9911 Dependence on respirator [ventilator] status: Secondary | ICD-10-CM | POA: Diagnosis not present

## 2019-02-08 DIAGNOSIS — G6289 Other specified polyneuropathies: Secondary | ICD-10-CM | POA: Diagnosis not present

## 2019-02-08 DIAGNOSIS — I959 Hypotension, unspecified: Secondary | ICD-10-CM | POA: Diagnosis not present

## 2019-02-09 DIAGNOSIS — G610 Guillain-Barre syndrome: Secondary | ICD-10-CM | POA: Diagnosis not present

## 2019-02-09 DIAGNOSIS — Z992 Dependence on renal dialysis: Secondary | ICD-10-CM | POA: Diagnosis not present

## 2019-02-09 DIAGNOSIS — N186 End stage renal disease: Secondary | ICD-10-CM | POA: Diagnosis not present

## 2019-02-09 DIAGNOSIS — Z9911 Dependence on respirator [ventilator] status: Secondary | ICD-10-CM | POA: Diagnosis not present

## 2019-02-09 DIAGNOSIS — J9601 Acute respiratory failure with hypoxia: Secondary | ICD-10-CM | POA: Diagnosis not present

## 2019-02-09 DIAGNOSIS — R29898 Other symptoms and signs involving the musculoskeletal system: Secondary | ICD-10-CM | POA: Diagnosis not present

## 2019-02-09 DIAGNOSIS — I959 Hypotension, unspecified: Secondary | ICD-10-CM | POA: Diagnosis not present

## 2019-02-09 DIAGNOSIS — G6289 Other specified polyneuropathies: Secondary | ICD-10-CM | POA: Diagnosis not present

## 2019-02-09 DIAGNOSIS — R579 Shock, unspecified: Secondary | ICD-10-CM | POA: Diagnosis not present

## 2019-02-09 DIAGNOSIS — I5033 Acute on chronic diastolic (congestive) heart failure: Secondary | ICD-10-CM | POA: Diagnosis not present

## 2019-02-09 DIAGNOSIS — R202 Paresthesia of skin: Secondary | ICD-10-CM | POA: Diagnosis not present

## 2019-02-09 DIAGNOSIS — I120 Hypertensive chronic kidney disease with stage 5 chronic kidney disease or end stage renal disease: Secondary | ICD-10-CM | POA: Diagnosis not present

## 2019-02-09 DIAGNOSIS — R200 Anesthesia of skin: Secondary | ICD-10-CM | POA: Diagnosis not present

## 2019-02-10 DIAGNOSIS — R202 Paresthesia of skin: Secondary | ICD-10-CM | POA: Diagnosis not present

## 2019-02-10 DIAGNOSIS — Z4682 Encounter for fitting and adjustment of non-vascular catheter: Secondary | ICD-10-CM | POA: Diagnosis not present

## 2019-02-10 DIAGNOSIS — Z9911 Dependence on respirator [ventilator] status: Secondary | ICD-10-CM | POA: Diagnosis not present

## 2019-02-10 DIAGNOSIS — R200 Anesthesia of skin: Secondary | ICD-10-CM | POA: Diagnosis not present

## 2019-02-10 DIAGNOSIS — E1122 Type 2 diabetes mellitus with diabetic chronic kidney disease: Secondary | ICD-10-CM | POA: Diagnosis not present

## 2019-02-10 DIAGNOSIS — I959 Hypotension, unspecified: Secondary | ICD-10-CM | POA: Diagnosis not present

## 2019-02-10 DIAGNOSIS — G6289 Other specified polyneuropathies: Secondary | ICD-10-CM | POA: Diagnosis not present

## 2019-02-10 DIAGNOSIS — N186 End stage renal disease: Secondary | ICD-10-CM | POA: Diagnosis not present

## 2019-02-10 DIAGNOSIS — J9601 Acute respiratory failure with hypoxia: Secondary | ICD-10-CM | POA: Diagnosis not present

## 2019-02-10 DIAGNOSIS — Z992 Dependence on renal dialysis: Secondary | ICD-10-CM | POA: Diagnosis not present

## 2019-02-10 DIAGNOSIS — R29898 Other symptoms and signs involving the musculoskeletal system: Secondary | ICD-10-CM | POA: Diagnosis not present

## 2019-02-10 DIAGNOSIS — R0602 Shortness of breath: Secondary | ICD-10-CM | POA: Diagnosis not present

## 2019-02-10 DIAGNOSIS — E1165 Type 2 diabetes mellitus with hyperglycemia: Secondary | ICD-10-CM | POA: Diagnosis not present

## 2019-02-10 DIAGNOSIS — I5030 Unspecified diastolic (congestive) heart failure: Secondary | ICD-10-CM | POA: Diagnosis not present

## 2019-02-11 DIAGNOSIS — Z9911 Dependence on respirator [ventilator] status: Secondary | ICD-10-CM | POA: Diagnosis not present

## 2019-02-11 DIAGNOSIS — R202 Paresthesia of skin: Secondary | ICD-10-CM | POA: Diagnosis not present

## 2019-02-11 DIAGNOSIS — I959 Hypotension, unspecified: Secondary | ICD-10-CM | POA: Diagnosis not present

## 2019-02-11 DIAGNOSIS — I5030 Unspecified diastolic (congestive) heart failure: Secondary | ICD-10-CM | POA: Diagnosis not present

## 2019-02-11 DIAGNOSIS — M6281 Muscle weakness (generalized): Secondary | ICD-10-CM | POA: Diagnosis not present

## 2019-02-11 DIAGNOSIS — E1165 Type 2 diabetes mellitus with hyperglycemia: Secondary | ICD-10-CM | POA: Diagnosis not present

## 2019-02-11 DIAGNOSIS — R29898 Other symptoms and signs involving the musculoskeletal system: Secondary | ICD-10-CM | POA: Diagnosis not present

## 2019-02-11 DIAGNOSIS — J9601 Acute respiratory failure with hypoxia: Secondary | ICD-10-CM | POA: Diagnosis not present

## 2019-02-11 DIAGNOSIS — I120 Hypertensive chronic kidney disease with stage 5 chronic kidney disease or end stage renal disease: Secondary | ICD-10-CM | POA: Diagnosis not present

## 2019-02-11 DIAGNOSIS — G6289 Other specified polyneuropathies: Secondary | ICD-10-CM | POA: Diagnosis not present

## 2019-02-11 DIAGNOSIS — R200 Anesthesia of skin: Secondary | ICD-10-CM | POA: Diagnosis not present

## 2019-02-11 DIAGNOSIS — G610 Guillain-Barre syndrome: Secondary | ICD-10-CM | POA: Diagnosis not present

## 2019-02-12 DIAGNOSIS — E1122 Type 2 diabetes mellitus with diabetic chronic kidney disease: Secondary | ICD-10-CM | POA: Diagnosis not present

## 2019-02-12 DIAGNOSIS — R29898 Other symptoms and signs involving the musculoskeletal system: Secondary | ICD-10-CM | POA: Diagnosis not present

## 2019-02-12 DIAGNOSIS — R0902 Hypoxemia: Secondary | ICD-10-CM | POA: Diagnosis not present

## 2019-02-12 DIAGNOSIS — E1165 Type 2 diabetes mellitus with hyperglycemia: Secondary | ICD-10-CM | POA: Diagnosis not present

## 2019-02-12 DIAGNOSIS — R918 Other nonspecific abnormal finding of lung field: Secondary | ICD-10-CM | POA: Diagnosis not present

## 2019-02-12 DIAGNOSIS — D631 Anemia in chronic kidney disease: Secondary | ICD-10-CM | POA: Diagnosis not present

## 2019-02-12 DIAGNOSIS — N186 End stage renal disease: Secondary | ICD-10-CM | POA: Diagnosis not present

## 2019-02-12 DIAGNOSIS — I5031 Acute diastolic (congestive) heart failure: Secondary | ICD-10-CM | POA: Diagnosis not present

## 2019-02-12 DIAGNOSIS — Z9911 Dependence on respirator [ventilator] status: Secondary | ICD-10-CM | POA: Diagnosis not present

## 2019-02-12 DIAGNOSIS — G6289 Other specified polyneuropathies: Secondary | ICD-10-CM | POA: Diagnosis not present

## 2019-02-12 DIAGNOSIS — Z4682 Encounter for fitting and adjustment of non-vascular catheter: Secondary | ICD-10-CM | POA: Diagnosis not present

## 2019-02-12 DIAGNOSIS — J9601 Acute respiratory failure with hypoxia: Secondary | ICD-10-CM | POA: Diagnosis not present

## 2019-02-12 DIAGNOSIS — I120 Hypertensive chronic kidney disease with stage 5 chronic kidney disease or end stage renal disease: Secondary | ICD-10-CM | POA: Diagnosis not present

## 2019-02-12 DIAGNOSIS — G610 Guillain-Barre syndrome: Secondary | ICD-10-CM | POA: Diagnosis not present

## 2019-02-12 DIAGNOSIS — Z992 Dependence on renal dialysis: Secondary | ICD-10-CM | POA: Diagnosis not present

## 2019-02-12 DIAGNOSIS — R200 Anesthesia of skin: Secondary | ICD-10-CM | POA: Diagnosis not present

## 2019-02-13 DIAGNOSIS — K759 Inflammatory liver disease, unspecified: Secondary | ICD-10-CM | POA: Diagnosis not present

## 2019-02-13 DIAGNOSIS — Z20822 Contact with and (suspected) exposure to covid-19: Secondary | ICD-10-CM | POA: Diagnosis not present

## 2019-02-13 DIAGNOSIS — R1310 Dysphagia, unspecified: Secondary | ICD-10-CM | POA: Diagnosis not present

## 2019-02-13 DIAGNOSIS — E1122 Type 2 diabetes mellitus with diabetic chronic kidney disease: Secondary | ICD-10-CM | POA: Diagnosis not present

## 2019-02-13 DIAGNOSIS — E1140 Type 2 diabetes mellitus with diabetic neuropathy, unspecified: Secondary | ICD-10-CM | POA: Diagnosis not present

## 2019-02-13 DIAGNOSIS — Z931 Gastrostomy status: Secondary | ICD-10-CM | POA: Diagnosis not present

## 2019-02-13 DIAGNOSIS — J690 Pneumonitis due to inhalation of food and vomit: Secondary | ICD-10-CM | POA: Diagnosis not present

## 2019-02-13 DIAGNOSIS — K3189 Other diseases of stomach and duodenum: Secondary | ICD-10-CM | POA: Diagnosis not present

## 2019-02-13 DIAGNOSIS — R062 Wheezing: Secondary | ICD-10-CM | POA: Diagnosis not present

## 2019-02-13 DIAGNOSIS — R6521 Severe sepsis with septic shock: Secondary | ICD-10-CM | POA: Diagnosis not present

## 2019-02-13 DIAGNOSIS — R29898 Other symptoms and signs involving the musculoskeletal system: Secondary | ICD-10-CM | POA: Diagnosis not present

## 2019-02-13 DIAGNOSIS — I161 Hypertensive emergency: Secondary | ICD-10-CM | POA: Diagnosis not present

## 2019-02-13 DIAGNOSIS — Z794 Long term (current) use of insulin: Secondary | ICD-10-CM | POA: Diagnosis not present

## 2019-02-13 DIAGNOSIS — R110 Nausea: Secondary | ICD-10-CM | POA: Diagnosis not present

## 2019-02-13 DIAGNOSIS — G43909 Migraine, unspecified, not intractable, without status migrainosus: Secondary | ICD-10-CM | POA: Diagnosis not present

## 2019-02-13 DIAGNOSIS — R519 Headache, unspecified: Secondary | ICD-10-CM | POA: Diagnosis not present

## 2019-02-13 DIAGNOSIS — N186 End stage renal disease: Secondary | ICD-10-CM | POA: Diagnosis not present

## 2019-02-13 DIAGNOSIS — D631 Anemia in chronic kidney disease: Secondary | ICD-10-CM | POA: Diagnosis not present

## 2019-02-13 DIAGNOSIS — G610 Guillain-Barre syndrome: Secondary | ICD-10-CM | POA: Diagnosis not present

## 2019-02-13 DIAGNOSIS — I953 Hypotension of hemodialysis: Secondary | ICD-10-CM | POA: Diagnosis not present

## 2019-02-13 DIAGNOSIS — I959 Hypotension, unspecified: Secondary | ICD-10-CM | POA: Diagnosis not present

## 2019-02-13 DIAGNOSIS — I120 Hypertensive chronic kidney disease with stage 5 chronic kidney disease or end stage renal disease: Secondary | ICD-10-CM | POA: Diagnosis not present

## 2019-02-13 DIAGNOSIS — I5032 Chronic diastolic (congestive) heart failure: Secondary | ICD-10-CM | POA: Diagnosis not present

## 2019-02-13 DIAGNOSIS — J449 Chronic obstructive pulmonary disease, unspecified: Secondary | ICD-10-CM | POA: Diagnosis not present

## 2019-02-13 DIAGNOSIS — R109 Unspecified abdominal pain: Secondary | ICD-10-CM | POA: Diagnosis not present

## 2019-02-13 DIAGNOSIS — G6289 Other specified polyneuropathies: Secondary | ICD-10-CM | POA: Diagnosis not present

## 2019-02-13 DIAGNOSIS — I5033 Acute on chronic diastolic (congestive) heart failure: Secondary | ICD-10-CM | POA: Diagnosis not present

## 2019-02-13 DIAGNOSIS — R633 Feeding difficulties: Secondary | ICD-10-CM | POA: Diagnosis not present

## 2019-02-13 DIAGNOSIS — E1165 Type 2 diabetes mellitus with hyperglycemia: Secondary | ICD-10-CM | POA: Diagnosis not present

## 2019-02-13 DIAGNOSIS — J9601 Acute respiratory failure with hypoxia: Secondary | ICD-10-CM | POA: Diagnosis not present

## 2019-02-13 DIAGNOSIS — R42 Dizziness and giddiness: Secondary | ICD-10-CM | POA: Diagnosis not present

## 2019-02-13 DIAGNOSIS — M6281 Muscle weakness (generalized): Secondary | ICD-10-CM | POA: Diagnosis not present

## 2019-02-13 DIAGNOSIS — I5030 Unspecified diastolic (congestive) heart failure: Secondary | ICD-10-CM | POA: Diagnosis not present

## 2019-02-13 DIAGNOSIS — R410 Disorientation, unspecified: Secondary | ICD-10-CM | POA: Diagnosis not present

## 2019-02-13 DIAGNOSIS — Z431 Encounter for attention to gastrostomy: Secondary | ICD-10-CM | POA: Diagnosis not present

## 2019-02-13 DIAGNOSIS — N390 Urinary tract infection, site not specified: Secondary | ICD-10-CM | POA: Diagnosis not present

## 2019-02-13 DIAGNOSIS — K5900 Constipation, unspecified: Secondary | ICD-10-CM | POA: Diagnosis not present

## 2019-02-13 DIAGNOSIS — Z992 Dependence on renal dialysis: Secondary | ICD-10-CM | POA: Diagnosis not present

## 2019-02-13 DIAGNOSIS — R531 Weakness: Secondary | ICD-10-CM | POA: Diagnosis not present

## 2019-02-13 DIAGNOSIS — K567 Ileus, unspecified: Secondary | ICD-10-CM | POA: Diagnosis not present

## 2019-02-13 DIAGNOSIS — R200 Anesthesia of skin: Secondary | ICD-10-CM | POA: Diagnosis not present

## 2019-02-13 DIAGNOSIS — I4891 Unspecified atrial fibrillation: Secondary | ICD-10-CM | POA: Diagnosis not present

## 2019-02-13 DIAGNOSIS — I132 Hypertensive heart and chronic kidney disease with heart failure and with stage 5 chronic kidney disease, or end stage renal disease: Secondary | ICD-10-CM | POA: Diagnosis not present

## 2019-02-13 DIAGNOSIS — R9389 Abnormal findings on diagnostic imaging of other specified body structures: Secondary | ICD-10-CM | POA: Diagnosis not present

## 2019-02-13 DIAGNOSIS — E1142 Type 2 diabetes mellitus with diabetic polyneuropathy: Secondary | ICD-10-CM | POA: Diagnosis not present

## 2019-02-14 DIAGNOSIS — N186 End stage renal disease: Secondary | ICD-10-CM | POA: Diagnosis not present

## 2019-02-14 DIAGNOSIS — E1122 Type 2 diabetes mellitus with diabetic chronic kidney disease: Secondary | ICD-10-CM | POA: Diagnosis not present

## 2019-02-14 DIAGNOSIS — G6289 Other specified polyneuropathies: Secondary | ICD-10-CM | POA: Diagnosis not present

## 2019-02-14 DIAGNOSIS — I120 Hypertensive chronic kidney disease with stage 5 chronic kidney disease or end stage renal disease: Secondary | ICD-10-CM | POA: Diagnosis not present

## 2019-02-14 DIAGNOSIS — I959 Hypotension, unspecified: Secondary | ICD-10-CM | POA: Diagnosis not present

## 2019-02-18 DIAGNOSIS — I5033 Acute on chronic diastolic (congestive) heart failure: Secondary | ICD-10-CM | POA: Diagnosis not present

## 2019-02-18 DIAGNOSIS — E1140 Type 2 diabetes mellitus with diabetic neuropathy, unspecified: Secondary | ICD-10-CM | POA: Diagnosis not present

## 2019-02-18 DIAGNOSIS — R062 Wheezing: Secondary | ICD-10-CM | POA: Diagnosis not present

## 2019-03-11 DIAGNOSIS — Z931 Gastrostomy status: Secondary | ICD-10-CM | POA: Diagnosis not present

## 2019-03-11 DIAGNOSIS — Z85528 Personal history of other malignant neoplasm of kidney: Secondary | ICD-10-CM | POA: Diagnosis not present

## 2019-03-11 DIAGNOSIS — E0822 Diabetes mellitus due to underlying condition with diabetic chronic kidney disease: Secondary | ICD-10-CM | POA: Diagnosis not present

## 2019-03-11 DIAGNOSIS — T82898A Other specified complication of vascular prosthetic devices, implants and grafts, initial encounter: Secondary | ICD-10-CM | POA: Diagnosis not present

## 2019-03-11 DIAGNOSIS — I4891 Unspecified atrial fibrillation: Secondary | ICD-10-CM | POA: Diagnosis not present

## 2019-03-11 DIAGNOSIS — E1143 Type 2 diabetes mellitus with diabetic autonomic (poly)neuropathy: Secondary | ICD-10-CM | POA: Diagnosis not present

## 2019-03-11 DIAGNOSIS — I120 Hypertensive chronic kidney disease with stage 5 chronic kidney disease or end stage renal disease: Secondary | ICD-10-CM | POA: Diagnosis not present

## 2019-03-11 DIAGNOSIS — N179 Acute kidney failure, unspecified: Secondary | ICD-10-CM | POA: Diagnosis not present

## 2019-03-11 DIAGNOSIS — G6289 Other specified polyneuropathies: Secondary | ICD-10-CM | POA: Diagnosis not present

## 2019-03-11 DIAGNOSIS — Z7982 Long term (current) use of aspirin: Secondary | ICD-10-CM | POA: Diagnosis not present

## 2019-03-11 DIAGNOSIS — E1140 Type 2 diabetes mellitus with diabetic neuropathy, unspecified: Secondary | ICD-10-CM | POA: Diagnosis not present

## 2019-03-11 DIAGNOSIS — N186 End stage renal disease: Secondary | ICD-10-CM | POA: Diagnosis not present

## 2019-03-11 DIAGNOSIS — G1229 Other motor neuron disease: Secondary | ICD-10-CM | POA: Diagnosis not present

## 2019-03-11 DIAGNOSIS — J9610 Chronic respiratory failure, unspecified whether with hypoxia or hypercapnia: Secondary | ICD-10-CM | POA: Diagnosis not present

## 2019-03-11 DIAGNOSIS — N189 Chronic kidney disease, unspecified: Secondary | ICD-10-CM | POA: Diagnosis not present

## 2019-03-11 DIAGNOSIS — K9429 Other complications of gastrostomy: Secondary | ICD-10-CM | POA: Diagnosis not present

## 2019-03-11 DIAGNOSIS — G4730 Sleep apnea, unspecified: Secondary | ICD-10-CM | POA: Diagnosis not present

## 2019-03-11 DIAGNOSIS — K5989 Other specified functional intestinal disorders: Secondary | ICD-10-CM | POA: Diagnosis not present

## 2019-03-11 DIAGNOSIS — K219 Gastro-esophageal reflux disease without esophagitis: Secondary | ICD-10-CM | POA: Diagnosis not present

## 2019-03-11 DIAGNOSIS — K592 Neurogenic bowel, not elsewhere classified: Secondary | ICD-10-CM | POA: Diagnosis not present

## 2019-03-11 DIAGNOSIS — Z8551 Personal history of malignant neoplasm of bladder: Secondary | ICD-10-CM | POA: Diagnosis not present

## 2019-03-11 DIAGNOSIS — R300 Dysuria: Secondary | ICD-10-CM | POA: Diagnosis not present

## 2019-03-11 DIAGNOSIS — Z992 Dependence on renal dialysis: Secondary | ICD-10-CM | POA: Diagnosis not present

## 2019-03-11 DIAGNOSIS — I5030 Unspecified diastolic (congestive) heart failure: Secondary | ICD-10-CM | POA: Diagnosis not present

## 2019-03-11 DIAGNOSIS — G43909 Migraine, unspecified, not intractable, without status migrainosus: Secondary | ICD-10-CM | POA: Diagnosis not present

## 2019-03-11 DIAGNOSIS — R112 Nausea with vomiting, unspecified: Secondary | ICD-10-CM | POA: Diagnosis not present

## 2019-03-11 DIAGNOSIS — I110 Hypertensive heart disease with heart failure: Secondary | ICD-10-CM | POA: Diagnosis not present

## 2019-03-11 DIAGNOSIS — I5033 Acute on chronic diastolic (congestive) heart failure: Secondary | ICD-10-CM | POA: Diagnosis not present

## 2019-03-11 DIAGNOSIS — J449 Chronic obstructive pulmonary disease, unspecified: Secondary | ICD-10-CM | POA: Diagnosis not present

## 2019-03-11 DIAGNOSIS — Z905 Acquired absence of kidney: Secondary | ICD-10-CM | POA: Diagnosis not present

## 2019-03-11 DIAGNOSIS — E1122 Type 2 diabetes mellitus with diabetic chronic kidney disease: Secondary | ICD-10-CM | POA: Diagnosis not present

## 2019-03-11 DIAGNOSIS — J9691 Respiratory failure, unspecified with hypoxia: Secondary | ICD-10-CM | POA: Diagnosis not present

## 2019-03-11 DIAGNOSIS — G610 Guillain-Barre syndrome: Secondary | ICD-10-CM | POA: Diagnosis not present

## 2019-03-11 DIAGNOSIS — N390 Urinary tract infection, site not specified: Secondary | ICD-10-CM | POA: Diagnosis not present

## 2019-03-11 DIAGNOSIS — R1310 Dysphagia, unspecified: Secondary | ICD-10-CM | POA: Diagnosis not present

## 2019-03-11 DIAGNOSIS — R0602 Shortness of breath: Secondary | ICD-10-CM | POA: Diagnosis not present

## 2019-03-11 DIAGNOSIS — R82998 Other abnormal findings in urine: Secondary | ICD-10-CM | POA: Diagnosis not present

## 2019-03-11 DIAGNOSIS — R062 Wheezing: Secondary | ICD-10-CM | POA: Diagnosis not present

## 2019-03-11 DIAGNOSIS — D631 Anemia in chronic kidney disease: Secondary | ICD-10-CM | POA: Diagnosis not present

## 2019-03-11 DIAGNOSIS — Z9049 Acquired absence of other specified parts of digestive tract: Secondary | ICD-10-CM | POA: Diagnosis not present

## 2019-03-11 DIAGNOSIS — D638 Anemia in other chronic diseases classified elsewhere: Secondary | ICD-10-CM | POA: Diagnosis not present

## 2019-04-01 ENCOUNTER — Other Ambulatory Visit: Payer: Self-pay

## 2019-04-01 NOTE — Patient Outreach (Signed)
Nicole Strong) Care Management  04/01/2019  Nicole Strong Feb 02, 1951 [id number]   Medication Adherence call to Mrs. Nicole Strong HIPPA Compliant Voice message left with a call back number. Nicole Strong is showing past due on Rosuvastatin 5 mg under [place].   [facility] Management Direct Dial [phone]  Fax [phone] [email or web address]

## 2019-04-02 DIAGNOSIS — E1122 Type 2 diabetes mellitus with diabetic chronic kidney disease: Secondary | ICD-10-CM | POA: Diagnosis not present

## 2019-04-02 DIAGNOSIS — Z992 Dependence on renal dialysis: Secondary | ICD-10-CM | POA: Diagnosis not present

## 2019-04-02 DIAGNOSIS — N186 End stage renal disease: Secondary | ICD-10-CM | POA: Diagnosis not present

## 2019-04-03 DIAGNOSIS — Z992 Dependence on renal dialysis: Secondary | ICD-10-CM | POA: Diagnosis not present

## 2019-04-03 DIAGNOSIS — N2581 Secondary hyperparathyroidism of renal origin: Secondary | ICD-10-CM | POA: Diagnosis not present

## 2019-04-03 DIAGNOSIS — E1122 Type 2 diabetes mellitus with diabetic chronic kidney disease: Secondary | ICD-10-CM | POA: Diagnosis not present

## 2019-04-03 DIAGNOSIS — D631 Anemia in chronic kidney disease: Secondary | ICD-10-CM | POA: Diagnosis not present

## 2019-04-03 DIAGNOSIS — N186 End stage renal disease: Secondary | ICD-10-CM | POA: Diagnosis not present

## 2019-04-03 DIAGNOSIS — D689 Coagulation defect, unspecified: Secondary | ICD-10-CM | POA: Diagnosis not present

## 2019-04-03 DIAGNOSIS — Z4931 Encounter for adequacy testing for hemodialysis: Secondary | ICD-10-CM | POA: Diagnosis not present

## 2019-04-04 DIAGNOSIS — N186 End stage renal disease: Secondary | ICD-10-CM | POA: Diagnosis not present

## 2019-04-04 DIAGNOSIS — E1122 Type 2 diabetes mellitus with diabetic chronic kidney disease: Secondary | ICD-10-CM | POA: Diagnosis not present

## 2019-04-04 DIAGNOSIS — Z992 Dependence on renal dialysis: Secondary | ICD-10-CM | POA: Diagnosis not present

## 2019-04-04 IMAGING — US US RENAL
1 series · 14 of 25 positions shown · non-contrast
Comparison: 10/11/2016 CT and 04/12/2014 MR

CLINICAL DATA: 66-year-old female with acute on chronic renal
failure. History of left nephrectomy.

EXAM:
RENAL / URINARY TRACT ULTRASOUND COMPLETE

[Series 1: us renal · 0.30mm/px · 14 of 25 slices shown]
[im 1/25]
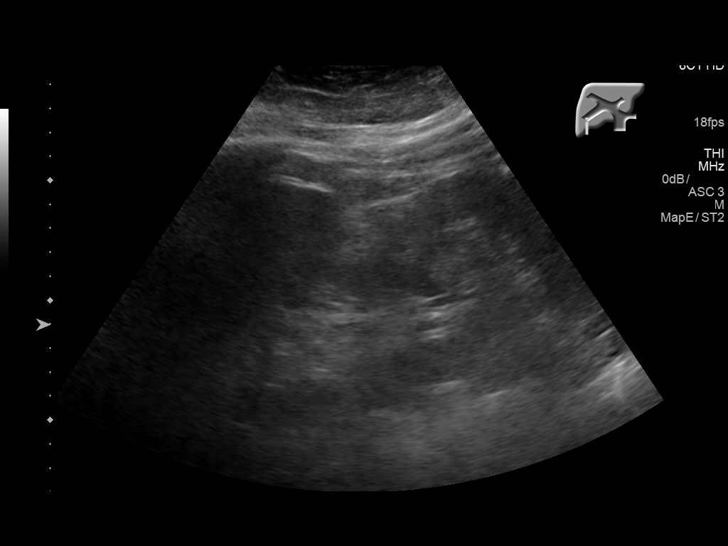
[im 3/25]
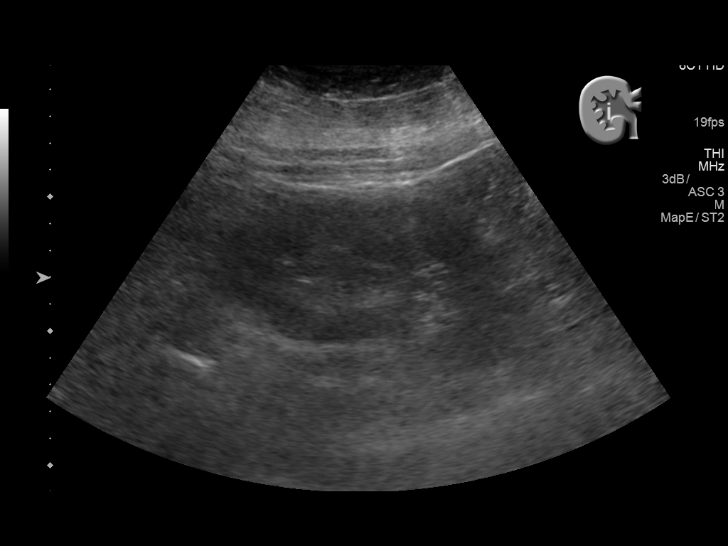
[im 5/25]
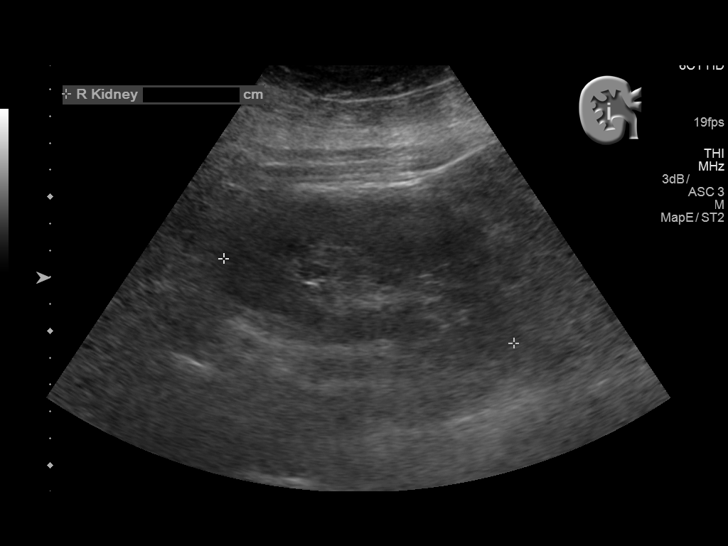
[im 7/25]
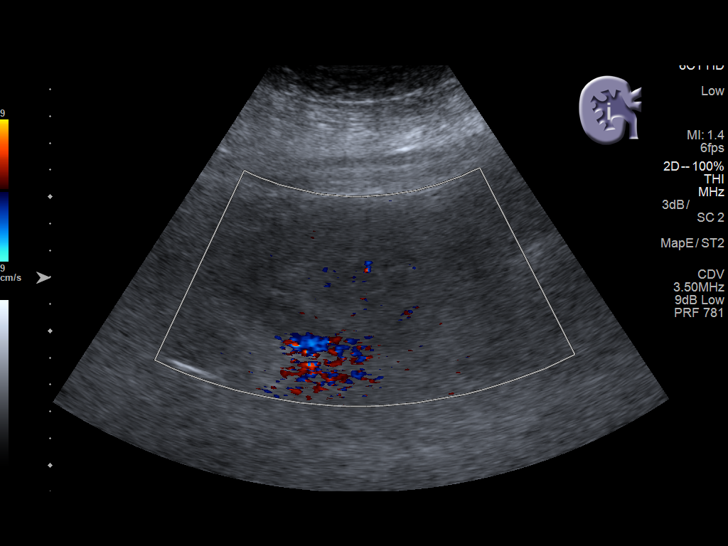
[im 9/25]
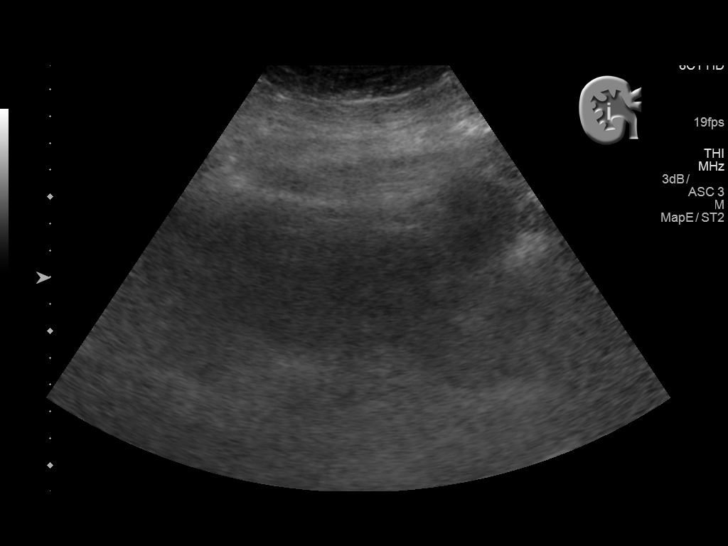
[im 10/25]
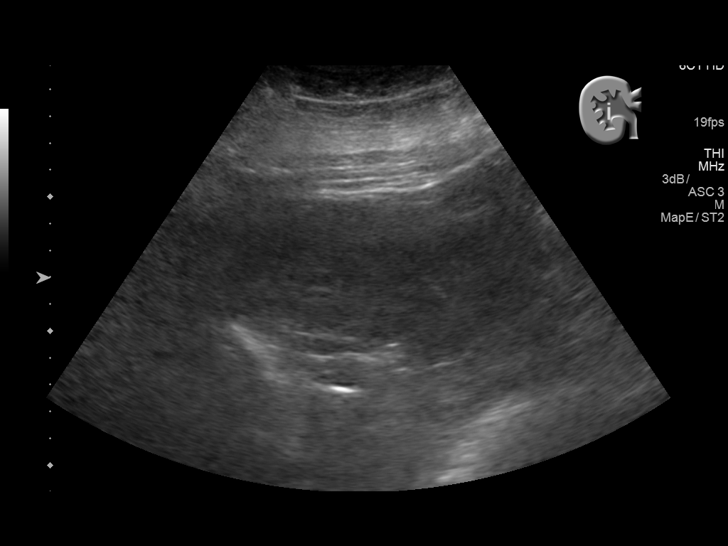
[im 12/25]
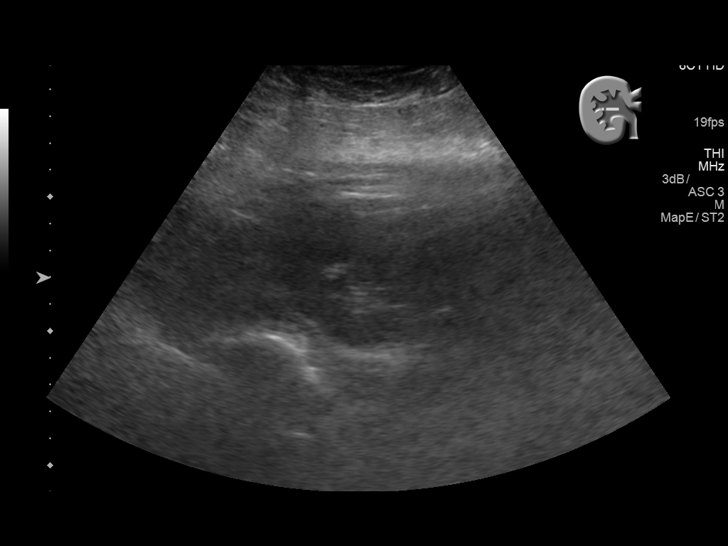
[im 14/25]
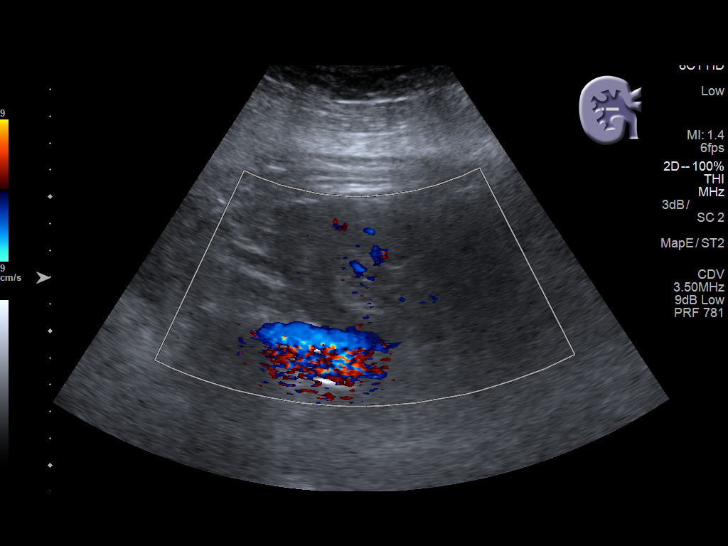
[im 16/25]
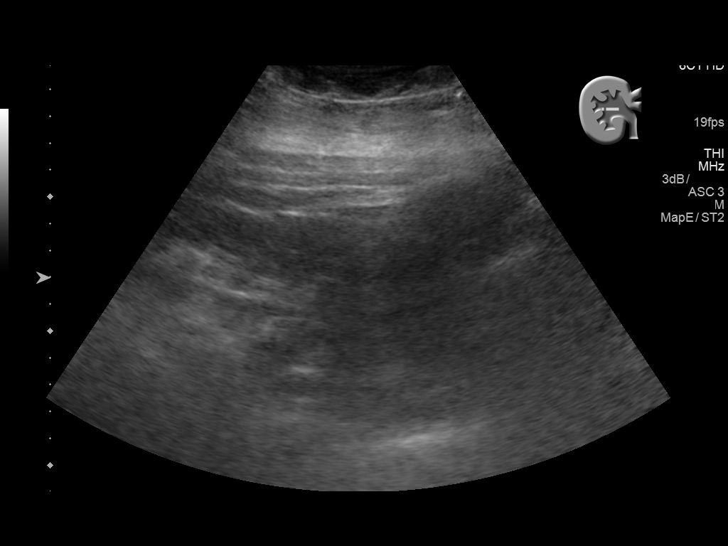
[im 17/25]
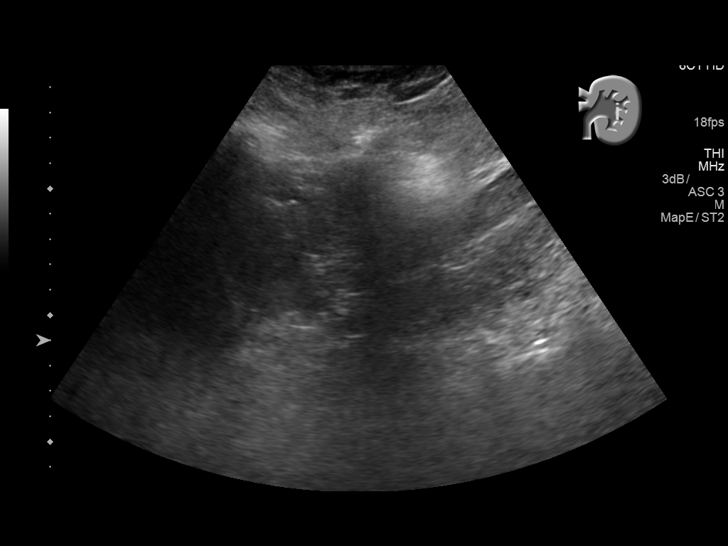
[im 19/25]
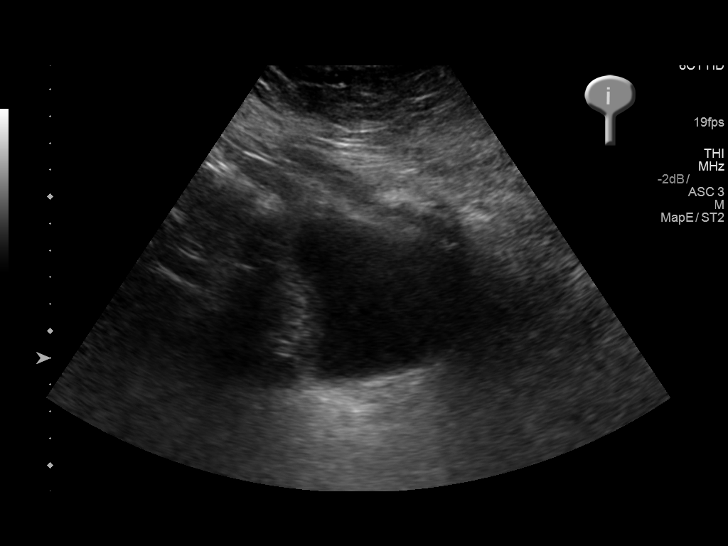
[im 21/25]
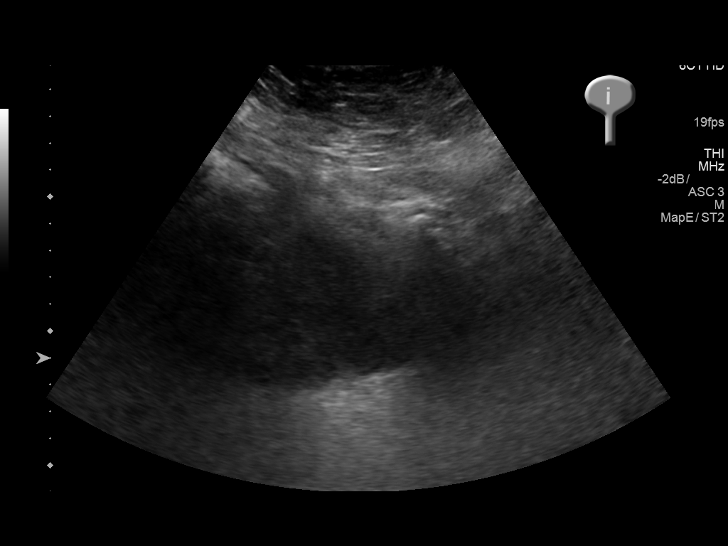
[im 23/25]
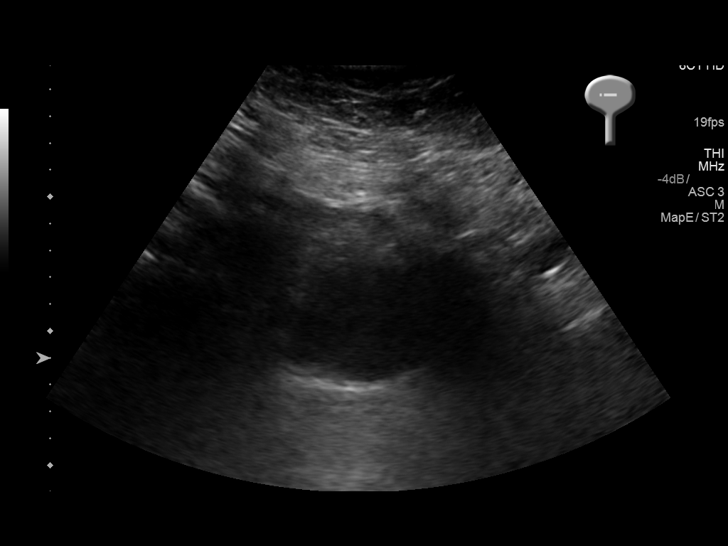
[im 25/25]
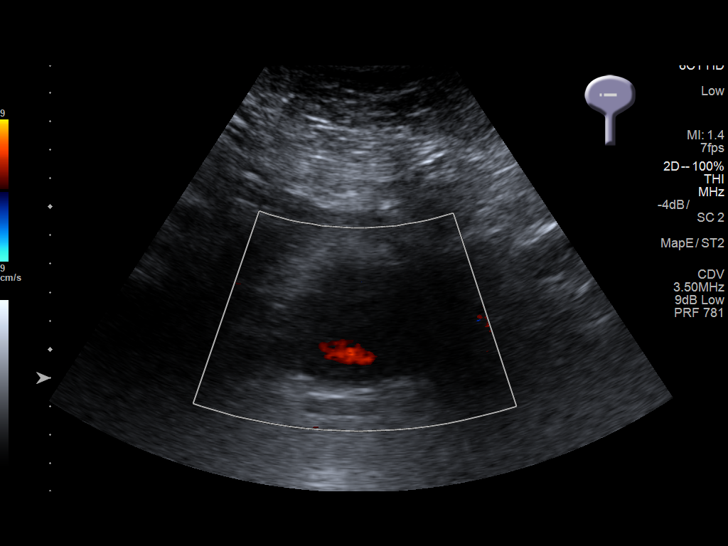

[14 of 25 positions shown; findings below may reference images not displayed]

FINDINGS: Right Kidney:

Length: 11.3 cm. Echogenicity within normal limits. No mass or
hydronephrosis visualized.

Left Kidney:

Not visualized compatible with nephrectomy.

Bladder:

Appears normal for degree of bladder distention.
IMPRESSION: 1. Unremarkable right kidney.
2. Status post left nephrectomy.

## 2019-04-05 DIAGNOSIS — Z992 Dependence on renal dialysis: Secondary | ICD-10-CM | POA: Diagnosis not present

## 2019-04-05 DIAGNOSIS — E1122 Type 2 diabetes mellitus with diabetic chronic kidney disease: Secondary | ICD-10-CM | POA: Diagnosis not present

## 2019-04-05 DIAGNOSIS — N186 End stage renal disease: Secondary | ICD-10-CM | POA: Diagnosis not present

## 2019-04-06 DIAGNOSIS — Z4931 Encounter for adequacy testing for hemodialysis: Secondary | ICD-10-CM | POA: Diagnosis not present

## 2019-04-06 DIAGNOSIS — E1122 Type 2 diabetes mellitus with diabetic chronic kidney disease: Secondary | ICD-10-CM | POA: Diagnosis not present

## 2019-04-06 DIAGNOSIS — D689 Coagulation defect, unspecified: Secondary | ICD-10-CM | POA: Diagnosis not present

## 2019-04-06 DIAGNOSIS — D631 Anemia in chronic kidney disease: Secondary | ICD-10-CM | POA: Diagnosis not present

## 2019-04-06 DIAGNOSIS — N2581 Secondary hyperparathyroidism of renal origin: Secondary | ICD-10-CM | POA: Diagnosis not present

## 2019-04-06 DIAGNOSIS — Z992 Dependence on renal dialysis: Secondary | ICD-10-CM | POA: Diagnosis not present

## 2019-04-06 DIAGNOSIS — N186 End stage renal disease: Secondary | ICD-10-CM | POA: Diagnosis not present

## 2019-04-07 DIAGNOSIS — E1122 Type 2 diabetes mellitus with diabetic chronic kidney disease: Secondary | ICD-10-CM | POA: Diagnosis not present

## 2019-04-07 DIAGNOSIS — Z992 Dependence on renal dialysis: Secondary | ICD-10-CM | POA: Diagnosis not present

## 2019-04-07 DIAGNOSIS — N186 End stage renal disease: Secondary | ICD-10-CM | POA: Diagnosis not present

## 2019-04-08 DIAGNOSIS — Z4931 Encounter for adequacy testing for hemodialysis: Secondary | ICD-10-CM | POA: Diagnosis not present

## 2019-04-08 DIAGNOSIS — D631 Anemia in chronic kidney disease: Secondary | ICD-10-CM | POA: Diagnosis not present

## 2019-04-08 DIAGNOSIS — D689 Coagulation defect, unspecified: Secondary | ICD-10-CM | POA: Diagnosis not present

## 2019-04-08 DIAGNOSIS — E1122 Type 2 diabetes mellitus with diabetic chronic kidney disease: Secondary | ICD-10-CM | POA: Diagnosis not present

## 2019-04-08 DIAGNOSIS — N2581 Secondary hyperparathyroidism of renal origin: Secondary | ICD-10-CM | POA: Diagnosis not present

## 2019-04-08 DIAGNOSIS — N186 End stage renal disease: Secondary | ICD-10-CM | POA: Diagnosis not present

## 2019-04-08 DIAGNOSIS — Z992 Dependence on renal dialysis: Secondary | ICD-10-CM | POA: Diagnosis not present

## 2019-04-09 DIAGNOSIS — Z992 Dependence on renal dialysis: Secondary | ICD-10-CM | POA: Diagnosis not present

## 2019-04-09 DIAGNOSIS — E1122 Type 2 diabetes mellitus with diabetic chronic kidney disease: Secondary | ICD-10-CM | POA: Diagnosis not present

## 2019-04-09 DIAGNOSIS — I10 Essential (primary) hypertension: Secondary | ICD-10-CM | POA: Diagnosis not present

## 2019-04-09 DIAGNOSIS — N186 End stage renal disease: Secondary | ICD-10-CM | POA: Diagnosis not present

## 2019-04-09 DIAGNOSIS — G6189 Other inflammatory polyneuropathies: Secondary | ICD-10-CM | POA: Diagnosis not present

## 2019-04-09 DIAGNOSIS — E119 Type 2 diabetes mellitus without complications: Secondary | ICD-10-CM | POA: Diagnosis not present

## 2019-04-09 DIAGNOSIS — G6289 Other specified polyneuropathies: Secondary | ICD-10-CM | POA: Diagnosis not present

## 2019-04-09 DIAGNOSIS — G1222 Progressive bulbar palsy: Secondary | ICD-10-CM | POA: Diagnosis not present

## 2019-04-09 DIAGNOSIS — J9621 Acute and chronic respiratory failure with hypoxia: Secondary | ICD-10-CM | POA: Diagnosis not present

## 2019-04-09 DIAGNOSIS — I509 Heart failure, unspecified: Secondary | ICD-10-CM | POA: Diagnosis not present

## 2019-04-09 DIAGNOSIS — H65491 Other chronic nonsuppurative otitis media, right ear: Secondary | ICD-10-CM | POA: Diagnosis not present

## 2019-04-10 DIAGNOSIS — D689 Coagulation defect, unspecified: Secondary | ICD-10-CM | POA: Diagnosis not present

## 2019-04-10 DIAGNOSIS — D631 Anemia in chronic kidney disease: Secondary | ICD-10-CM | POA: Diagnosis not present

## 2019-04-10 DIAGNOSIS — N2581 Secondary hyperparathyroidism of renal origin: Secondary | ICD-10-CM | POA: Diagnosis not present

## 2019-04-10 DIAGNOSIS — N186 End stage renal disease: Secondary | ICD-10-CM | POA: Diagnosis not present

## 2019-04-10 DIAGNOSIS — Z4931 Encounter for adequacy testing for hemodialysis: Secondary | ICD-10-CM | POA: Diagnosis not present

## 2019-04-10 DIAGNOSIS — Z992 Dependence on renal dialysis: Secondary | ICD-10-CM | POA: Diagnosis not present

## 2019-04-10 DIAGNOSIS — E1122 Type 2 diabetes mellitus with diabetic chronic kidney disease: Secondary | ICD-10-CM | POA: Diagnosis not present

## 2019-04-11 DIAGNOSIS — Z992 Dependence on renal dialysis: Secondary | ICD-10-CM | POA: Diagnosis not present

## 2019-04-11 DIAGNOSIS — N186 End stage renal disease: Secondary | ICD-10-CM | POA: Diagnosis not present

## 2019-04-11 DIAGNOSIS — E1122 Type 2 diabetes mellitus with diabetic chronic kidney disease: Secondary | ICD-10-CM | POA: Diagnosis not present

## 2019-04-12 DIAGNOSIS — Z992 Dependence on renal dialysis: Secondary | ICD-10-CM | POA: Diagnosis not present

## 2019-04-12 DIAGNOSIS — E1122 Type 2 diabetes mellitus with diabetic chronic kidney disease: Secondary | ICD-10-CM | POA: Diagnosis not present

## 2019-04-12 DIAGNOSIS — N186 End stage renal disease: Secondary | ICD-10-CM | POA: Diagnosis not present

## 2019-04-13 DIAGNOSIS — Z4931 Encounter for adequacy testing for hemodialysis: Secondary | ICD-10-CM | POA: Diagnosis not present

## 2019-04-13 DIAGNOSIS — D631 Anemia in chronic kidney disease: Secondary | ICD-10-CM | POA: Diagnosis not present

## 2019-04-13 DIAGNOSIS — D689 Coagulation defect, unspecified: Secondary | ICD-10-CM | POA: Diagnosis not present

## 2019-04-13 DIAGNOSIS — S41102A Unspecified open wound of left upper arm, initial encounter: Secondary | ICD-10-CM | POA: Diagnosis not present

## 2019-04-13 DIAGNOSIS — Z992 Dependence on renal dialysis: Secondary | ICD-10-CM | POA: Diagnosis not present

## 2019-04-13 DIAGNOSIS — N186 End stage renal disease: Secondary | ICD-10-CM | POA: Diagnosis not present

## 2019-04-13 DIAGNOSIS — N2581 Secondary hyperparathyroidism of renal origin: Secondary | ICD-10-CM | POA: Diagnosis not present

## 2019-04-15 DIAGNOSIS — D689 Coagulation defect, unspecified: Secondary | ICD-10-CM | POA: Diagnosis not present

## 2019-04-15 DIAGNOSIS — Z4931 Encounter for adequacy testing for hemodialysis: Secondary | ICD-10-CM | POA: Diagnosis not present

## 2019-04-15 DIAGNOSIS — D631 Anemia in chronic kidney disease: Secondary | ICD-10-CM | POA: Diagnosis not present

## 2019-04-15 DIAGNOSIS — N2581 Secondary hyperparathyroidism of renal origin: Secondary | ICD-10-CM | POA: Diagnosis not present

## 2019-04-15 DIAGNOSIS — S41102A Unspecified open wound of left upper arm, initial encounter: Secondary | ICD-10-CM | POA: Diagnosis not present

## 2019-04-15 DIAGNOSIS — N186 End stage renal disease: Secondary | ICD-10-CM | POA: Diagnosis not present

## 2019-04-15 DIAGNOSIS — Z992 Dependence on renal dialysis: Secondary | ICD-10-CM | POA: Diagnosis not present

## 2019-04-16 DIAGNOSIS — I5030 Unspecified diastolic (congestive) heart failure: Secondary | ICD-10-CM | POA: Diagnosis not present

## 2019-04-16 DIAGNOSIS — I132 Hypertensive heart and chronic kidney disease with heart failure and with stage 5 chronic kidney disease, or end stage renal disease: Secondary | ICD-10-CM | POA: Diagnosis not present

## 2019-04-16 DIAGNOSIS — G6189 Other inflammatory polyneuropathies: Secondary | ICD-10-CM | POA: Diagnosis not present

## 2019-04-16 DIAGNOSIS — E1122 Type 2 diabetes mellitus with diabetic chronic kidney disease: Secondary | ICD-10-CM | POA: Diagnosis not present

## 2019-04-16 DIAGNOSIS — G6289 Other specified polyneuropathies: Secondary | ICD-10-CM | POA: Diagnosis not present

## 2019-04-16 DIAGNOSIS — G1222 Progressive bulbar palsy: Secondary | ICD-10-CM | POA: Diagnosis not present

## 2019-04-16 DIAGNOSIS — R1310 Dysphagia, unspecified: Secondary | ICD-10-CM | POA: Diagnosis not present

## 2019-04-17 DIAGNOSIS — D689 Coagulation defect, unspecified: Secondary | ICD-10-CM | POA: Diagnosis not present

## 2019-04-17 DIAGNOSIS — Z992 Dependence on renal dialysis: Secondary | ICD-10-CM | POA: Diagnosis not present

## 2019-04-17 DIAGNOSIS — N186 End stage renal disease: Secondary | ICD-10-CM | POA: Diagnosis not present

## 2019-04-17 DIAGNOSIS — S41102A Unspecified open wound of left upper arm, initial encounter: Secondary | ICD-10-CM | POA: Diagnosis not present

## 2019-04-17 DIAGNOSIS — N2581 Secondary hyperparathyroidism of renal origin: Secondary | ICD-10-CM | POA: Diagnosis not present

## 2019-04-17 DIAGNOSIS — Z4931 Encounter for adequacy testing for hemodialysis: Secondary | ICD-10-CM | POA: Diagnosis not present

## 2019-04-17 DIAGNOSIS — D631 Anemia in chronic kidney disease: Secondary | ICD-10-CM | POA: Diagnosis not present

## 2019-04-18 DIAGNOSIS — R062 Wheezing: Secondary | ICD-10-CM | POA: Diagnosis not present

## 2019-04-18 DIAGNOSIS — E1140 Type 2 diabetes mellitus with diabetic neuropathy, unspecified: Secondary | ICD-10-CM | POA: Diagnosis not present

## 2019-04-18 DIAGNOSIS — I5033 Acute on chronic diastolic (congestive) heart failure: Secondary | ICD-10-CM | POA: Diagnosis not present

## 2019-04-20 DIAGNOSIS — N2581 Secondary hyperparathyroidism of renal origin: Secondary | ICD-10-CM | POA: Diagnosis not present

## 2019-04-20 DIAGNOSIS — Z4931 Encounter for adequacy testing for hemodialysis: Secondary | ICD-10-CM | POA: Diagnosis not present

## 2019-04-20 DIAGNOSIS — D631 Anemia in chronic kidney disease: Secondary | ICD-10-CM | POA: Diagnosis not present

## 2019-04-20 DIAGNOSIS — N186 End stage renal disease: Secondary | ICD-10-CM | POA: Diagnosis not present

## 2019-04-20 DIAGNOSIS — Z5181 Encounter for therapeutic drug level monitoring: Secondary | ICD-10-CM | POA: Diagnosis not present

## 2019-04-20 DIAGNOSIS — Z992 Dependence on renal dialysis: Secondary | ICD-10-CM | POA: Diagnosis not present

## 2019-04-20 DIAGNOSIS — S41102A Unspecified open wound of left upper arm, initial encounter: Secondary | ICD-10-CM | POA: Diagnosis not present

## 2019-04-20 DIAGNOSIS — D689 Coagulation defect, unspecified: Secondary | ICD-10-CM | POA: Diagnosis not present

## 2019-04-21 DIAGNOSIS — R1310 Dysphagia, unspecified: Secondary | ICD-10-CM | POA: Diagnosis not present

## 2019-04-21 DIAGNOSIS — E1122 Type 2 diabetes mellitus with diabetic chronic kidney disease: Secondary | ICD-10-CM | POA: Diagnosis not present

## 2019-04-21 DIAGNOSIS — I5030 Unspecified diastolic (congestive) heart failure: Secondary | ICD-10-CM | POA: Diagnosis not present

## 2019-04-21 DIAGNOSIS — G1222 Progressive bulbar palsy: Secondary | ICD-10-CM | POA: Diagnosis not present

## 2019-04-21 DIAGNOSIS — I132 Hypertensive heart and chronic kidney disease with heart failure and with stage 5 chronic kidney disease, or end stage renal disease: Secondary | ICD-10-CM | POA: Diagnosis not present

## 2019-04-21 DIAGNOSIS — G6289 Other specified polyneuropathies: Secondary | ICD-10-CM | POA: Diagnosis not present

## 2019-04-22 DIAGNOSIS — N2581 Secondary hyperparathyroidism of renal origin: Secondary | ICD-10-CM | POA: Diagnosis not present

## 2019-04-22 DIAGNOSIS — Z4931 Encounter for adequacy testing for hemodialysis: Secondary | ICD-10-CM | POA: Diagnosis not present

## 2019-04-22 DIAGNOSIS — D631 Anemia in chronic kidney disease: Secondary | ICD-10-CM | POA: Diagnosis not present

## 2019-04-22 DIAGNOSIS — D689 Coagulation defect, unspecified: Secondary | ICD-10-CM | POA: Diagnosis not present

## 2019-04-22 DIAGNOSIS — N186 End stage renal disease: Secondary | ICD-10-CM | POA: Diagnosis not present

## 2019-04-22 DIAGNOSIS — S41102A Unspecified open wound of left upper arm, initial encounter: Secondary | ICD-10-CM | POA: Diagnosis not present

## 2019-04-22 DIAGNOSIS — Z992 Dependence on renal dialysis: Secondary | ICD-10-CM | POA: Diagnosis not present

## 2019-04-23 DIAGNOSIS — E1122 Type 2 diabetes mellitus with diabetic chronic kidney disease: Secondary | ICD-10-CM | POA: Diagnosis not present

## 2019-04-23 DIAGNOSIS — I5030 Unspecified diastolic (congestive) heart failure: Secondary | ICD-10-CM | POA: Diagnosis not present

## 2019-04-23 DIAGNOSIS — I132 Hypertensive heart and chronic kidney disease with heart failure and with stage 5 chronic kidney disease, or end stage renal disease: Secondary | ICD-10-CM | POA: Diagnosis not present

## 2019-04-23 DIAGNOSIS — R1310 Dysphagia, unspecified: Secondary | ICD-10-CM | POA: Diagnosis not present

## 2019-04-23 DIAGNOSIS — G1222 Progressive bulbar palsy: Secondary | ICD-10-CM | POA: Diagnosis not present

## 2019-04-23 DIAGNOSIS — G6289 Other specified polyneuropathies: Secondary | ICD-10-CM | POA: Diagnosis not present

## 2019-04-24 DIAGNOSIS — Z992 Dependence on renal dialysis: Secondary | ICD-10-CM | POA: Diagnosis not present

## 2019-04-24 DIAGNOSIS — N186 End stage renal disease: Secondary | ICD-10-CM | POA: Diagnosis not present

## 2019-04-24 DIAGNOSIS — D689 Coagulation defect, unspecified: Secondary | ICD-10-CM | POA: Diagnosis not present

## 2019-04-24 DIAGNOSIS — Z4931 Encounter for adequacy testing for hemodialysis: Secondary | ICD-10-CM | POA: Diagnosis not present

## 2019-04-24 DIAGNOSIS — N2581 Secondary hyperparathyroidism of renal origin: Secondary | ICD-10-CM | POA: Diagnosis not present

## 2019-04-24 DIAGNOSIS — D631 Anemia in chronic kidney disease: Secondary | ICD-10-CM | POA: Diagnosis not present

## 2019-04-24 DIAGNOSIS — S41102A Unspecified open wound of left upper arm, initial encounter: Secondary | ICD-10-CM | POA: Diagnosis not present

## 2019-04-26 DIAGNOSIS — J9610 Chronic respiratory failure, unspecified whether with hypoxia or hypercapnia: Secondary | ICD-10-CM | POA: Diagnosis not present

## 2019-04-26 DIAGNOSIS — G610 Guillain-Barre syndrome: Secondary | ICD-10-CM | POA: Diagnosis not present

## 2019-04-27 DIAGNOSIS — N186 End stage renal disease: Secondary | ICD-10-CM | POA: Diagnosis not present

## 2019-04-27 DIAGNOSIS — Z992 Dependence on renal dialysis: Secondary | ICD-10-CM | POA: Diagnosis not present

## 2019-04-27 DIAGNOSIS — D631 Anemia in chronic kidney disease: Secondary | ICD-10-CM | POA: Diagnosis not present

## 2019-04-27 DIAGNOSIS — S41102A Unspecified open wound of left upper arm, initial encounter: Secondary | ICD-10-CM | POA: Diagnosis not present

## 2019-04-27 DIAGNOSIS — D689 Coagulation defect, unspecified: Secondary | ICD-10-CM | POA: Diagnosis not present

## 2019-04-27 DIAGNOSIS — N2581 Secondary hyperparathyroidism of renal origin: Secondary | ICD-10-CM | POA: Diagnosis not present

## 2019-04-27 DIAGNOSIS — Z4931 Encounter for adequacy testing for hemodialysis: Secondary | ICD-10-CM | POA: Diagnosis not present

## 2019-04-28 ENCOUNTER — Other Ambulatory Visit: Payer: Self-pay

## 2019-04-28 DIAGNOSIS — G6289 Other specified polyneuropathies: Secondary | ICD-10-CM | POA: Diagnosis not present

## 2019-04-28 DIAGNOSIS — G1222 Progressive bulbar palsy: Secondary | ICD-10-CM | POA: Diagnosis not present

## 2019-04-28 DIAGNOSIS — E1122 Type 2 diabetes mellitus with diabetic chronic kidney disease: Secondary | ICD-10-CM | POA: Diagnosis not present

## 2019-04-28 DIAGNOSIS — I5030 Unspecified diastolic (congestive) heart failure: Secondary | ICD-10-CM | POA: Diagnosis not present

## 2019-04-28 DIAGNOSIS — I132 Hypertensive heart and chronic kidney disease with heart failure and with stage 5 chronic kidney disease, or end stage renal disease: Secondary | ICD-10-CM | POA: Diagnosis not present

## 2019-04-28 DIAGNOSIS — R1310 Dysphagia, unspecified: Secondary | ICD-10-CM | POA: Diagnosis not present

## 2019-04-28 NOTE — Patient Outreach (Signed)
[facility]) Care Management  04/28/2019  Nicole Strong [birth year] [id number]   Medication Adherence call to Nicole Strong HIPPA Compliant Voice message left with a call back number. Nicole Strong is showing past due on Rosuvastatin 5 mg under [place].   [facility] Management Direct Dial [phone]  Fax [phone] [email or web address]

## 2019-04-29 DIAGNOSIS — Z992 Dependence on renal dialysis: Secondary | ICD-10-CM | POA: Diagnosis not present

## 2019-04-29 DIAGNOSIS — S41102A Unspecified open wound of left upper arm, initial encounter: Secondary | ICD-10-CM | POA: Diagnosis not present

## 2019-04-29 DIAGNOSIS — D631 Anemia in chronic kidney disease: Secondary | ICD-10-CM | POA: Diagnosis not present

## 2019-04-29 DIAGNOSIS — N186 End stage renal disease: Secondary | ICD-10-CM | POA: Diagnosis not present

## 2019-04-29 DIAGNOSIS — D689 Coagulation defect, unspecified: Secondary | ICD-10-CM | POA: Diagnosis not present

## 2019-04-29 DIAGNOSIS — Z4931 Encounter for adequacy testing for hemodialysis: Secondary | ICD-10-CM | POA: Diagnosis not present

## 2019-04-29 DIAGNOSIS — N2581 Secondary hyperparathyroidism of renal origin: Secondary | ICD-10-CM | POA: Diagnosis not present

## 2019-04-30 DIAGNOSIS — Z905 Acquired absence of kidney: Secondary | ICD-10-CM | POA: Diagnosis not present

## 2019-04-30 DIAGNOSIS — J449 Chronic obstructive pulmonary disease, unspecified: Secondary | ICD-10-CM | POA: Diagnosis not present

## 2019-04-30 DIAGNOSIS — Z794 Long term (current) use of insulin: Secondary | ICD-10-CM | POA: Diagnosis not present

## 2019-04-30 DIAGNOSIS — Z87891 Personal history of nicotine dependence: Secondary | ICD-10-CM | POA: Diagnosis not present

## 2019-04-30 DIAGNOSIS — E1122 Type 2 diabetes mellitus with diabetic chronic kidney disease: Secondary | ICD-10-CM | POA: Diagnosis not present

## 2019-04-30 DIAGNOSIS — I4891 Unspecified atrial fibrillation: Secondary | ICD-10-CM | POA: Diagnosis not present

## 2019-04-30 DIAGNOSIS — Z79899 Other long term (current) drug therapy: Secondary | ICD-10-CM | POA: Diagnosis not present

## 2019-04-30 DIAGNOSIS — C649 Malignant neoplasm of unspecified kidney, except renal pelvis: Secondary | ICD-10-CM | POA: Diagnosis not present

## 2019-04-30 DIAGNOSIS — R319 Hematuria, unspecified: Secondary | ICD-10-CM | POA: Diagnosis not present

## 2019-04-30 DIAGNOSIS — N186 End stage renal disease: Secondary | ICD-10-CM | POA: Diagnosis not present

## 2019-04-30 DIAGNOSIS — I120 Hypertensive chronic kidney disease with stage 5 chronic kidney disease or end stage renal disease: Secondary | ICD-10-CM | POA: Diagnosis not present

## 2019-04-30 DIAGNOSIS — R300 Dysuria: Secondary | ICD-10-CM | POA: Diagnosis not present

## 2019-04-30 DIAGNOSIS — Z992 Dependence on renal dialysis: Secondary | ICD-10-CM | POA: Diagnosis not present

## 2019-05-01 DIAGNOSIS — Z992 Dependence on renal dialysis: Secondary | ICD-10-CM | POA: Diagnosis not present

## 2019-05-01 DIAGNOSIS — N186 End stage renal disease: Secondary | ICD-10-CM | POA: Diagnosis not present

## 2019-05-01 DIAGNOSIS — Z4931 Encounter for adequacy testing for hemodialysis: Secondary | ICD-10-CM | POA: Diagnosis not present

## 2019-05-01 DIAGNOSIS — N2581 Secondary hyperparathyroidism of renal origin: Secondary | ICD-10-CM | POA: Diagnosis not present

## 2019-05-01 DIAGNOSIS — S41102A Unspecified open wound of left upper arm, initial encounter: Secondary | ICD-10-CM | POA: Diagnosis not present

## 2019-05-01 DIAGNOSIS — D631 Anemia in chronic kidney disease: Secondary | ICD-10-CM | POA: Diagnosis not present

## 2019-05-01 DIAGNOSIS — D689 Coagulation defect, unspecified: Secondary | ICD-10-CM | POA: Diagnosis not present

## 2019-05-02 DIAGNOSIS — E1122 Type 2 diabetes mellitus with diabetic chronic kidney disease: Secondary | ICD-10-CM | POA: Diagnosis not present

## 2019-05-02 DIAGNOSIS — I132 Hypertensive heart and chronic kidney disease with heart failure and with stage 5 chronic kidney disease, or end stage renal disease: Secondary | ICD-10-CM | POA: Diagnosis not present

## 2019-05-02 DIAGNOSIS — R1310 Dysphagia, unspecified: Secondary | ICD-10-CM | POA: Diagnosis not present

## 2019-05-02 DIAGNOSIS — G1222 Progressive bulbar palsy: Secondary | ICD-10-CM | POA: Diagnosis not present

## 2019-05-02 DIAGNOSIS — I5030 Unspecified diastolic (congestive) heart failure: Secondary | ICD-10-CM | POA: Diagnosis not present

## 2019-05-02 DIAGNOSIS — G6289 Other specified polyneuropathies: Secondary | ICD-10-CM | POA: Diagnosis not present

## 2019-05-04 DIAGNOSIS — N2581 Secondary hyperparathyroidism of renal origin: Secondary | ICD-10-CM | POA: Diagnosis not present

## 2019-05-04 DIAGNOSIS — N186 End stage renal disease: Secondary | ICD-10-CM | POA: Diagnosis not present

## 2019-05-04 DIAGNOSIS — S41102A Unspecified open wound of left upper arm, initial encounter: Secondary | ICD-10-CM | POA: Diagnosis not present

## 2019-05-04 DIAGNOSIS — D689 Coagulation defect, unspecified: Secondary | ICD-10-CM | POA: Diagnosis not present

## 2019-05-04 DIAGNOSIS — T8131XA Disruption of external operation (surgical) wound, not elsewhere classified, initial encounter: Secondary | ICD-10-CM | POA: Diagnosis not present

## 2019-05-04 DIAGNOSIS — D631 Anemia in chronic kidney disease: Secondary | ICD-10-CM | POA: Diagnosis not present

## 2019-05-04 DIAGNOSIS — I770 Arteriovenous fistula, acquired: Secondary | ICD-10-CM | POA: Diagnosis not present

## 2019-05-04 DIAGNOSIS — Z992 Dependence on renal dialysis: Secondary | ICD-10-CM | POA: Diagnosis not present

## 2019-05-04 DIAGNOSIS — Z4931 Encounter for adequacy testing for hemodialysis: Secondary | ICD-10-CM | POA: Diagnosis not present

## 2019-05-05 DIAGNOSIS — I5030 Unspecified diastolic (congestive) heart failure: Secondary | ICD-10-CM | POA: Diagnosis not present

## 2019-05-05 DIAGNOSIS — E1122 Type 2 diabetes mellitus with diabetic chronic kidney disease: Secondary | ICD-10-CM | POA: Diagnosis not present

## 2019-05-05 DIAGNOSIS — I132 Hypertensive heart and chronic kidney disease with heart failure and with stage 5 chronic kidney disease, or end stage renal disease: Secondary | ICD-10-CM | POA: Diagnosis not present

## 2019-05-05 DIAGNOSIS — R1310 Dysphagia, unspecified: Secondary | ICD-10-CM | POA: Diagnosis not present

## 2019-05-05 DIAGNOSIS — G1222 Progressive bulbar palsy: Secondary | ICD-10-CM | POA: Diagnosis not present

## 2019-05-05 DIAGNOSIS — G6289 Other specified polyneuropathies: Secondary | ICD-10-CM | POA: Diagnosis not present

## 2019-05-06 DIAGNOSIS — D689 Coagulation defect, unspecified: Secondary | ICD-10-CM | POA: Diagnosis not present

## 2019-05-06 DIAGNOSIS — N186 End stage renal disease: Secondary | ICD-10-CM | POA: Diagnosis not present

## 2019-05-06 DIAGNOSIS — Z4931 Encounter for adequacy testing for hemodialysis: Secondary | ICD-10-CM | POA: Diagnosis not present

## 2019-05-06 DIAGNOSIS — Z992 Dependence on renal dialysis: Secondary | ICD-10-CM | POA: Diagnosis not present

## 2019-05-06 DIAGNOSIS — D631 Anemia in chronic kidney disease: Secondary | ICD-10-CM | POA: Diagnosis not present

## 2019-05-06 DIAGNOSIS — N2581 Secondary hyperparathyroidism of renal origin: Secondary | ICD-10-CM | POA: Diagnosis not present

## 2019-05-06 DIAGNOSIS — S41102A Unspecified open wound of left upper arm, initial encounter: Secondary | ICD-10-CM | POA: Diagnosis not present

## 2019-05-07 DIAGNOSIS — Z794 Long term (current) use of insulin: Secondary | ICD-10-CM | POA: Diagnosis not present

## 2019-05-07 DIAGNOSIS — N182 Chronic kidney disease, stage 2 (mild): Secondary | ICD-10-CM | POA: Diagnosis not present

## 2019-05-07 DIAGNOSIS — E1122 Type 2 diabetes mellitus with diabetic chronic kidney disease: Secondary | ICD-10-CM | POA: Diagnosis not present

## 2019-05-07 DIAGNOSIS — E1165 Type 2 diabetes mellitus with hyperglycemia: Secondary | ICD-10-CM | POA: Diagnosis not present

## 2019-05-08 DIAGNOSIS — N186 End stage renal disease: Secondary | ICD-10-CM | POA: Diagnosis not present

## 2019-05-08 DIAGNOSIS — D631 Anemia in chronic kidney disease: Secondary | ICD-10-CM | POA: Diagnosis not present

## 2019-05-08 DIAGNOSIS — D689 Coagulation defect, unspecified: Secondary | ICD-10-CM | POA: Diagnosis not present

## 2019-05-08 DIAGNOSIS — Z992 Dependence on renal dialysis: Secondary | ICD-10-CM | POA: Diagnosis not present

## 2019-05-08 DIAGNOSIS — Z4931 Encounter for adequacy testing for hemodialysis: Secondary | ICD-10-CM | POA: Diagnosis not present

## 2019-05-08 DIAGNOSIS — N2581 Secondary hyperparathyroidism of renal origin: Secondary | ICD-10-CM | POA: Diagnosis not present

## 2019-05-08 DIAGNOSIS — S41102A Unspecified open wound of left upper arm, initial encounter: Secondary | ICD-10-CM | POA: Diagnosis not present

## 2019-05-11 DIAGNOSIS — D689 Coagulation defect, unspecified: Secondary | ICD-10-CM | POA: Diagnosis not present

## 2019-05-11 DIAGNOSIS — S41102A Unspecified open wound of left upper arm, initial encounter: Secondary | ICD-10-CM | POA: Diagnosis not present

## 2019-05-11 DIAGNOSIS — D631 Anemia in chronic kidney disease: Secondary | ICD-10-CM | POA: Diagnosis not present

## 2019-05-11 DIAGNOSIS — Z4931 Encounter for adequacy testing for hemodialysis: Secondary | ICD-10-CM | POA: Diagnosis not present

## 2019-05-11 DIAGNOSIS — Z992 Dependence on renal dialysis: Secondary | ICD-10-CM | POA: Diagnosis not present

## 2019-05-11 DIAGNOSIS — N2581 Secondary hyperparathyroidism of renal origin: Secondary | ICD-10-CM | POA: Diagnosis not present

## 2019-05-11 DIAGNOSIS — N186 End stage renal disease: Secondary | ICD-10-CM | POA: Diagnosis not present

## 2019-05-12 DIAGNOSIS — G6289 Other specified polyneuropathies: Secondary | ICD-10-CM | POA: Diagnosis not present

## 2019-05-12 DIAGNOSIS — R1310 Dysphagia, unspecified: Secondary | ICD-10-CM | POA: Diagnosis not present

## 2019-05-12 DIAGNOSIS — I132 Hypertensive heart and chronic kidney disease with heart failure and with stage 5 chronic kidney disease, or end stage renal disease: Secondary | ICD-10-CM | POA: Diagnosis not present

## 2019-05-12 DIAGNOSIS — I5030 Unspecified diastolic (congestive) heart failure: Secondary | ICD-10-CM | POA: Diagnosis not present

## 2019-05-12 DIAGNOSIS — G1222 Progressive bulbar palsy: Secondary | ICD-10-CM | POA: Diagnosis not present

## 2019-05-12 DIAGNOSIS — E1122 Type 2 diabetes mellitus with diabetic chronic kidney disease: Secondary | ICD-10-CM | POA: Diagnosis not present

## 2019-05-13 DIAGNOSIS — D689 Coagulation defect, unspecified: Secondary | ICD-10-CM | POA: Diagnosis not present

## 2019-05-13 DIAGNOSIS — D631 Anemia in chronic kidney disease: Secondary | ICD-10-CM | POA: Diagnosis not present

## 2019-05-13 DIAGNOSIS — N2581 Secondary hyperparathyroidism of renal origin: Secondary | ICD-10-CM | POA: Diagnosis not present

## 2019-05-13 DIAGNOSIS — N186 End stage renal disease: Secondary | ICD-10-CM | POA: Diagnosis not present

## 2019-05-13 DIAGNOSIS — Z992 Dependence on renal dialysis: Secondary | ICD-10-CM | POA: Diagnosis not present

## 2019-05-13 DIAGNOSIS — E1122 Type 2 diabetes mellitus with diabetic chronic kidney disease: Secondary | ICD-10-CM | POA: Diagnosis not present

## 2019-05-13 DIAGNOSIS — Z4931 Encounter for adequacy testing for hemodialysis: Secondary | ICD-10-CM | POA: Diagnosis not present

## 2019-05-13 DIAGNOSIS — S41102A Unspecified open wound of left upper arm, initial encounter: Secondary | ICD-10-CM | POA: Diagnosis not present

## 2019-05-15 DIAGNOSIS — N186 End stage renal disease: Secondary | ICD-10-CM | POA: Diagnosis not present

## 2019-05-15 DIAGNOSIS — D631 Anemia in chronic kidney disease: Secondary | ICD-10-CM | POA: Diagnosis not present

## 2019-05-15 DIAGNOSIS — Z992 Dependence on renal dialysis: Secondary | ICD-10-CM | POA: Diagnosis not present

## 2019-05-15 DIAGNOSIS — N2581 Secondary hyperparathyroidism of renal origin: Secondary | ICD-10-CM | POA: Diagnosis not present

## 2019-05-15 DIAGNOSIS — Z4931 Encounter for adequacy testing for hemodialysis: Secondary | ICD-10-CM | POA: Diagnosis not present

## 2019-05-15 DIAGNOSIS — D689 Coagulation defect, unspecified: Secondary | ICD-10-CM | POA: Diagnosis not present

## 2019-05-18 DIAGNOSIS — Z4931 Encounter for adequacy testing for hemodialysis: Secondary | ICD-10-CM | POA: Diagnosis not present

## 2019-05-18 DIAGNOSIS — D631 Anemia in chronic kidney disease: Secondary | ICD-10-CM | POA: Diagnosis not present

## 2019-05-18 DIAGNOSIS — D689 Coagulation defect, unspecified: Secondary | ICD-10-CM | POA: Diagnosis not present

## 2019-05-18 DIAGNOSIS — N2581 Secondary hyperparathyroidism of renal origin: Secondary | ICD-10-CM | POA: Diagnosis not present

## 2019-05-18 DIAGNOSIS — Z992 Dependence on renal dialysis: Secondary | ICD-10-CM | POA: Diagnosis not present

## 2019-05-18 DIAGNOSIS — N186 End stage renal disease: Secondary | ICD-10-CM | POA: Diagnosis not present

## 2019-05-19 DIAGNOSIS — E1122 Type 2 diabetes mellitus with diabetic chronic kidney disease: Secondary | ICD-10-CM | POA: Diagnosis not present

## 2019-05-19 DIAGNOSIS — I5030 Unspecified diastolic (congestive) heart failure: Secondary | ICD-10-CM | POA: Diagnosis not present

## 2019-05-19 DIAGNOSIS — I132 Hypertensive heart and chronic kidney disease with heart failure and with stage 5 chronic kidney disease, or end stage renal disease: Secondary | ICD-10-CM | POA: Diagnosis not present

## 2019-05-19 DIAGNOSIS — E1140 Type 2 diabetes mellitus with diabetic neuropathy, unspecified: Secondary | ICD-10-CM | POA: Diagnosis not present

## 2019-05-19 DIAGNOSIS — I5033 Acute on chronic diastolic (congestive) heart failure: Secondary | ICD-10-CM | POA: Diagnosis not present

## 2019-05-19 DIAGNOSIS — G1222 Progressive bulbar palsy: Secondary | ICD-10-CM | POA: Diagnosis not present

## 2019-05-19 DIAGNOSIS — G6289 Other specified polyneuropathies: Secondary | ICD-10-CM | POA: Diagnosis not present

## 2019-05-19 DIAGNOSIS — R1310 Dysphagia, unspecified: Secondary | ICD-10-CM | POA: Diagnosis not present

## 2019-05-19 DIAGNOSIS — R062 Wheezing: Secondary | ICD-10-CM | POA: Diagnosis not present

## 2019-05-20 DIAGNOSIS — N2581 Secondary hyperparathyroidism of renal origin: Secondary | ICD-10-CM | POA: Diagnosis not present

## 2019-05-20 DIAGNOSIS — Z992 Dependence on renal dialysis: Secondary | ICD-10-CM | POA: Diagnosis not present

## 2019-05-20 DIAGNOSIS — D631 Anemia in chronic kidney disease: Secondary | ICD-10-CM | POA: Diagnosis not present

## 2019-05-20 DIAGNOSIS — D689 Coagulation defect, unspecified: Secondary | ICD-10-CM | POA: Diagnosis not present

## 2019-05-20 DIAGNOSIS — Z4931 Encounter for adequacy testing for hemodialysis: Secondary | ICD-10-CM | POA: Diagnosis not present

## 2019-05-20 DIAGNOSIS — N186 End stage renal disease: Secondary | ICD-10-CM | POA: Diagnosis not present

## 2019-05-21 DIAGNOSIS — R609 Edema, unspecified: Secondary | ICD-10-CM | POA: Diagnosis not present

## 2019-05-21 DIAGNOSIS — Z993 Dependence on wheelchair: Secondary | ICD-10-CM | POA: Diagnosis not present

## 2019-05-21 DIAGNOSIS — G4730 Sleep apnea, unspecified: Secondary | ICD-10-CM | POA: Diagnosis not present

## 2019-05-21 DIAGNOSIS — Z992 Dependence on renal dialysis: Secondary | ICD-10-CM | POA: Diagnosis not present

## 2019-05-21 DIAGNOSIS — I132 Hypertensive heart and chronic kidney disease with heart failure and with stage 5 chronic kidney disease, or end stage renal disease: Secondary | ICD-10-CM | POA: Diagnosis not present

## 2019-05-21 DIAGNOSIS — R1310 Dysphagia, unspecified: Secondary | ICD-10-CM | POA: Diagnosis not present

## 2019-05-21 DIAGNOSIS — Z905 Acquired absence of kidney: Secondary | ICD-10-CM | POA: Diagnosis not present

## 2019-05-21 DIAGNOSIS — I5030 Unspecified diastolic (congestive) heart failure: Secondary | ICD-10-CM | POA: Diagnosis not present

## 2019-05-21 DIAGNOSIS — R29898 Other symptoms and signs involving the musculoskeletal system: Secondary | ICD-10-CM | POA: Diagnosis not present

## 2019-05-21 DIAGNOSIS — Z79899 Other long term (current) drug therapy: Secondary | ICD-10-CM | POA: Diagnosis not present

## 2019-05-21 DIAGNOSIS — J449 Chronic obstructive pulmonary disease, unspecified: Secondary | ICD-10-CM | POA: Diagnosis not present

## 2019-05-21 DIAGNOSIS — Z887 Allergy status to serum and vaccine status: Secondary | ICD-10-CM | POA: Diagnosis not present

## 2019-05-21 DIAGNOSIS — Z85528 Personal history of other malignant neoplasm of kidney: Secondary | ICD-10-CM | POA: Diagnosis not present

## 2019-05-21 DIAGNOSIS — G6189 Other inflammatory polyneuropathies: Secondary | ICD-10-CM | POA: Diagnosis not present

## 2019-05-21 DIAGNOSIS — Z880 Allergy status to penicillin: Secondary | ICD-10-CM | POA: Diagnosis not present

## 2019-05-21 DIAGNOSIS — E1122 Type 2 diabetes mellitus with diabetic chronic kidney disease: Secondary | ICD-10-CM | POA: Diagnosis not present

## 2019-05-21 DIAGNOSIS — I4891 Unspecified atrial fibrillation: Secondary | ICD-10-CM | POA: Diagnosis not present

## 2019-05-21 DIAGNOSIS — Z7409 Other reduced mobility: Secondary | ICD-10-CM | POA: Diagnosis not present

## 2019-05-21 DIAGNOSIS — G6289 Other specified polyneuropathies: Secondary | ICD-10-CM | POA: Diagnosis not present

## 2019-05-21 DIAGNOSIS — Z794 Long term (current) use of insulin: Secondary | ICD-10-CM | POA: Diagnosis not present

## 2019-05-21 DIAGNOSIS — I120 Hypertensive chronic kidney disease with stage 5 chronic kidney disease or end stage renal disease: Secondary | ICD-10-CM | POA: Diagnosis not present

## 2019-05-21 DIAGNOSIS — G1222 Progressive bulbar palsy: Secondary | ICD-10-CM | POA: Diagnosis not present

## 2019-05-21 DIAGNOSIS — N186 End stage renal disease: Secondary | ICD-10-CM | POA: Diagnosis not present

## 2019-05-21 DIAGNOSIS — R2681 Unsteadiness on feet: Secondary | ICD-10-CM | POA: Diagnosis not present

## 2019-05-21 DIAGNOSIS — G610 Guillain-Barre syndrome: Secondary | ICD-10-CM | POA: Diagnosis not present

## 2019-05-21 DIAGNOSIS — M6281 Muscle weakness (generalized): Secondary | ICD-10-CM | POA: Diagnosis not present

## 2019-05-21 DIAGNOSIS — Z8551 Personal history of malignant neoplasm of bladder: Secondary | ICD-10-CM | POA: Diagnosis not present

## 2019-05-21 DIAGNOSIS — Z885 Allergy status to narcotic agent status: Secondary | ICD-10-CM | POA: Diagnosis not present

## 2019-05-21 DIAGNOSIS — Z87891 Personal history of nicotine dependence: Secondary | ICD-10-CM | POA: Diagnosis not present

## 2019-05-22 DIAGNOSIS — N2581 Secondary hyperparathyroidism of renal origin: Secondary | ICD-10-CM | POA: Diagnosis not present

## 2019-05-22 DIAGNOSIS — D689 Coagulation defect, unspecified: Secondary | ICD-10-CM | POA: Diagnosis not present

## 2019-05-22 DIAGNOSIS — N186 End stage renal disease: Secondary | ICD-10-CM | POA: Diagnosis not present

## 2019-05-22 DIAGNOSIS — Z992 Dependence on renal dialysis: Secondary | ICD-10-CM | POA: Diagnosis not present

## 2019-05-22 DIAGNOSIS — Z4931 Encounter for adequacy testing for hemodialysis: Secondary | ICD-10-CM | POA: Diagnosis not present

## 2019-05-22 DIAGNOSIS — D631 Anemia in chronic kidney disease: Secondary | ICD-10-CM | POA: Diagnosis not present

## 2019-05-25 DIAGNOSIS — Z4931 Encounter for adequacy testing for hemodialysis: Secondary | ICD-10-CM | POA: Diagnosis not present

## 2019-05-25 DIAGNOSIS — N2581 Secondary hyperparathyroidism of renal origin: Secondary | ICD-10-CM | POA: Diagnosis not present

## 2019-05-25 DIAGNOSIS — D631 Anemia in chronic kidney disease: Secondary | ICD-10-CM | POA: Diagnosis not present

## 2019-05-25 DIAGNOSIS — D689 Coagulation defect, unspecified: Secondary | ICD-10-CM | POA: Diagnosis not present

## 2019-05-25 DIAGNOSIS — Z992 Dependence on renal dialysis: Secondary | ICD-10-CM | POA: Diagnosis not present

## 2019-05-25 DIAGNOSIS — N186 End stage renal disease: Secondary | ICD-10-CM | POA: Diagnosis not present

## 2019-05-26 DIAGNOSIS — G6289 Other specified polyneuropathies: Secondary | ICD-10-CM | POA: Diagnosis not present

## 2019-05-26 DIAGNOSIS — E1122 Type 2 diabetes mellitus with diabetic chronic kidney disease: Secondary | ICD-10-CM | POA: Diagnosis not present

## 2019-05-26 DIAGNOSIS — R1310 Dysphagia, unspecified: Secondary | ICD-10-CM | POA: Diagnosis not present

## 2019-05-26 DIAGNOSIS — I5030 Unspecified diastolic (congestive) heart failure: Secondary | ICD-10-CM | POA: Diagnosis not present

## 2019-05-26 DIAGNOSIS — I132 Hypertensive heart and chronic kidney disease with heart failure and with stage 5 chronic kidney disease, or end stage renal disease: Secondary | ICD-10-CM | POA: Diagnosis not present

## 2019-05-26 DIAGNOSIS — G1222 Progressive bulbar palsy: Secondary | ICD-10-CM | POA: Diagnosis not present

## 2019-05-27 DIAGNOSIS — Z992 Dependence on renal dialysis: Secondary | ICD-10-CM | POA: Diagnosis not present

## 2019-05-27 DIAGNOSIS — G610 Guillain-Barre syndrome: Secondary | ICD-10-CM | POA: Diagnosis not present

## 2019-05-27 DIAGNOSIS — R8290 Unspecified abnormal findings in urine: Secondary | ICD-10-CM | POA: Diagnosis not present

## 2019-05-27 DIAGNOSIS — N390 Urinary tract infection, site not specified: Secondary | ICD-10-CM | POA: Diagnosis not present

## 2019-05-27 DIAGNOSIS — Z8551 Personal history of malignant neoplasm of bladder: Secondary | ICD-10-CM | POA: Diagnosis not present

## 2019-05-27 DIAGNOSIS — N2581 Secondary hyperparathyroidism of renal origin: Secondary | ICD-10-CM | POA: Diagnosis not present

## 2019-05-27 DIAGNOSIS — N186 End stage renal disease: Secondary | ICD-10-CM | POA: Diagnosis not present

## 2019-05-27 DIAGNOSIS — D631 Anemia in chronic kidney disease: Secondary | ICD-10-CM | POA: Diagnosis not present

## 2019-05-27 DIAGNOSIS — Z4931 Encounter for adequacy testing for hemodialysis: Secondary | ICD-10-CM | POA: Diagnosis not present

## 2019-05-27 DIAGNOSIS — C679 Malignant neoplasm of bladder, unspecified: Secondary | ICD-10-CM | POA: Diagnosis not present

## 2019-05-27 DIAGNOSIS — Z08 Encounter for follow-up examination after completed treatment for malignant neoplasm: Secondary | ICD-10-CM | POA: Diagnosis not present

## 2019-05-27 DIAGNOSIS — J9610 Chronic respiratory failure, unspecified whether with hypoxia or hypercapnia: Secondary | ICD-10-CM | POA: Diagnosis not present

## 2019-05-27 DIAGNOSIS — Z905 Acquired absence of kidney: Secondary | ICD-10-CM | POA: Diagnosis not present

## 2019-05-27 DIAGNOSIS — D689 Coagulation defect, unspecified: Secondary | ICD-10-CM | POA: Diagnosis not present

## 2019-05-28 DIAGNOSIS — I5030 Unspecified diastolic (congestive) heart failure: Secondary | ICD-10-CM | POA: Diagnosis not present

## 2019-05-28 DIAGNOSIS — R1310 Dysphagia, unspecified: Secondary | ICD-10-CM | POA: Diagnosis not present

## 2019-05-28 DIAGNOSIS — L600 Ingrowing nail: Secondary | ICD-10-CM | POA: Diagnosis not present

## 2019-05-28 DIAGNOSIS — I132 Hypertensive heart and chronic kidney disease with heart failure and with stage 5 chronic kidney disease, or end stage renal disease: Secondary | ICD-10-CM | POA: Diagnosis not present

## 2019-05-28 DIAGNOSIS — E1151 Type 2 diabetes mellitus with diabetic peripheral angiopathy without gangrene: Secondary | ICD-10-CM | POA: Diagnosis not present

## 2019-05-28 DIAGNOSIS — E1122 Type 2 diabetes mellitus with diabetic chronic kidney disease: Secondary | ICD-10-CM | POA: Diagnosis not present

## 2019-05-28 DIAGNOSIS — G6289 Other specified polyneuropathies: Secondary | ICD-10-CM | POA: Diagnosis not present

## 2019-05-28 DIAGNOSIS — G1222 Progressive bulbar palsy: Secondary | ICD-10-CM | POA: Diagnosis not present

## 2019-05-29 DIAGNOSIS — D631 Anemia in chronic kidney disease: Secondary | ICD-10-CM | POA: Diagnosis not present

## 2019-05-29 DIAGNOSIS — D689 Coagulation defect, unspecified: Secondary | ICD-10-CM | POA: Diagnosis not present

## 2019-05-29 DIAGNOSIS — N186 End stage renal disease: Secondary | ICD-10-CM | POA: Diagnosis not present

## 2019-05-29 DIAGNOSIS — N2581 Secondary hyperparathyroidism of renal origin: Secondary | ICD-10-CM | POA: Diagnosis not present

## 2019-05-29 DIAGNOSIS — Z992 Dependence on renal dialysis: Secondary | ICD-10-CM | POA: Diagnosis not present

## 2019-05-29 DIAGNOSIS — Z4931 Encounter for adequacy testing for hemodialysis: Secondary | ICD-10-CM | POA: Diagnosis not present

## 2019-06-01 DIAGNOSIS — N186 End stage renal disease: Secondary | ICD-10-CM | POA: Diagnosis not present

## 2019-06-01 DIAGNOSIS — D631 Anemia in chronic kidney disease: Secondary | ICD-10-CM | POA: Diagnosis not present

## 2019-06-01 DIAGNOSIS — N2581 Secondary hyperparathyroidism of renal origin: Secondary | ICD-10-CM | POA: Diagnosis not present

## 2019-06-01 DIAGNOSIS — R202 Paresthesia of skin: Secondary | ICD-10-CM | POA: Diagnosis not present

## 2019-06-01 DIAGNOSIS — Z992 Dependence on renal dialysis: Secondary | ICD-10-CM | POA: Diagnosis not present

## 2019-06-01 DIAGNOSIS — S41102D Unspecified open wound of left upper arm, subsequent encounter: Secondary | ICD-10-CM | POA: Diagnosis not present

## 2019-06-01 DIAGNOSIS — D689 Coagulation defect, unspecified: Secondary | ICD-10-CM | POA: Diagnosis not present

## 2019-06-01 DIAGNOSIS — Z4931 Encounter for adequacy testing for hemodialysis: Secondary | ICD-10-CM | POA: Diagnosis not present

## 2019-06-03 DIAGNOSIS — N2581 Secondary hyperparathyroidism of renal origin: Secondary | ICD-10-CM | POA: Diagnosis not present

## 2019-06-03 DIAGNOSIS — D631 Anemia in chronic kidney disease: Secondary | ICD-10-CM | POA: Diagnosis not present

## 2019-06-03 DIAGNOSIS — Z992 Dependence on renal dialysis: Secondary | ICD-10-CM | POA: Diagnosis not present

## 2019-06-03 DIAGNOSIS — D689 Coagulation defect, unspecified: Secondary | ICD-10-CM | POA: Diagnosis not present

## 2019-06-03 DIAGNOSIS — N186 End stage renal disease: Secondary | ICD-10-CM | POA: Diagnosis not present

## 2019-06-03 DIAGNOSIS — Z4931 Encounter for adequacy testing for hemodialysis: Secondary | ICD-10-CM | POA: Diagnosis not present

## 2019-06-05 DIAGNOSIS — D689 Coagulation defect, unspecified: Secondary | ICD-10-CM | POA: Diagnosis not present

## 2019-06-05 DIAGNOSIS — N186 End stage renal disease: Secondary | ICD-10-CM | POA: Diagnosis not present

## 2019-06-05 DIAGNOSIS — Z4931 Encounter for adequacy testing for hemodialysis: Secondary | ICD-10-CM | POA: Diagnosis not present

## 2019-06-05 DIAGNOSIS — Z992 Dependence on renal dialysis: Secondary | ICD-10-CM | POA: Diagnosis not present

## 2019-06-05 DIAGNOSIS — D631 Anemia in chronic kidney disease: Secondary | ICD-10-CM | POA: Diagnosis not present

## 2019-06-05 DIAGNOSIS — N2581 Secondary hyperparathyroidism of renal origin: Secondary | ICD-10-CM | POA: Diagnosis not present

## 2019-06-06 DIAGNOSIS — G6289 Other specified polyneuropathies: Secondary | ICD-10-CM | POA: Diagnosis not present

## 2019-06-06 DIAGNOSIS — I132 Hypertensive heart and chronic kidney disease with heart failure and with stage 5 chronic kidney disease, or end stage renal disease: Secondary | ICD-10-CM | POA: Diagnosis not present

## 2019-06-06 DIAGNOSIS — E1122 Type 2 diabetes mellitus with diabetic chronic kidney disease: Secondary | ICD-10-CM | POA: Diagnosis not present

## 2019-06-06 DIAGNOSIS — R1310 Dysphagia, unspecified: Secondary | ICD-10-CM | POA: Diagnosis not present

## 2019-06-06 DIAGNOSIS — I5030 Unspecified diastolic (congestive) heart failure: Secondary | ICD-10-CM | POA: Diagnosis not present

## 2019-06-06 DIAGNOSIS — G1222 Progressive bulbar palsy: Secondary | ICD-10-CM | POA: Diagnosis not present

## 2019-06-08 DIAGNOSIS — D689 Coagulation defect, unspecified: Secondary | ICD-10-CM | POA: Diagnosis not present

## 2019-06-08 DIAGNOSIS — Z4931 Encounter for adequacy testing for hemodialysis: Secondary | ICD-10-CM | POA: Diagnosis not present

## 2019-06-08 DIAGNOSIS — D631 Anemia in chronic kidney disease: Secondary | ICD-10-CM | POA: Diagnosis not present

## 2019-06-08 DIAGNOSIS — Z992 Dependence on renal dialysis: Secondary | ICD-10-CM | POA: Diagnosis not present

## 2019-06-08 DIAGNOSIS — N186 End stage renal disease: Secondary | ICD-10-CM | POA: Diagnosis not present

## 2019-06-08 DIAGNOSIS — N2581 Secondary hyperparathyroidism of renal origin: Secondary | ICD-10-CM | POA: Diagnosis not present

## 2019-06-10 DIAGNOSIS — Z4931 Encounter for adequacy testing for hemodialysis: Secondary | ICD-10-CM | POA: Diagnosis not present

## 2019-06-10 DIAGNOSIS — D689 Coagulation defect, unspecified: Secondary | ICD-10-CM | POA: Diagnosis not present

## 2019-06-10 DIAGNOSIS — Z992 Dependence on renal dialysis: Secondary | ICD-10-CM | POA: Diagnosis not present

## 2019-06-10 DIAGNOSIS — N186 End stage renal disease: Secondary | ICD-10-CM | POA: Diagnosis not present

## 2019-06-10 DIAGNOSIS — D631 Anemia in chronic kidney disease: Secondary | ICD-10-CM | POA: Diagnosis not present

## 2019-06-10 DIAGNOSIS — N2581 Secondary hyperparathyroidism of renal origin: Secondary | ICD-10-CM | POA: Diagnosis not present

## 2019-06-12 DIAGNOSIS — N2581 Secondary hyperparathyroidism of renal origin: Secondary | ICD-10-CM | POA: Diagnosis not present

## 2019-06-12 DIAGNOSIS — D689 Coagulation defect, unspecified: Secondary | ICD-10-CM | POA: Diagnosis not present

## 2019-06-12 DIAGNOSIS — E1122 Type 2 diabetes mellitus with diabetic chronic kidney disease: Secondary | ICD-10-CM | POA: Diagnosis not present

## 2019-06-12 DIAGNOSIS — N186 End stage renal disease: Secondary | ICD-10-CM | POA: Diagnosis not present

## 2019-06-12 DIAGNOSIS — Z992 Dependence on renal dialysis: Secondary | ICD-10-CM | POA: Diagnosis not present

## 2019-06-12 DIAGNOSIS — D631 Anemia in chronic kidney disease: Secondary | ICD-10-CM | POA: Diagnosis not present

## 2019-06-12 DIAGNOSIS — Z4931 Encounter for adequacy testing for hemodialysis: Secondary | ICD-10-CM | POA: Diagnosis not present

## 2019-06-12 IMAGING — CT CT CHEST W/O CM
2 of 3 series · 15 of 36 positions shown, 18 images · non-contrast
Comparison: 10/10/2016 chest radiograph.

CLINICAL DATA: Increasing shortness of breath. The the history of
single kidney and renal cell carcinoma. The

EXAM:
CT CHEST WITHOUT CONTRAST
TECHNIQUE: Multidetector CT imaging of the chest was performed following the
standard protocol without IV contrast.

[Series 2: thorax · axial · 0.67mm/px · z∈[-325,-71]mm · 12 of 149 slices shown, 15 images]
[im 11/149  mediastinal]
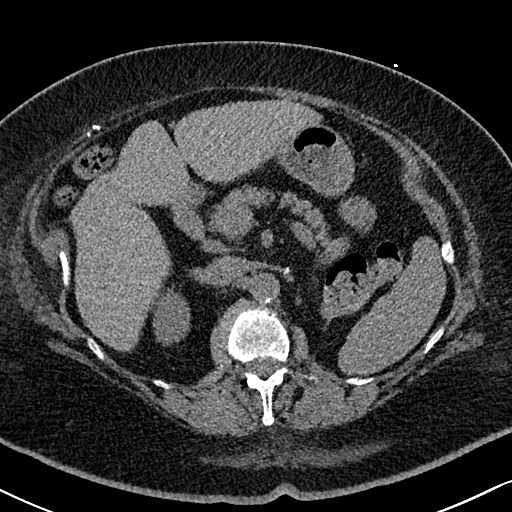
[im 11/149  lung]
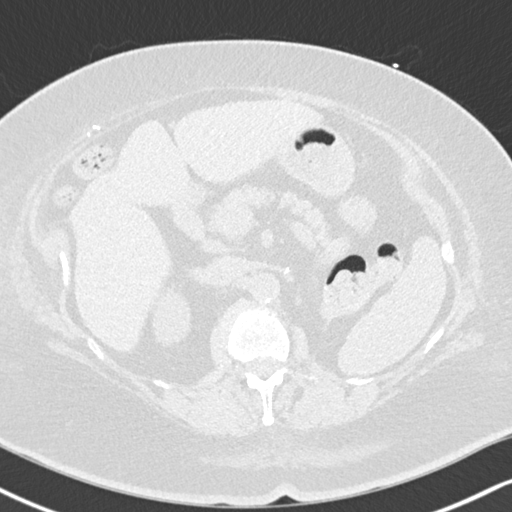
[im 22/149  lung]
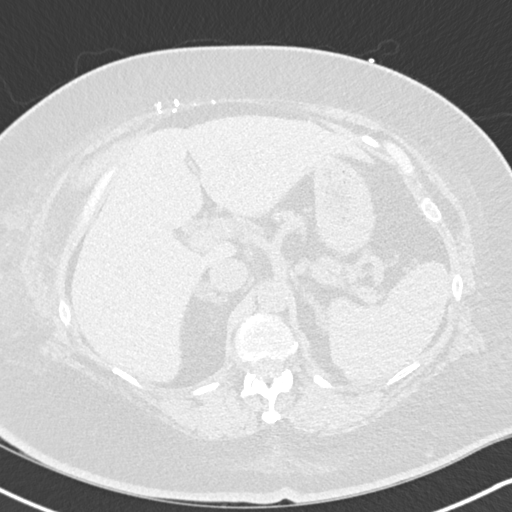
[im 33/149  lung]
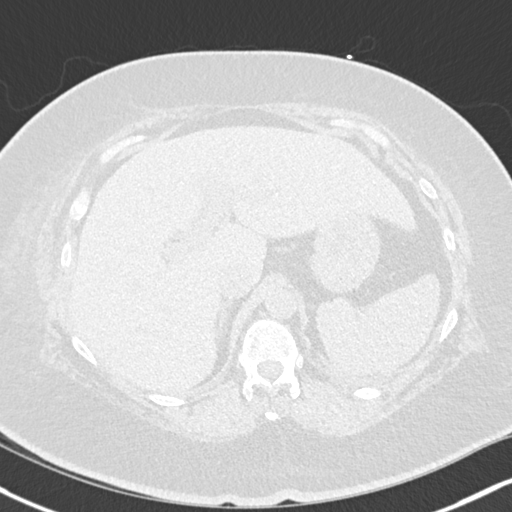
[im 44/149  lung]
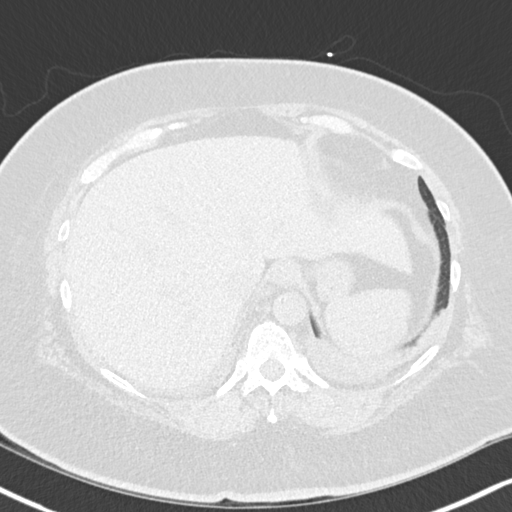
[im 55/149  mediastinal]
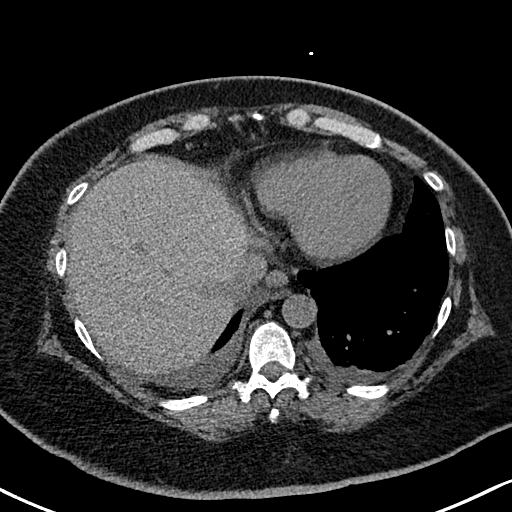
[im 55/149  lung]
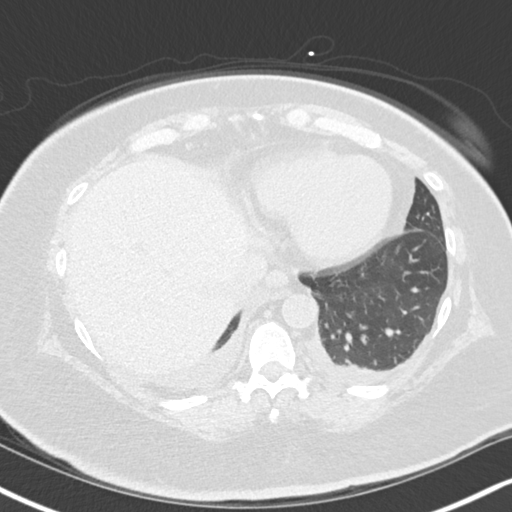
[im 66/149  lung]
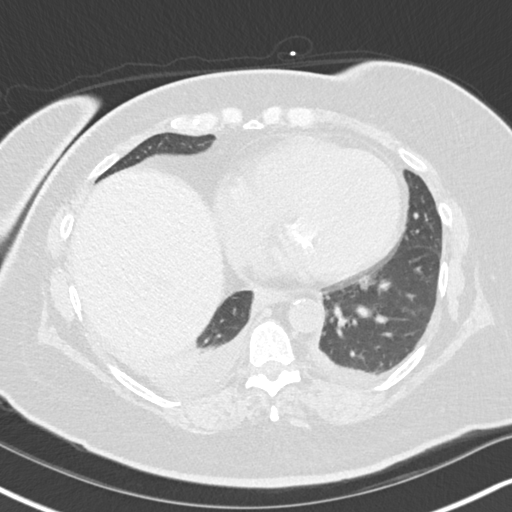
[im 83/149  lung]
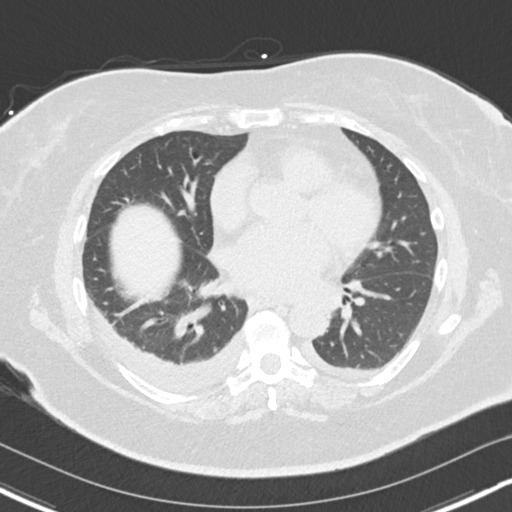
[im 94/149  lung]
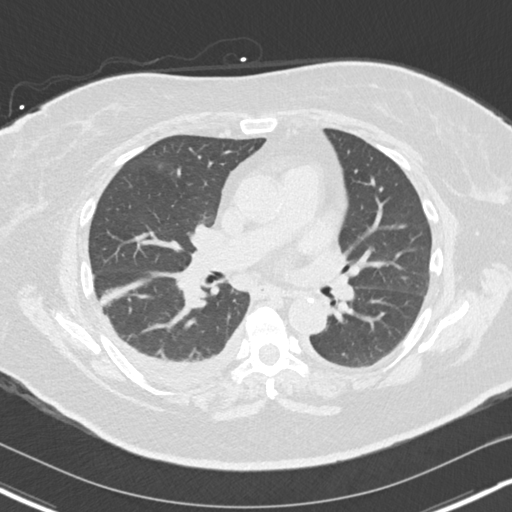
[im 105/149  mediastinal]
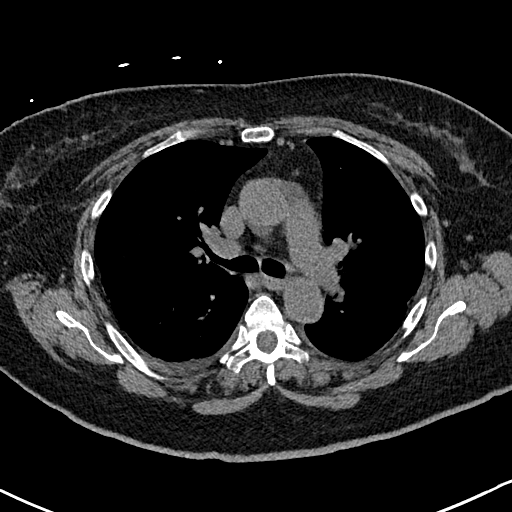
[im 105/149  lung]
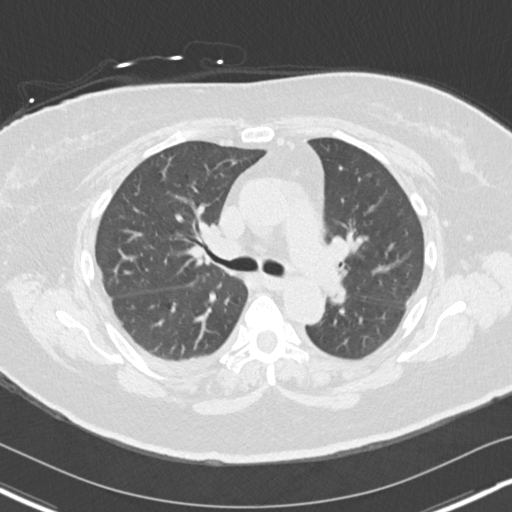
[im 116/149  lung]
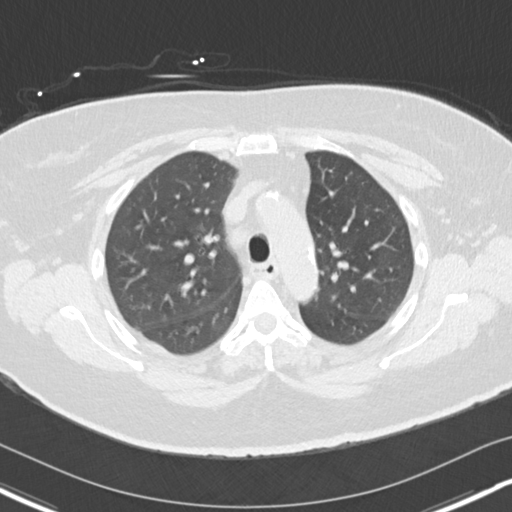
[im 127/149  lung]
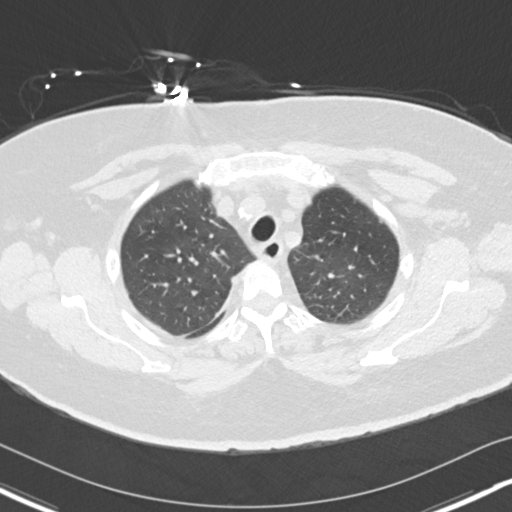
[im 138/149  lung]
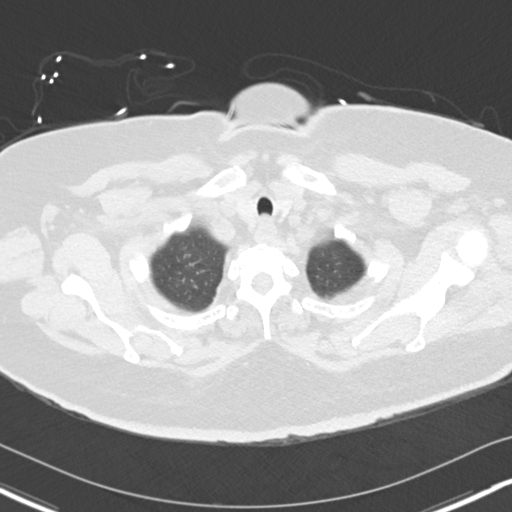

[Series 6: coronal · coronal · 0.59mm/px · 3 of 143 slices shown]
[im 29/143  lung]
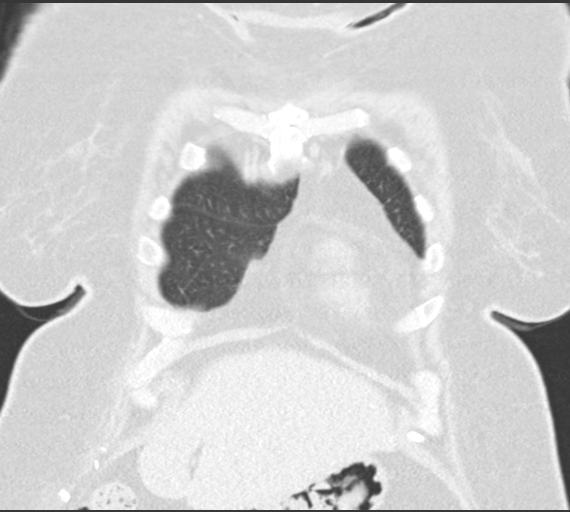
[im 57/143  lung]
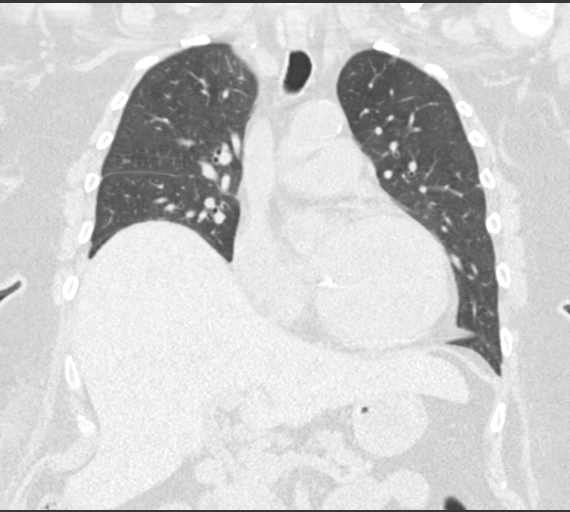
[im 86/143  lung]
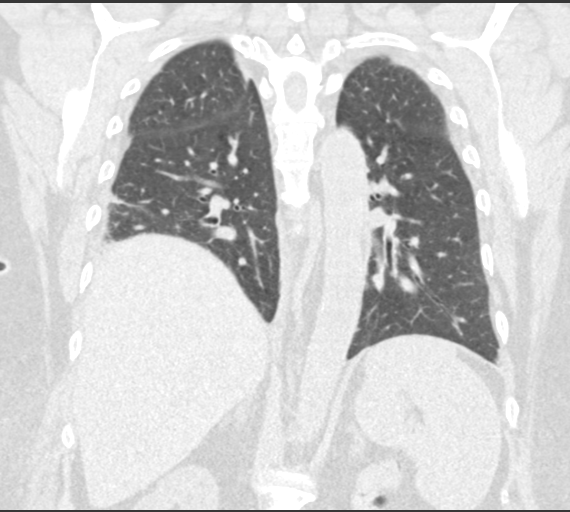

[15 of 36 positions shown; findings below may reference images not displayed]

FINDINGS: Cardiovascular: Normal heart size. No pericardial effusion. Dense
mitral annular calcification. No appreciable coronary artery
calcification. Normal caliber thoracic aorta with mild calcific
atherosclerosis.

Mediastinum/Nodes: No enlarged mediastinal or axillary lymph nodes.
Thyroid gland, trachea, and esophagus demonstrate no significant
findings.

Lungs/Pleura: Scattered pulmonary nodules measuring up to 9 mm in
the right upper lobe (series 5, image 41). Small bilateral pleural
effusions. No consolidation, pneumothorax, or findings of pulmonary
edema.

Upper Abdomen: 20 mm left and 15 mm right adrenal nodules measuring
-10 HU compatible with adenoma.

Musculoskeletal: No fracture is seen. Chronic postsurgical changes
within the right upper quadrant abdominal wall.
IMPRESSION: 1. Small bilateral pleural effusions.
2. No consolidation, pneumothorax, or findings of pulmonary edema.
3. Pulmonary nodules measuring up to 9 mm in the right upper lobe.
Given history of prior malignancy, short interval follow-up is
recommended to ensure stability.
4. Bilateral adrenal adenoma.

By: Ovidiu Baranowski M.D.

## 2019-06-21 IMAGING — DX DG CHEST 2V
2 series · 2 of 2 positions shown · non-contrast
Comparison: 10/10/2016

CLINICAL DATA: Increasing shortness of breath, recent
hospitalization, history CHF, effusion, hypertension,
hyperlipidemia, diabetes mellitus, renal cell carcinoma, oxygen
dependent, Guillain-Barre syndrome

EXAM:
CHEST  2 VIEW

[chest pa]
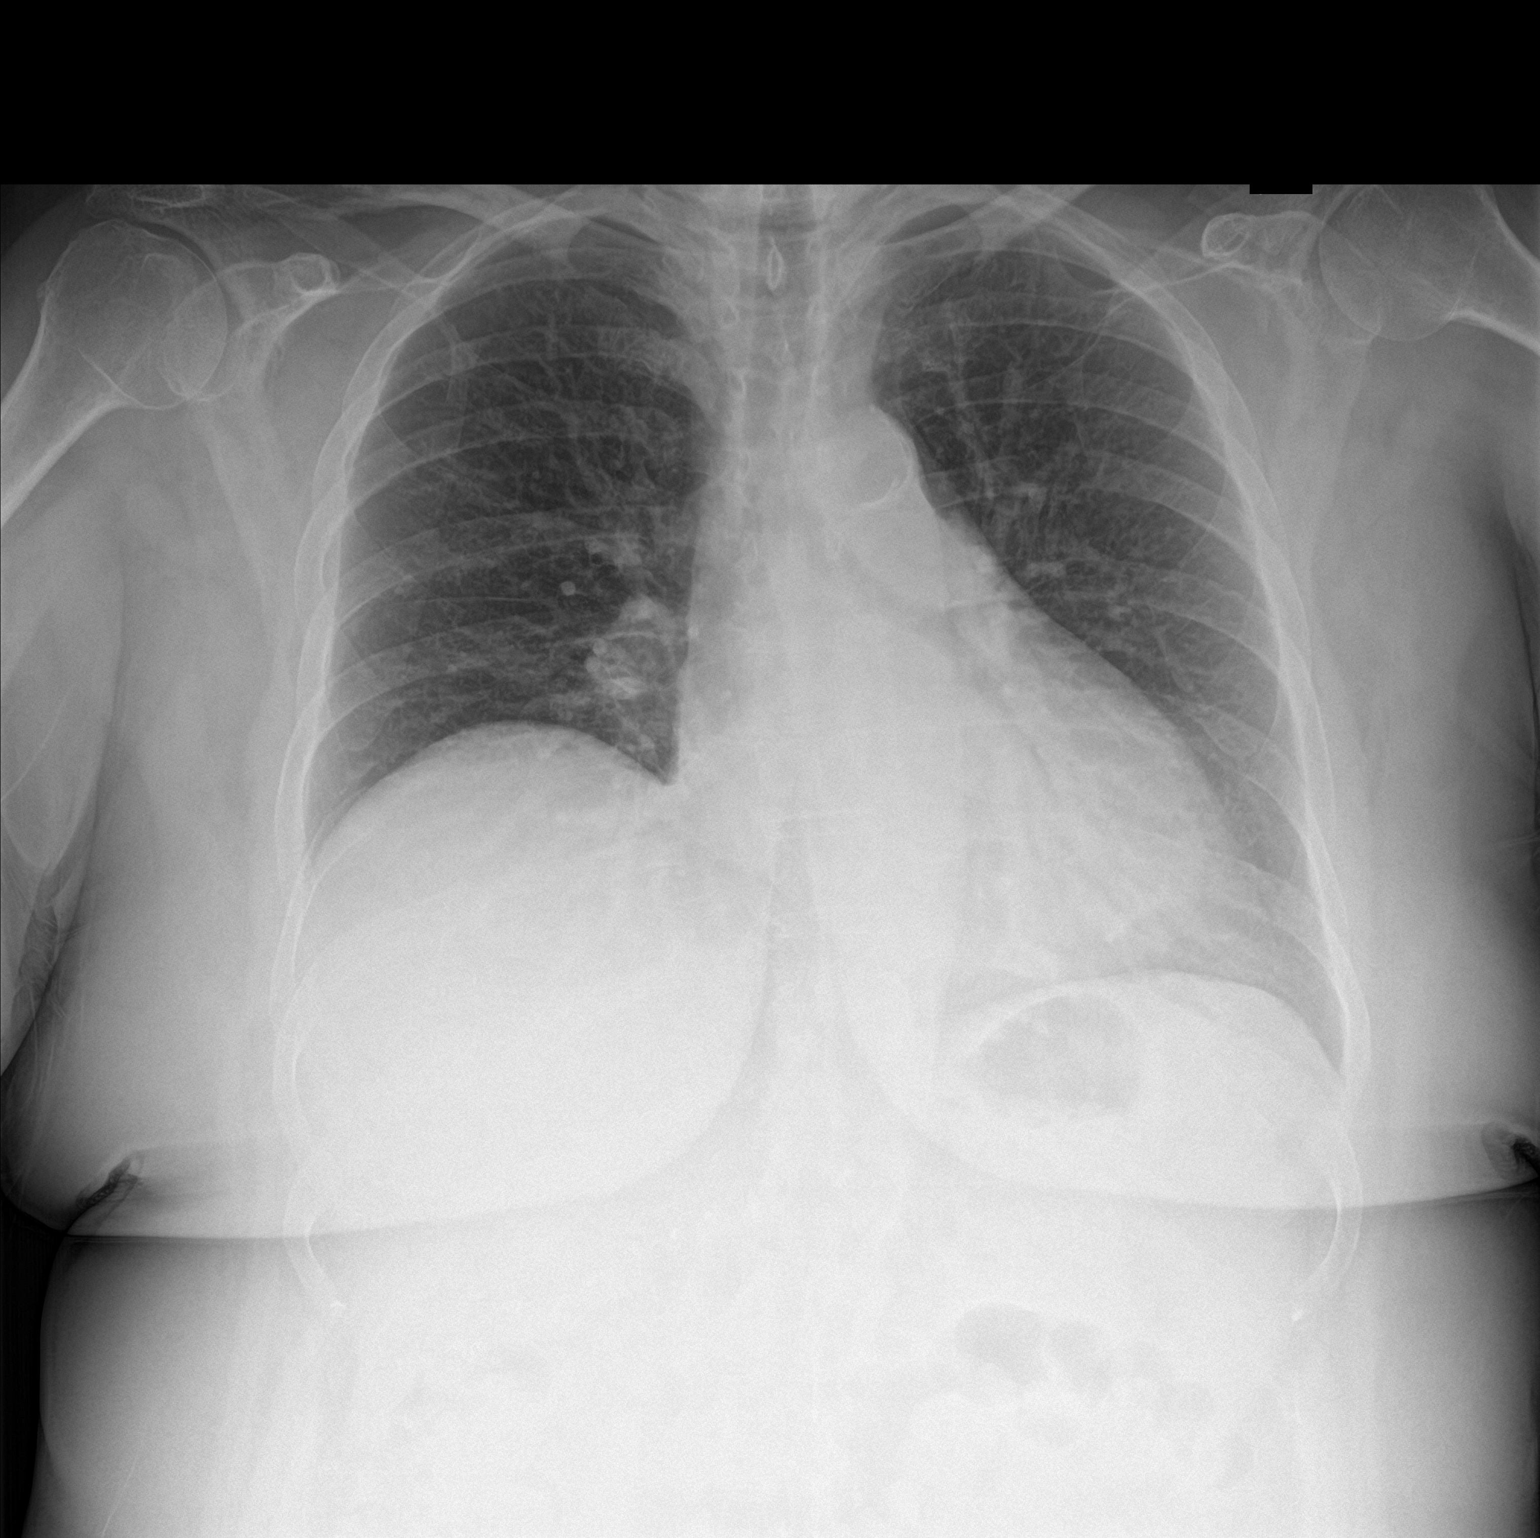

[chest lat]
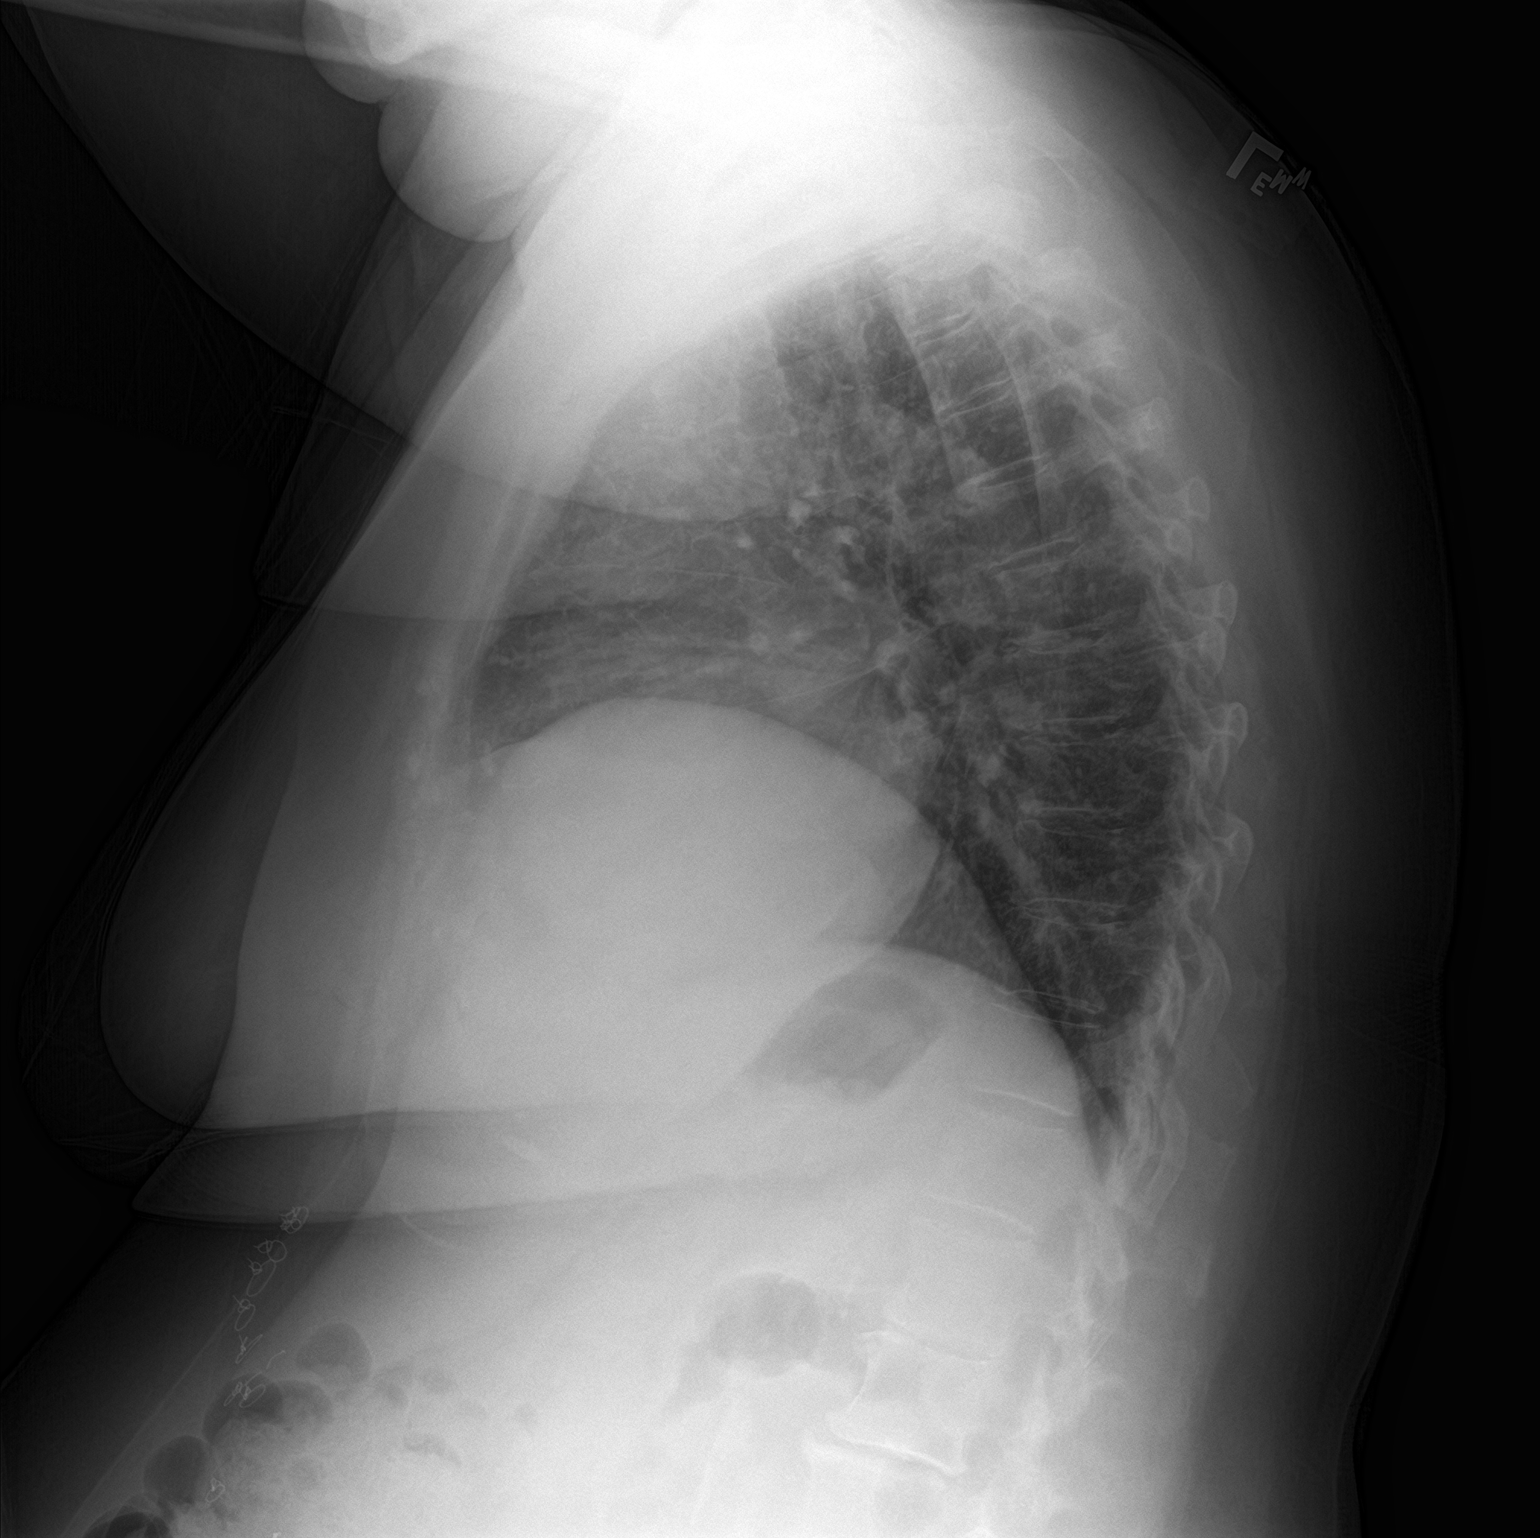

[2 of 2 positions shown; findings below may reference images not displayed]

FINDINGS: Enlargement of cardiac silhouette with pulmonary vascular
congestion.

Atherosclerotic calcification aorta.

Mediastinal contour stable.

Persistent elevation of RIGHT diaphragm.

Improved bibasilar atelectasis.

No definite acute infiltrate, pleural effusion or pneumothorax.

Bones demineralized.
IMPRESSION: Enlargement of cardiac silhouette with pulmonary vascular
congestion.

Improved bibasilar atelectasis.
